# Patient Record
Sex: Female | Born: 1950 | Race: White | Hispanic: No | Marital: Single | Smoking: Current some day smoker
Health system: Southern US, Community
[De-identification: ages and names within clinical notes are randomized; demographics above are authoritative.]

## PROBLEM LIST (undated history)

## (undated) DIAGNOSIS — K819 Cholecystitis, unspecified: Secondary | ICD-10-CM

## (undated) DIAGNOSIS — J189 Pneumonia, unspecified organism: Secondary | ICD-10-CM

## (undated) DIAGNOSIS — I739 Peripheral vascular disease, unspecified: Secondary | ICD-10-CM

## (undated) DIAGNOSIS — N390 Urinary tract infection, site not specified: Secondary | ICD-10-CM

## (undated) DIAGNOSIS — I499 Cardiac arrhythmia, unspecified: Secondary | ICD-10-CM

## (undated) DIAGNOSIS — Z923 Personal history of irradiation: Secondary | ICD-10-CM

## (undated) DIAGNOSIS — M1990 Unspecified osteoarthritis, unspecified site: Secondary | ICD-10-CM

## (undated) DIAGNOSIS — R0600 Dyspnea, unspecified: Secondary | ICD-10-CM

## (undated) DIAGNOSIS — K219 Gastro-esophageal reflux disease without esophagitis: Secondary | ICD-10-CM

## (undated) DIAGNOSIS — B019 Varicella without complication: Secondary | ICD-10-CM

## (undated) DIAGNOSIS — E785 Hyperlipidemia, unspecified: Secondary | ICD-10-CM

## (undated) DIAGNOSIS — E669 Obesity, unspecified: Secondary | ICD-10-CM

## (undated) DIAGNOSIS — Z170 Estrogen receptor positive status [ER+]: Principal | ICD-10-CM

## (undated) DIAGNOSIS — F329 Major depressive disorder, single episode, unspecified: Secondary | ICD-10-CM

## (undated) DIAGNOSIS — F32A Depression, unspecified: Secondary | ICD-10-CM

## (undated) DIAGNOSIS — C50412 Malignant neoplasm of upper-outer quadrant of left female breast: Principal | ICD-10-CM

## (undated) DIAGNOSIS — R319 Hematuria, unspecified: Secondary | ICD-10-CM

## (undated) HISTORY — DX: Urinary tract infection, site not specified: R31.9

## (undated) HISTORY — DX: Urinary tract infection, site not specified: N39.0

## (undated) HISTORY — PX: KNEE SURGERY: SHX244

## (undated) HISTORY — PX: OVARIAN CYST REMOVAL: SHX89

## (undated) HISTORY — PX: ABDOMINAL HYSTERECTOMY: SHX81

## (undated) HISTORY — DX: Pneumonia, unspecified organism: J18.9

## (undated) HISTORY — DX: Varicella without complication: B01.9

## (undated) HISTORY — DX: Personal history of irradiation: Z92.3

## (undated) HISTORY — PX: WRIST SURGERY: SHX841

## (undated) HISTORY — PX: TONSILLECTOMY: SUR1361

## (undated) HISTORY — DX: Depression, unspecified: F32.A

## (undated) HISTORY — DX: Peripheral vascular disease, unspecified: I73.9

## (undated) HISTORY — DX: Malignant neoplasm of upper-outer quadrant of left female breast: C50.412

## (undated) HISTORY — DX: Obesity, unspecified: E66.9

## (undated) HISTORY — DX: Estrogen receptor positive status (ER+): Z17.0

## (undated) HISTORY — PX: KNEE ARTHROSCOPY: SHX127

## (undated) HISTORY — DX: Major depressive disorder, single episode, unspecified: F32.9

## (undated) HISTORY — DX: Cholecystitis, unspecified: K81.9

## (undated) HISTORY — PX: ROTATOR CUFF REPAIR: SHX139

## (undated) NOTE — *Deleted (*Deleted)
[facility]  [phone] Fax:(336) [phone]     ID: Marcey Persad DOB: [birth year]  MR#: [id number]  ZHY#:[id number]  Patient Care Team: Theadore Nan, NP as PCP - General (Nurse Practitioner) Emelia Loron, MD as Consulting Physician (General Surgery) Magrinat, Valentino Hue, MD as Consulting Physician (Oncology) Antony Blackbird, MD as Consulting Physician (Radiation Oncology) Althea Charon, MD (Radiology) Armbruster, Willaim Rayas, MD as Consulting Physician (Gastroenterology) Yates Decamp, MD as Consulting Physician (Cardiology) Frederico Hamman, MD as Consulting Physician (Orthopedic Surgery) Oretha Milch, MD as Consulting Physician (Pulmonary Disease) Lourena Simmonds, RPH-CPP (Pharmacist) Kari Baars OTHER MD:  CHIEF COMPLAINT: Estrogen receptor positive breast cancer  CURRENT TREATMENT: Anastrozole   INTERVAL HISTORY: Fara returns today for follow-up of her estrogen receptor positive breast cancer accompanied.  She continues on anastrozole, with good tolerance.  Currently she is spending less than $20 for a 40-month supply.  She does not have problems with hot flashes or vaginal dryness issues.  She does have some urethral irritation and some urinary tract problems which have been taking care of with a couple of rounds of antibiotics through her primary care physician  Since her last visit, she does not appear to have had further mammographic studies (we contacted [facility] and the last data they have for her is from 2018).   REVIEW OF SYSTEMS: Mariena    BREAST CANCER HISTORY: From the original intake note:  Lumen had routine screening mammography at [facility] 09/17/2016. The breast density was category A. At the 12:00 location anteriorly in the left breast there was an area of linear calcifications measuring 0.3 cm. There was also an area of architectural distortion in the left breast upper outer quadrant. On 09/23/2016 the  patient underwent left diagnostic mammography and this confirmed an area of calcifications and architectural distortion. Accordingly on 09/24/2016 the patient underwent left breast ultrasonography which did not show any abnormality in the left breast or left axilla.  On 09/24/2016 the patient underwent biopsy of the left breast area of architectural distortion and this showed ([id number]) invasive ductal carcinoma, grade 1, estrogen receptor 100% positive, and progesterone receptor 100% positive, both with strong staining intensity, with an MIB-1 of 5%, and no HER-2 amplification, the signals ratio being 1.51 and the number per cell 2.65.  The patient's subsequent history is as detailed below.   PAST MEDICAL HISTORY: Past Medical History:  Diagnosis Date  . Acid reflux   . Acute heart failure (HCC) 10/17/2019  . AKI (acute kidney injury) (HCC) 10/17/2019  . Arthritis   . Breast cancer of upper-outer quadrant of right female breast (HCC) 11/19/2016  . Chicken pox   . Cholecystitis   . Chronic diastolic heart failure (HCC) 01/04/2020  . CKD (chronic kidney disease), stage III (HCC) 01/04/2020  . Depression   . Dyspnea   . History of radiation therapy 01/20/17-03/02/17   left breast was treated to 50.4 Gy in 18 fractions  . HLD (hyperlipidemia) 01/04/2020  . Hyperlipemia   . Irregular heart rate   . Malignant neoplasm of upper-outer quadrant of left breast in female, estrogen receptor positive (HCC) 09/30/2016  . Morbid obesity with BMI of 50.0-59.9, adult (HCC) 01/04/2020  . Obesity   . Peripheral vascular disease (HCC)   . Pneumonia   . Prediabetes 01/04/2020  . Urinary tract infection   . Urinary tract infection with hematuria     PAST SURGICAL HISTORY: Past Surgical History:  Procedure Laterality Date  . ABDOMINAL HYSTERECTOMY    .  BREAST LUMPECTOMY Left 11/19/2016   BREAST LUMPECTOMY WITH RADIOACTIVE SEED AND SENTINEL LYMPH NODE BIOPSY (Left)  . BREAST LUMPECTOMY WITH RADIOACTIVE SEED  AND SENTINEL LYMPH NODE BIOPSY Left 11/19/2016   Procedure: BREAST LUMPECTOMY WITH RADIOACTIVE SEED AND SENTINEL LYMPH NODE BIOPSY;  Surgeon: Emelia Loron, MD;  Location: [facility] OR;  Service: General;  Laterality: Left;  . CESAREAN SECTION    . CESAREAN SECTION     x 2  . CHOLECYSTECTOMY  2007  . KNEE ARTHROSCOPY     bilateral  . KNEE SURGERY Bilateral   . LOWER EXTREMITY ANGIOGRAM N/A 04/04/2014   Procedure: LOWER EXTREMITY ANGIOGRAM;  Surgeon: Pamella Pert, MD;  Location: [facility] CATH LAB;  Service: Cardiovascular;  Laterality: N/A;  . OVARIAN CYST REMOVAL    . RE-EXCISION OF BREAST LUMPECTOMY Left 12/12/2016   Procedure: RE-EXCISION OF LEFT BREAST LUMPECTOMY;  Surgeon: Emelia Loron, MD;  Location: [facility] OR;  Service: General;  Laterality: Left;  . ROTATOR CUFF REPAIR Right   . TONSILLECTOMY    . WRIST SURGERY Bilateral     FAMILY HISTORY Family History  Problem Relation Age of Onset  . Heart disease Mother   . Lung cancer Father   . Heart disease Father   . Heart disease Brother   The patient's father died at age 5 from lung cancer in the setting of tobacco abuse. The patient's mother died at the age of 55 from pneumonia. The patient has 3 brothers, 1 sister. There is no history of breast or ovarian cancer in the family.   GYNECOLOGIC HISTORY:  No LMP recorded. Patient has had a hysterectomy. Menarche age 77, first live birth age 61. The patient is GX P2. She underwent simple hysterectomy without salpingo-oophorectomy in 1986. She did not take hormone replacement. She did use oral contraceptives remotely for about 5 years, with no complications.   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 She now lives at home by herself along with her dog Dickie La. Her daughter Myer Peer worked as a Radiation protection practitioner for 30 years but retired 2020 and now works for [employer] in [place]. Daughter Raynelle Fanning lives in [place] and works as a Research scientist (medical). The  patient has no grandchildren. She is a Control and instrumentation engineer.    ADVANCED DIRECTIVES: Not in place. The patient was given the appropriate documents to complete and notarize at her discretion in course of her 10/01/2016 visit   HEALTH MAINTENANCE: Social History   Tobacco Use  . Smoking status: Former Smoker    Packs/day: 0.50    Years: 30.00    Pack years: 15.00    Types: E-cigarettes    Quit date: 11/02/2019    Years since quitting: 0.4  . Smokeless tobacco: Never Used  Vaping Use  . Vaping Use: Never used  Substance Use Topics  . Alcohol use: No  . Drug use: No     Colonoscopy:Never  PAP:  Bone density: Never   Allergies  Allergen Reactions  . Levaquin [Levofloxacin] Other (See Comments)    Body aches  . Sulfur Hives  . Statins     Current Outpatient Medications  Medication Sig Dispense Refill  . acetaminophen (TYLENOL) 325 MG tablet Take 2 tablets (650 mg total) by mouth every 4 (four) hours as needed for headache or mild pain.    Marland Kitchen albuterol (PROAIR HFA) 108 (90 Base) MCG/ACT inhaler Inhale 2 puffs into the lungs every 6 (six) hours as needed for wheezing or shortness of  breath. 18 g 3  . anastrozole (ARIMIDEX) 1 MG tablet TAKE 1 TABLET BY MOUTH EVERY DAY 90 tablet 0  . aspirin 81 MG chewable tablet Chew 81 mg by mouth daily.    . Bempedoic Acid-Ezetimibe (NEXLIZET) 180-10 MG TABS Take 1 tablet by mouth daily. 30 tablet 6  . Blood Glucose Monitoring Suppl (ACCU-CHEK GUIDE) w/Device KIT     . cephALEXin (KEFLEX) 500 MG capsule Take 1 capsule (500 mg total) by mouth 2 (two) times daily for 7 days. 14 capsule 0  . cilostazol (PLETAL) 100 MG tablet Take 1 tablet (100 mg total) by mouth 2 (two) times daily. 180 tablet 3  . diclofenac sodium (VOLTAREN) 1 % GEL Apply 4 g topically 4 (four) times daily as needed (knee and back pain). Knees and back for pain  5  . metoprolol succinate (TOPROL-XL) 25 MG 24 hr tablet Take 0.5 tablets (12.5 mg total) by mouth daily. 90 tablet 0  . Multiple  Vitamins-Minerals (PRESERVISION AREDS 2 PO) Take 1 tablet by mouth daily.     . pantoprazole (PROTONIX) 20 MG tablet Take 1 tablet (20 mg total) by mouth daily. 90 tablet 0  . potassium chloride SA (KLOR-CON M20) 20 MEQ tablet Take 1 tablet (20 mEq total) by mouth daily. 90 tablet 0  . torsemide (DEMADEX) 10 MG tablet TAKE 1 TABLET BY MOUTH TWICE A DAY 60 tablet 1  . vitamin B-12 (CYANOCOBALAMIN) 500 MCG tablet Take by mouth.     No current facility-administered medications for this visit.    OBJECTIVE: Morbidly obese white woman examined in a wheelchair  There were no vitals filed for this visit.   There is no height or weight on file to calculate BMI.    ECOG FS:2 - Symptomatic, <50% confined to bed  Sclerae unicteric, EOMs intact Wearing a mask No cervical or supraclavicular adenopathy Lungs no rales or rhonchi Heart regular rate and rhythm Abd soft, nontender, positive bowel sounds MSK no focal spinal tenderness, no upper extremity lymphedema Neuro: nonfocal, well oriented, appropriate affect Breasts:    {Sclerae unicteric, EOMs intact Wearing a mask No cervical or supraclavicular adenopathy Lungs no rales or rhonchi Heart regular rate and rhythm Abd soft, nontender, positive bowel sounds MSK no focal spinal tenderness, no upper extremity lymphedema Neuro: nonfocal, well oriented, appropriate affect Breasts: The right breast is unremarkable.  The left breast has undergone lumpectomy and radiation.  There is a minimal dimpling at the site of the scar, but there is no tenderness erythema or swelling.  There is no evidence of local recurrence.  Both axillae are benign.}   LAB RESULTS:  CMP     Component Value Date/Time   NA 143 02/14/2020 1024   NA 137 02/27/2017 1007   K 4.8 02/14/2020 1024   K 4.4 02/27/2017 1007   CL 102 02/14/2020 1024   CL 105 04/08/2014 0355   CO2 25 02/14/2020 1024   CO2 32 (H) 02/27/2017 1007   GLUCOSE 96 02/14/2020 1024   GLUCOSE 107 (H)  01/06/2020 0839   GLUCOSE 127 02/27/2017 1007   BUN 17 02/14/2020 1024   BUN 7.7 02/27/2017 1007   CREATININE 1.45 (H) 02/14/2020 1024   CREATININE 0.9 02/27/2017 1007   CALCIUM 9.6 02/14/2020 1024   CALCIUM 10.2 02/27/2017 1007   PROT 6.9 01/06/2020 0839   PROT 7.0 02/27/2017 1007   ALBUMIN 3.8 01/06/2020 0839   ALBUMIN 3.3 (L) 02/27/2017 1007   AST 12 01/06/2020 0839   AST  14 02/27/2017 1007   ALT 11 01/06/2020 0839   ALT 9 02/27/2017 1007   ALKPHOS 96 01/06/2020 0839   ALKPHOS 78 02/27/2017 1007   BILITOT 0.6 01/06/2020 0839   BILITOT 0.60 02/27/2017 1007   GFRNONAA 37 (L) 02/14/2020 1024   GFRNONAA 56 (L) 04/08/2014 0355   GFRAA 42 (L) 02/14/2020 1024   GFRAA >60 04/08/2014 0355    No results found for: TOTALPROTELP, ALBUMINELP, A1GS, A2GS, BETS, BETA2SER, GAMS, MSPIKE, SPEI  No results found for: Ron Parker, [facility]  Lab Results  Component Value Date   WBC 7.0 01/06/2020   NEUTROABS 4.2 01/06/2020   HGB 13.0 01/06/2020   HCT 39.3 01/06/2020   MCV 92.8 01/06/2020   PLT 246.0 01/06/2020      Chemistry      Component Value Date/Time   NA 143 02/14/2020 1024   NA 137 02/27/2017 1007   K 4.8 02/14/2020 1024   K 4.4 02/27/2017 1007   CL 102 02/14/2020 1024   CL 105 04/08/2014 0355   CO2 25 02/14/2020 1024   CO2 32 (H) 02/27/2017 1007   BUN 17 02/14/2020 1024   BUN 7.7 02/27/2017 1007   CREATININE 1.45 (H) 02/14/2020 1024   CREATININE 0.9 02/27/2017 1007      Component Value Date/Time   CALCIUM 9.6 02/14/2020 1024   CALCIUM 10.2 02/27/2017 1007   ALKPHOS 96 01/06/2020 0839   ALKPHOS 78 02/27/2017 1007   AST 12 01/06/2020 0839   AST 14 02/27/2017 1007   ALT 11 01/06/2020 0839   ALT 9 02/27/2017 1007   BILITOT 0.6 01/06/2020 0839   BILITOT 0.60 02/27/2017 1007     No results found for: LABCA2  No components found for: [id number]  No results for input(s): INR in the last 168 hours.  Urinalysis    Component Value Date/Time    COLORURINE YELLOW 10/17/2019 1429   APPEARANCEUR CLOUDY (A) 10/17/2019 1429   APPEARANCEUR Cloudy 04/07/2014 2005   LABSPEC 1.010 10/17/2019 1429   LABSPEC 1.006 04/07/2014 2005   PHURINE 6.0 10/17/2019 1429   GLUCOSEU NEGATIVE 10/17/2019 1429   GLUCOSEU Negative 04/07/2014 2005   HGBUR SMALL (A) 10/17/2019 1429   BILIRUBINUR neg 03/26/2020 1109   BILIRUBINUR Negative 04/07/2014 2005   KETONESUR NEGATIVE 10/17/2019 1429   PROTEINUR Negative 03/26/2020 1109   PROTEINUR NEGATIVE 10/17/2019 1429   UROBILINOGEN 2.0 (A) 03/26/2020 1109   NITRITE pos 03/26/2020 1109   NITRITE POSITIVE (A) 10/17/2019 1429   LEUKOCYTESUR Moderate (2+) (A) 03/26/2020 1109   LEUKOCYTESUR LARGE (A) 10/17/2019 1429   LEUKOCYTESUR 3+ 04/07/2014 2005    STUDIES: US RENAL  Result Date: 03/27/2020 CLINICAL DATA:  CKD stage 3 EXAM: RENAL / URINARY TRACT ULTRASOUND COMPLETE COMPARISON:  CT abdomen pelvis 03/14/2016 FINDINGS: Right Kidney: Renal measurements: 10.7 x 4.1 x 5.2 cm = volume: 121 mL. Echogenicity within normal limits. No mass or hydronephrosis visualized. Left Kidney: Renal measurements: 10.0 x 3.5 x 4.1 cm = volume: 75 mL. Echogenicity within normal limits. No mass or hydronephrosis visualized. Bladder: Limited evaluation due to under distension. Other: None. IMPRESSION: Unremarkable sonographic appearance of the bilateral kidneys. Electronically Signed   By: Emmaline Kluver M.D.   On: 03/27/2020 08:36       ELIGIBLE FOR AVAILABLE RESEARCH PROTOCOL: no  ASSESSMENT: 18 y.o. [place], [place] woman Status post left breast upper outer quadrant biopsy 09/24/2016 of an area of architectural distortion measuring 3.7 cm, showing invasive ductal carcinoma, grade 1, estrogen and progesterone receptor positive,  HER-2 nonamplified, with an MIB-1 of 5%.  (a) a 0.3 cm area of linear calcifications in the superior aspect of the breast Was benign on biopsy 10/02/2016   (1) left lumpectomy and sentinel lymph node  sampling 11/19/2016 found ([id number]) invasive ductal carcinoma, measuring 1.9 cm, grade 2, with all 3 sentinel lymph nodes clear. However the medial margin was involved  (a) additional surgery 12/12/2016 cleared the involved margin (as CA [id number]  (2) Oncotype DX  score of 14 predicted a 10 year risk of outside the breast recurrence of 9% if the patient's only systemic therapy is tamoxifen for 5 years. It also predicted no benefit from chemotherapy.  (3) adjuvant radiation 01/20/2017 to 03/02/2017 Site/dose:   The Left breast was treated to 50.4 Gy in 18 fractions of 1.8 Gy.   (4) anastrozole started April 2018  (5) tobacco abuse: The patient has been strongly advised to discontinue smoking at this point   PLAN: Maude is now just about 3 years out from definitive surgery for her breast cancer with no evidence of disease recurrence.  This is very favorable.  She is tolerating anastrozole well and the plan is to continue that a minimum of 5 years.  I have encouraged her to become a bit more active.  I commended her getting the vaccine as soon as she could obtain it.  She will be seeing Dr. Jacinto Halim in December.  She sees her primary care physician very variably.  I am tentatively going to put her down to see me in June after her May mammogram but if she establishes herself with a primary care physician around that time I could certainly see her a year from now.  Total encounter time 25 minutes.*   Magrinat, Valentino Hue, MD  04/01/20 12:34 PM Medical Oncology and Hematology [facility] [address] [place], [place] [address] Tel. [phone]    Fax. [phone]    I, Mickie Bail, am acting as scribe for Dr. Valentino Hue. Magrinat.  I, Ruthann Cancer MD, have reviewed the above documentation for accuracy and completeness, and I agree with the above.   *Total Encounter Time as defined by the Centers for Medicare and Medicaid Services includes, in addition to the  face-to-face time of a patient visit (documented in the note above) non-face-to-face time: obtaining and reviewing outside history, ordering and reviewing medications, tests or procedures, care coordination (communications with other health care professionals or caregivers) and documentation in the medical record.

---

## 1999-01-13 ENCOUNTER — Encounter: Payer: Self-pay | Admitting: Emergency Medicine

## 1999-01-13 ENCOUNTER — Emergency Department (HOSPITAL_COMMUNITY): Admission: EM | Admit: 1999-01-13 | Discharge: 1999-01-13 | Payer: Self-pay | Admitting: Emergency Medicine

## 1999-01-21 ENCOUNTER — Encounter (INDEPENDENT_AMBULATORY_CARE_PROVIDER_SITE_OTHER): Payer: Self-pay | Admitting: Specialist

## 1999-01-21 ENCOUNTER — Inpatient Hospital Stay (HOSPITAL_COMMUNITY): Admission: RE | Admit: 1999-01-21 | Discharge: 1999-01-24 | Payer: Self-pay | Admitting: General Surgery

## 1999-01-21 ENCOUNTER — Encounter: Payer: Self-pay | Admitting: General Surgery

## 2001-01-28 ENCOUNTER — Emergency Department (HOSPITAL_COMMUNITY): Admission: EM | Admit: 2001-01-28 | Discharge: 2001-01-28 | Payer: Self-pay | Admitting: Emergency Medicine

## 2002-02-19 ENCOUNTER — Emergency Department (HOSPITAL_COMMUNITY): Admission: EM | Admit: 2002-02-19 | Discharge: 2002-02-19 | Payer: Self-pay | Admitting: Emergency Medicine

## 2002-09-27 ENCOUNTER — Emergency Department (HOSPITAL_COMMUNITY): Admission: EM | Admit: 2002-09-27 | Discharge: 2002-09-27 | Payer: Self-pay

## 2003-03-04 ENCOUNTER — Emergency Department (HOSPITAL_COMMUNITY): Admission: EM | Admit: 2003-03-04 | Discharge: 2003-03-05 | Payer: Self-pay | Admitting: Emergency Medicine

## 2005-02-02 ENCOUNTER — Ambulatory Visit: Payer: Self-pay | Admitting: Cardiology

## 2005-02-03 ENCOUNTER — Observation Stay (HOSPITAL_COMMUNITY): Admission: EM | Admit: 2005-02-03 | Discharge: 2005-02-05 | Payer: Self-pay | Admitting: Emergency Medicine

## 2005-06-16 HISTORY — PX: CHOLECYSTECTOMY: SHX55

## 2007-09-23 ENCOUNTER — Encounter: Admission: RE | Admit: 2007-09-23 | Discharge: 2007-09-23 | Payer: Self-pay | Admitting: Orthopedic Surgery

## 2008-09-02 ENCOUNTER — Encounter: Admission: RE | Admit: 2008-09-02 | Discharge: 2008-09-02 | Payer: Self-pay | Admitting: Orthopedic Surgery

## 2010-10-16 ENCOUNTER — Emergency Department (HOSPITAL_COMMUNITY)
Admission: EM | Admit: 2010-10-16 | Discharge: 2010-10-16 | Disposition: A | Discharge status: Home / Self Care | Payer: Medicare Other | Attending: Emergency Medicine | Admitting: Emergency Medicine

## 2010-10-16 DIAGNOSIS — S90569A Insect bite (nonvenomous), unspecified ankle, initial encounter: Secondary | ICD-10-CM | POA: Insufficient documentation

## 2010-10-16 DIAGNOSIS — IMO0002 Reserved for concepts with insufficient information to code with codable children: Secondary | ICD-10-CM | POA: Insufficient documentation

## 2010-10-16 DIAGNOSIS — Z79899 Other long term (current) drug therapy: Secondary | ICD-10-CM | POA: Insufficient documentation

## 2010-10-16 DIAGNOSIS — Z8739 Personal history of other diseases of the musculoskeletal system and connective tissue: Secondary | ICD-10-CM | POA: Insufficient documentation

## 2010-11-01 NOTE — H&P (Signed)
NAMEAMARIS, Daisy [name] NO.:  [id number]   MEDICAL RECORD NO.:  [id number]          PATIENT TYPE:  INP   LOCATION:  1828                         FACILITY:  [facility]   PHYSICIAN:  Lorain Childes, M.D. LHCDATE OF BIRTH:  Jan 23, 1951   DATE OF ADMISSION:  02/02/2005  DATE OF DISCHARGE:                                HISTORY & PHYSICAL   PRIMARY CARE PHYSICIAN:  [facility].   CHIEF COMPLAINT:  Chest pain.   HISTORY OF PRESENT ILLNESS:  [name] is a very pleasant 60 year old  female with no known history of coronary disease with cardiac risk factors  of hyperlipidemia, tobacco and family history who presents to the emergency  room with chest pain for the past few days.  The patient reports that the  chest pain began after lifting a carton of bottle of water and she rated the  pain 3/10 and it has been off and on since then.  She does have dyspnea on  exertion and she reports mild shortness of breath.  No nausea or vomiting.  She does report the pain radiating to her left shoulder.  She originally  thought she pulled a muscle in her chest and breast but as this did not  improve she came in for further evaluation.  Currently, she reports very  minimal chest tightness and feels that it is mostly bronchial in nature.  In  the ER, cardiac enzymes were negative x3, EKG was unchanged.  Her EKG did  not show any acute ischemic changes and was unchanged from prior EKGs.  We  were then called for further evaluation.   PAST MEDICAL HISTORY:  1.  Gastroesophageal reflux disease, she is on Aciphex and she has been on      this for the couple of years.  2.  Hyperlipidemia, she previously was on Crestor 10 mg daily which did      improve her total cholesterol to 186, however, she developed bad leg      pain with the Crestor so stopped in 3-4 months ago.  3.  Degenerative disk disease.  4.  Tobacco, actively smoking one and a half packs per day.  5.  Obesity.   MEDICATIONS:  1.  Aciphex one tablet p.o. daily.  2.  Vicodin p.r.n.   ALLERGIES:  SULFA.   SOCIAL HISTORY:  She lives in [place], she is divorced, she has adult  children.  She has a 35-pack year history of tobacco, actively smoking.  No  alcohol, no drugs, no opioid medications.  She follows a regular diet.  She  does not do any regular exercise.   FAMILY HISTORY:  Her mother has a pacemaker, she has had it for 15 or 20  years.  Father had an MI at the age of 50.  Her brother had a CABG and valve  replacement in his late 51s.   REVIEW OF SYSTEMS:  She denies any fever or chills.  No weight changes.  No  headaches or visual changes.  No skin rashes or lesions.  She does report  chest pain and dyspnea  on exertion as described in the HPI.  She denies  orthopnea, PND, or lower extremity edema.  She reports occasional  palpitations.  No syncope.  Occasional coughing and occasional wheezing.  She denies any urinary symptoms, no hematuria, no dysuria and no urinary  frequency.  She does report arthralgias in her back, hips and knees.  She  did have myalgias when she was on the statin but none since then.  GI:  No  nausea or vomiting, no diarrhea, no bright red blood per rectum, no melena,  no hematemesis.  She has GERD which is fairly well-controlled on her  Aciphex.  No change in her bowel habits.  All other systems are negative.   PHYSICAL EXAMINATION:  VITAL SIGNS:  Temperature 97.4, pulse 90,  respirations 18, blood pressure 132/57, saturating 93% on room air.  GENERAL:  She is obese and pleasant and in no acute distress.  HEENT:  Normocephalic, atraumatic.  Oropharynx is clear.  NECK:  Supple, no carotid bruits, she has 2+ carotid upstrokes. JVP is  unable to be assessed due to body habitus.  No lymphadenopathy.  CARDIOVASCULAR:  Normal S1 and S2, regular rate and rhythm, there are no  murmurs appreciated.  Her pulses are 2+ throughout without any bruits.  She  has no femoral  bruits.  LUNGS:  Clear to auscultation bilaterally but decreased breath sounds at the  bases.  ABDOMEN:  Soft, positive bowel sounds, nontender, obese, no organomegaly.  EXTREMITIES:  No edema, 2+ distal pulses.  NEUROLOGIC:  She is alert and oriented x3, cranial nerves II-XII are grossly  intact.  Strength is appropriate, 5/5 throughout.   Chest x-ray shows cardiomegaly, no edema, mild peribronchial thickening.  EKG shows a rate of 90, sinus rhythm, axis is normal, PR intervals  __________ QRS is 70 mS and QTC is 428 mS.  She has no Q waves, she has  delayed R wave changes and no hypertrophy.  There is no change from her  prior EKG dated March 04, 2003.   LABORATORY DATA:  White count 8.1, hematocrit 42.2, platelets 187,000.  Potassium 4.0, creatinine 0.8, glucose 101, total bilirubin 0.3, AST 20, ALT  23, alkaline phosphatase 57.  Cardiac enzymes:  CK-MB less than 1, then  repeat is 1.2, then repeat is 1.2.  Troponin is less than 0.05 x3.  PTT is  28, PT is 12.3.   ASSESSMENT AND PLAN:  The patient is a 60 year old female who presents with  chest pain with typical and atypical features and also cardiac risk factors  of hypertension, tobacco abuse, family history and obesity.   1.  Coronary artery disease.  We will do another set of cardiac enzymes and      follow-up EKG with a stress test.  We would prefer a treadmill and      Myoview.  I will make her n.p.o. for the studies.  I will place her on      an aspirin and nitro paste.  We will check her lipids.  I strongly      encouraged smoking cessation and we will monitor her closely.  2.  Hyperlipidemia.  She previously was on Crestor but she developed      myalgias.  We will follow up on her lipids and likely start her back on      a statin but a different agent at a low dose with close follow-up.  3.  Tobacco abuse.  She says that she is willing to quit.  We will provide     her with cessation education during this  hospitalization.  4.  Obesity.  After her workup for CAD as described in #1, she needs an      intensive diet and exercise program.           ______________________________  Lorain Childes, M.D. [facility]     CGF/MEDQ  D:  02/03/2005  T:  02/03/2005  Job:  [id number]   cc:   [facility]

## 2010-11-01 NOTE — Discharge Summary (Signed)
NAMECHESSA, Daisy [name]               ACCOUNT NO.:  [id number]   MEDICAL RECORD NO.:  [id number]          PATIENT TYPE:  INP   LOCATION:  6532                         FACILITY:  [facility]   PHYSICIAN:  Arvilla Meres, M.D. LHCDATE OF BIRTH:  [birth year]   DATE OF ADMISSION:  02/02/2005  DATE OF DISCHARGE:                                 DISCHARGE SUMMARY   ADDENDUM:  The patient has accepted a prescription for Lipitor 10 mg  nightly.  We have recommended that she give this a 2-week trial to see if  she tolerates it.  In the meantime, she can follow up with Dr. Delanna Notice.  If  she does not tolerate Lipitor, I would recommend trying an alternate statin  and if none of the statins are tolerated, then try Zetia.  The patient is  agreeable to this plan and also states that she views this as a second  chance and is committed to quitting smoking.      Ok Anis, NP      Arvilla Meres, M.D. [facility]  Electronically Signed    CRB/MEDQ  D:  02/05/2005  T:  02/06/2005  Job:  [id number]

## 2010-11-01 NOTE — Cardiovascular Report (Signed)
[name], MAHN NO.:  [id number]   MEDICAL RECORD NO.:  [id number]          PATIENT TYPE:  OBV   LOCATION:  6532                         FACILITY:  [facility]   PHYSICIAN:  Arvilla Meres, M.D. LHCDATE OF BIRTH:  [birth year]   DATE OF PROCEDURE:  02/05/2005  DATE OF DISCHARGE:  02/05/2005                              CARDIAC CATHETERIZATION   The second and then some dictating on cardiac catheterization report on this  and a [facility] medical number is [id number]. The procedures February 05, 2005.   PRIMARY CARE PHYSICIAN:  Dr. Barbee Shropshire at [facility].   CARDIOLOGIST:  Dr. Charlton Haws.   PATIENT IDENTIFICATION:  [name] is a delightful 60 year old woman, with  multiple cardiac risk factors including morbid obesity, hypertension,  hyperlipidemia, and tobacco use, who was admitted with a three-day history  of chest pain. This had both typical and atypical features. She underwent a  stress Myoview which suggested possible anterior apical reversible defect  and was thus referred for cardiac catheterization.   PROCEDURES PERFORMED:  1.  Selective coronary angiography.  2.  Left heart catheterization.  3.  Left ventriculogram.  4.  Angio-Seal femoral artery closure.   DESCRIPTION OF PROCEDURE:  The risks and benefits of cardiac catheterization  were explained to Ms. Bulson; consent was signed and placed on the chart. A  6-French arterial sheath was placed in the right femoral artery using  anterior puncture and a modified Seldinger technique. Standard catheters  including a JL-4, JR-4 and angled pigtail were used for the procedure. All  catheter exchanges were made over wire. There were no apparent  complications. At the end of procedure, the patient underwent angiography of  her right femoral artery which showed suitable placement for Angio-Seal  closure device and thus the right femoral artery was sealed with the Angio-  Seal device. There was  good hemostasis. The patient was transferred to the  holding area for continued observation.   FINDINGS:  Central aortic pressure was 148/80 with a mean of 109. LV  pressure is 156/11 with EDP of 18.   Left main was normal.   LAD was a long vessel coursing to the apex. It gave off two small diagonals.  There was tubular 20-30% lesion in the proximal LAD. Otherwise there was no  evidence of angiographic coronary disease.   Left circumflex was a large vessel. It gave off a large OM1 and a large OM2.  The distal AV groove circumflex was small. There is no angiographic CAD.   Right coronary was a large dominant vessel and gave off a moderate-sized PDA  and two small PLs. There was no angiographic CAD. Left ventriculogram done  in the RAO approach showed an EF of approximately 65%. There was no evidence  of wall motion abnormalities or mitral regurgitation.   ASSESSMENT:  1.  Minimal nonobstructive coronary disease.  2.  Normal left ventricular function.   The plan will be to proceed with risk factor modification and she will  likely be able to be discharged home today if her groin remains stable.  Arvilla Meres, M.D. [facility]  Electronically Signed     DB/MEDQ  D:  02/05/2005  T:  02/05/2005  Job:  [id number]   cc:   Olene Craven, M.D.  [address]  [address]  [place]  [place] [address]  Fax: [phone]

## 2010-11-01 NOTE — Discharge Summary (Signed)
Daisy [name], Daisy [name]               ACCOUNT NO.:  [id number]   MEDICAL RECORD NO.:  [id number]          PATIENT TYPE:  INP   LOCATION:  6532                         FACILITY:  [facility]   PHYSICIAN:  Arvilla Meres, M.D. LHCDATE OF BIRTH:  Oct 13, 1950   DATE OF ADMISSION:  02/02/2005  DATE OF DISCHARGE:  02/05/2005                                 DISCHARGE SUMMARY   PRINCIPAL DIAGNOSIS:  Chest pain.   OTHER DIAGNOSES:  1.  Gastroesophageal reflux disease.  2.  Hyperlipidemia.  3.  Degenerative disk disease.  4.  Ongoing tobacco abuse.  5.  Obesity.   ALLERGIES:  SULFA; CRESTOR causes leg aching.   PROCEDURES:  Left heart cardiac catheterization.   HISTORY OF PRESENT ILLNESS:  [name] with no prior  history of CAD, but risk factors including hyperlipidemia, tobacco abuse and  family history, who presented to the [facility] ED on February 02, 2005 with  about a 3-day history of intermittent 3/10 substernal chest discomfort  radiating to the left shoulder associated with shortness of breath that  initially occurred after carrying a carton of bottled water.  She initially  thought that she pulled a muscle, but when her symptoms improved, she  decided to present to the [facility] ED.  In the ED, she ruled out for MI by  cardiac enzymes and her ECG was normal and she was admitted to [facility] for further evaluation.   HOSPITAL COURSE:  The patient underwent an adenosine Myoview on February 04, 2005 which showed an EF at greater than 60% with moderate anteroapical  defect with partial reversibility as well as ___________ the patient's risk  factors, she was set up for left heart cardiac catheterization, which took  place on the morning of February 05, 2005, revealing a normal left main, 20%  to 30% stenosis in the proximal LAD, normal circumflex, normal RCA and an EF  of 65% without wall motion abnormalities.  Post catheterization, she has  been  ambulating without difficulty or recurrent symptoms, being discharged  home today in satisfactory condition.   DISCHARGE LABORATORIES:  Hemoglobin 14.1, hematocrit 42.2, WBC 8.1,  platelets 187,000, MCV 91.6, neutrophils 63.  Sodium 143, potassium 4.2,  chloride 106, CO2 30, BUN 8, creatinine 0.8, glucose 104.  PT 12.3, INR 0.9,  PTT 28.  Total bilirubin 0.6, alkaline phosphatase 70, AST 22, ALT 23,  albumin 3.3.  Cardiac enzymes negative x3.  Total cholesterol 289,  triglycerides 133, HDL 52, LDL 210.  Calcium 9.0.  BNP less than 30.  TSH  1.034.   DISPOSITION:  The patient is being discharged home in good condition.   FOLLOWUP PLANS AND APPOINTMENT:  She was asked to follow up with her primary  care physician, Dr. Barbee Shropshire, in 1-2 weeks.   CURRENT MEDICATIONS:  1.  Aspirin 81 mg daily.  2.  Aciphex 20 mg daily.  3.  The patient has been advised that a statin should be reinitiated, given      her significant hyperlipidemia and mild CAD noted on cath, and she will  consider this, as she has had leg aches with Crestor in the past.   OUTSTANDING LABORATORY STUDIES:  None.   DURATION OF DISCHARGE ENCOUNTER:  Forty minutes.      Ok Anis, NP      Arvilla Meres, M.D. [facility]  Electronically Signed    CRB/MEDQ  D:  02/05/2005  T:  02/06/2005  Job:  [id number]   cc:   Olene Craven, M.D.  [address]  [address]  [place]  [place] [address]  Fax: [phone]

## 2011-05-25 ENCOUNTER — Emergency Department (HOSPITAL_COMMUNITY)
Admission: EM | Admit: 2011-05-25 | Discharge: 2011-05-25 | Disposition: A | Discharge status: Home / Self Care | Payer: Medicare Other | Attending: Emergency Medicine | Admitting: Emergency Medicine

## 2011-05-25 DIAGNOSIS — L089 Local infection of the skin and subcutaneous tissue, unspecified: Secondary | ICD-10-CM | POA: Insufficient documentation

## 2011-05-25 DIAGNOSIS — T300 Burn of unspecified body region, unspecified degree: Secondary | ICD-10-CM

## 2011-05-25 DIAGNOSIS — Y92009 Unspecified place in unspecified non-institutional (private) residence as the place of occurrence of the external cause: Secondary | ICD-10-CM | POA: Insufficient documentation

## 2011-05-25 DIAGNOSIS — T22019A Burn of unspecified degree of unspecified forearm, initial encounter: Secondary | ICD-10-CM | POA: Insufficient documentation

## 2011-05-25 DIAGNOSIS — X12XXXA Contact with other hot fluids, initial encounter: Secondary | ICD-10-CM | POA: Insufficient documentation

## 2011-05-25 MED ORDER — CEPHALEXIN 500 MG PO CAPS
500.0000 mg | ORAL_CAPSULE | Freq: Once | ORAL | Status: AC
  Administered 2011-05-25: 500 mg via ORAL
  Filled 2011-05-25: qty 1

## 2011-05-25 MED ORDER — CEPHALEXIN 500 MG PO CAPS: 500.0000 mg | ORAL_CAPSULE | Freq: Four times a day (QID) | ORAL | Status: AC

## 2011-05-25 MED ORDER — BACITRACIN 500 UNIT/GM EX OINT
1.0000 "application " | TOPICAL_OINTMENT | Freq: Two times a day (BID) | CUTANEOUS | Status: DC
  Administered 2011-05-25: 1 via TOPICAL
  Filled 2011-05-25 (×2): qty 0.9

## 2011-05-25 MED ORDER — TETANUS-DIPHTH-ACELL PERTUSSIS 5-2.5-18.5 LF-MCG/0.5 IM SUSP
0.5000 mL | Freq: Once | INTRAMUSCULAR | Status: AC
  Administered 2011-05-25: 0.5 mL via INTRAMUSCULAR
  Filled 2011-05-25: qty 0.5

## 2011-05-25 NOTE — ED Provider Notes (Signed)
Medical screening examination/treatment/procedure(s) were performed by non-physician practitioner and as supervising physician I was immediately available for consultation/collaboration.  Shalicia Craghead P Marlow Hendrie, MD 05/25/11 2311 
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
## 2011-05-25 NOTE — ED Notes (Signed)
Bacitracin applied to wound, telfa applied, wrapped with gauze then secured with Coban. Pt tolerated well.
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
## 2011-05-27 ENCOUNTER — Inpatient Hospital Stay: Payer: Self-pay | Admitting: Surgery

## 2011-05-30 LAB — PATHOLOGY REPORT

## 2012-03-16 ENCOUNTER — Emergency Department (HOSPITAL_COMMUNITY)
Admission: EM | Admit: 2012-03-16 | Discharge: 2012-03-17 | Disposition: A | Discharge status: Home / Self Care | Payer: Medicare Other | Attending: Emergency Medicine | Admitting: Emergency Medicine

## 2012-03-16 ENCOUNTER — Encounter (HOSPITAL_COMMUNITY): Payer: Self-pay | Admitting: *Deleted

## 2012-03-16 DIAGNOSIS — F17200 Nicotine dependence, unspecified, uncomplicated: Secondary | ICD-10-CM | POA: Insufficient documentation

## 2012-03-16 DIAGNOSIS — R600 Localized edema: Secondary | ICD-10-CM

## 2012-03-16 DIAGNOSIS — R609 Edema, unspecified: Secondary | ICD-10-CM | POA: Insufficient documentation

## 2012-03-16 DIAGNOSIS — Z79899 Other long term (current) drug therapy: Secondary | ICD-10-CM | POA: Insufficient documentation

## 2012-03-16 DIAGNOSIS — Z882 Allergy status to sulfonamides status: Secondary | ICD-10-CM | POA: Insufficient documentation

## 2012-03-16 DIAGNOSIS — K219 Gastro-esophageal reflux disease without esophagitis: Secondary | ICD-10-CM | POA: Insufficient documentation

## 2012-03-16 DIAGNOSIS — M129 Arthropathy, unspecified: Secondary | ICD-10-CM | POA: Insufficient documentation

## 2012-03-16 HISTORY — DX: Unspecified osteoarthritis, unspecified site: M19.90

## 2012-03-16 HISTORY — DX: Gastro-esophageal reflux disease without esophagitis: K21.9

## 2012-03-16 MED ORDER — IBUPROFEN 200 MG PO TABS
400.0000 mg | ORAL_TABLET | Freq: Once | ORAL | Status: AC
  Administered 2012-03-16: 400 mg via ORAL
  Filled 2012-03-16: qty 2

## 2012-03-16 NOTE — ED Notes (Signed)
C/o R lower jaw swelling. Onset yesterday, (denies: pain, fever, nv, dizziness, drainage or other sx). "feels like it is cheek and not gums, not sure if r/t missing teeth and eating potato chips".

## 2012-03-17 MED ORDER — HYDROCODONE-ACETAMINOPHEN 5-325 MG PO TABS: 1.0000 | ORAL_TABLET | ORAL | Status: DC | PRN

## 2012-03-17 NOTE — ED Provider Notes (Signed)
Medical screening examination/treatment/procedure(s) were performed by non-physician practitioner and as supervising physician I was immediately available for consultation/collaboration.  Olivia Mackie, MD 03/17/12 [phone]

## 2012-03-17 NOTE — ED Provider Notes (Signed)
History     CSN: [id number]  Arrival date & time 03/16/12  2036   First MD Initiated Contact with Patient 03/17/12 0044      Chief Complaint  Patient presents with  . Oral Swelling    (Consider location/radiation/quality/duration/timing/severity/associated sxs/prior treatment) HPI History provided by pt.   Pt has had edema of right lower jaw since yesterday evening.  No associated jaw or dental pain, fever or skin changes.  Denies trauma but thinks she may have poked herself with a potato chip while eating just prior to onset of sx.    Past Medical History  Diagnosis Date  . Acid reflux   . Arthritis     Past Surgical History  Procedure Date  . Cholecystectomy   . Knee arthroscopy     bilateral    No family history on file.  History  Substance Use Topics  . Smoking status: Current Every Day Smoker  . Smokeless tobacco: Not on file  . Alcohol Use: No    OB History    Grav Para Term Preterm Abortions TAB SAB Ect Mult Living                  Review of Systems  All other systems reviewed and are negative.    Allergies  Sulfur  Home Medications   Current Outpatient Rx  Name Route Sig Dispense Refill  . ASPIRIN 81 MG PO CHEW Oral Chew 81 mg by mouth daily.    Marland Kitchen OMEPRAZOLE 20 MG PO CPDR Oral Take 20 mg by mouth daily.    Marland Kitchen HYDROCODONE-ACETAMINOPHEN 5-325 MG PO TABS Oral Take 1 tablet by mouth every 4 (four) hours as needed for pain. 12 tablet 0    BP 135/60  Pulse 112  Temp 99.6 F (37.6 C) (Oral)  Resp 18  SpO2 94%  Physical Exam  Nursing note and vitals reviewed. Constitutional: She is oriented to person, place, and time. She appears well-developed and well-nourished. No distress.  HENT:  Head: Normocephalic and atraumatic.       Mild edema buccal mucosa along right anterior mandible when compared to left side.  No induration, overlying skin changes or tenderness.  No mandibular tenderness. Poor dentition w/ several missing teeth.  Right lower 1st  premolar is subluxed and decayed but non-tender and adjacent gingiva appears normal.    Eyes:       Normal appearance  Neck: Normal range of motion.  Cardiovascular: Normal rate and regular rhythm.   Pulmonary/Chest: Effort normal and breath sounds normal. No respiratory distress.  Musculoskeletal: Normal range of motion.  Lymphadenopathy:    She has no cervical adenopathy.  Neurological: She is alert and oriented to person, place, and time.  Skin: Skin is warm and dry. No rash noted.  Psychiatric: She has a normal mood and affect. Her behavior is normal.    ED Course  Procedures (including critical care time)  Labs Reviewed - No data to display No results found.   1. Facial edema       MDM  61yo healthy F presents w/ non-traumatic right lower jaw edema.  Mandible non-tender.  Soft-tissue/dental infection unlikely based on both history and exam.  Pt requested prescription for pain medication in case pain worsens tomorrow.  She seems very reasonable and her daughter is a Museum/gallery exhibitions officer at [employer].  I recommended ibuprofen, ice and f/u with PCP for persistent sx.  Return precautions including development of fever, severe pain and skin changes discussed.  Otilio Miu, [place] 03/17/12 [phone]

## 2013-08-21 ENCOUNTER — Other Ambulatory Visit: Payer: Self-pay

## 2013-08-21 ENCOUNTER — Encounter (HOSPITAL_COMMUNITY): Payer: Self-pay | Admitting: Emergency Medicine

## 2013-08-21 ENCOUNTER — Emergency Department (HOSPITAL_COMMUNITY): Payer: Medicare PPO

## 2013-08-21 ENCOUNTER — Emergency Department (HOSPITAL_COMMUNITY)
Admission: EM | Admit: 2013-08-21 | Discharge: 2013-08-22 | Disposition: A | Discharge status: Home / Self Care | Payer: Medicare PPO | Attending: Emergency Medicine | Admitting: Emergency Medicine

## 2013-08-21 DIAGNOSIS — J189 Pneumonia, unspecified organism: Secondary | ICD-10-CM

## 2013-08-21 DIAGNOSIS — K219 Gastro-esophageal reflux disease without esophagitis: Secondary | ICD-10-CM | POA: Insufficient documentation

## 2013-08-21 DIAGNOSIS — F17200 Nicotine dependence, unspecified, uncomplicated: Secondary | ICD-10-CM | POA: Insufficient documentation

## 2013-08-21 DIAGNOSIS — J159 Unspecified bacterial pneumonia: Secondary | ICD-10-CM | POA: Insufficient documentation

## 2013-08-21 DIAGNOSIS — Z79899 Other long term (current) drug therapy: Secondary | ICD-10-CM | POA: Insufficient documentation

## 2013-08-21 DIAGNOSIS — Z7982 Long term (current) use of aspirin: Secondary | ICD-10-CM | POA: Insufficient documentation

## 2013-08-21 DIAGNOSIS — R002 Palpitations: Secondary | ICD-10-CM | POA: Insufficient documentation

## 2013-08-21 DIAGNOSIS — M129 Arthropathy, unspecified: Secondary | ICD-10-CM | POA: Insufficient documentation

## 2013-08-21 LAB — BASIC METABOLIC PANEL
BUN: 9 mg/dL (ref 6–23)
CALCIUM: 9.4 mg/dL (ref 8.4–10.5)
CO2: 26 meq/L (ref 19–32)
Chloride: 98 mEq/L (ref 96–112)
Creatinine, Ser: 0.67 mg/dL (ref 0.50–1.10)
GFR calc Af Amer: >90 mL/min (ref >90)
GLUCOSE: 97 mg/dL (ref 70–99)
POTASSIUM: 3.9 meq/L (ref 3.7–5.3)
SODIUM: 137 meq/L (ref 137–147)

## 2013-08-21 LAB — CBC
HCT: 47.2 % — ABNORMAL HIGH (ref 36.0–46.0)
HEMOGLOBIN: 15.9 g/dL — AB (ref 12.0–15.0)
MCH: 31.7 pg (ref 26.0–34.0)
MCHC: 33.7 g/dL (ref 30.0–36.0)
MCV: 94.2 fL (ref 78.0–100.0)
PLATELETS: 199 10*3/uL (ref 150–400)
RBC: 5.01 MIL/uL (ref 3.87–5.11)
RDW: 13.4 % (ref 11.5–15.5)
WBC: 8.9 10*3/uL (ref 4.0–10.5)

## 2013-08-21 LAB — I-STAT TROPONIN, ED: Troponin i, poc: 0 ng/mL (ref 0.00–0.08)

## 2013-08-21 NOTE — ED Notes (Addendum)
Pt reports cp (tightness) and sob intermittently for 3 days. Pt had heart cath 10years ago finding 25% blockage. No stent placed. No trouble since. Denies cough. Pt smokes at least 1 pack a day. Pt sts she has been feeling her pulse and it doesn't feel like it is regular. Pt took 324 asa pta.

## 2013-08-22 MED ORDER — ALBUTEROL SULFATE (2.5 MG/3ML) 0.083% IN NEBU
5.0000 mg | INHALATION_SOLUTION | Freq: Once | RESPIRATORY_TRACT | Status: AC
  Administered 2013-08-22: 5 mg via RESPIRATORY_TRACT
  Filled 2013-08-22: qty 6

## 2013-08-22 MED ORDER — LEVOFLOXACIN 500 MG PO TABS: 500.0000 mg | ORAL_TABLET | Freq: Every day | ORAL | Status: DC

## 2013-08-22 MED ORDER — LEVOFLOXACIN 500 MG PO TABS
500.0000 mg | ORAL_TABLET | Freq: Once | ORAL | Status: AC
  Administered 2013-08-22: 500 mg via ORAL
  Filled 2013-08-22: qty 1

## 2013-08-22 NOTE — ED Notes (Signed)
Called Pharmacy for missing dose of Levaquin PO.

## 2013-08-22 NOTE — ED Notes (Signed)
MD at bedside. 

## 2013-08-22 NOTE — ED Provider Notes (Signed)
CSN: [id number]     Arrival date & time 08/21/13  2104 History   First MD Initiated Contact with Patient 08/21/13 2341     Chief Complaint  Patient presents with  . Chest Pain  . Shortness of Breath     (Consider location/radiation/quality/duration/timing/severity/associated sxs/prior Treatment) Patient is a 63 y.o. female presenting with chest pain and shortness of breath. The history is provided by the patient. No language interpreter was used.  Chest Pain Pain location:  Substernal area Pain quality: tightness   Pain radiates to:  Does not radiate Pain radiates to the back: no   Pain severity:  Moderate Onset quality:  Gradual Duration:  3 days Timing:  Intermittent Progression:  Waxing and waning Chronicity:  New Relieved by:  Rest Ineffective treatments:  None tried Associated symptoms: palpitations and shortness of breath   Associated symptoms: no nausea and not vomiting   Risk factors: obesity and smoking   Shortness of Breath Associated symptoms: chest pain   Associated symptoms: no vomiting     Past Medical History  Diagnosis Date  . Acid reflux   . Arthritis    Past Surgical History  Procedure Laterality Date  . Cholecystectomy    . Knee arthroscopy      bilateral   No family history on file. History  Substance Use Topics  . Smoking status: Current Every Day Smoker  . Smokeless tobacco: Not on file  . Alcohol Use: No   OB History   Grav Para Term Preterm Abortions TAB SAB Ect Mult Living                 Review of Systems  Respiratory: Positive for shortness of breath.   Cardiovascular: Positive for chest pain and palpitations.  Gastrointestinal: Negative for nausea and vomiting.  All other systems reviewed and are negative.      Allergies  Sulfur  Home Medications   Current Outpatient Rx  Name  Route  Sig  Dispense  Refill  . aspirin 81 MG chewable tablet   Oral   Chew 81 mg by mouth daily.         Marland Kitchen omeprazole (PRILOSEC) 20 MG  capsule   Oral   Take 20 mg by mouth daily.         Sallye Lat (EYE DROPS ADVANCED RELIEF) 0.05-0.1-1-1 % SOLN   Ophthalmic   Apply 1 tablet to eye daily.          BP 154/72  Pulse 95  Temp(Src) 97.9 F (36.6 C) (Oral)  Resp 18  Ht 5\' 6"  (1.676 m)  Wt 311 lb (141.069 kg)  BMI 50.22 kg/m2  SpO2 98% Physical Exam  Nursing note and vitals reviewed. Constitutional: She is oriented to person, place, and time. She appears well-developed and well-nourished. No distress.  HENT:  Head: Normocephalic.  Eyes: Pupils are equal, round, and reactive to light.  Neck: Normal range of motion.  Cardiovascular: Normal rate, normal heart sounds and intact distal pulses.  An irregular rhythm present.  Occasional PVC  Pulmonary/Chest: Effort normal and breath sounds normal.  Abdominal: Soft. Bowel sounds are normal.  Musculoskeletal: She exhibits no edema and no tenderness.  Lymphadenopathy:    She has no cervical adenopathy.  Neurological: She is alert and oriented to person, place, and time.  Skin: Skin is warm and dry. No rash noted.  Psychiatric: She has a normal mood and affect. Her behavior is normal. Judgment and thought content normal.    ED Course  Procedures (including critical care time) Labs Review Labs Reviewed  CBC - Abnormal; Notable for the following:    Hemoglobin 15.9 (*)    HCT 47.2 (*)    All other components within normal limits  BASIC METABOLIC PANEL  I-STAT TROPOININ, ED   Imaging Review Dg Chest 2 View  08/21/2013   CLINICAL DATA:  Chest pain, shortness of breath  EXAM: CHEST  2 VIEW  COMPARISON:  02/02/2005  FINDINGS: Mild middle lobe/ lung base opacities. Cardiomediastinal contours within upper normal range. No pleural effusion or pneumothorax. Multilevel degenerative change.  IMPRESSION: Mild middle lobe/ lung base opacities; atelectasis versus infiltrate.   Electronically Signed   By: Carlos Levering M.D.   On: 08/21/2013 22:44      EKG Interpretation None     History of URI symptoms one month ago, persistent fatigue and occasional shortness of breath.  Increasing shortness of breath over last 3 days with chest tightness.  Patient has noted irregular heart rate on occasion.  ECG reveals NSR without ectopy, although during exam several PVC's noted.  Negative troponin.  No leukocytosis.  CXR results reviewed, shared with patient, revealing middle lobe lung base opacities (? Infiltrate).  Discussed with Dr. Sabra Heck, will treat as CAP.  Patient discharge home with RX for levaquin (1st dose given in ED).  Patient to follow-up with her PCP in the next week.  Return precautions discussed. MDM   Final diagnoses:  None    CAP.    Norman Herrlich, NP 08/22/13 [phone]

## 2013-08-22 NOTE — Discharge Instructions (Signed)

## 2013-08-22 NOTE — ED Provider Notes (Signed)
63 year old female who presents with approximately one month of feeling a slight tightness and shortness of breath or chest. She denies any diaphoresis nausea vomiting peripheral swelling orthopnea paroxysmal nocturnal dyspnea or dyspnea on exertion. She has no history of coronary disease. She states this started after having an upper respiratory infection and has just not gone away. She does report having decreased energy and fatigue but no focal weakness. On exam she is clear heart and lung sounds, no tachycardia, no murmur, no peripheral edema and no JVD. Her labs and x-ray are all unremarkable, the x-ray was read as possibly having a slight infiltrate. The patient does not appear toxic, she has been told to take albuterol meter dose inhaler doses in the past, she will be given albuterol nebulizer here, sent home with antibiotics and will followup with her family doctor this week, cardiology if symptoms become worsened. She has expressed her understanding to the indications for return.  Medical screening examination/treatment/procedure(s) were conducted as a shared visit with non-physician practitioner(s) and myself. I personally evaluated the patient during the encounter.  Clinical Impression: Shortness of breath   Johnna Acosta, MD 08/22/13 [phone]

## 2014-03-23 ENCOUNTER — Encounter (HOSPITAL_COMMUNITY): Payer: Self-pay | Admitting: Pharmacy Technician

## 2014-04-04 ENCOUNTER — Ambulatory Visit (HOSPITAL_COMMUNITY)
Admission: RE | Admit: 2014-04-04 | Discharge: 2014-04-04 | Disposition: A | Discharge status: Home / Self Care | Payer: Medicare PPO | Source: Ambulatory Visit | Attending: Cardiology | Admitting: Cardiology

## 2014-04-04 ENCOUNTER — Encounter (HOSPITAL_COMMUNITY)
Admission: RE | Disposition: A | Discharge status: Home / Self Care | Payer: Self-pay | Source: Ambulatory Visit | Attending: Cardiology

## 2014-04-04 DIAGNOSIS — I70209 Unspecified atherosclerosis of native arteries of extremities, unspecified extremity: Secondary | ICD-10-CM | POA: Diagnosis present

## 2014-04-04 DIAGNOSIS — I70292 Other atherosclerosis of native arteries of extremities, left leg: Secondary | ICD-10-CM | POA: Insufficient documentation

## 2014-04-04 DIAGNOSIS — K219 Gastro-esophageal reflux disease without esophagitis: Secondary | ICD-10-CM

## 2014-04-04 DIAGNOSIS — E662 Morbid (severe) obesity with alveolar hypoventilation: Secondary | ICD-10-CM

## 2014-04-04 DIAGNOSIS — I739 Peripheral vascular disease, unspecified: Secondary | ICD-10-CM

## 2014-04-04 DIAGNOSIS — E785 Hyperlipidemia, unspecified: Secondary | ICD-10-CM | POA: Diagnosis not present

## 2014-04-04 DIAGNOSIS — Z7982 Long term (current) use of aspirin: Secondary | ICD-10-CM | POA: Diagnosis not present

## 2014-04-04 HISTORY — PX: LOWER EXTREMITY ANGIOGRAM: SHX5508

## 2014-04-04 LAB — POCT ACTIVATED CLOTTING TIME
Activated Clotting Time: 264 seconds
Activated Clotting Time: 225 seconds

## 2014-04-04 SURGERY — ANGIOGRAM, LOWER EXTREMITY
Anesthesia: LOCAL

## 2014-04-04 MED ORDER — MIDAZOLAM HCL 2 MG/2ML IJ SOLN
INTRAMUSCULAR | Status: AC
  Filled 2014-04-04: qty 2

## 2014-04-04 MED ORDER — SODIUM CHLORIDE 0.9 % IV SOLN
INTRAVENOUS | Status: DC
  Administered 2014-04-04: 500 mL via INTRAVENOUS

## 2014-04-04 MED ORDER — LIDOCAINE HCL (PF) 1 % IJ SOLN
INTRAMUSCULAR | Status: AC
  Filled 2014-04-04: qty 30

## 2014-04-04 MED ORDER — CLOPIDOGREL BISULFATE 300 MG PO TABS
ORAL_TABLET | ORAL | Status: AC
  Filled 2014-04-04: qty 2

## 2014-04-04 MED ORDER — SODIUM CHLORIDE 0.9 % IV SOLN: 1.0000 mL/kg/h | INTRAVENOUS | Status: DC

## 2014-04-04 MED ORDER — OXYCODONE-ACETAMINOPHEN 5-325 MG PO TABS: 1.0000 | ORAL_TABLET | ORAL | Status: DC | PRN

## 2014-04-04 MED ORDER — HYDROMORPHONE HCL 1 MG/ML IJ SOLN
INTRAMUSCULAR | Status: AC
  Filled 2014-04-04: qty 1

## 2014-04-04 MED ORDER — NITROGLYCERIN 0.2 MG/ML ON CALL CATH LAB
INTRAVENOUS | Status: AC
  Filled 2014-04-04: qty 1

## 2014-04-04 MED ORDER — HEPARIN SODIUM (PORCINE) 1000 UNIT/ML IJ SOLN
INTRAMUSCULAR | Status: AC
  Filled 2014-04-04: qty 1

## 2014-04-04 MED ORDER — HEPARIN (PORCINE) IN NACL 2-0.9 UNIT/ML-% IJ SOLN
INTRAMUSCULAR | Status: AC
  Filled 2014-04-04: qty 500

## 2014-04-04 MED ORDER — CLOPIDOGREL BISULFATE 300 MG PO TABS
600.0000 mg | ORAL_TABLET | Freq: Once | ORAL | Status: AC
  Administered 2014-04-04: 600 mg via ORAL

## 2014-04-04 NOTE — Discharge Instructions (Signed)

## 2014-04-04 NOTE — Interval H&P Note (Signed)
History and Physical Interval Note:  04/04/2014 9:26 AM  Daisy [name]  has presented today for surgery, with the diagnosis of Claudication  The various methods of treatment have been discussed with the patient and family. After consideration of risks, benefits and other options for treatment, the patient has consented to  Procedure(s): LOWER EXTREMITY ANGIOGRAM (N/A) and possible PTA as a surgical intervention .  The patient's history has been reviewed, patient examined, no change in status, stable for surgery.  I have reviewed the patient's chart and labs.  Questions were answered to the patient's satisfaction.     Laverda Page

## 2014-04-04 NOTE — CV Procedure (Signed)
Procedures performed:  1. Right Femoral Arterial access 2. Abdominal aortogram.  3. Crossover from right to the left  femoral artery placement of catheter tip in the left  femoral artery.  4. left femoral arteriogram with distal runoff 5. PTA and stenting of the left SFA with 6.0 x 150 mm Smartflex self expanding stent. 6. Right femoral arteriogram  and closure of the  femoral arterial access with  Perclose.  Indication: Claudication,  Abnormal LE arterial duplex.   Peripheral arthrogram: No evidence of abdominal aneurysm. 2 renal arteries on the left and one on right side and they're widely patent.   Aortoiliac bifurcation was widely patent.  Femoral arteriogram: The left proximal SFA shows mild disease. The SFA is occluded in the Hunter's cannot and that outside of the Hunter's cannot it reconstitutes above the knee. Below the left knee there is three-vessel runoff. Slow filling is evident.  Interventional data: Successful PTA and stenting of the chronically totally occluded left mid SFA with implantation of a 6.0 x 150 mm Smartflex self-expanding stent. Stenosis reduced from 100% to 0%. Brisk flow was evident in the lower extremity below the knee. Post procedure below the knee vasculature was not well visualized due to limited contrast use.  TECHNIQUE OF THE  PROCEDURE: Under sterile precautions using a 5-French right femoral arterial access, using a micropuncture needle and micropuncture technique, I was able to exchange the micropuncture access wire to a 0.035 inch Versacore wire. A  5 [place] Omniflush catheter was advanced  into the desscending aorta and arteriogram was performed.   Femoral arterial runoff: Left femoral arterial runoff was performed using the same catheter after crossing over from the right  femoral artery into the left femoral artery with the help of a Versacore wire. The catheter tip was positioned into the left external iliac artery and left femoral arteriogram was  performed.  Technique of intervention: Using heparin for anticoagulation and a 7 French  Ansel 45 cm sheath, sheath was placed into the left femoral artery. Then a long Versacore wire was utilized to cross the stenosis with mild amount of difficulty and the tip of the wire was carefully positioned in the mid SFA. Then I used a 5 [place] Glidecath end hole catheter and with the help of a Glidewire, 0.035 inch for backup support, I was able to prolapse the catheter and wire and advanced into the occluded left SFA and was able to the entry into the distal left SFA. Then we proceeded with balloon angioplasty utilizing a 5.0 x 1 50 mm [place] balloon, and balloon and plasty was performed at 14 atmospheric pressure for 90 seconds. The balloon was withdrawn out of the body and after carefully evaluating the lesion, we decided to proceed with implantation of a 6.0 x 1 50 mm self-expanding stent under fluoroscopic guidance. The stent was post dilated with a previously used Armida balloon at 16 atmospheric pressure. Following this angiography was repeated and excellent results were confirmed.  The balloon and the wires were withdrawn out of the body and the sheath was gently pulled into the right femoral artery and right femoral arteriogram was performed to evaluate the arterial access and arterial access was closed with Perclose with excellent hemostasis. Patient tolerated the procedure well. There was no immediate complication. A total of 122 cc of contrast was utilized for diagnostic and intervention procedure.

## 2014-04-04 NOTE — H&P (Signed)
  Please see office visit notes for complete details of HPI.  

## 2014-04-07 ENCOUNTER — Inpatient Hospital Stay: Payer: Self-pay | Admitting: Internal Medicine

## 2014-04-07 LAB — CBC WITH DIFFERENTIAL/PLATELET
Basophil #: 0 10*3/uL (ref 0.0–0.1)
Basophil %: 0.3 %
EOS ABS: 0 10*3/uL (ref 0.0–0.7)
Eosinophil %: 0.1 %
HCT: 41.7 % (ref 35.0–47.0)
HGB: 13.6 g/dL (ref 12.0–16.0)
LYMPHS PCT: 5 %
Lymphocyte #: 0.7 10*3/uL — ABNORMAL LOW (ref 1.0–3.6)
MCH: 30.6 pg (ref 26.0–34.0)
MCHC: 32.5 g/dL (ref 32.0–36.0)
MCV: 94 fL (ref 80–100)
MONO ABS: 1.1 x10 3/mm — AB (ref 0.2–0.9)
MONOS PCT: 7.9 %
Neutrophil #: 12.4 10*3/uL — ABNORMAL HIGH (ref 1.4–6.5)
Neutrophil %: 86.7 %
Platelet: 156 10*3/uL (ref 150–440)
RBC: 4.42 10*6/uL (ref 3.80–5.20)
RDW: 13.6 % (ref 11.5–14.5)
WBC: 14.3 10*3/uL — ABNORMAL HIGH (ref 3.6–11.0)

## 2014-04-07 LAB — TROPONIN I

## 2014-04-07 LAB — COMPREHENSIVE METABOLIC PANEL
ALK PHOS: 79 U/L
Albumin: 3.3 g/dL — ABNORMAL LOW (ref 3.4–5.0)
Anion Gap: 8 (ref 7–16)
BUN: 9 mg/dL (ref 7–18)
Bilirubin,Total: 0.7 mg/dL (ref 0.2–1.0)
CHLORIDE: 103 mmol/L (ref 98–107)
CREATININE: 1.04 mg/dL (ref 0.60–1.30)
Calcium, Total: 8.9 mg/dL (ref 8.5–10.1)
Co2: 27 mmol/L (ref 21–32)
Glucose: 137 mg/dL — ABNORMAL HIGH (ref 65–99)
Osmolality: 277 (ref 275–301)
Potassium: 3.2 mmol/L — ABNORMAL LOW (ref 3.5–5.1)
SGOT(AST): 29 U/L (ref 15–37)
SGPT (ALT): 33 U/L
SODIUM: 138 mmol/L (ref 136–145)
TOTAL PROTEIN: 6.8 g/dL (ref 6.4–8.2)

## 2014-04-07 LAB — URINALYSIS, COMPLETE
BILIRUBIN, UR: NEGATIVE
Glucose,UR: NEGATIVE mg/dL (ref 0–75)
Ketone: NEGATIVE
Nitrite: POSITIVE
Ph: 6 (ref 4.5–8.0)
Protein: 30
SPECIFIC GRAVITY: 1.006 (ref 1.003–1.030)
Squamous Epithelial: 5
WBC UR: 880 /HPF (ref 0–5)

## 2014-04-08 LAB — BASIC METABOLIC PANEL
ANION GAP: 10 (ref 7–16)
BUN: 10 mg/dL (ref 7–18)
CALCIUM: 8.2 mg/dL — AB (ref 8.5–10.1)
Chloride: 105 mmol/L (ref 98–107)
Co2: 28 mmol/L (ref 21–32)
Creatinine: 1.05 mg/dL (ref 0.60–1.30)
EGFR (Non-African Amer.): 56 — ABNORMAL LOW
Glucose: 127 mg/dL — ABNORMAL HIGH (ref 65–99)
Osmolality: 286 (ref 275–301)
Potassium: 2.8 mmol/L — ABNORMAL LOW (ref 3.5–5.1)
SODIUM: 143 mmol/L (ref 136–145)

## 2014-04-08 LAB — CBC WITH DIFFERENTIAL/PLATELET
Basophil #: 0 10*3/uL (ref 0.0–0.1)
Basophil %: 0.1 %
Eosinophil #: 0 10*3/uL (ref 0.0–0.7)
Eosinophil %: 0 %
HCT: 41.1 % (ref 35.0–47.0)
HGB: 13.1 g/dL (ref 12.0–16.0)
Lymphocyte #: 0.3 10*3/uL — ABNORMAL LOW (ref 1.0–3.6)
Lymphocyte %: 2.1 %
MCH: 30.3 pg (ref 26.0–34.0)
MCHC: 32 g/dL (ref 32.0–36.0)
MCV: 95 fL (ref 80–100)
Monocyte #: 0.3 x10 3/mm (ref 0.2–0.9)
Monocyte %: 2.2 %
NEUTROS PCT: 95.6 %
Neutrophil #: 12.9 10*3/uL — ABNORMAL HIGH (ref 1.4–6.5)
Platelet: 137 10*3/uL — ABNORMAL LOW (ref 150–440)
RBC: 4.34 10*6/uL (ref 3.80–5.20)
RDW: 13.3 % (ref 11.5–14.5)
WBC: 13.5 10*3/uL — AB (ref 3.6–11.0)

## 2014-04-09 LAB — URINE CULTURE

## 2014-04-12 LAB — CULTURE, BLOOD (SINGLE)

## 2014-04-18 ENCOUNTER — Emergency Department (HOSPITAL_COMMUNITY)
Admission: EM | Admit: 2014-04-18 | Discharge: 2014-04-19 | Disposition: A | Discharge status: Home / Self Care | Payer: Medicare PPO | Attending: Emergency Medicine | Admitting: Emergency Medicine

## 2014-04-18 ENCOUNTER — Encounter (HOSPITAL_COMMUNITY): Payer: Self-pay | Admitting: *Deleted

## 2014-04-18 DIAGNOSIS — Z7982 Long term (current) use of aspirin: Secondary | ICD-10-CM | POA: Diagnosis not present

## 2014-04-18 DIAGNOSIS — M1990 Unspecified osteoarthritis, unspecified site: Secondary | ICD-10-CM | POA: Insufficient documentation

## 2014-04-18 DIAGNOSIS — Z299 Encounter for prophylactic measures, unspecified: Secondary | ICD-10-CM

## 2014-04-18 DIAGNOSIS — Z720 Tobacco use: Secondary | ICD-10-CM | POA: Insufficient documentation

## 2014-04-18 DIAGNOSIS — I82409 Acute embolism and thrombosis of unspecified deep veins of unspecified lower extremity: Secondary | ICD-10-CM | POA: Diagnosis not present

## 2014-04-18 DIAGNOSIS — Z79899 Other long term (current) drug therapy: Secondary | ICD-10-CM | POA: Insufficient documentation

## 2014-04-18 DIAGNOSIS — K219 Gastro-esophageal reflux disease without esophagitis: Secondary | ICD-10-CM | POA: Diagnosis not present

## 2014-04-18 DIAGNOSIS — Z9049 Acquired absence of other specified parts of digestive tract: Secondary | ICD-10-CM | POA: Insufficient documentation

## 2014-04-18 DIAGNOSIS — R2242 Localized swelling, mass and lump, left lower limb: Secondary | ICD-10-CM | POA: Diagnosis present

## 2014-04-18 LAB — CBC
HCT: 41.2 % (ref 36.0–46.0)
HEMOGLOBIN: 13.2 g/dL (ref 12.0–15.0)
MCH: 30.8 pg (ref 26.0–34.0)
MCHC: 32 g/dL (ref 30.0–36.0)
MCV: 96.3 fL (ref 78.0–100.0)
PLATELETS: 315 10*3/uL (ref 150–400)
RBC: 4.28 MIL/uL (ref 3.87–5.11)
RDW: 13.6 % (ref 11.5–15.5)
WBC: 9.3 10*3/uL (ref 4.0–10.5)

## 2014-04-18 LAB — BASIC METABOLIC PANEL
Anion gap: 10 (ref 5–15)
BUN: 9 mg/dL (ref 6–23)
CO2: 32 mEq/L (ref 19–32)
CREATININE: 0.79 mg/dL (ref 0.50–1.10)
Calcium: 9.3 mg/dL (ref 8.4–10.5)
Chloride: 101 mEq/L (ref 96–112)
GFR, EST NON AFRICAN AMERICAN: 87 mL/min — AB (ref >90)
Glucose, Bld: 128 mg/dL — ABNORMAL HIGH (ref 70–99)
Potassium: 3.3 mEq/L — ABNORMAL LOW (ref 3.7–5.3)
Sodium: 143 mEq/L (ref 137–147)

## 2014-04-18 LAB — PROTIME-INR
INR: 0.99 (ref 0.00–1.49)
PROTHROMBIN TIME: 13.2 s (ref 11.6–15.2)

## 2014-04-18 NOTE — ED Notes (Signed)
Pt in c/o left lower leg swelling, states she had a stent placed in her left leg on 10/20, CMS intact, pt was also recently admitted to [facility] for urosepsis, alert and oriented, no distress noted

## 2014-04-19 ENCOUNTER — Ambulatory Visit (HOSPITAL_COMMUNITY)
Admission: RE | Admit: 2014-04-19 | Discharge: 2014-04-19 | Disposition: A | Discharge status: Home / Self Care | Payer: Medicare PPO | Source: Ambulatory Visit | Attending: Emergency Medicine | Admitting: Emergency Medicine

## 2014-04-19 ENCOUNTER — Other Ambulatory Visit (HOSPITAL_COMMUNITY): Payer: Self-pay | Admitting: Emergency Medicine

## 2014-04-19 ENCOUNTER — Ambulatory Visit (HOSPITAL_COMMUNITY): Admission: RE | Admit: 2014-04-19 | Payer: Medicare PPO | Source: Ambulatory Visit

## 2014-04-19 DIAGNOSIS — M7989 Other specified soft tissue disorders: Secondary | ICD-10-CM | POA: Insufficient documentation

## 2014-04-19 DIAGNOSIS — M79662 Pain in left lower leg: Secondary | ICD-10-CM | POA: Diagnosis present

## 2014-04-19 DIAGNOSIS — M79609 Pain in unspecified limb: Secondary | ICD-10-CM

## 2014-04-19 MED ORDER — RIVAROXABAN 15 MG PO TABS
15.0000 mg | ORAL_TABLET | Freq: Once | ORAL | Status: AC
  Administered 2014-04-19: 15 mg via ORAL
  Filled 2014-04-19: qty 1

## 2014-04-19 NOTE — ED Notes (Signed)
Spoke with Daisy [name] in pharmacy, will have xarelto sent.

## 2014-04-19 NOTE — ED Provider Notes (Signed)
63 year old female who is two-week status post stent in her left femoral artery has noticed some mild swelling in the posterior aspect of her left knee and in her left lower leg. This is painless. There is no difficulty breathing and no chest pain. On exam, she has asymmetric edema which is less on the left and 1+ on the right. Dorsalis pedis pulses present and capillary refill is prompt. I can find no area of tenderness. Left calf circumferences 1.5 cm greater than right calf circumference. I am concerned about possibility of DVT although I would consider this rather unlikely. She will be given a dose of rivaroxaban and arrangements made for outpatient venous Doppler study in the morning.  Medical screening examination/treatment/procedure(s) were conducted as a shared visit with non-physician practitioner(s) and myself.  I personally evaluated the patient during the encounter.    Delora Fuel, MD 32/91/91 [id number]

## 2014-04-19 NOTE — ED Provider Notes (Signed)
CSN: [id number]     Arrival date & time 04/18/14  2245 History   First MD Initiated Contact with Patient 04/18/14 2355     Chief Complaint  Patient presents with  . Leg Swelling     (Consider location/radiation/quality/duration/timing/severity/associated sxs/prior Treatment) HPI Comments: Patient presents with swelling to L ankle over the past 3 days.  She has stint placed in L leg 10/20 for an occlusion than hospitalization for urosepsis and released from the hospital 5 days ago. Also noted a "knot" behind her knee that is mildly tender but not painful  Denies SOB, CP, trauma  The history is provided by the patient.    Past Medical History  Diagnosis Date  . Acid reflux   . Arthritis    Past Surgical History  Procedure Laterality Date  . Cholecystectomy    . Knee arthroscopy      bilateral   History reviewed. No pertinent family history. History  Substance Use Topics  . Smoking status: Current Every Day Smoker  . Smokeless tobacco: Not on file  . Alcohol Use: No   OB History    No data available     Review of Systems  Constitutional: Negative for fever.  Respiratory: Negative for shortness of breath.   Cardiovascular: Negative for chest pain.  Genitourinary: Negative for dysuria.  Musculoskeletal: Negative for joint swelling.  Skin: Negative for rash and wound.  Neurological: Negative for dizziness and headaches.  All other systems reviewed and are negative.     Allergies  Sulfur  Home Medications   Prior to Admission medications   Medication Sig Start Date End Date Taking? Authorizing Provider  albuterol (PROAIR HFA) 108 (90 BASE) MCG/ACT inhaler Inhale 2 puffs into the lungs every 6 (six) hours as needed for wheezing or shortness of breath.    Historical Provider, MD  aspirin 81 MG chewable tablet Chew 81 mg by mouth daily.    Historical Provider, MD  atorvastatin (LIPITOR) 10 MG tablet Take 10 mg by mouth daily.    Historical Provider, MD  cilostazol  (PLETAL) 100 MG tablet Take 100 mg by mouth 2 (two) times daily.    Historical Provider, MD  DULoxetine (CYMBALTA) 60 MG capsule Take 60 mg by mouth daily.    Historical Provider, MD  etodolac (LODINE) 400 MG tablet Take 400 mg by mouth 2 (two) times daily as needed (arthritis pain).    Historical Provider, MD  HYDROcodone-acetaminophen (NORCO/VICODIN) 5-325 MG per tablet Take 1 tablet by mouth every 6 (six) hours as needed for moderate pain.    Historical Provider, MD  omeprazole (PRILOSEC) 20 MG capsule Take 20 mg by mouth daily.    Historical Provider, MD  Tetrahydroz-Dextran-PEG-Povid (EYE DROPS ADVANCED RELIEF) 0.05-0.1-1-1 % SOLN Place 1 drop into both eyes daily as needed (dryness).     Historical Provider, MD   BP 121/57 mmHg  Pulse 85  Temp(Src) 98 F (36.7 C) (Oral)  Resp 18  Ht 5\' 7"  (1.702 m)  Wt 300 lb (136.079 kg)  BMI 46.98 kg/m2  SpO2 95% Physical Exam  Constitutional: She appears well-developed and well-nourished.  HENT:  Head: Normocephalic.  Eyes: Pupils are equal, round, and reactive to light.  Neck: Normal range of motion.  Cardiovascular: Normal rate and regular rhythm.   Pulmonary/Chest: Effort normal and breath sounds normal.  Musculoskeletal: She exhibits edema. She exhibits no tenderness.       Left ankle: She exhibits swelling. She exhibits normal range of motion, no ecchymosis and no  deformity. No tenderness.       Legs: circumference of L calf 1.5 CM larger than R   Skin: Skin is warm. No erythema. No pallor.  Nursing note and vitals reviewed.   ED Course  Procedures (including critical care time) Labs Review Labs Reviewed  BASIC METABOLIC PANEL - Abnormal; Notable for the following:    Potassium 3.3 (*)    Glucose, Bld 128 (*)    GFR calc non Af Amer 87 (*)    All other components within normal limits  CBC  PROTIME-INR    Imaging Review No results found.   EKG Interpretation None    Wells criteria for DVT is 3-- high risk  concern for  DVT has been given one dose of Xarelto 15 mg and scheduled for vascular doppler in the morning  MDM   Final diagnoses:  DVT prophylaxis         Garald Balding, NP 04/19/14 0031

## 2014-04-19 NOTE — Discharge Instructions (Signed)
You have been give a dose of anticoagulant and scheduled for a vascular doppler in the morning to evaluate for a blood clot in your leg Try to have this before your scheduled surgeon visit to help him determine further treatment

## 2014-04-19 NOTE — Progress Notes (Signed)
VASCULAR LAB PRELIMINARY  PRELIMINARY  PRELIMINARY  PRELIMINARY  Left lower extremity venous duplex completed.    Preliminary report:  Left:  No evidence of DVT, superficial thrombosis, or Baker's cyst.  Daisy [name], RVS 04/19/2014, 10:52 AM

## 2014-05-25 ENCOUNTER — Encounter (HOSPITAL_COMMUNITY): Payer: Self-pay | Admitting: Cardiology

## 2014-10-07 NOTE — Discharge Summary (Signed)
PATIENT NAME:  Daisy [name], MCCLARTY MR#:  [id number] DATE OF BIRTH:  09-23-1950  DATE OF ADMISSION:  04/07/2014 DATE OF DISCHARGE:  04/08/2014  PRESENTING COMPLAINT: Fever.   DISCHARGE DIAGNOSES:  1. Sepsis due to urinary tract infection, relative asymptomatic hypotension.  2. Left lower extremity peripheral vascular disease status post stent placement.  3. Obesity.   CODE STATUS: Full code.   MEDICATIONS:  1. Prilosec 20 mg p.o. daily.  2. ProAir HFA 2 puffs 4 times a day as needed.  3. Aspirin 81 mg daily.  4. Lipitor 10 mg at bedtime.  5. Cilostazol 100 mg b.i.d.  6. Cymbalta 60 mg extended release p.o. daily.  7. Etodolac 400 mg b.i.d.  8. Tetrahydrozoline ophthalmic drops 0.05% affected eye 4 times a day.  9. Ceftin 500 mg b.i.d.  10. Vicodin 1-2 q. 6 p.r.n.  11. Follow up with your primary care physician in 1-2 weeks.   LABORATORY DATA: White count done at discharge was 13.5. Creatinine 1.05.   Chest x-ray negative for pneumonia.   Blood cultures are negative at 48 hours.   UA positive for UTI, urine culture Escherichia coli, pansensitive.  HOSPITAL COURSE: Annaston Upham is a 64 year old Caucasian female who presented with:  1. Sepsis secondary to UTI indicative of fever, leukocytosis, and increased respiratory rate. She was started on IV Rocephin, remained afebrile. White count was stable, felt better, was requesting to go home. She was worried about her dogs. She received some IV fluids. She had relative low blood pressure but remained asymptomatic, ambulated after breakfast without any trouble. She is to complete a course with Ceftin as outpatient.  2. PVD. Continue aspirin, Pletal, and statins. 3. DVT prophylaxis with subcutaneous heparin.  4. Hypokalemia. Repleted. Hospital stay otherwise remained stable.   CODE STATUS: The patient remained a full code.   TIME SPENT: 40 minutes.    ____________________________ Hart Rochester Posey Pronto, MD sap:lt D: 04/11/2014 13:25:55  ET T: 04/11/2014 23:02:30 ET JOB#: [id number]  cc: Beth Spackman A. Posey Pronto, MD, <Dictator> Ilda Basset MD ELECTRONICALLY SIGNED 04/27/2014 7:42

## 2014-10-07 NOTE — H&P (Signed)
PATIENT NAME:  Daisy [name], Daisy [name] MR#:  [id number] DATE OF BIRTH:  02-Mar-1951  DATE OF ADMISSION:  04/07/2014  REFERRING PHYSICIAN: Ahmed Prima, MD  PRIMARY CARE PHYSICIAN: [employer].   CHIEF COMPLAINT: Fever.   HISTORY OF PRESENT ILLNESS: This is a 64 year old Caucasian female with a past medical history of peripheral vascular disease who underwent recent left lower extremity arterial stent placement about 3 days ago, as well as gastroesophageal reflux disease, presenting with fevers. Temperature at home 102 degrees Fahrenheit. Left lower extremity stent placement due to peripheral vascular disease 3 days ago performed at [facility]. Had a Foley placed during the procedure which she described as a painful insertion. Over the last 2 days she has developed dysuria and increased urinary frequency and noted a fever of 2 days' duration, once again, of  102 degrees Fahrenheit. Originally was evaluated by urgent care. They recommended her presenting to the hospital for further workup and evaluation given her recent procedure and further concerns of complication. The patient has no further complaints at this time.   REVIEW OF SYSTEMS: CONSTITUTIONAL: Positive for fevers, chills as described above. Denies any fatigue, weakness.  EYES: Denies blurry vision, double vision, or eye pain. ENT: Denies tinnitus, ear pain, hearing loss. RESPIRATORY: Denies cough or shortness of breath. CARDIOVASCULAR: Denies chest pain, palpitations, edema. GASTROINTESTINAL: Denies nausea, vomiting, diarrhea, abdominal pain.  GENITOURINARY: Positive for dysuria, increased urinary frequency as described above. Denies any hematuria.  ENDOCRINE: Denies nocturia or thyroid problems. HEMATOLOGY AND LYMPHATIC: Denies easy bruising and bleeding.  SKIN: Denies rashes or lesions.  MUSCULOSKELETAL: Denies pain in neck, back, shoulder, knees, hips, or arthritic symptoms.  NEUROLOGIC: Denies paralysis, paresthesias.  PSYCHIATRIC:  Denies anxiety or depressive symptoms.  Otherwise, full review of systems performed by me is negative.   PAST MEDICAL HISTORY: Peripheral vascular disease as well as gastroesophageal reflux disease.  SOCIAL HISTORY: Rule out tobacco abuse. Denies any alcohol or drug use.  FAMILY HISTORY: Positive for coronary artery disease.  ALLERGIES: LEVAQUIN AND SULFA DRUGS.   HOME MEDICATIONS: Include aspirin 81 mg p.o. q. daily, etodolac 400 mg p.o. b.i.d., Vicodin 1 to 2 tabs p.o. q. 6 hours as needed for pain, Cymbalta 60 mg p.o. q. daily, Lipitor 10 mg p.o. at bedtime, ProAir 90 mcg inhalation 2 puffs 4 times daily as needed for shortness of breath, Pletal 100 mg p.o. b.i.d., tetrahydrozoline ophthalmic solution 0.05% to each eye 4 times daily as needed, Prilosec 20 mg p.o. q. daily.   PHYSICAL EXAMINATION:  VITAL SIGNS: Temperature 98.7, heart rate 124, respirations 22, blood pressure 122/57, saturating 90% on room air. Weight 136.5 kg, BMI 47.2.  GENERAL: Well-nourished, well-developed, Caucasian female appearing in no acute distress.  HEAD: Normocephalic, atraumatic.  EYES: Pupils equal, round, reactive to light. Extraocular muscles intact. No scleral icterus.  MOUTH: Moist mucous membranes. Dentition intact. No abscess noted. EARS, NOSE, THROAT: Clear without exudates. No external lesions.  NECK: Supple. No thyromegaly. No nodules. No JVD.  PULMONARY: Clear to auscultation bilaterally without wheezes, rales, or rhonchi. No use of accessory muscles. Good respiratory effort.  CHEST: Nontender to palpation.  CARDIOVASCULAR: S1, S2, regular rate and rhythm. No murmurs, rubs, or gallops. No edema. Pedal pulses 2+ bilaterally. GASTROINTESTINAL: Soft, nontender, nondistended. No masses. Positive bowel sounds. No hepatosplenomegaly.  MUSCULOSKELETAL: No swelling, clubbing, or edema. Range of motion full in all extremities.  NEUROLOGIC: Cranial nerves II through XII intact. No gross focal neurological  deficits. Sensation intact. Reflexes intact.  SKIN: No  ulceration, lesions, rash, cyanosis. Skin warm, dry. Turgor intact.  PSYCHIATRIC: Mood and affect within normal limits. Patient awake, alert, oriented x 3. Insight and judgment are intact.   LABORATORY DATA: Sodium 130, potassium 3.2, chloride 103, bicarbonate 27, BUN 9, creatinine 1.04, glucose 137, albumin 3.3. Otherwise, LFTs within normal limits. WBC 14.3, hemoglobin of 13.6, platelets of 156,000.  URINALYSIS: WBC 880, RBC 3, leukocyte esterase 3+, nitrite positive, ASSESSMENT AND PLAN: A 64 year old Caucasian female with a history of peripheral vascular disease who underwent recent left lower extremity arterial stenting, presenting with fever.  1.  Sepsis secondary to urinary source, indicated by temperature and leukocytosis present on arrival as well as respiratory rate. Panculture blood, urine. Antibiotic coverage, ceftriaxone. Intravenous fluid hydration. Keep mean arterial pressure greater than 65.  2.  Hypokalemia. Replace with goal potassium 4-5.  3.  Peripheral vascular disease. Continue aspirin, statin, as well as Pletal. 4.  Venous thromboembolism prophylaxis with heparin subcutaneous.  CODE STATUS: Patient full code.   TIME SPENT: 35 minutes.    ____________________________ Aaron Mose. Hower, MD dkh:ST D: 04/07/2014 23:40:16 ET T: 04/08/2014 01:18:13 ET JOB#: [id number]  cc: Aaron Mose. Hower, MD, <Dictator> DAVID Woodfin Ganja MD ELECTRONICALLY SIGNED 04/08/2014 20:17

## 2014-11-17 ENCOUNTER — Ambulatory Visit
Admission: RE | Admit: 2014-11-17 | Discharge: 2014-11-17 | Disposition: A | Discharge status: Home / Self Care | Payer: Medicare PPO | Source: Ambulatory Visit | Attending: Cardiology | Admitting: Cardiology

## 2014-11-17 ENCOUNTER — Other Ambulatory Visit: Payer: Self-pay | Admitting: Cardiology

## 2014-11-17 DIAGNOSIS — R0602 Shortness of breath: Secondary | ICD-10-CM

## 2016-03-13 ENCOUNTER — Emergency Department (HOSPITAL_COMMUNITY)
Admission: EM | Admit: 2016-03-13 | Discharge: 2016-03-14 | Disposition: A | Discharge status: Home / Self Care | Payer: Medicare Other | Attending: Emergency Medicine | Admitting: Emergency Medicine

## 2016-03-13 ENCOUNTER — Encounter (HOSPITAL_COMMUNITY): Payer: Self-pay | Admitting: Physical Medicine and Rehabilitation

## 2016-03-13 DIAGNOSIS — R1011 Right upper quadrant pain: Secondary | ICD-10-CM | POA: Insufficient documentation

## 2016-03-13 DIAGNOSIS — Z7982 Long term (current) use of aspirin: Secondary | ICD-10-CM | POA: Insufficient documentation

## 2016-03-13 DIAGNOSIS — F17210 Nicotine dependence, cigarettes, uncomplicated: Secondary | ICD-10-CM | POA: Insufficient documentation

## 2016-03-13 HISTORY — DX: Hyperlipidemia, unspecified: E78.5

## 2016-03-13 LAB — COMPREHENSIVE METABOLIC PANEL
ALT: 16 U/L (ref 14–54)
AST: 18 U/L (ref 15–41)
Albumin: 3.8 g/dL (ref 3.5–5.0)
Alkaline Phosphatase: 63 U/L (ref 38–126)
Anion gap: 9 (ref 5–15)
BUN: 8 mg/dL (ref 6–20)
CHLORIDE: 104 mmol/L (ref 101–111)
CO2: 27 mmol/L (ref 22–32)
Calcium: 9.6 mg/dL (ref 8.9–10.3)
Creatinine, Ser: 0.84 mg/dL (ref 0.44–1.00)
GFR calc non Af Amer: >60 mL/min (ref >60)
Glucose, Bld: 98 mg/dL (ref 65–99)
POTASSIUM: 3.9 mmol/L (ref 3.5–5.1)
Sodium: 140 mmol/L (ref 135–145)
Total Bilirubin: 0.7 mg/dL (ref 0.3–1.2)
Total Protein: 7.4 g/dL (ref 6.5–8.1)

## 2016-03-13 LAB — CBC WITH DIFFERENTIAL/PLATELET
BASOS ABS: 0 10*3/uL (ref 0.0–0.1)
Basophils Relative: 0 %
EOS ABS: 0 10*3/uL (ref 0.0–0.7)
EOS PCT: 1 %
HCT: 49.4 % — ABNORMAL HIGH (ref 36.0–46.0)
Hemoglobin: 15.8 g/dL — ABNORMAL HIGH (ref 12.0–15.0)
LYMPHS PCT: 25 %
Lymphs Abs: 2.1 10*3/uL (ref 0.7–4.0)
MCH: 31.2 pg (ref 26.0–34.0)
MCHC: 32 g/dL (ref 30.0–36.0)
MCV: 97.4 fL (ref 78.0–100.0)
MONO ABS: 0.5 10*3/uL (ref 0.1–1.0)
Monocytes Relative: 6 %
Neutro Abs: 5.7 10*3/uL (ref 1.7–7.7)
Neutrophils Relative %: 68 %
PLATELETS: 212 10*3/uL (ref 150–400)
RBC: 5.07 MIL/uL (ref 3.87–5.11)
RDW: 13.4 % (ref 11.5–15.5)
WBC: 8.5 10*3/uL (ref 4.0–10.5)

## 2016-03-13 LAB — URINALYSIS, ROUTINE W REFLEX MICROSCOPIC
Bilirubin Urine: NEGATIVE
Glucose, UA: NEGATIVE mg/dL
Hgb urine dipstick: NEGATIVE
Ketones, ur: NEGATIVE mg/dL
LEUKOCYTES UA: NEGATIVE
Nitrite: NEGATIVE
Protein, ur: NEGATIVE mg/dL
Specific Gravity, Urine: 1.017 (ref 1.005–1.030)
pH: 6 (ref 5.0–8.0)

## 2016-03-13 LAB — LIPASE, BLOOD: Lipase: 22 U/L (ref 11–51)

## 2016-03-13 MED ORDER — SODIUM CHLORIDE 0.9 % IV SOLN
Freq: Once | INTRAVENOUS | Status: AC
  Administered 2016-03-13: 22:00:00 via INTRAVENOUS

## 2016-03-13 NOTE — ED Notes (Signed)
EDNP in to see pt, at [facility].

## 2016-03-13 NOTE — ED Notes (Signed)
Up to b/r, steady gait with cane.

## 2016-03-13 NOTE — ED Notes (Signed)
Sitting on edge of bed, calm, NAD, alert, ready for CT, CT notified by phone.

## 2016-03-13 NOTE — ED Triage Notes (Signed)
Pt reports R sided abdominal pain x2 weeks. Also states nausea and diarrhea. Also states she feels like abdomen is bigger than normal.

## 2016-03-13 NOTE — ED Provider Notes (Signed)
[facility] DEPT Provider Note   CSN: [id number] Arrival date & time: 03/13/16  1522     History   Chief Complaint Chief Complaint  Patient presents with  . Abdominal Pain    HPI Trenton Verne is a 65 y.o. female.  This is a morbidly obese female who states that she's had right upper quadrant abdominal swelling and discomfort.  She's having normal bowel movements.  She is at not having any nausea or vomiting has been present for approximately a week and half.  She's tried see her primary care physician, but unfortunately her physician has been out on medical leave.  She is in the emergency room tonight just because the pain/his comfort has gotten worse Patient states that she is in a cholesterol drug study the past 6 months.  She states that when she started having discomfort.  She stopped taking the medication.  The drug study is aware.      Past Medical History:  Diagnosis Date  . Acid reflux   . Arthritis   . Hyperlipemia     Patient Active Problem List   Diagnosis Date Noted  . Claudication in peripheral vascular disease ([place]) 04/04/2014  . Obesity hypoventilation syndrome ([place]) 04/04/2014  . GERD (gastroesophageal reflux disease) 04/04/2014    Past Surgical History:  Procedure Laterality Date  . CHOLECYSTECTOMY    . KNEE ARTHROSCOPY     bilateral  . LOWER EXTREMITY ANGIOGRAM N/A 04/04/2014   Procedure: LOWER EXTREMITY ANGIOGRAM;  Surgeon: Laverda Page, MD;  Location: [facility] CATH LAB;  Service: Cardiovascular;  Laterality: N/A;    OB History    No data available       Home Medications    Prior to Admission medications   Medication Sig Start Date End Date Taking? Authorizing Provider  albuterol (PROAIR HFA) 108 (90 BASE) MCG/ACT inhaler Inhale 2 puffs into the lungs every 6 (six) hours as needed for wheezing or shortness of breath.   Yes Historical Provider, MD  aspirin 81 MG chewable tablet Chew 81 mg by mouth daily.   Yes Historical Provider,  MD  cilostazol (PLETAL) 100 MG tablet Take 100 mg by mouth 2 (two) times daily.   Yes Historical Provider, MD  omeprazole (PRILOSEC) 20 MG capsule Take 20 mg by mouth daily.   Yes Historical Provider, MD  rosuvastatin (CRESTOR) 10 MG tablet Take 10 mg by mouth at bedtime. 12/18/15  Yes Historical Provider, MD  Tetrahydroz-Dextran-PEG-Povid (EYE DROPS ADVANCED RELIEF) 0.05-0.1-1-1 % SOLN Place 1 drop into both eyes daily as needed (dryness).    Yes Historical Provider, MD  HYDROcodone-acetaminophen (NORCO/VICODIN) 5-325 MG tablet Take 1 tablet by mouth every 6 (six) hours as needed for severe pain. 03/14/16   Junius Creamer, NP    Family History No family history on file.  Social History Social History  Substance Use Topics  . Smoking status: Current Every Day Smoker    Types: Cigarettes  . Smokeless tobacco: Never Used  . Alcohol use No     Allergies   Sulfur   Review of Systems Review of Systems  Constitutional: Negative for fever.  Respiratory: Negative for shortness of breath.   Cardiovascular: Negative for chest pain.  Gastrointestinal: Positive for abdominal distention and abdominal pain. Negative for constipation, diarrhea and nausea.  Genitourinary: Negative for dysuria.  All other systems reviewed and are negative.    Physical Exam Updated Vital Signs BP 145/78   Pulse 84   Temp 98.7 F (37.1 C)  Resp 20   Ht '5\' 7"'$  (1.702 m)   Wt (!) 137 kg   SpO2 96%   BMI 47.30 kg/m   Physical Exam  Constitutional: She appears well-developed and well-nourished.  HENT:  Head: Normocephalic.  Eyes: Pupils are equal, round, and reactive to light.  Neck: Normal range of motion.  Cardiovascular: Normal rate.   Pulmonary/Chest: Effort normal.  Abdominal: Soft. Bowel sounds are normal. She exhibits no distension and no mass. There is no tenderness. There is no rebound. No hernia.  Musculoskeletal: Normal range of motion.  Neurological: She is alert.  Skin: Skin is warm.    Psychiatric: She has a normal mood and affect.  Nursing note and vitals reviewed.    ED Treatments / Results  Labs (all labs ordered are listed, but only abnormal results are displayed) Labs Reviewed  CBC WITH DIFFERENTIAL/PLATELET - Abnormal; Notable for the following:       Result Value   Hemoglobin 15.8 (*)    HCT 49.4 (*)    All other components within normal limits  COMPREHENSIVE METABOLIC PANEL  LIPASE, BLOOD  URINALYSIS, ROUTINE W REFLEX MICROSCOPIC (NOT AT [facility])    EKG  EKG Interpretation None       Radiology Ct Abdomen Pelvis W Contrast  Result Date: 03/14/2016 CLINICAL DATA:  Subacute onset of worsening right upper quadrant abdominal pain. Initial encounter. EXAM: CT ABDOMEN AND PELVIS WITH CONTRAST TECHNIQUE: Multidetector CT imaging of the abdomen and pelvis was performed using the standard protocol following bolus administration of intravenous contrast. CONTRAST:  100 mL ISOVUE-300 IOPAMIDOL (ISOVUE-300) INJECTION 61% COMPARISON:  Right upper quadrant ultrasound performed 05/27/2011, and MRI of the lumbar spine performed 09/23/2007 FINDINGS: Lower chest: Scattered coronary artery calcifications are seen. The visualized lung bases are grossly clear. Hepatobiliary: The liver is unremarkable in appearance. The patient is status post cholecystectomy, with clips noted at the gallbladder fossa. The common bile duct remains normal in caliber. Pancreas: The pancreas is within normal limits. Spleen: The spleen is unremarkable in appearance. Adrenals/Urinary Tract: Bilateral adrenal adenomas are noted, left larger than right, measuring up to 2.2 cm. Nonspecific perinephric stranding is noted bilaterally. The kidneys are otherwise unremarkable. There is no evidence of hydronephrosis. No renal or ureteral stones are seen. Stomach/Bowel: The stomach is unremarkable in appearance. The small bowel is within normal limits. The appendix is normal in caliber, without evidence of  appendicitis. Minimal diverticulosis is noted at the mid sigmoid colon, without evidence of diverticulitis. Vascular/Lymphatic: Scattered calcification is seen along the abdominal aorta and its branches. The abdominal aorta is otherwise grossly unremarkable. The inferior vena cava is grossly unremarkable. No retroperitoneal lymphadenopathy is seen. No pelvic sidewall lymphadenopathy is identified. Reproductive: The bladder is mildly distended. Bladder wall thickening and mild surrounding soft tissue inflammation raise concern for cystitis. The patient is status post hysterectomy. No suspicious adnexal masses are seen. Other: No additional soft tissue abnormalities are seen. Musculoskeletal: No acute osseous abnormalities are identified. Mild vacuum phenomenon is noted along the lumbar spine. The visualized musculature is unremarkable in appearance. IMPRESSION: 1. No definite acute abnormality seen to explain the patient's symptoms. 2. Bladder wall thickening and mild surrounding soft tissue inflammation raise concern for cystitis. Would correlate for any associated clinical findings. 3. Bilateral adrenal adenomas, left larger than right, measuring up to 2.2 cm. 4. Scattered coronary artery calcifications seen. 5. Minimal diverticulosis at the mid sigmoid colon, without evidence of diverticulitis. Electronically Signed   By: Garald Balding M.D.   On:  03/14/2016 01:21    Procedures Procedures (including critical care time)  Medications Ordered in ED Medications  0.9 %  sodium chloride infusion ( Intravenous New Bag/Given 03/13/16 2207)  iopamidol (ISOVUE-300) 61 % injection (100 mLs  Contrast Given 03/14/16 0016)     Initial Impression / Assessment and Plan / ED Course  I have reviewed the triage vital signs and the nursing notes.  Pertinent labs & imaging results that were available during my care of the patient were reviewed by me and considered in my medical decision making (see chart for  details).  Clinical Course     CT scan reviewed revealing no pathology to explain patient's symptoms.  Labs are reviewed as well.  Liver function studies within normal parameters.  She's been instructed to keep a written diary of her symptoms and follow-up with her primary care physician  Final Clinical Impressions(s) / ED Diagnoses   Final diagnoses:  Right upper quadrant pain    New Prescriptions New Prescriptions   HYDROCODONE-ACETAMINOPHEN (NORCO/VICODIN) 5-325 MG TABLET    Take 1 tablet by mouth every 6 (six) hours as needed for severe pain.     Junius Creamer, NP 03/14/16 0132    Junius Creamer, NP 03/14/16 9326    Davonna Belling, MD 03/15/16 [phone]

## 2016-03-14 ENCOUNTER — Emergency Department (HOSPITAL_COMMUNITY): Payer: Medicare Other

## 2016-03-14 ENCOUNTER — Encounter (HOSPITAL_COMMUNITY): Payer: Self-pay | Admitting: Radiology

## 2016-03-14 MED ORDER — IOPAMIDOL (ISOVUE-300) INJECTION 61%
INTRAVENOUS | Status: AC
  Administered 2016-03-14: 100 mL
  Filled 2016-03-14: qty 100

## 2016-03-14 MED ORDER — HYDROCODONE-ACETAMINOPHEN 5-325 MG PO TABS: 1.0000 | ORAL_TABLET | Freq: Four times a day (QID) | ORAL | 0 refills | Status: DC | PRN

## 2016-03-14 NOTE — ED Notes (Signed)
Back from CT by w/c, no changes, alert, NAD, calm, interactive.

## 2016-03-14 NOTE — Discharge Instructions (Signed)
Your CT scan does not reveal any abnormality to explain your symptoms.  Please make a point with your primary care physician as needed.  For further evaluation.  You've been given a prescription for Vicodin that you can use for severe pain as needed

## 2016-03-14 NOTE — ED Notes (Signed)
EDNP in to see pt at time of d/c

## 2016-08-19 ENCOUNTER — Encounter: Payer: Self-pay | Admitting: Gastroenterology

## 2016-09-19 ENCOUNTER — Encounter: Payer: Self-pay | Admitting: Gastroenterology

## 2016-09-19 ENCOUNTER — Ambulatory Visit (INDEPENDENT_AMBULATORY_CARE_PROVIDER_SITE_OTHER): Payer: Medicare Other | Admitting: Gastroenterology

## 2016-09-19 VITALS — BP 122/74 | HR 80 | Ht 66.0 in | Wt 315.0 lb

## 2016-09-19 DIAGNOSIS — K219 Gastro-esophageal reflux disease without esophagitis: Secondary | ICD-10-CM | POA: Diagnosis not present

## 2016-09-19 DIAGNOSIS — D3500 Benign neoplasm of unspecified adrenal gland: Secondary | ICD-10-CM | POA: Diagnosis not present

## 2016-09-19 DIAGNOSIS — R194 Change in bowel habit: Secondary | ICD-10-CM | POA: Diagnosis not present

## 2016-09-19 DIAGNOSIS — Z1211 Encounter for screening for malignant neoplasm of colon: Secondary | ICD-10-CM

## 2016-09-19 DIAGNOSIS — R1011 Right upper quadrant pain: Secondary | ICD-10-CM | POA: Diagnosis not present

## 2016-09-19 MED ORDER — NA SULFATE-K SULFATE-MG SULF 17.5-3.13-1.6 GM/177ML PO SOLN: 1.0000 | Freq: Once | ORAL | 0 refills | Status: AC

## 2016-09-19 MED ORDER — DICYCLOMINE HCL 10 MG PO CAPS: 10.0000 mg | ORAL_CAPSULE | Freq: Three times a day (TID) | ORAL | 3 refills | Status: DC

## 2016-09-19 NOTE — Progress Notes (Signed)
 HPI :  66 y/o female with a PVD with stenting in her leg since 2015, morbid obesity, arthritis, here for a new patient visit for altered bowel habit problems.  She reports she has been having some loose stools predominantly, sometimes with urgency, rarely has any solid stools. She reports symptoms ongoing for 6 months or more. She is having roughly 2 BMs per day, sometimes 3-4. No blood in the stools. No new medications. She denies routine NSAID use. She takes pantoprazole 20mg once daily, controls heartburn. No dysphagia. She has had heartburn symptoms for years. No trials of immodium.  She also hs some discomfort in her RUQ. She was seen in the ER in September which did not show any pathology. She states it has been ongoing for 6 months or so, mild 1/10 or so. It comes and goes. Sometimes worse after eating, sometimes not. She is not sure if having a bowel movement changes it. Nothing makes it better or worse that she can think of. She has discomfort more so when standing and washing dishes, experiences it a few days per week, lasts minutes at a time. No nausea or vomiting. No weight loss.   No prior colonoscopy. No FH of colon cancer. No gastric or esophageal cancer. No prior upper endoscopy.  CT abdomen 03/14/16 - bladder wall thickening, ? Cystitis, bilateral adrenal adenomas, scattered coronary artery calfcifications, sigmoid diverticulosis  Patient states she has had adrenal adenoma workup up by endocrine a few years ago and thought to be nonfunctional.  Past Medical History:  Diagnosis Date  . Acid reflux   . Arthritis   . Cholecystitis   . Depression   . Hyperlipemia   . Obesity   . Peripheral vascular disease ([facility])   . Pneumonia      Past Surgical History:  Procedure Laterality Date  . ABDOMINAL HYSTERECTOMY    . CESAREAN SECTION    . CHOLECYSTECTOMY    . KNEE ARTHROSCOPY     bilateral  . LOWER EXTREMITY ANGIOGRAM N/A 04/04/2014   Procedure: LOWER EXTREMITY ANGIOGRAM;   Surgeon: Jagadeesh R Ganji, MD;  Location: [facility] CATH LAB;  Service: Cardiovascular;  Laterality: N/A;  . ROTATOR CUFF REPAIR    . TONSILLECTOMY    . WRIST SURGERY     Family History  Problem Relation Age of Onset  . Heart disease Mother   . Lung cancer Father   . Heart disease Father   . Heart disease Brother    Social History  Substance Use Topics  . Smoking status: Current Every Day Smoker    Types: Cigarettes  . Smokeless tobacco: Never Used  . Alcohol use No   Current Outpatient Prescriptions  Medication Sig Dispense Refill  . albuterol (PROAIR HFA) 108 (90 BASE) MCG/ACT inhaler Inhale 2 puffs into the lungs every 6 (six) hours as needed for wheezing or shortness of breath.    . aspirin 81 MG chewable tablet Chew 81 mg by mouth daily.    . cilostazol (PLETAL) 100 MG tablet Take 100 mg by mouth 2 (two) times daily.    . Multiple Vitamins-Minerals (PRESERVISION AREDS 2 PO) Take by mouth.    . omeprazole (PRILOSEC) 20 MG capsule Take 20 mg by mouth daily.    . rosuvastatin (CRESTOR) 10 MG tablet Take 10 mg by mouth at bedtime.    . Tetrahydroz-Dextran-PEG-Povid (EYE DROPS ADVANCED RELIEF) 0.05-0.1-1-1 % SOLN Place 1 drop into both eyes daily as needed (dryness).     . dicyclomine (BENTYL)   10 MG capsule Take 1 capsule (10 mg total) by mouth every 8 (eight) hours. 30 capsule 3  . Na Sulfate-K Sulfate-Mg Sulf 17.5-3.13-1.6 GM/180ML SOLN Take 1 kit by mouth once. 354 mL 0   No current facility-administered medications for this visit.    Allergies  Allergen Reactions  . Sulfur Hives     Review of Systems: All systems reviewed and negative except where noted in HPI.   Lab Results  Component Value Date   WBC 8.5 03/13/2016   HGB 15.8 (H) 03/13/2016   HCT 49.4 (H) 03/13/2016   MCV 97.4 03/13/2016   PLT 212 03/13/2016    Lab Results  Component Value Date   CREATININE 0.84 03/13/2016   BUN 8 03/13/2016   NA 140 03/13/2016   K 3.9 03/13/2016   CL 104 03/13/2016   CO2 27  03/13/2016    Lab Results  Component Value Date   ALT 16 03/13/2016   AST 18 03/13/2016   ALKPHOS 63 03/13/2016   BILITOT 0.7 03/13/2016     Physical Exam: BP 122/74   Pulse 80   Ht 5' 6" (1.676 m)   Wt (!) 315 lb (142.9 kg)   BMI 50.84 kg/m ,  Constitutional: Pleasant, female in no acute distress, walks with cane HEENT: Normocephalic and atraumatic. Conjunctivae are normal. No scleral icterus. Neck supple.  Cardiovascular: Normal rate, regular rhythm.  Pulmonary/chest: Effort normal and breath sounds normal. No wheezing, rales or rhonchi. Abdominal: Soft, protuberant / obese abdomen, nontender to palpation with a negative [place].  There are no masses palpable.  Extremities: no edema Lymphadenopathy: No cervical adenopathy noted. Neurological: Alert and oriented to person place and time. Skin: Skin is warm and dry. No rashes noted. Psychiatric: Normal mood and affect. Behavior is normal.   ASSESSMENT AND PLAN: 66 year old female here for a new patient visit to address the following issues  Change in bowel habits / colon cancer screening - ongoing loose stools for 6 months. No clear medications to be related to this. Given her lack of prior colon cancer screening I recommend colonoscopy to initially evaluate this issue as well as to perform her colon cancer screening. Given her morbid obesity she is at higher risk for anesthesia related complications, and her case was be done at the hospital. I discussed the risks and benefits of endoscopy and anesthesia with her and she wished proceed. She'll need to hold Pletal for at least 2 days prior to the procedure to minimize bleeding risk. In the interim she can try some Imodium to use as needed for her loose stools, doubt infectious at this point. We'll obtain most recent labs from her primary care's office to ensure normal.  RUQ discomfort - negative CT imaging for cause. Unclear if this is musculoskeletal, could not reproduce it on  exam. Given her history of reflux and this symptom I offered her an upper endoscopy to ensure no peptic ulcer disease/gastritis, perform Barrett's screening given her chronic reflux symptoms and abdominal obesity. She agreed to proceed with this. In the interim we'll increase her Protonix at 20 mg twice daily to see if this helps at all. We'll also give her an empiric trial of Bentyl to use as needed  GERD - as above, screening EGD for Barrett's in increasing Protonix to 20 mg twice daily.  Adrenal adenoma - noted on imaging, she reports remote workup for this and negative however recommend she follow-up with her primary care to determine if any reevaluation is needed.  Steven Armbruster, MD [facility] Pager [phone]  

## 2016-09-19 NOTE — Patient Instructions (Addendum)
If you are age 66 or older, your body mass index should be between 23-30. Your Body mass index is 50.84 kg/m. If this is out of the aforementioned range listed, please consider follow up with your Primary Care Provider.  If you are age 68 or younger, your body mass index should be between 19-25. Your Body mass index is 50.84 kg/m. If this is out of the aformentioned range listed, please consider follow up with your Primary Care Provider.   We have sent the following medications to your pharmacy for you to pick up at your convenience:  [facility] have been scheduled for a colonoscopy. Please follow written instructions given to you at your visit today.  Please pick up your prep supplies at the pharmacy within the next 1-3 days. If you use inhalers (even only as needed), please bring them with you on the day of your procedure. Your physician has requested that you go to www.startemmi.com and enter the access code given to you at your visit today. This web site gives a general overview about your procedure. However, you should still follow specific instructions given to you by our office regarding your preparation for the procedure.  Please increase you Pantoprazole to '20mg'$  twice daily  Please use Immodium as needed.  We will contact you in regards to discontinuing you anticoagulant. If you have not heard from [place] in 1 week please call us back to clarify.      Thank you.

## 2016-09-23 ENCOUNTER — Telehealth: Payer: Self-pay | Admitting: Gastroenterology

## 2016-09-23 NOTE — Telephone Encounter (Signed)
Labs from patient's primary care came in as follows:  WBC 7.5, Hgb 15.7, HCT 47.7, plt 212, MCV 92  BUN 7, CR 0.7  Alp 83, ALT 15, AST 16, T bil 0.2  HCV AB (-)  TSH 0.566  Will await results of EGD and colonoscopy

## 2016-09-24 ENCOUNTER — Other Ambulatory Visit: Payer: Self-pay | Admitting: Radiology

## 2016-09-24 ENCOUNTER — Telehealth: Payer: Self-pay

## 2016-09-24 NOTE — Telephone Encounter (Signed)
Dr. Einar Gip faxed response stating that pt may d/c her Cilostazol 6 days prior to her procedure. Also, pt should not resume the medication for 4-6 days if there is a biopsy. If no biopsy should restart medication same day.

## 2016-09-26 ENCOUNTER — Telehealth: Payer: Self-pay | Admitting: *Deleted

## 2016-09-26 NOTE — Telephone Encounter (Signed)
Confirmed [facility] for 10/01/16 at 0815 .  Instructions and contact information given.

## 2016-09-30 ENCOUNTER — Other Ambulatory Visit: Payer: Self-pay

## 2016-09-30 ENCOUNTER — Other Ambulatory Visit: Payer: Self-pay | Admitting: *Deleted

## 2016-09-30 ENCOUNTER — Encounter: Payer: Self-pay | Admitting: *Deleted

## 2016-09-30 DIAGNOSIS — C50412 Malignant neoplasm of upper-outer quadrant of left female breast: Secondary | ICD-10-CM

## 2016-09-30 DIAGNOSIS — Z170 Estrogen receptor positive status [ER+]: Secondary | ICD-10-CM

## 2016-09-30 HISTORY — DX: Estrogen receptor positive status (ER+): Z17.0

## 2016-09-30 HISTORY — DX: Malignant neoplasm of upper-outer quadrant of left female breast: C50.412

## 2016-10-01 ENCOUNTER — Encounter: Payer: Self-pay | Admitting: Physical Therapy

## 2016-10-01 ENCOUNTER — Ambulatory Visit: Payer: Medicare Other | Attending: General Surgery | Admitting: Physical Therapy

## 2016-10-01 ENCOUNTER — Encounter: Payer: Self-pay | Admitting: *Deleted

## 2016-10-01 ENCOUNTER — Other Ambulatory Visit (HOSPITAL_BASED_OUTPATIENT_CLINIC_OR_DEPARTMENT_OTHER): Payer: Medicare Other

## 2016-10-01 ENCOUNTER — Encounter: Payer: Self-pay | Admitting: Oncology

## 2016-10-01 ENCOUNTER — Ambulatory Visit (HOSPITAL_BASED_OUTPATIENT_CLINIC_OR_DEPARTMENT_OTHER): Payer: Medicare Other | Admitting: Oncology

## 2016-10-01 ENCOUNTER — Other Ambulatory Visit: Payer: Self-pay

## 2016-10-01 ENCOUNTER — Ambulatory Visit
Admission: RE | Admit: 2016-10-01 | Discharge: 2016-10-01 | Disposition: A | Discharge status: Home / Self Care | Payer: Medicare Other | Source: Ambulatory Visit | Attending: Radiation Oncology | Admitting: Radiation Oncology

## 2016-10-01 VITALS — BP 133/73 | HR 96 | Temp 98.5°F | Ht 64.0 in | Wt 322.0 lb

## 2016-10-01 DIAGNOSIS — Z170 Estrogen receptor positive status [ER+]: Secondary | ICD-10-CM | POA: Diagnosis not present

## 2016-10-01 DIAGNOSIS — Z9889 Other specified postprocedural states: Secondary | ICD-10-CM | POA: Insufficient documentation

## 2016-10-01 DIAGNOSIS — Z888 Allergy status to other drugs, medicaments and biological substances status: Secondary | ICD-10-CM | POA: Insufficient documentation

## 2016-10-01 DIAGNOSIS — C50412 Malignant neoplasm of upper-outer quadrant of left female breast: Secondary | ICD-10-CM

## 2016-10-01 DIAGNOSIS — Z79891 Long term (current) use of opiate analgesic: Secondary | ICD-10-CM | POA: Insufficient documentation

## 2016-10-01 DIAGNOSIS — Z720 Tobacco use: Secondary | ICD-10-CM

## 2016-10-01 DIAGNOSIS — R293 Abnormal posture: Secondary | ICD-10-CM

## 2016-10-01 DIAGNOSIS — Z7982 Long term (current) use of aspirin: Secondary | ICD-10-CM | POA: Insufficient documentation

## 2016-10-01 DIAGNOSIS — Z79899 Other long term (current) drug therapy: Secondary | ICD-10-CM | POA: Insufficient documentation

## 2016-10-01 LAB — COMPREHENSIVE METABOLIC PANEL
ALBUMIN: 3.3 g/dL — AB (ref 3.5–5.0)
ALT: 12 U/L (ref 0–55)
AST: 14 U/L (ref 5–34)
Alkaline Phosphatase: 65 U/L (ref 40–150)
Anion Gap: 11 mEq/L (ref 3–11)
BUN: 9 mg/dL (ref 7.0–26.0)
CHLORIDE: 102 meq/L (ref 98–109)
CO2: 27 mEq/L (ref 22–29)
CREATININE: 0.8 mg/dL (ref 0.6–1.1)
Calcium: 9.3 mg/dL (ref 8.4–10.4)
EGFR: 78 mL/min/{1.73_m2} — ABNORMAL LOW (ref >90)
GLUCOSE: 116 mg/dL (ref 70–140)
Potassium: 4.1 mEq/L (ref 3.5–5.1)
SODIUM: 140 meq/L (ref 136–145)
Total Bilirubin: 0.53 mg/dL (ref 0.20–1.20)
Total Protein: 6.6 g/dL (ref 6.4–8.3)

## 2016-10-01 LAB — CBC WITH DIFFERENTIAL/PLATELET
BASO%: 0.3 % (ref 0.0–2.0)
Basophils Absolute: 0 10*3/uL (ref 0.0–0.1)
EOS%: 2.1 % (ref 0.0–7.0)
Eosinophils Absolute: 0.1 10*3/uL (ref 0.0–0.5)
HEMATOCRIT: 45.6 % (ref 34.8–46.6)
HEMOGLOBIN: 14.7 g/dL (ref 11.6–15.9)
LYMPH#: 2.1 10*3/uL (ref 0.9–3.3)
LYMPH%: 30.1 % (ref 14.0–49.7)
MCH: 31.3 pg (ref 25.1–34.0)
MCHC: 32.2 g/dL (ref 31.5–36.0)
MCV: 97 fL (ref 79.5–101.0)
MONO#: 0.5 10*3/uL (ref 0.1–0.9)
MONO%: 7.4 % (ref 0.0–14.0)
NEUT#: 4.1 10*3/uL (ref 1.5–6.5)
NEUT%: 60.1 % (ref 38.4–76.8)
Platelets: 176 10*3/uL (ref 145–400)
RBC: 4.7 10*6/uL (ref 3.70–5.45)
RDW: 14.7 % — AB (ref 11.2–14.5)
WBC: 6.8 10*3/uL (ref 3.9–10.3)

## 2016-10-01 MED ORDER — KETOCONAZOLE 2 % EX CREA
1.0000 | TOPICAL_CREAM | Freq: Every day | CUTANEOUS | 0 refills | Status: DC
Start: 2016-10-01 — End: 2019-10-17
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
## 2016-10-01 NOTE — Progress Notes (Signed)
[place]  [phone] [phone]     ID: Daisy [name] DOB: Jun 21, 1950  MR#: [id number]  JSE#:[id number]  Patient Care Team: Alvester Chou, NP as PCP - General (Nurse Practitioner) Rolm Bookbinder, MD as Consulting Physician (General Surgery) Chauncey Cruel, MD as Consulting Physician (Oncology) Gery Pray, MD as Consulting Physician (Radiation Oncology) Lorelle Gibbs, MD (Radiology) Manus Gunning, MD as Consulting Physician (Gastroenterology) Adrian Prows, MD as Consulting Physician (Cardiology) Earlie Server, MD as Consulting Physician (Orthopedic Surgery) Chauncey Cruel, MD OTHER MD:  CHIEF COMPLAINT: Estrogen receptor positive breast cancer  CURRENT TREATMENT: Awaiting definitive surgery   BREAST CANCER HISTORY: Daisy [name] had routine screening mammography at [facility] 09/17/2016. The breast density was category A. At the 12:00 location anteriorly in the left breast there was an area of linear calcifications measuring 0.3 cm. There was also an area of architectural distortion in the left breast upper outer quadrant. On 09/23/2016 the patient underwent left diagnostic mammography and this confirmed an area of calcifications and architectural distortion. Accordingly on 09/24/2016 the patient underwent left breast ultrasonography which did not show any abnormality in the left breast or left axilla.  On 09/24/2016 the patient underwent biopsy of the left breast area of architectural distortion and this showed ([id number]) invasive ductal carcinoma, grade 1, estrogen receptor 100% positive, and progesterone receptor 100% positive, both with strong staining intensity, with an MIB-1 of 5%, and no HER-2 amplification, the signals ratio being 1.51 and the number per cell 2.65.  The patient's subsequent history is as detailed below.  INTERVAL HISTORY: Daisy [name] was evaluated in the multidisciplinary breast cancer clinic 10/01/2017  accompanied by her daughters Daisy [name] and Daisy [name]. Her case was also presented in the multidisciplinary breast cancer conference that same morning. At that time a preliminary plan was proposed: Consideration of breast MRI and consideration of biopsy of her suspicious calcifications; breast conserving surgery with sentinel lymph node sampling, Oncotype, adjuvant radiation and hormones.  REVIEW OF SYSTEMS: There were no specific symptoms leading to the original mammogram, which was routinely scheduled. The patient denies unusual headaches, visual changes, nausea, vomiting, stiff neck, dizziness, or gait imbalance. There has been no cough, phlegm production, or pleurisy, no chest pain or pressure, and no change in bowel or bladder habits. The patient denies fever, rash, bleeding, unexplained fatigue or unexplained weight loss. She has significant problems with her knees, back, and feet, we could've occasional ankle swelling. She has easy bruising secondary to her anticoagulation. A detailed review of systems was otherwise entirely negative.  PAST MEDICAL HISTORY: Past Medical History:  Diagnosis Date  . Acid reflux   . Arthritis   . Cholecystitis   . Depression   . Hyperlipemia   . Malignant neoplasm of upper-outer quadrant of left breast in female, estrogen receptor positive ([place]) 09/30/2016  . Obesity   . Peripheral vascular disease ([place])   . Pneumonia     PAST SURGICAL HISTORY: Past Surgical History:  Procedure Laterality Date  . ABDOMINAL HYSTERECTOMY    . CESAREAN SECTION    . CHOLECYSTECTOMY    . KNEE ARTHROSCOPY     bilateral  . LOWER EXTREMITY ANGIOGRAM N/A 04/04/2014   Procedure: LOWER EXTREMITY ANGIOGRAM;  Surgeon: Laverda Page, MD;  Location: [facility] CATH LAB;  Service: Cardiovascular;  Laterality: N/A;  . ROTATOR CUFF REPAIR    . TONSILLECTOMY    . WRIST SURGERY      FAMILY HISTORY Family History  Problem Relation Age of Onset  .  Heart disease Mother   . Lung cancer Father    . Heart disease Father   . Heart disease Brother   The patient's father died at age 58 from lung cancer in the setting of tobacco abuse. The patient's mother died at the age of 61 from pneumonia. The patient has 3 brothers, 1 sister. There is no history of breast or ovarian cancer in the family.  GYNECOLOGIC HISTORY:  No LMP recorded. Patient has had a hysterectomy. Menarche age 53, first live birth age 12. The patient is GX P2. She underwent simple hysterectomy without salpingo-oophorectomy in 1986. She did not take hormone replacement. She did use oral contraceptives remotely for about 5 years, with no complications.  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 At home she lives with her daughter Daisy [name], who is a paramedic and their dog Daisy [name]. Daughter Daisy [name] lives in [place] and works as a Gaffer. The patient has no grandchildren. She is a Psychologist, forensic.    ADVANCED DIRECTIVES: Not in place. The patient was given the appropriate documents to complete and notarize at her discretion in course of her 10/01/2016 visit   HEALTH MAINTENANCE: Social History  Substance Use Topics  . Smoking status: Current Every Day Smoker    Types: Cigarettes  . Smokeless tobacco: Never Used  . Alcohol use No     Colonoscopy:Never  PAP:  Bone density: Never   Allergies  Allergen Reactions  . Sulfur Hives    Current Outpatient Prescriptions  Medication Sig Dispense Refill  . aspirin 81 MG chewable tablet Chew 81 mg by mouth daily.    . cilostazol (PLETAL) 100 MG tablet Take 100 mg by mouth 2 (two) times daily.    Marland Kitchen dicyclomine (BENTYL) 10 MG capsule Take 1 capsule (10 mg total) by mouth every 8 (eight) hours. 30 capsule 3  . pantoprazole (PROTONIX) 20 MG tablet Take 20 mg by mouth daily.    . rosuvastatin (CRESTOR) 10 MG tablet Take 10 mg by mouth at bedtime.    Daisy [name] (EYE DROPS ADVANCED RELIEF) 0.05-0.1-1-1 % SOLN Place 1 drop into both  eyes 2 (two) times daily.     Marland Kitchen albuterol (PROAIR HFA) 108 (90 BASE) MCG/ACT inhaler Inhale 2 puffs into the lungs every 6 (six) hours as needed for wheezing or shortness of breath.    . ketoconazole (NIZORAL) 2 % cream Apply 1 application topically daily. 15 g 0  . Multiple Vitamins-Minerals (PRESERVISION AREDS 2 PO) Take by mouth.    Marland Kitchen omeprazole (PRILOSEC) 20 MG capsule Take 20 mg by mouth daily.     No current facility-administered medications for this visit.     OBJECTIVE: Middle-aged white woman who appears stated age 28:   10/01/16 0849  BP: 133/73  Pulse: 96  Temp: 98.5 F (36.9 C)     Body mass index is 55.27 kg/m.    ECOG FS:1 - Symptomatic but completely ambulatory  Ocular: Sclerae unicteric, pupils equal, round and reactive to light Ear-nose-throat: Oropharynx clear and moist Lymphatic: No cervical or supraclavicular adenopathy Lungs no rales or rhonchi, good excursion bilaterally Heart regular rate and rhythm, no murmur appreciated Abd soft, nontender, positive bowel sounds MSK no focal spinal tenderness, no joint edema Neuro: non-focal, well-oriented, appropriate affect Breasts: The right breast is unremarkable. The left breast is status post recent biopsy. I do not palpate a well-defined mass. There are no skin or nipple changes of concern. The left axilla is benign.  LAB RESULTS:  CMP     Component Value Date/Time   NA 140 10/01/2016 0826   K 4.1 10/01/2016 0826   CL 104 03/13/2016 1603   CL 105 04/08/2014 0355   CO2 27 10/01/2016 0826   GLUCOSE 116 10/01/2016 0826   BUN 9.0 10/01/2016 0826   CREATININE 0.8 10/01/2016 0826   CALCIUM 9.3 10/01/2016 0826   PROT 6.6 10/01/2016 0826   ALBUMIN 3.3 (L) 10/01/2016 0826   AST 14 10/01/2016 0826   ALT 12 10/01/2016 0826   ALKPHOS 65 10/01/2016 0826   BILITOT 0.53 10/01/2016 0826   GFRNONAA >60 03/13/2016 1603   GFRNONAA 56 (L) 04/08/2014 0355   GFRAA >60 03/13/2016 1603   GFRAA >60 04/08/2014 0355     No results found for: Ronnald Ramp, A1GS, A2GS, BETS, BETA2SER, GAMS, MSPIKE, SPEI  No results found for: Nils Pyle, [facility]  Lab Results  Component Value Date   WBC 6.8 10/01/2016   NEUTROABS 4.1 10/01/2016   HGB 14.7 10/01/2016   HCT 45.6 10/01/2016   MCV 97.0 10/01/2016   PLT 176 10/01/2016      Chemistry      Component Value Date/Time   NA 140 10/01/2016 0826   K 4.1 10/01/2016 0826   CL 104 03/13/2016 1603   CL 105 04/08/2014 0355   CO2 27 10/01/2016 0826   BUN 9.0 10/01/2016 0826   CREATININE 0.8 10/01/2016 0826      Component Value Date/Time   CALCIUM 9.3 10/01/2016 0826   ALKPHOS 65 10/01/2016 0826   AST 14 10/01/2016 0826   ALT 12 10/01/2016 0826   BILITOT 0.53 10/01/2016 0826       No results found for: LABCA2  No components found for: [id number]  No results for input(s): INR in the last 168 hours.  Urinalysis    Component Value Date/Time   COLORURINE YELLOW 03/13/2016 2145   APPEARANCEUR CLEAR 03/13/2016 2145   APPEARANCEUR Cloudy 04/07/2014 2005   LABSPEC 1.017 03/13/2016 2145   LABSPEC 1.006 04/07/2014 2005   PHURINE 6.0 03/13/2016 2145   GLUCOSEU NEGATIVE 03/13/2016 2145   GLUCOSEU Negative 04/07/2014 2005   HGBUR NEGATIVE 03/13/2016 2145   BILIRUBINUR NEGATIVE 03/13/2016 2145   BILIRUBINUR Negative 04/07/2014 2005   KETONESUR NEGATIVE 03/13/2016 2145   PROTEINUR NEGATIVE 03/13/2016 2145   NITRITE NEGATIVE 03/13/2016 2145   LEUKOCYTESUR NEGATIVE 03/13/2016 2145   LEUKOCYTESUR 3+ 04/07/2014 2005     STUDIES: Outside films reviewed  ELIGIBLE FOR AVAILABLE RESEARCH PROTOCOL: no  ASSESSMENT: 66 y.o. [place], [place] woman Status post left breast upper outer quadrant biopsy 09/24/2016 of an area of architectural distortion measuring 3.7 cm, showing invasive ductal carcinoma, grade 1, estrogen and progesterone receptor positive, HER-2 nonamplified, with an MIB-1 of 5%.  (a) a 0.3 cm area of linear  calcifications in the superior aspect of the breast has not yet been biopsy  (1) breast conserving surgery with sentinel lymph node sampling pending.  (2) Oncotype DX to be obtained from the definitive surgical samples; chemotherapy not anticipated  (3) adjuvant radiation to follow  (4) to start and anastrozole at the completion of local treatment  (5) tobacco abuse: The patient has been strongly advised to discontinue smoking at this point  PLAN: We spent the better part of today's hour-long appointment discussing the biology of breast cancer in general, and the specifics of the patient's tumor in particular.We first reviewed the fact that cancer is not one disease but more than 100 different diseases and  that it is important to keep them separate-- otherwise when friends and relatives discuss their own cancer experiences with Gloris confusion can result. Similarly we explained that if breast cancer spreads to the bone or liver, the patient would not have bone cancer or liver cancer, but breast cancer in the bone and breast cancer in the liver: one cancer in three places-- not 3 different cancers which otherwise would have to be treated in 3 different ways.  We discussed the difference between local and systemic therapy. In terms of loco-regional treatment, lumpectomy plus radiation is equivalent to mastectomy as far as survival is concerned. For this reason, and because the cosmetic results are generally superior, we recommend breast conserving surgery. We also noted that in terms of sequencing of treatments, whether systemic therapy or surgery is done first does not affect the ultimate outcome.  We then discussed the rationale for systemic therapy. There is some risk that this cancer may have already spread to other parts of her body. Patients frequently ask at this point about bone scans, CAT scans and PET scans to find out if they have occult breast cancer somewhere else. The problem is that in  early stage disease we are much more likely to find false positives then true cancers and this would expose the patient to unnecessary procedures as well as unnecessary radiation. Scans cannot answer the question the patient really would like to know, which is whether she has microscopic disease elsewhere in her body. For those reasons we do not recommend them.  Of course we would proceed to aggressive evaluation of any symptoms that might suggest metastatic disease, but that is not the case here.  Next we went over the options for systemic therapy which are anti-estrogens, anti-HER-2 immunotherapy, and chemotherapy. S[place] does not meet criteria for anti-HER-2 immunotherapy. She is a good candidate for anti-estrogens.  The question of chemotherapy is more complicated. Chemotherapy is most effective in rapidly growing, aggressive tumors. It is much less effective in low-grade, slow growing cancers, like Eliabeth 's. For that reason we are going to request an Oncotype from the definitive surgical sample, as suggested by NCCN guidelines. That will help [place] make a definitive decision regarding chemotherapy in this case.  In short the overall plan is to start with breast conserving surgery, to include sentinel lymph node sampling, and obtaining an Oncotype DX from the definitive surgical sample. She will see me once those results are available area and at that time we can make a definitive decision regarding chemotherapy, but the patient is aware my expectation is she will not need chemotherapy or benefit from it.  She would then proceed directly to radiation. She will see me at the completion of radiation treatments and started anastrozole at that time.   Audrionna has a good understanding of the overall plan. She agrees with it. She knows the goal of treatment in her case is cure. She will call with any problems that may develop before her next visit here.  Chauncey Cruel, MD   10/04/2016 2:02 PM Medical  Oncology and Hematology [facility] [address] [place],  [address] Tel. [phone]    Fax. [phone]  We she is doing the is this will show is responding Mr. Rolena Infante is an interesting I yet but is okay just that was distant Breast biopsy

## 2016-10-01 NOTE — Progress Notes (Signed)
Radiation Oncology         [phone] ________________________________  Initial Outpatient Consultation  Name: Daisy [name] MRN: [id number]  Date: 10/01/2016  DOB: 1951-05-07  JI:RCVE, [name], NP  Rolm Bookbinder, MD   REFERRING PHYSICIAN: Rolm Bookbinder, MD  DIAGNOSIS:    ICD-9-CM ICD-10-CM   1. Malignant neoplasm of upper-outer quadrant of left breast in female, estrogen receptor positive ([place]) 174.4 C50.412    V86.0 Z17.0    Stage (T2, Nx, Mx) Left Breast UOQ Invasive Ductal Carcinoma, ER+ / PR+ / Her2-, Grade 1  CHIEF COMPLAINT: Here to discuss management of left breast cancer  HISTORY OF PRESENT ILLNESS::Daisy [name] is a 66 y.o. female who presented with an abnormal screening mammogram on 09/17/16. This revealed an area in the left breast of architectural distortion and a separate area of calcifications at the 12 o'clock position. Ultrasound on 09/24/16 revealed a 3.7 cm mass and 4 mm calcifications suspicious for malignancy. Biopsy on 09/24/16 showed invasive ductal carcinoma with characteristics as described above in the diagnosis. Receptor status is ER 100%, PR 100%, HER2-, Ki67 5%.  Patient is positive for pain in the knees and feet, glasses, irregular heartbeat, feet swelling, shortness of breath with walking, change in stool habits, bruise easily, back pain, joint pain, arthritis, and difficulty walking. PREVIOUS RADIATION THERAPY: No  PAST MEDICAL HISTORY:  has a past medical history of Acid reflux; Arthritis; Cholecystitis; Depression; Hyperlipemia; Malignant neoplasm of upper-outer quadrant of left breast in female, estrogen receptor positive ([place]) (09/30/2016); Obesity; Peripheral vascular disease ([place]); and Pneumonia.    PAST SURGICAL HISTORY: Past Surgical History:  Procedure Laterality Date  . ABDOMINAL HYSTERECTOMY    . CESAREAN SECTION    . CHOLECYSTECTOMY    . KNEE ARTHROSCOPY     bilateral  . LOWER EXTREMITY ANGIOGRAM N/A 04/04/2014     Procedure: LOWER EXTREMITY ANGIOGRAM;  Surgeon: Laverda Page, MD;  Location: [facility] C[facility] LAB;  Service: Cardiovascular;  Laterality: N/A;  . ROTATOR CUFF REPAIR    . TONSILLECTOMY    . WRIST SURGERY      FAMILY HISTORY: family history includes Heart disease in her brother, father, and mother; Lung cancer in her father.  SOCIAL HISTORY:  reports that she has been smoking Cigarettes.  She has never used smokeless tobacco. She reports that she does not drink alcohol or use drugs.  ALLERGIES: Sulfur  MEDICATIONS:  Current Outpatient Prescriptions  Medication Sig Dispense Refill  . albuterol (PROAIR HFA) 108 (90 BASE) MCG/ACT inhaler Inhale 2 puffs into the lungs every 6 (six) hours as needed for wheezing or shortness of breath.    Marland Kitchen aspirin 81 MG chewable tablet Chew 81 mg by mouth daily.    . cilostazol (PLETAL) 100 MG tablet Take 100 mg by mouth 2 (two) times daily.    Marland Kitchen dicyclomine (BENTYL) 10 MG capsule Take 1 capsule (10 mg total) by mouth every 8 (eight) hours. 30 capsule 3  . ketoconazole (NIZORAL) 2 % cream Apply 1 application topically daily. 15 g 0  . Multiple Vitamins-Minerals (PRESERVISION AREDS 2 PO) Take by mouth.    Marland Kitchen omeprazole (PRILOSEC) 20 MG capsule Take 20 mg by mouth daily.    . pantoprazole (PROTONIX) 20 MG tablet Take 20 mg by mouth daily.    . rosuvastatin (CRESTOR) 10 MG tablet Take 10 mg by mouth at bedtime.    Sallye Lat (EYE DROPS ADVANCED RELIEF) 0.05-0.1-1-1 % SOLN Place 1 drop into both eyes 2 (two) times daily.  No current facility-administered medications for this encounter.     REVIEW OF SYSTEMS: A 10+ POINT REVIEW OF SYSTEMS WAS OBTAINED including neurology, dermatology, psychiatry, cardiac, respiratory, lymph, extremities, GI, GU, Musculoskeletal, constitutional, breasts, reproductive, HEENT.  All pertinent positives are noted in the HPI.  All others are negative.   PHYSICAL EXAM:  Vitals with BMI 10/01/2016  Height _0    Weight 322 lbs  BMI 22.0  Systolic 254  Diastolic 73  Pulse 96  Respirations    General: Alert and oriented, in no acute distress. Ambulates with a cane. HEENT: Head is normocephalic. Extraocular movements are intact. Oropharynx is clear. Neck: Neck is supple, no palpable cervical or supraclavicular lymphadenopathy. Heart: Regular in rate and rhythm with no murmurs, rubs, or gallops. Chest: Clear to auscultation bilaterally, with no rhonchi, wheezes, or rales. Neurologic: Cranial nerves II through XII are grossly intact. No obvious focalities. Speech is fluent. Coordination is intact. Psychiatric: Judgment and insight are intact. Affect is appropriate. Breasts: Right breast is large and pendulous, without palpable mass or nipple discharge. Left breast is somewhat smaller than the right. There is a biopsy site in the UOQ but no palpable masses appreciated, nipple discharge or bleeding.   The patient ambulates with a cane and she is accompanied by her two daughters. She needs assistance getting up on exam table in light of her weight issues.    ECOG = 2  0 - Asymptomatic (Fully active, able to carry on all predisease activities without restriction)  1 - Symptomatic but completely ambulatory (Restricted in physically strenuous activity but ambulatory and able to carry out work of a light or sedentary nature. For example, light housework, office work)  2 - Symptomatic, <50% in bed during the day (Ambulatory and capable of all self care but unable to carry out any work activities. Up and about more than 50% of waking hours)  3 - Symptomatic, >50% in bed, but not bedbound (Capable of only limited self-care, confined to bed or chair 50% or more of waking hours)  4 - Bedbound (Completely disabled. Cannot carry on any self-care. Totally confined to bed or chair)  5 - Death   Eustace Pen MM, Creech RH, Tormey DC, et al. [phone]). "Toxicity and response criteria of the [facility]  Group". [place]amps Oncol. 5 (6): 649-55   LABORATORY DATA:  Lab Results  Component Value Date   WBC 6.8 10/01/2016   HGB 14.7 10/01/2016   HCT 45.6 10/01/2016   MCV 97.0 10/01/2016   PLT 176 10/01/2016   CMP     Component Value Date/Time   NA 140 10/01/2016 0826   K 4.1 10/01/2016 0826   CL 104 03/13/2016 1603   CL 105 04/08/2014 0355   CO2 27 10/01/2016 0826   GLUCOSE 116 10/01/2016 0826   BUN 9.0 10/01/2016 0826   CREATININE 0.8 10/01/2016 0826   CALCIUM 9.3 10/01/2016 0826   PROT 6.6 10/01/2016 0826   ALBUMIN 3.3 (L) 10/01/2016 0826   AST 14 10/01/2016 0826   ALT 12 10/01/2016 0826   ALKPHOS 65 10/01/2016 0826   BILITOT 0.53 10/01/2016 0826   GFRNONAA >60 03/13/2016 1603   GFRNONAA 56 (L) 04/08/2014 0355   GFRAA >60 03/13/2016 1603   GFRAA >60 04/08/2014 0355         RADIOGRAPHY: [facility] images and reports reviewed    IMPRESSION/PLAN: Stage (T2, Nx, Mx) Left Breast UOQ Invasive Ductal Carcinoma, ER+ / PR+ / Her2-, Grade 1. The patient would  be a good candidate for breast conservation with lumpectomy followed by radiation therapy. Prior to this the patient will acquire a biopsy of the calcifications to rule out a second breast cancer. If the calcifications are positive, the patient will be scheduled for an MRI. The patient does have extremely large pendulous breasts. Radiation treatment will be  difficult for her due to skin break down and delayed recovery. We discussed partial breast radiation therapy with a mammosite, but with the area of calcifications extending 3.7 cm this lesion may be too large to consider partial breast radiation treatment. We also talked about consideration of lumpectomy followed by breast reduction surgery to improve the patient's tolerance to radiation therapy. However, the patient does have significant medical issues and may not be an optimal candidate for reduction mammoplasty. The patient ambulates with a cane and she is accompanied by her two  daughters. She needs assistance getting up on exam table in light of her weight issues.  Blair Promise, MD   __________________________________________   This document serves as a record of services personally performed by Gery Pray, MD. It was created on his behalf by Bethann Humble, a trained medical scribe. The creation of this record is based on the scribe's personal observations and the provider's statements to them. This document has been checked and approved by the attending provider.

## 2016-10-01 NOTE — Patient Instructions (Signed)

## 2016-10-01 NOTE — Progress Notes (Unsigned)
Nutrition Assessment  Reason for Assessment:  Pt seen in Breast Clinic  ASSESSMENT:   66 year old female with new diagnosis of left breast cancer.  Past medical history reviewed.   Medications:  reviewed  Labs: reviewed  Anthropometrics:   Height: 64 inches Weight: 322 lb BMI: 55.4   NUTRITION DIAGNOSIS: Food and nutrition related knowledge deficit related to new diagnosis of breast cancer as evidenced by no prior need for nutrition related information.  INTERVENTION:   Discussed and provided packet of information regarding nutritional tips for breast cancer patients.  Questions answered.  Teachback method used.  Contact information provided and patient knows to contact me with questions/concerns.   MONITORING, EVALUATION, and GOAL: Pt will consume a healthy plant based diet to maintain lean body mass throughout treatment.   Jaleeyah Munce B. Zenia Resides, [place], [place] Registered Dietitian [phone] (pager)

## 2016-10-01 NOTE — Progress Notes (Signed)
Clinical Social Work [place] Psychosocial Distress Screening [place]  Patient completed distress screening protocol and scored a 2 on the Psychosocial Distress Thermometer which indicates mild distress. Clinical Social Worker met with patient and patients family in [facility] to assess for distress and other psychosocial needs. Patient stated she was feeling overwhelmed but felt "better" after meeting with the treatment team and getting more information on her treatment plan. CSW and patient discussed common feeling and emotions when being diagnosed with cancer, and the importance of support during treatment. CSW informed patient of the support team and support services at [facility]. CSW provided contact information and encouraged patient to call with any questions or concerns.  ONCBCN DISTRESS SCREENING 10/01/2016  Screening Type Initial Screening  Distress experienced in past week (1-10) 2  Physical Problem type Pain  Physician notified of physical symptoms Yes     Johnnye Lana, MSW, LCSW, OSW-C Clinical Social Worker [place] [phone]

## 2016-10-02 ENCOUNTER — Other Ambulatory Visit: Payer: Self-pay | Admitting: Radiology

## 2016-10-07 ENCOUNTER — Telehealth: Payer: Self-pay | Admitting: *Deleted

## 2016-10-07 NOTE — Telephone Encounter (Signed)
Attempted to call patient. Unable to leave voicemail.  

## 2016-10-08 ENCOUNTER — Telehealth: Payer: Self-pay | Admitting: *Deleted

## 2016-10-08 NOTE — Telephone Encounter (Signed)
Spoke to pt regarding [place] from 10/01/16. Denies questions or concern regarding dx or treatment plan. Discussed concerns financially. FC referral made. Encourage pt to call with needs. Received verbal understanding.

## 2016-10-13 ENCOUNTER — Telehealth: Payer: Self-pay | Admitting: *Deleted

## 2016-10-13 ENCOUNTER — Other Ambulatory Visit: Payer: Self-pay | Admitting: Oncology

## 2016-10-13 ENCOUNTER — Telehealth: Payer: Self-pay | Admitting: Oncology

## 2016-10-13 MED ORDER — ANASTROZOLE 1 MG PO TABS: 1.0000 mg | ORAL_TABLET | Freq: Every day | ORAL | 4 refills | Status: DC

## 2016-10-13 NOTE — Telephone Encounter (Signed)
Spoke to pt regarding anastrozole. Discussed instructions and reasoning for taking. Denies questions at this time.

## 2016-10-13 NOTE — Telephone Encounter (Signed)
Unable to reach patient or leave voicemail due to it not being set up. maile pt appt letter to inform of 5/23 appt at 345 pm per LOS

## 2016-10-13 NOTE — Telephone Encounter (Signed)
Called pt x2 to discuss AI oral therapy prior to sx. Unable to leave VM d/t it is not set up. Will call again

## 2016-10-23 ENCOUNTER — Other Ambulatory Visit: Payer: Self-pay | Admitting: General Surgery

## 2016-10-23 ENCOUNTER — Telehealth: Payer: Self-pay | Admitting: Gastroenterology

## 2016-10-23 DIAGNOSIS — C50412 Malignant neoplasm of upper-outer quadrant of left female breast: Secondary | ICD-10-CM

## 2016-10-23 DIAGNOSIS — Z170 Estrogen receptor positive status [ER+]: Secondary | ICD-10-CM

## 2016-10-23 DIAGNOSIS — Z171 Estrogen receptor negative status [ER-]: Principal | ICD-10-CM

## 2016-10-23 NOTE — Telephone Encounter (Signed)
I'm sorry to hear this. She can reschedule at a time that works better for her pending her course. thanks

## 2016-10-23 NOTE — Telephone Encounter (Signed)
FYI, I will call endo scheduling and cancel.

## 2016-10-24 NOTE — Telephone Encounter (Signed)
Called endo and cancelled her procedure.

## 2016-10-28 ENCOUNTER — Other Ambulatory Visit: Payer: Self-pay | Admitting: General Surgery

## 2016-11-05 ENCOUNTER — Ambulatory Visit (HOSPITAL_BASED_OUTPATIENT_CLINIC_OR_DEPARTMENT_OTHER): Payer: Medicare Other | Admitting: Oncology

## 2016-11-05 ENCOUNTER — Telehealth: Payer: Self-pay | Admitting: Oncology

## 2016-11-05 VITALS — BP 131/69 | HR 98 | Temp 98.2°F | Resp 18 | Ht 64.0 in | Wt 314.1 lb

## 2016-11-05 DIAGNOSIS — C50412 Malignant neoplasm of upper-outer quadrant of left female breast: Secondary | ICD-10-CM | POA: Diagnosis not present

## 2016-11-05 DIAGNOSIS — Z720 Tobacco use: Secondary | ICD-10-CM

## 2016-11-05 DIAGNOSIS — Z79811 Long term (current) use of aromatase inhibitors: Secondary | ICD-10-CM

## 2016-11-05 DIAGNOSIS — Z170 Estrogen receptor positive status [ER+]: Secondary | ICD-10-CM

## 2016-11-05 NOTE — Telephone Encounter (Signed)
Gave patient AVS and calender per 5/23 los. - lab and f/u in August.

## 2016-11-05 NOTE — Progress Notes (Signed)
[place]  Telephone:([phone]) [phone] Fax:(336) [phone]     ID: Daisy [name] DOB: 06-19-1950  MR#: [id number]  DSK#:[id number]  Patient Care Team: Alvester Chou, NP as PCP - General (Nurse Practitioner) Rolm Bookbinder, MD as Consulting Physician (General Surgery) Undra Harriman, Virgie Dad, MD as Consulting Physician (Oncology) Gery Pray, MD as Consulting Physician (Radiation Oncology) Lorelle Gibbs, MD (Radiology) Armbruster, Renelda Loma, MD as Consulting Physician (Gastroenterology) Adrian Prows, MD as Consulting Physician (Cardiology) Earlie Server, MD as Consulting Physician (Orthopedic Surgery) Chauncey Cruel, MD OTHER MD:  CHIEF COMPLAINT: Estrogen receptor positive breast cancer  CURRENT TREATMENT: Awaiting definitive surgery   BREAST CANCER HISTORY: From the original intake note:  Daisy [name] had routine screening mammography at [facility] 09/17/2016. The breast density was category A. At the 12:00 location anteriorly in the left breast there was an area of linear calcifications measuring 0.3 cm. There was also an area of architectural distortion in the left breast upper outer quadrant. On 09/23/2016 the patient underwent left diagnostic mammography and this confirmed an area of calcifications and architectural distortion. Accordingly on 09/24/2016 the patient underwent left breast ultrasonography which did not show any abnormality in the left breast or left axilla.  On 09/24/2016 the patient underwent biopsy of the left breast area of architectural distortion and this showed ([id number]) invasive ductal carcinoma, grade 1, estrogen receptor 100% positive, and progesterone receptor 100% positive, both with strong staining intensity, with an MIB-1 of 5%, and no HER-2 amplification, the signals ratio being 1.51 and the number per cell 2.65.  The patient's subsequent history is as detailed below.  INTERVAL HISTORY: Daisy [name] returns today for follow-up of her  estrogen receptor positive breast cancer accompanied by one of her daughters. Since her last visit here Daisy [name] underwent a second needle core biopsy of an area in her left breast 10/02/2016. This showed only fibrocystic change ([id number].)  She had considered bilateral breast reduction, but Dr. Iran Planas felt given her comorbidities this would be a high-risk procedure and the patient is now planning on simple lumpectomy.   REVIEW OF SYSTEMS: There has many problems although the main problem she tells me is the fact that her other daughter has moved in together with her boyfriend and her dog. There is no prospect of they're moving out any time soon. Daisy [name] is used to living by herself and this is a stretch particularly the dog is a problem for her. Very emotional about this. Aside from that she describes herself is moderately fatigued. She has chronic pain in her knees and feet. Her ankles swell. She can't walk up stairs without stopping. She feels anxious but denies depression. She is just "emotional". A detailed review of systems today was otherwise stable.  PAST MEDICAL HISTORY: Past Medical History:  Diagnosis Date  . Acid reflux   . Arthritis   . Cholecystitis   . Depression   . Hyperlipemia   . Malignant neoplasm of upper-outer quadrant of left breast in female, estrogen receptor positive ([place]) 09/30/2016  . Obesity   . Peripheral vascular disease ([place])   . Pneumonia     PAST SURGICAL HISTORY: Past Surgical History:  Procedure Laterality Date  . ABDOMINAL HYSTERECTOMY    . CESAREAN SECTION    . CHOLECYSTECTOMY    . KNEE ARTHROSCOPY     bilateral  . LOWER EXTREMITY ANGIOGRAM N/A 04/04/2014   Procedure: LOWER EXTREMITY ANGIOGRAM;  Surgeon: Laverda Page, MD;  Location[facility] C[facility] LAB;  Service: Cardiovascular;  Laterality: N/A;  .  ROTATOR CUFF REPAIR    . TONSILLECTOMY    . WRIST SURGERY      FAMILY HISTORY Family History  Problem Relation Age of Onset  . Heart disease  Mother   . Lung cancer Father   . Heart disease Father   . Heart disease Brother   The patient's father died at age 5 from lung cancer in the setting of tobacco abuse. The patient's mother died at the age of 23 from pneumonia. The patient has 3 brothers, 1 sister. There is no history of breast or ovarian cancer in the family.  GYNECOLOGIC HISTORY:  No LMP recorded. Patient has had a hysterectomy. Menarche age 40, first live birth age 79. The patient is GX P2. She underwent simple hysterectomy without salpingo-oophorectomy in 1986. She did not take hormone replacement. She did use oral contraceptives remotely for about 5 years, with no complications.  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 At home she lives with her daughter Daisy [name], who is a paramedic and their dog Daisy [name]. Daughter Daisy [name] lives in [place] and works as a Gaffer. The patient has no grandchildren. She is a Psychologist, forensic.    ADVANCED DIRECTIVES: Not in place. The patient was given the appropriate documents to complete and notarize at her discretion in course of her 10/01/2016 visit   HEALTH MAINTENANCE: Social History  Substance Use Topics  . Smoking status: Current Every Day Smoker    Types: Cigarettes  . Smokeless tobacco: Never Used  . Alcohol use No     Colonoscopy:Never  PAP:  Bone density: Never   Allergies  Allergen Reactions  . Sulfur Hives    Current Outpatient Prescriptions  Medication Sig Dispense Refill  . albuterol (PROAIR HFA) 108 (90 BASE) MCG/ACT inhaler Inhale 2 puffs into the lungs every 6 (six) hours as needed for wheezing or shortness of breath.    . anastrozole (ARIMIDEX) 1 MG tablet Take 1 tablet (1 mg total) by mouth daily. 90 tablet 4  . aspirin 81 MG chewable tablet Chew 81 mg by mouth daily.    . cilostazol (PLETAL) 100 MG tablet Take 100 mg by mouth 2 (two) times daily.    Marland Kitchen dicyclomine (BENTYL) 10 MG capsule Take 1 capsule (10 mg total) by mouth every 8  (eight) hours. 30 capsule 3  . ketoconazole (NIZORAL) 2 % cream Apply 1 application topically daily. 15 g 0  . Multiple Vitamins-Minerals (PRESERVISION AREDS 2 PO) Take by mouth.    Marland Kitchen omeprazole (PRILOSEC) 20 MG capsule Take 20 mg by mouth daily.    . pantoprazole (PROTONIX) 20 MG tablet Take 20 mg by mouth daily.    . rosuvastatin (CRESTOR) 10 MG tablet Take 10 mg by mouth at bedtime.    Sallye Lat (EYE DROPS ADVANCED RELIEF) 0.05-0.1-1-1 % SOLN Place 1 drop into both eyes 2 (two) times daily.      No current facility-administered medications for this visit.     OBJECTIVE: Morbidly obese white woman who appears stated age 66:   11/05/16 1542  BP: 131/69  Pulse: 98  Resp: 18  Temp: 98.2 F (36.8 C)     Body mass index is 53.92 kg/m.    ECOG FS:2 - Symptomatic, <50% confined to bed  Sclerae unicteric, EOMs intact Oropharynx clear and moist No cervical or supraclavicular adenopathy Lungs no rales or rhonchi Heart regular rate and rhythm Abd soft, nontender, positive bowel sounds MSK no focal spinal tenderness, no upper extremity  lymphedema Neuro: nonfocal, well oriented, appropriate affect Breasts: I do not palpate any mass in either breast. The left breast of course is status post biopsy 2. Both axillae are benign.    LAB RESULTS:  CMP     Component Value Date/Time   NA 140 10/01/2016 0826   K 4.1 10/01/2016 0826   CL 104 03/13/2016 1603   CL 105 04/08/2014 0355   CO2 27 10/01/2016 0826   GLUCOSE 116 10/01/2016 0826   BUN 9.0 10/01/2016 0826   CREATININE 0.8 10/01/2016 0826   CALCIUM 9.3 10/01/2016 0826   PROT 6.6 10/01/2016 0826   ALBUMIN 3.3 (L) 10/01/2016 0826   AST 14 10/01/2016 0826   ALT 12 10/01/2016 0826   ALKPHOS 65 10/01/2016 0826   BILITOT 0.53 10/01/2016 0826   GFRNONAA >60 03/13/2016 1603   GFRNONAA 56 (L) 04/08/2014 0355   GFRAA >60 03/13/2016 1603   GFRAA >60 04/08/2014 0355    No results found for: Ronnald Ramp, A1GS, A2GS, BETS, BETA2SER, GAMS, MSPIKE, SPEI  No results found for: Nils Pyle, [facility]  Lab Results  Component Value Date   WBC 6.8 10/01/2016   NEUTROABS 4.1 10/01/2016   HGB 14.7 10/01/2016   HCT 45.6 10/01/2016   MCV 97.0 10/01/2016   PLT 176 10/01/2016      Chemistry      Component Value Date/Time   NA 140 10/01/2016 0826   K 4.1 10/01/2016 0826   CL 104 03/13/2016 1603   CL 105 04/08/2014 0355   CO2 27 10/01/2016 0826   BUN 9.0 10/01/2016 0826   CREATININE 0.8 10/01/2016 0826      Component Value Date/Time   CALCIUM 9.3 10/01/2016 0826   ALKPHOS 65 10/01/2016 0826   AST 14 10/01/2016 0826   ALT 12 10/01/2016 0826   BILITOT 0.53 10/01/2016 0826       No results found for: LABCA2  No components found for: [id number]  No results for input(s): INR in the last 168 hours.  Urinalysis    Component Value Date/Time   COLORURINE YELLOW 03/13/2016 2145   APPEARANCEUR CLEAR 03/13/2016 2145   APPEARANCEUR Cloudy 04/07/2014 2005   LABSPEC 1.017 03/13/2016 2145   LABSPEC 1.006 04/07/2014 2005   PHURINE 6.0 03/13/2016 2145   GLUCOSEU NEGATIVE 03/13/2016 2145   GLUCOSEU Negative 04/07/2014 2005   HGBUR NEGATIVE 03/13/2016 2145   BILIRUBINUR NEGATIVE 03/13/2016 2145   BILIRUBINUR Negative 04/07/2014 2005   KETONESUR NEGATIVE 03/13/2016 2145   PROTEINUR NEGATIVE 03/13/2016 2145   NITRITE NEGATIVE 03/13/2016 2145   LEUKOCYTESUR NEGATIVE 03/13/2016 2145   LEUKOCYTESUR 3+ 04/07/2014 2005     STUDIES: Second biopsy  ELIGIBLE FOR AVAILABLE RESEARCH PROTOCOL: no  ASSESSMENT: 66 y.o. [place], [place] woman Status post left breast upper outer quadrant biopsy 09/24/2016 of an area of architectural distortion measuring 3.7 cm, showing invasive ductal carcinoma, grade 1, estrogen and progesterone receptor positive, HER-2 nonamplified, with an MIB-1 of 5%.  (a) a 0.3 cm area of linear calcifications in the superior aspect of the breast has not  yet been biopsy   (1) breast conserving surgery with sentinel lymph node sampling pending.  (2) Oncotype DX to be obtained from the definitive surgical samples; chemotherapy not anticipated  (3) adjuvant radiation to follow  (4) Anastrozole started April 2018  (5) tobacco abuse: The patient has been strongly advised to discontinue smoking at this point  PLAN: Daisy [name] is ready to proceed to definitive surgery. I have discussed this with Dr. Donne Hazel  and he has her on her list, with the procedure anticipated within the next 2 weeks.  She is tolerating the anastrozole well and the plan will be to continue that a total of 5 years.  Daisy [name] as described above is very emotional because of her home situation. We discussed this at length today. It does not seem to me that she is going to be able to resolve it.  She was prescribed Xanax and she is using this appropriately. I think she might do better with an antidepressant and she has used Prozac and Zoloft in the past with some success. We are going to consider venlafaxine which in case she is later switched to tamoxifen interacts well with that medicati0n  Although otherwise I anticipate she will have a Oncotype and proceed to surgery, accordingly I'm making a return appointment here in August. She knows to call for any problems that may develop before that visit.   Chauncey Cruel, MD   11/05/2016 3:51 PM Medical Oncology and Hematology [facility] [address] [place],  [address] Tel. [phone]    Fax. [phone]

## 2016-11-06 MED ORDER — VENLAFAXINE HCL ER 37.5 MG PO CP24: 37.5000 mg | ORAL_CAPSULE | Freq: Every day | ORAL | 4 refills | Status: DC

## 2016-11-14 ENCOUNTER — Encounter (HOSPITAL_COMMUNITY): Payer: Self-pay

## 2016-11-14 ENCOUNTER — Ambulatory Visit (HOSPITAL_COMMUNITY): Admit: 2016-11-14 | Payer: Medicare Other | Admitting: Gastroenterology

## 2016-11-14 SURGERY — COLONOSCOPY
Anesthesia: Monitor Anesthesia Care

## 2016-11-17 ENCOUNTER — Encounter (HOSPITAL_COMMUNITY)
Admission: RE | Admit: 2016-11-17 | Discharge: 2016-11-17 | Disposition: A | Discharge status: Home / Self Care | Payer: Medicare Other | Source: Ambulatory Visit | Attending: General Surgery | Admitting: General Surgery

## 2016-11-17 ENCOUNTER — Encounter (HOSPITAL_COMMUNITY): Payer: Self-pay

## 2016-11-17 DIAGNOSIS — Z7982 Long term (current) use of aspirin: Secondary | ICD-10-CM | POA: Diagnosis not present

## 2016-11-17 DIAGNOSIS — Z79811 Long term (current) use of aromatase inhibitors: Secondary | ICD-10-CM | POA: Diagnosis not present

## 2016-11-17 DIAGNOSIS — Z7902 Long term (current) use of antithrombotics/antiplatelets: Secondary | ICD-10-CM | POA: Diagnosis not present

## 2016-11-17 DIAGNOSIS — C50912 Malignant neoplasm of unspecified site of left female breast: Secondary | ICD-10-CM | POA: Diagnosis present

## 2016-11-17 DIAGNOSIS — Z170 Estrogen receptor positive status [ER+]: Secondary | ICD-10-CM | POA: Diagnosis not present

## 2016-11-17 DIAGNOSIS — F17200 Nicotine dependence, unspecified, uncomplicated: Secondary | ICD-10-CM | POA: Diagnosis not present

## 2016-11-17 DIAGNOSIS — Z79899 Other long term (current) drug therapy: Secondary | ICD-10-CM | POA: Diagnosis not present

## 2016-11-17 DIAGNOSIS — Z882 Allergy status to sulfonamides status: Secondary | ICD-10-CM | POA: Diagnosis not present

## 2016-11-17 DIAGNOSIS — F329 Major depressive disorder, single episode, unspecified: Secondary | ICD-10-CM | POA: Diagnosis not present

## 2016-11-17 DIAGNOSIS — Z6841 Body Mass Index (BMI) 40.0 and over, adult: Secondary | ICD-10-CM | POA: Diagnosis not present

## 2016-11-17 DIAGNOSIS — K219 Gastro-esophageal reflux disease without esophagitis: Secondary | ICD-10-CM | POA: Diagnosis not present

## 2016-11-17 DIAGNOSIS — C50412 Malignant neoplasm of upper-outer quadrant of left female breast: Secondary | ICD-10-CM | POA: Diagnosis not present

## 2016-11-17 HISTORY — DX: Dyspnea, unspecified: R06.00

## 2016-11-17 HISTORY — DX: Cardiac arrhythmia, unspecified: I49.9

## 2016-11-17 LAB — BASIC METABOLIC PANEL
ANION GAP: 10 (ref 5–15)
BUN: 8 mg/dL (ref 6–20)
CALCIUM: 9.2 mg/dL (ref 8.9–10.3)
CO2: 29 mmol/L (ref 22–32)
Chloride: 97 mmol/L — ABNORMAL LOW (ref 101–111)
Creatinine, Ser: 0.74 mg/dL (ref 0.44–1.00)
GFR calc Af Amer: >60 mL/min (ref >60)
GLUCOSE: 95 mg/dL (ref 65–99)
Potassium: 4.4 mmol/L (ref 3.5–5.1)
SODIUM: 136 mmol/L (ref 135–145)

## 2016-11-17 LAB — CBC
HCT: 46.7 % — ABNORMAL HIGH (ref 36.0–46.0)
Hemoglobin: 15.4 g/dL — ABNORMAL HIGH (ref 12.0–15.0)
MCH: 31.5 pg (ref 26.0–34.0)
MCHC: 33 g/dL (ref 30.0–36.0)
MCV: 95.5 fL (ref 78.0–100.0)
PLATELETS: 187 10*3/uL (ref 150–400)
RBC: 4.89 MIL/uL (ref 3.87–5.11)
RDW: 13.6 % (ref 11.5–15.5)
WBC: 7.2 10*3/uL (ref 4.0–10.5)

## 2016-11-17 NOTE — Pre-Procedure Instructions (Signed)
    [place]  11/17/2016      [facility] - [place], [place] - [address] [place] [address] Phone: [phone] Fax: [phone]    Your procedure is scheduled on 11/19/16.  [facility]  Call this number if you have problems the morning of surgery:  [phone]   Remember:  Do not eat food or drink liquids after midnight.  Take these medicines the morning of surgery with A SIP OF WATER --all inhalers,xanax,protonix,effexor   Do not wear jewelry, make-up or nail polish.  Do not wear lotions, powders, or perfumes, or deoderant.  Do not shave 48 hours prior to surgery.  Men may shave face and neck.  Do not bring valuables to the hospital.  [facility] is not responsible for any belongings or valuables.  Contacts, dentures or bridgework may not be worn into surgery.  Leave your suitcase in the car.  After surgery it may be brought to your room.  For patients admitted to the hospital, discharge time will be determined by your treatment team.  Patients discharged the day of surgery will not be allowed to drive home.   Name and phone number of your driver:    Special instructions:  Do not take any aspirin,anti-inflammatories,vitamins,or herbal supplements 5-7 days prior to surgery.  Please read over the following fact sheets that you were given.

## 2016-11-18 MED ORDER — DEXTROSE 5 % IV SOLN
3.0000 g | INTRAVENOUS | Status: AC
  Administered 2016-11-19: 3 g via INTRAVENOUS
  Filled 2016-11-18: qty 3000

## 2016-11-18 MED ORDER — GABAPENTIN 300 MG PO CAPS
300.0000 mg | ORAL_CAPSULE | ORAL | Status: AC
  Administered 2016-11-19: 300 mg via ORAL
  Filled 2016-11-18: qty 1

## 2016-11-18 MED ORDER — ACETAMINOPHEN 500 MG PO TABS
1000.0000 mg | ORAL_TABLET | ORAL | Status: DC
  Filled 2016-11-18: qty 2

## 2016-11-18 NOTE — Progress Notes (Signed)
Anesthesia Chart Review:  Pt is a 66 year old female scheduled for L breast lumpectomy with radioactive seed and sentinel lymph node biopsy on 11/19/2016 with Rolm Bookbinder, MD  - PCP is Alvester Chou, NP - Cardiologist is Kela Millin, MD  PMH includes:  Irregular heart rate (not specified), PVD (s/p PTA and stenting of L SFA), hyperlipidemia, GERD.  Current smoker. BMI 52  Medications include: albuterol, arimidex, ASA 81mg , pletal, protonix, rosuvastatin, maxzide. Pt stopped pletal 11/12/16.   Preoperative labs reviewed.    EKG requested from Dr. Irven Shelling office. If it does not arrive in time, EKG will be obtained DOS.   Nuclear stress test 09/15/13 (correspondence in media tab 04/06/14):  1. Resting EKG NSR, poor R wave progression. Stress EKG nondiagnostic for ischemia. No ST-T changes of ischemia noted with pharmacologic stress test. Occasional PVC noted. 2. Perfusion study demonstrated a prominent breast attenuation artifact in the inferior wall. Inferior wall scar cannot be completely excluded however there was no demonstrable ischemia. Dynamic gated images reveal normal wall motion and endocardial thickening. LVEF estimated to be 50%. This represents a low risk study.  Echo 09/14/13 (correspondence in media tab 04/06/14): 1. LV cavity normal in size. Mild/moderate concentric hypertrophy. Normal global wall motion. Normal systolic global function. Calculated EF 55%. Doppler evidence of grade I diastolic dysfunction. 2. Mild calcification of the mitral annulus. Trace mitral regurgitation. Mitral valve inflow A >E ratio. 3. Tricuspid valve structurally normal. Trace tricuspid regurgitation.  If EKG acceptable, I anticipate pt can proceed as scheduled.   Willeen Cass, FNP-BC [facility]/Anesthesiology Phone: [phone] 11/18/2016 3:29 PM

## 2016-11-19 ENCOUNTER — Encounter (HOSPITAL_COMMUNITY): Payer: Self-pay | Admitting: *Deleted

## 2016-11-19 ENCOUNTER — Ambulatory Visit (HOSPITAL_COMMUNITY): Payer: Medicare Other | Admitting: Anesthesiology

## 2016-11-19 ENCOUNTER — Encounter (HOSPITAL_COMMUNITY)
Admission: RE | Disposition: A | Discharge status: Home / Self Care | Payer: Self-pay | Source: Ambulatory Visit | Attending: General Surgery

## 2016-11-19 ENCOUNTER — Ambulatory Visit (HOSPITAL_COMMUNITY): Payer: Medicare Other | Admitting: Emergency Medicine

## 2016-11-19 ENCOUNTER — Observation Stay (HOSPITAL_COMMUNITY)
Admission: RE | Admit: 2016-11-19 | Discharge: 2016-11-20 | Disposition: A | Discharge status: Home / Self Care | Payer: Medicare Other | Source: Ambulatory Visit | Attending: General Surgery | Admitting: General Surgery

## 2016-11-19 ENCOUNTER — Encounter (HOSPITAL_COMMUNITY)
Admission: RE | Admit: 2016-11-19 | Discharge: 2016-11-19 | Disposition: A | Discharge status: Home / Self Care | Payer: Medicare Other | Source: Ambulatory Visit | Attending: General Surgery | Admitting: General Surgery

## 2016-11-19 DIAGNOSIS — Z882 Allergy status to sulfonamides status: Secondary | ICD-10-CM | POA: Insufficient documentation

## 2016-11-19 DIAGNOSIS — Z170 Estrogen receptor positive status [ER+]: Secondary | ICD-10-CM | POA: Diagnosis not present

## 2016-11-19 DIAGNOSIS — Z79899 Other long term (current) drug therapy: Secondary | ICD-10-CM | POA: Insufficient documentation

## 2016-11-19 DIAGNOSIS — C50412 Malignant neoplasm of upper-outer quadrant of left female breast: Secondary | ICD-10-CM | POA: Diagnosis not present

## 2016-11-19 DIAGNOSIS — K219 Gastro-esophageal reflux disease without esophagitis: Secondary | ICD-10-CM | POA: Insufficient documentation

## 2016-11-19 DIAGNOSIS — F17200 Nicotine dependence, unspecified, uncomplicated: Secondary | ICD-10-CM | POA: Diagnosis not present

## 2016-11-19 DIAGNOSIS — C50411 Malignant neoplasm of upper-outer quadrant of right female breast: Secondary | ICD-10-CM | POA: Diagnosis present

## 2016-11-19 DIAGNOSIS — Z6841 Body Mass Index (BMI) 40.0 and over, adult: Secondary | ICD-10-CM | POA: Insufficient documentation

## 2016-11-19 DIAGNOSIS — Z7902 Long term (current) use of antithrombotics/antiplatelets: Secondary | ICD-10-CM | POA: Insufficient documentation

## 2016-11-19 DIAGNOSIS — F329 Major depressive disorder, single episode, unspecified: Secondary | ICD-10-CM | POA: Diagnosis not present

## 2016-11-19 DIAGNOSIS — Z79811 Long term (current) use of aromatase inhibitors: Secondary | ICD-10-CM | POA: Insufficient documentation

## 2016-11-19 DIAGNOSIS — Z171 Estrogen receptor negative status [ER-]: Principal | ICD-10-CM

## 2016-11-19 DIAGNOSIS — Z7982 Long term (current) use of aspirin: Secondary | ICD-10-CM | POA: Insufficient documentation

## 2016-11-19 HISTORY — DX: Malignant neoplasm of upper-outer quadrant of right female breast: C50.411

## 2016-11-19 HISTORY — PX: BREAST LUMPECTOMY WITH RADIOACTIVE SEED AND SENTINEL LYMPH NODE BIOPSY: SHX6550

## 2016-11-19 HISTORY — PX: BREAST LUMPECTOMY: SHX2

## 2016-11-19 SURGERY — BREAST LUMPECTOMY WITH RADIOACTIVE SEED AND SENTINEL LYMPH NODE BIOPSY
Anesthesia: General | Site: Breast | Laterality: Left

## 2016-11-19 MED ORDER — 0.9 % SODIUM CHLORIDE (POUR BTL) OPTIME
TOPICAL | Status: DC | PRN
  Administered 2016-11-19: 1000 mL

## 2016-11-19 MED ORDER — TRIAMTERENE-HCTZ 37.5-25 MG PO TABS
1.0000 | ORAL_TABLET | Freq: Every morning | ORAL | Status: DC
  Filled 2016-11-19: qty 1

## 2016-11-19 MED ORDER — SODIUM CHLORIDE 0.9 % IJ SOLN
INTRAMUSCULAR | Status: AC
  Filled 2016-11-19: qty 10

## 2016-11-19 MED ORDER — ONDANSETRON HCL 4 MG/2ML IJ SOLN
INTRAMUSCULAR | Status: AC
  Filled 2016-11-19: qty 2

## 2016-11-19 MED ORDER — LIDOCAINE 2% (20 MG/ML) 5 ML SYRINGE
INTRAMUSCULAR | Status: AC
  Filled 2016-11-19: qty 5

## 2016-11-19 MED ORDER — ENOXAPARIN SODIUM 40 MG/0.4ML ~~LOC~~ SOLN: 40.0000 mg | SUBCUTANEOUS | Status: DC

## 2016-11-19 MED ORDER — OXYCODONE HCL 5 MG PO TABS: 5.0000 mg | ORAL_TABLET | Freq: Four times a day (QID) | ORAL | 0 refills | Status: DC | PRN

## 2016-11-19 MED ORDER — PANTOPRAZOLE SODIUM 20 MG PO TBEC: 20.0000 mg | DELAYED_RELEASE_TABLET | Freq: Every day | ORAL | Status: DC

## 2016-11-19 MED ORDER — FENTANYL CITRATE (PF) 100 MCG/2ML IJ SOLN
INTRAMUSCULAR | Status: DC | PRN
Start: 2016-11-19 — End: 2016-11-19
  Administered 2016-11-19 (×2): 50 ug via INTRAVENOUS

## 2016-11-19 MED ORDER — OXYCODONE HCL 5 MG PO TABS
5.0000 mg | ORAL_TABLET | ORAL | Status: DC | PRN
  Administered 2016-11-20: 10 mg via ORAL
  Filled 2016-11-19: qty 2

## 2016-11-19 MED ORDER — SODIUM CHLORIDE 0.9 % IV SOLN
INTRAVENOUS | Status: DC
  Administered 2016-11-19: 14:00:00 via INTRAVENOUS

## 2016-11-19 MED ORDER — MIDAZOLAM HCL 2 MG/2ML IJ SOLN
INTRAMUSCULAR | Status: AC
  Filled 2016-11-19: qty 2

## 2016-11-19 MED ORDER — METHOCARBAMOL 500 MG PO TABS
500.0000 mg | ORAL_TABLET | Freq: Four times a day (QID) | ORAL | Status: DC | PRN
  Administered 2016-11-20: 500 mg via ORAL
  Filled 2016-11-19: qty 1

## 2016-11-19 MED ORDER — LIDOCAINE HCL (CARDIAC) 20 MG/ML IV SOLN
INTRAVENOUS | Status: DC | PRN
  Administered 2016-11-19: 60 mg via INTRAVENOUS

## 2016-11-19 MED ORDER — BUPIVACAINE-EPINEPHRINE 0.25% -1:200000 IJ SOLN
INTRAMUSCULAR | Status: DC | PRN
  Administered 2016-11-19: 13 mL

## 2016-11-19 MED ORDER — PROPOFOL 10 MG/ML IV BOLUS
INTRAVENOUS | Status: DC | PRN
  Administered 2016-11-19: 150 mg via INTRAVENOUS

## 2016-11-19 MED ORDER — LACTATED RINGERS IV SOLN
INTRAVENOUS | Status: DC
  Administered 2016-11-19 (×2): via INTRAVENOUS

## 2016-11-19 MED ORDER — HYDROMORPHONE HCL 1 MG/ML IJ SOLN: 0.2500 mg | INTRAMUSCULAR | Status: DC | PRN

## 2016-11-19 MED ORDER — BUPIVACAINE-EPINEPHRINE (PF) 0.5% -1:200000 IJ SOLN
INTRAMUSCULAR | Status: DC | PRN
  Administered 2016-11-19: 30 mL

## 2016-11-19 MED ORDER — ONDANSETRON HCL 4 MG/2ML IJ SOLN: 4.0000 mg | Freq: Four times a day (QID) | INTRAMUSCULAR | Status: DC | PRN

## 2016-11-19 MED ORDER — BUPIVACAINE-EPINEPHRINE (PF) 0.25% -1:200000 IJ SOLN
INTRAMUSCULAR | Status: AC
  Filled 2016-11-19: qty 30

## 2016-11-19 MED ORDER — ONDANSETRON 4 MG PO TBDP
4.0000 mg | ORAL_TABLET | Freq: Four times a day (QID) | ORAL | Status: DC | PRN
Start: 2016-11-19 — End: 2016-11-20

## 2016-11-19 MED ORDER — MIDAZOLAM HCL 2 MG/2ML IJ SOLN
2.0000 mg | Freq: Once | INTRAMUSCULAR | Status: AC
  Administered 2016-11-19: 2 mg via INTRAVENOUS

## 2016-11-19 MED ORDER — ALPRAZOLAM 0.25 MG PO TABS: 0.2500 mg | ORAL_TABLET | Freq: Two times a day (BID) | ORAL | Status: DC | PRN

## 2016-11-19 MED ORDER — MORPHINE SULFATE (PF) 4 MG/ML IV SOLN: 2.0000 mg | INTRAVENOUS | Status: DC | PRN

## 2016-11-19 MED ORDER — TECHNETIUM TC 99M SULFUR COLLOID FILTERED
1.0000 | Freq: Once | INTRAVENOUS | Status: AC | PRN
  Administered 2016-11-19: 1 via INTRADERMAL

## 2016-11-19 MED ORDER — SIMETHICONE 80 MG PO CHEW: 40.0000 mg | CHEWABLE_TABLET | Freq: Four times a day (QID) | ORAL | Status: DC | PRN

## 2016-11-19 MED ORDER — METHYLENE BLUE 0.5 % INJ SOLN
INTRAVENOUS | Status: AC
  Filled 2016-11-19: qty 10

## 2016-11-19 MED ORDER — HEMOSTATIC AGENTS (NO CHARGE) OPTIME
TOPICAL | Status: DC | PRN
  Administered 2016-11-19: 1

## 2016-11-19 MED ORDER — NAPROXEN SODIUM 275 MG PO TABS
275.0000 mg | ORAL_TABLET | Freq: Two times a day (BID) | ORAL | Status: DC | PRN
Start: 2016-11-19 — End: 2016-11-20
  Filled 2016-11-19: qty 1

## 2016-11-19 MED ORDER — ALBUTEROL SULFATE (2.5 MG/3ML) 0.083% IN NEBU: 3.0000 mL | INHALATION_SOLUTION | Freq: Four times a day (QID) | RESPIRATORY_TRACT | Status: DC | PRN

## 2016-11-19 MED ORDER — FENTANYL CITRATE (PF) 100 MCG/2ML IJ SOLN
100.0000 ug | Freq: Once | INTRAMUSCULAR | Status: AC
  Administered 2016-11-19: 100 ug via INTRAVENOUS

## 2016-11-19 MED ORDER — ONDANSETRON HCL 4 MG/2ML IJ SOLN
INTRAMUSCULAR | Status: DC | PRN
  Administered 2016-11-19: 4 mg via INTRAVENOUS

## 2016-11-19 MED ORDER — FENTANYL CITRATE (PF) 100 MCG/2ML IJ SOLN
INTRAMUSCULAR | Status: AC
  Filled 2016-11-19: qty 2

## 2016-11-19 MED ORDER — FENTANYL CITRATE (PF) 250 MCG/5ML IJ SOLN
INTRAMUSCULAR | Status: AC
  Filled 2016-11-19: qty 5

## 2016-11-19 MED ORDER — PROPOFOL 10 MG/ML IV BOLUS
INTRAVENOUS | Status: AC
  Filled 2016-11-19: qty 20

## 2016-11-19 SURGICAL SUPPLY — 50 items
APPLIER CLIP 9.375 MED OPEN (MISCELLANEOUS) ×2
BINDER BREAST XXLRG (GAUZE/BANDAGES/DRESSINGS) ×2 IMPLANT
BLADE SURG 15 STRL LF DISP TIS (BLADE) ×1 IMPLANT
BLADE SURG 15 STRL SS (BLADE) ×1
CANISTER SUCT 3000ML PPV (MISCELLANEOUS) ×2 IMPLANT
CHLORAPREP W/TINT 26ML (MISCELLANEOUS) ×2 IMPLANT
CLIP APPLIE 9.375 MED OPEN (MISCELLANEOUS) ×1 IMPLANT
CONT SPEC 4OZ CLIKSEAL STRL BL (MISCELLANEOUS) ×2 IMPLANT
COVER PROBE W GEL 5X96 (DRAPES) ×2 IMPLANT
COVER SURGICAL LIGHT HANDLE (MISCELLANEOUS) ×2 IMPLANT
DERMABOND ADVANCED (GAUZE/BANDAGES/DRESSINGS) ×2
DERMABOND ADVANCED .7 DNX12 (GAUZE/BANDAGES/DRESSINGS) ×2 IMPLANT
DEVICE DUBIN SPECIMEN MAMMOGRA (MISCELLANEOUS) ×2 IMPLANT
DRAPE CHEST BREAST 15X10 FENES (DRAPES) ×2 IMPLANT
DRAPE UTILITY XL STRL (DRAPES) ×2 IMPLANT
ELECT COATED BLADE 2.86 ST (ELECTRODE) ×2 IMPLANT
ELECT REM PT RETURN 9FT ADLT (ELECTROSURGICAL) ×2
ELECTRODE REM PT RTRN 9FT ADLT (ELECTROSURGICAL) ×1 IMPLANT
GLOVE BIO SURGEON STRL SZ7 (GLOVE) ×2 IMPLANT
GLOVE BIOGEL PI IND STRL 6 (GLOVE) ×1 IMPLANT
GLOVE BIOGEL PI IND STRL 6.5 (GLOVE) ×1 IMPLANT
GLOVE BIOGEL PI IND STRL 7.5 (GLOVE) ×1 IMPLANT
GLOVE BIOGEL PI INDICATOR 6 (GLOVE) ×1
GLOVE BIOGEL PI INDICATOR 6.5 (GLOVE) ×1
GLOVE BIOGEL PI INDICATOR 7.5 (GLOVE) ×1
GLOVE SURG SS PI 6.0 STRL IVOR (GLOVE) ×2 IMPLANT
GOWN STRL REUS W/ TWL LRG LVL3 (GOWN DISPOSABLE) ×2 IMPLANT
GOWN STRL REUS W/TWL LRG LVL3 (GOWN DISPOSABLE) ×2
HEMOSTAT ARISTA ABSORB 3G PWDR (MISCELLANEOUS) ×2 IMPLANT
ILLUMINATOR WAVEGUIDE N/F (MISCELLANEOUS) ×2 IMPLANT
KIT BASIN OR (CUSTOM PROCEDURE TRAY) ×2 IMPLANT
KIT MARKER MARGIN INK (KITS) ×2 IMPLANT
MARKER SKIN DUAL TIP RULER LAB (MISCELLANEOUS) ×2 IMPLANT
NEEDLE HYPO 25X1 1.5 SAFETY (NEEDLE) ×2 IMPLANT
NS IRRIG 1000ML POUR BTL (IV SOLUTION) ×2 IMPLANT
PACK SURGICAL SETUP 50X90 (CUSTOM PROCEDURE TRAY) ×2 IMPLANT
PENCIL BUTTON HOLSTER BLD 10FT (ELECTRODE) ×2 IMPLANT
SPONGE LAP 18X18 X RAY DECT (DISPOSABLE) ×2 IMPLANT
STRIP CLOSURE SKIN 1/2X4 (GAUZE/BANDAGES/DRESSINGS) ×4 IMPLANT
SUT MNCRL AB 4-0 PS2 18 (SUTURE) ×4 IMPLANT
SUT VIC AB 2-0 SH 27 (SUTURE) ×2
SUT VIC AB 2-0 SH 27XBRD (SUTURE) ×2 IMPLANT
SUT VIC AB 3-0 SH 27 (SUTURE) ×2
SUT VIC AB 3-0 SH 27X BRD (SUTURE) ×2 IMPLANT
SYR BULB 3OZ (MISCELLANEOUS) ×2 IMPLANT
SYR CONTROL 10ML LL (SYRINGE) ×2 IMPLANT
TOWEL OR 17X24 6PK STRL BLUE (TOWEL DISPOSABLE) ×2 IMPLANT
TOWEL OR 17X26 10 PK STRL BLUE (TOWEL DISPOSABLE) ×2 IMPLANT
TUBE CONNECTING 12X1/4 (SUCTIONS) ×2 IMPLANT
YANKAUER SUCT BULB TIP NO VENT (SUCTIONS) ×2 IMPLANT

## 2016-11-19 NOTE — H&P (Signed)
67 yof referred by Alvester Chou for newly diagnosed left breast cancer. she has no personal history of breast disease except for some cysts that were drained. she has no fh of breast or ovarian cancer. she had no mass or dc. she had screening detected distortion that measures about 3.7 cm and some calcifications that measure 4 mm. she had a 4 mm area of calcs that has been biopsied that was separate from distortion. 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  Past Surgical History  Breast Biopsy  Left. Cesarean Section - Multiple  Gallbladder Surgery - Laparoscopic  Hysterectomy (not due to cancer) - Partial  Knee Surgery  Bilateral. Shoulder Surgery  Right. Tonsillectomy   Diagnostic Studies History Colonoscopy  never Mammogram  within last year Pap Smear  >5 years ago  Medication History  Medications Reconciled  Social History  Alcohol use  Remotely quit alcohol use. Caffeine use  Coffee. No drug use  Tobacco use  Current every day smoker.  Family History Arthritis  Mother. Heart Disease  Brother, Father, Mother. Heart disease in female family member before age 91  Heart disease in female family member before age 35  Hypertension  Mother. Kidney Disease  Family Members In General. Respiratory Condition  Father.  Pregnancy / Birth History Age at menarche  89 years. Age of menopause  <45 Contraceptive History  Oral contraceptives. Gravida  3 Maternal age  44-25 Para  2  Review of Systems General Not Present- Appetite Loss, Chills, Fatigue, Fever, Night Sweats, Weight Gain and Weight Loss. Skin Not Present- Change in Wart/Mole, Dryness, Hives, Jaundice, New Lesions, Non-Healing Wounds, Rash and Ulcer. HEENT Present- Wears glasses/contact lenses. Not Present- Earache, Hearing Loss, Hoarseness, Nose Bleed, Oral Ulcers, Ringing in the Ears, Seasonal  Allergies, Sinus Pain, Sore Throat, Visual Disturbances and Yellow Eyes. Respiratory Present- Snoring. Not Present- Bloody sputum, Chronic Cough, Difficulty Breathing and Wheezing. Breast Present- Breast Mass. Not Present- Breast Pain, Nipple Discharge and Skin Changes. Cardiovascular Not Present- Chest Pain, Difficulty Breathing Lying Down, Leg Cramps, Palpitations, Rapid Heart Rate, Shortness of Breath and Swelling of Extremities. Gastrointestinal Present- Chronic diarrhea. Not Present- Abdominal Pain, Bloating, Bloody Stool, Change in Bowel Habits, Constipation, Difficulty Swallowing, Excessive gas, Gets full quickly at meals, Hemorrhoids, Indigestion, Nausea, Rectal Pain and Vomiting. Female Genitourinary Not Present- Frequency, Nocturia, Painful Urination, Pelvic Pain and Urgency. Musculoskeletal Present- Back Pain, Joint Pain, Joint Stiffness, Muscle Pain, Muscle Weakness and Swelling of Extremities. Neurological Present- Trouble walking. Not Present- Decreased Memory, Fainting, Headaches, Numbness, Seizures, Tingling, Tremor and Weakness. Psychiatric Not Present- Anxiety, Bipolar, Change in Sleep Pattern, Depression, Fearful and Frequent crying. Endocrine Not Present- Cold Intolerance, Excessive Hunger, Hair Changes, Heat Intolerance, Hot flashes and New Diabetes. Hematology Not Present- Blood Thinners, Easy Bruising, Excessive bleeding, Gland problems, HIV and Persistent Infections.   Physical Exam  General Mental Status-Alert. Orientation-Oriented X3. Head and Neck Thyroid -Note: no thyromegaly and no thyroid mass. Eye Sclera/Conjunctiva - Bilateral-No scleral icterus. Chest and Lung Exam Chest and lung exam reveals -quiet, even and easy respiratory effort with no use of accessory muscles and on auscultation, normal breath sounds, no adventitious sounds and normal vocal resonance. Breast Nipples-No Discharge. Breast Lump-No Palpable Breast  Mass. Cardiovascular Cardiovascular examination reveals -normal heart sounds, regular rate and rhythm with no murmurs. Abdomen Note: soft nt/nd no hernia palpated Peripheral Vascular Note: 2+ le edema  palpable pulses throughout Lymphatic Head & Neck General Head & Neck Lymphatics: Bilateral - Description - Normal. Axillary General Axillary Region: Bilateral - Description - Normal. 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 We discussed all of the different options for treatment for breast cancer. We discussed a sentinel lymph node biopsy as she does not appear to having lymph node involvement right now. We discussed the performance of that with injection of radioactive tracer. We discussed that there is a chance of having a positive node with a sentinel lymph node biopsy and we will await the permanent pathology to make any other first further decisions in terms of her treatment. One of these options might be to return to the operating room to perform an axillary lymph node dissection. We discussed up to a 5% risk lifetime of chronic shoulder pain as well as lymphedema associated with a sentinel lymph node biopsy. We discussed the options for treatment of the breast cancer which included lumpectomy versus a mastectomy. We discussed the performance of the lumpectomy with radioactive seed placement. We discussed a 5-10% chance of a positive margin requiring reexcision in the operating room. We also discussed that she will need radiation therapy if she undergoes lumpectomy. The breast cannot undergo more radiation therapy in the same breast after lumpectomy in the future. We discussed the mastectomy (removal of whole breast) and the postoperative care for that as well. Mastectomy can be followed by  reconstruction. This is a more extensive surgery and requires more recovery. The decision for lumpectomy vs mastectomy has no impact on decision for chemotherapy. Most mastectomy patients will not need radiation therapy. We discussed that there is no difference in her survival whether she undergoes lumpectomy with radiation therapy or antiestrogen therapy versus a mastectomy. There is also no real difference between her recurrence in the breast. We discussed the risks of operation including bleeding, infection, possible reoperation.

## 2016-11-19 NOTE — Anesthesia Procedure Notes (Signed)
Procedure Name: LMA Insertion Date/Time: 11/19/2016 10:07 AM Performed by: Scheryl Darter Pre-anesthesia Checklist: Patient identified, Emergency Drugs available, Suction available and Patient being monitored Patient Re-evaluated:Patient Re-evaluated prior to inductionOxygen Delivery Method: Circle System Utilized Preoxygenation: Pre-oxygenation with 100% oxygen Intubation Type: IV induction Ventilation: Mask ventilation without difficulty LMA: LMA inserted LMA Size: 4.0 Number of attempts: 1 Placement Confirmation: positive ETCO2 Tube secured with: Tape Dental Injury: Teeth and Oropharynx as per pre-operative assessment

## 2016-11-19 NOTE — Anesthesia Procedure Notes (Signed)
Anesthesia Regional Block: Pectoralis block   Pre-Anesthetic Checklist: ,, timeout performed, Correct Patient, Correct Site, Correct Laterality, Correct Procedure, Correct Position, site marked, Risks and benefits discussed, pre-op evaluation,  At surgeon's request and post-op pain management  Laterality: Left  Prep: Maximum Sterile Barrier Precautions used, chloraprep       Needles:  Injection technique: Single-shot  Needle Type: Echogenic Stimulator Needle     Needle Length: 9cm  Needle Gauge: 21     Additional Needles:   Procedures: ultrasound guided,,,,,,,,  Narrative:  Start time: 11/19/2016 9:24 AM End time: 11/19/2016 9:34 AM Injection made incrementally with aspirations every 5 mL. Anesthesiologist: Roderic Palau  Additional Notes: 2% Lidocaine skin wheel.

## 2016-11-19 NOTE — Interval H&P Note (Signed)
History and Physical Interval Note:  11/19/2016 9:29 AM  Daisy [name]  has presented today for surgery, with the diagnosis of LEFT BREAST CANCER  The various methods of treatment have been discussed with the patient and family. After consideration of risks, benefits and other options for treatment, the patient has consented to  Procedure(s): BREAST LUMPECTOMY WITH RADIOACTIVE SEED AND SENTINEL LYMPH NODE BIOPSY (Left) as a surgical intervention .  The patient's history has been reviewed, patient examined, no change in status, stable for surgery.  I have reviewed the patient's chart and labs.  Questions were answered to the patient's satisfaction.     Jodey Burbano

## 2016-11-19 NOTE — Anesthesia Preprocedure Evaluation (Addendum)
Anesthesia Evaluation  Patient identified by MRN, date of birth, ID band Patient awake    Reviewed: Allergy & Precautions, H&P , NPO status , Patient's Chart, lab work & pertinent test results  Airway Mallampati: III  TM Distance: >3 FB Neck ROM: Full    Dental no notable dental hx. (+) Edentulous Upper, Edentulous Lower, Dental Advisory Given   Pulmonary Current Smoker,    Pulmonary exam normal breath sounds clear to auscultation       Cardiovascular + Peripheral Vascular Disease   Rhythm:Regular Rate:Normal     Neuro/Psych Depression negative neurological ROS     GI/Hepatic Neg liver ROS, GERD  Medicated and Controlled,  Endo/Other  Morbid obesity  Renal/GU negative Renal ROS  negative genitourinary   Musculoskeletal  (+) Arthritis , Osteoarthritis,    Abdominal   Peds  Hematology negative hematology ROS (+)   Anesthesia Other Findings   Reproductive/Obstetrics negative OB ROS                            Anesthesia Physical Anesthesia Plan  ASA: III  Anesthesia Plan: General   Post-op Pain Management:  Regional for Post-op pain   Induction: Intravenous  PONV Risk Score and Plan: 3 and Ondansetron, Dexamethasone, Propofol and Midazolam  Airway Management Planned: LMA  Additional Equipment:   Intra-op Plan:   Post-operative Plan: Extubation in OR  Informed Consent: I have reviewed the patients History and Physical, chart, labs and discussed the procedure including the risks, benefits and alternatives for the proposed anesthesia with the patient or authorized representative who has indicated his/her understanding and acceptance.   Dental advisory given  Plan Discussed with: CRNA  Anesthesia Plan Comments:         Anesthesia Quick Evaluation

## 2016-11-19 NOTE — Discharge Instructions (Signed)
[facility] °Office Phone Number [phone] °POST OP INSTRUCTIONS ° °Always review your discharge instruction sheet given to you by the facility where your surgery was performed. ° °IF YOU HAVE DISABILITY OR FAMILY LEAVE FORMS, YOU MUST BRING THEM TO THE OFFICE FOR PROCESSING.  DO NOT GIVE THEM TO YOUR DOCTOR. ° °1. A prescription for pain medication may be given to you upon discharge.  Take your pain medication as prescribed, if needed.  If narcotic pain medicine is not needed, then you may take acetaminophen (Tylenol), naprosyn (Alleve) or ibuprofen (Advil) as needed. °2. Take your usually prescribed medications unless otherwise directed °3. If you need a refill on your pain medication, please contact your pharmacy.  They will contact our office to request authorization.  Prescriptions will not be filled after 5pm or on week-ends. °4. You should eat very light the first 24 hours after surgery, such as soup, crackers, pudding, etc.  Resume your normal diet the day after surgery. °5. Most patients will experience some swelling and bruising in the breast.  Ice packs and a good support bra will help.  Wear the breast binder provided or a sports bra for 72 hours day and night.  After that wear a sports bra during the day until you return to the office. Swelling and bruising can take several days to resolve.  °6. It is common to experience some constipation if taking pain medication after surgery.  Increasing fluid intake and taking a stool softener will usually help or prevent this problem from occurring.  A mild laxative (Milk of Magnesia or Miralax) should be taken according to package directions if there are no bowel movements after 48 hours. °7. Unless discharge instructions indicate otherwise, you may remove your bandages 48 hours after surgery and you may shower at that time.  You may have steri-strips (small skin tapes) in place directly over the incision.  These strips should be left on the  skin for 7-10 days and will come off on their own.  If your surgeon used skin glue on the incision, you may shower in 24 hours.  The glue will flake off over the next 2-3 weeks.  Any sutures or staples will be removed at the office during your follow-up visit. °8. ACTIVITIES:  You may resume regular daily activities (gradually increasing) beginning the next day.  Wearing a good support bra or sports bra minimizes pain and swelling.  You may have sexual intercourse when it is comfortable. °a. You may drive when you no longer are taking prescription pain medication, you can comfortably wear a seatbelt, and you can safely maneuver your car and apply brakes. °b. RETURN TO WORK:  ______________________________________________________________________________________ °9. You should see your doctor in the office for a follow-up appointment approximately two weeks after your surgery.  Your doctor’s nurse will typically make your follow-up appointment when she calls you with your pathology report.  Expect your pathology report 3-4 business days after your surgery.  You may call to check if you do not hear from us after three days. °10. OTHER INSTRUCTIONS: _______________________________________________________________________________________________ _____________________________________________________________________________________________________________________________________ °_____________________________________________________________________________________________________________________________________ °_____________________________________________________________________________________________________________________________________ ° °WHEN TO CALL DR Jama Krichbaum: °1. Fever over 101.0 °2. Nausea and/or vomiting. °3. Extreme swelling or bruising. °4. Continued bleeding from incision. °5. Increased pain, redness, or drainage from the incision. ° °The clinic staff is available to answer your questions during regular  business hours.  Please don’t hesitate to call and ask to speak to one of the nurses for clinical concerns.  If you   have a medical emergency, go to the nearest emergency room or call 911.  A surgeon from [facility] is always on call at the hospital. ° °For further questions, please visit [email or web address] ° °

## 2016-11-19 NOTE — Anesthesia Postprocedure Evaluation (Signed)
Anesthesia Post Note  Patient: Daisy [name]  Procedure(s) Performed: Procedure(s) (LRB): BREAST LUMPECTOMY WITH RADIOACTIVE SEED AND SENTINEL LYMPH NODE BIOPSY (Left)     Patient location during evaluation: PACU Anesthesia Type: General and Regional Level of consciousness: awake and alert Pain management: pain level controlled Vital Signs Assessment: post-procedure vital signs reviewed and stable Respiratory status: spontaneous breathing, nonlabored ventilation, respiratory function stable and patient connected to nasal cannula oxygen Cardiovascular status: blood pressure returned to baseline and stable Postop Assessment: no signs of nausea or vomiting Anesthetic complications: no    Last Vitals:  Vitals:   11/19/16 1229 11/19/16 1240  BP: 123/65 135/71  Pulse: 82 79  Resp: (!) 24 16  Temp:  36.6 C    Last Pain:  Vitals:   11/19/16 1240  TempSrc:   PainSc: Asleep                 [name],W. EDMOND

## 2016-11-19 NOTE — Transfer of Care (Signed)
Immediate Anesthesia Transfer of Care Note  Patient: Daisy [name]  Procedure(s) Performed: Procedure(s): BREAST LUMPECTOMY WITH RADIOACTIVE SEED AND SENTINEL LYMPH NODE BIOPSY (Left)  Patient Location: PACU  Anesthesia Type:General  Level of Consciousness: awake, alert , oriented and sedated  Airway & Oxygen Therapy: Patient Spontanous Breathing and Patient connected to nasal cannula oxygen  Post-op Assessment: Report given to RN, Post -op Vital signs reviewed and stable and Patient moving all extremities  Post vital signs: Reviewed and stable  Last Vitals:  Vitals:   11/19/16 0940 11/19/16 1126  BP: (!) 121/56   Pulse: 88   Resp: 20   Temp:  36.5 C    Last Pain:  Vitals:   11/19/16 1126  TempSrc:   PainSc: 0-No pain      Patients Stated Pain Goal: 3 (19/14/78 2956)  Complications: No apparent anesthesia complications

## 2016-11-19 NOTE — Op Note (Signed)
Preoperative diagnosis: Leftbreast cancer, clinical stage II Postoperative diagnosis: same as above Procedure: 1. Leftbreast seed guided lumpectomy 2. Left deep axillary sentinel node biopsy Surgeon Dr Serita Grammes Anes general  EBL: minimal Comps none Specimen:  1. Leftbreast marked with paint containing clip and seed 2. leftaxillary sentinel nodes with highest count 503 Sponge count correct at completion Dispoto recovery stable  Indications: This is a [phone] with clinical stage II leftbreast cancer. Due to comorbidiites it has taken a while to get to or. She has remained on antiestrogen. We have decided to proceed with lumpectomy/sn biopsy.She had seed placed prior to beginning. I had these mm in the OR.   Procedure: After informed consent was obtained the patient was taken to the OR. She was injected with technetium in the standard periareolar fashion.She was given anitibiotics. SCDs were in place. She was prepped and draped in the standard sterile surgical fashion. A timeout was performed. She had undergone a pectoral block.  I then located the seed in the upper outer quadrant.  I infiltrated marcaine and made an incision over the seed. I then dissected to the seed.I then removed the seedwith an attempt to get a clear margin.This waspassed off the table after marking with paint.I did confirm removal of theclipand seed with mammography..  I obtained hemostasis. I placed clips in this cavity. I then closed with 2-0 vicryl, 3-0 vicryl and 4-0 monocryl. Glue and steristrips were applied. I then made an incision below the axillary hairline. I then opened the axillary fascia. I identified the sentinel nodes with the highest count as above. There was no gross nodal disease.There was no background radioactivity. I obtained hemostasis. I closed the axillary fascia and breast tissue with 2-0 vicryl. I then closed the dermis with 3-0 vicryl and the skin with 4-0  monocryl.  Glue and steristrips were placed.A breast binder was placed. She was extubated and transferred to recovery stable

## 2016-11-20 ENCOUNTER — Encounter (HOSPITAL_COMMUNITY): Payer: Self-pay | Admitting: General Surgery

## 2016-11-20 DIAGNOSIS — C50412 Malignant neoplasm of upper-outer quadrant of left female breast: Secondary | ICD-10-CM | POA: Diagnosis not present

## 2016-11-20 MED ORDER — OXYCODONE HCL 5 MG PO TABS: 5.0000 mg | ORAL_TABLET | Freq: Four times a day (QID) | ORAL | 0 refills | Status: DC | PRN

## 2016-11-20 NOTE — Progress Notes (Signed)
Discharge home. Home discharge instruction given, no question verbalized. 

## 2016-11-20 NOTE — Progress Notes (Signed)
1 Day Post-Op   Subjective/Chief Complaint: No complaints doing well   Objective: Vital signs in last 24 hours: Temp:  [97.7 F (36.5 C)-98.7 F (37.1 C)] 98.1 F (36.7 C) (06/07 0511) Pulse Rate:  [77-88] 84 (06/07 0511) Resp:  [16-26] 18 (06/07 0156) BP: (114-162)/(46-74) 114/46 (06/07 0511) SpO2:  [90 %-97 %] 94 % (06/07 0511) Weight:  [142.8 kg (314 lb 13.1 oz)] 142.8 kg (314 lb 13.1 oz) (06/06 1304) Last BM Date: 11/18/16  Intake/Output from previous day: 06/06 0701 - 06/07 0700 In: 3036.7 [P.O.:1500; I.V.:1486.7; IV Piggyback:50] Out: 2025 [Urine:2000; Blood:25] Intake/Output this shift: Total I/O In: 1506.7 [P.O.:720; I.V.:786.7] Out: 1100 [Urine:1100]  Incision/Wound:CDI  no drainage or hematoma  Left breast and axilla  Lab Results:   Recent Labs  11/17/16 1306  WBC 7.2  HGB 15.4*  HCT 46.7*  PLT 187   BMET  Recent Labs  11/17/16 1306  NA 136  K 4.4  CL 97*  CO2 29  GLUCOSE 95  BUN 8  CREATININE 0.74  CALCIUM 9.2   PT/INR No results for input(s): LABPROT, INR in the last 72 hours. ABG No results for input(s): PHART, HCO3 in the last 72 hours.  Invalid input(s): PCO2, PO2  Studies/Results: No results found.  Anti-infectives: Anti-infectives    Start     Dose/Rate Route Frequency Ordered Stop   11/19/16 0930  ceFAZolin (ANCEF) 3 g in dextrose 5 % 50 mL IVPB     3 g 130 mL/hr over 30 Minutes Intravenous To [facility] 11/18/16 1320 11/19/16 1015      Assessment/Plan: s/p Procedure(s): BREAST LUMPECTOMY WITH RADIOACTIVE SEED AND SENTINEL LYMPH NODE BIOPSY (Left) Discharge  LOS: 0 days    Jamareon Shimel A. 11/20/2016

## 2016-11-24 NOTE — Discharge Summary (Signed)
Physician Discharge Summary  Patient ID: Daisy [name] MRN: [id number] DOB/AGE: May 25, 1951 65 y.o.  Admit date: 11/19/2016 Discharge date: 11/24/2016  Admission Diagnoses: Left breast cancer   Discharge Diagnoses:  Left breast cancer  Discharged Condition: good  Hospital Course: 22 yof s/p left breast lump and left axillary sentinel node biopsy. She remained overnight and was discharged the following day doing well.   Consults: None  Significant Diagnostic Studies: none  Treatments: surgery: left breast seed guided lumpectomy, left axillary sn biopsy  Disposition: 01-Home or Self Care  Discharge Instructions    Diet - low sodium heart healthy    Complete by:  As directed    Increase activity slowly    Complete by:  As directed      Allergies as of 11/20/2016      Reactions   Levaquin [levofloxacin] Other (See Comments)   Body aches   Sulfur Hives      Medication List    TAKE these medications   ALPRAZolam 0.25 MG tablet Commonly known as:  XANAX Take 0.25 mg by mouth 2 (two) times daily as needed for anxiety.   anastrozole 1 MG tablet Commonly known as:  ARIMIDEX Take 1 tablet (1 mg total) by mouth daily.   aspirin 81 MG chewable tablet Chew 81 mg by mouth daily.   cilostazol 100 MG tablet Commonly known as:  PLETAL Take 100 mg by mouth 2 (two) times daily.   dicyclomine 10 MG capsule Commonly known as:  BENTYL Take 1 capsule (10 mg total) by mouth every 8 (eight) hours.   EYE DROPS ADVANCED RELIEF 0.05-0.1-1-1 % Soln Generic drug:  Tetrahydroz-Dextran-PEG-Povid Place 1 drop into both eyes 2 (two) times daily.   ketoconazole 2 % cream Commonly known as:  NIZORAL Apply 1 application topically daily.   naproxen sodium 220 MG tablet Commonly known as:  ANAPROX Take 220 mg by mouth 2 (two) times daily as needed (pain).   oxyCODONE 5 MG immediate release tablet Commonly known as:  Oxy IR/ROXICODONE Take 1-2 tablets (5-10 mg total) by mouth every  6 (six) hours as needed for moderate pain, severe pain or breakthrough pain.   pantoprazole 20 MG tablet Commonly known as:  PROTONIX Take 20 mg by mouth daily.   PRESERVISION AREDS 2 PO Take 1 tablet by mouth daily.   PROAIR HFA 108 (90 Base) MCG/ACT inhaler Generic drug:  albuterol Inhale 2 puffs into the lungs every 6 (six) hours as needed for wheezing or shortness of breath.   rosuvastatin 10 MG tablet Commonly known as:  CRESTOR Take 10 mg by mouth at bedtime.   triamterene-hydrochlorothiazide 37.5-25 MG tablet Commonly known as:  MAXZIDE-25 Take 1 tablet by mouth every morning.   venlafaxine XR 37.5 MG 24 hr capsule Commonly known as:  EFFEXOR-XR Take 1 capsule (37.5 mg total) by mouth daily with breakfast.      Follow-up Information    Rolm Bookbinder, MD In 3 weeks.   Specialty:  General Surgery Contact information: [address] [place] [address] [phone]           Signed: Rolm Bookbinder 11/24/2016, 9:53 AM

## 2016-11-25 ENCOUNTER — Other Ambulatory Visit: Payer: Self-pay | Admitting: Oncology

## 2016-11-25 ENCOUNTER — Telehealth: Payer: Self-pay | Admitting: *Deleted

## 2016-11-25 NOTE — Telephone Encounter (Signed)
Ordered oncotype per Dr. Magrinat.  Faxed requisition to pathology and confirmed receipt.  

## 2016-12-09 ENCOUNTER — Encounter: Payer: Self-pay | Admitting: Radiation Oncology

## 2016-12-09 ENCOUNTER — Telehealth: Payer: Self-pay | Admitting: *Deleted

## 2016-12-09 NOTE — Telephone Encounter (Signed)
Received oncotype score of 14. Physician team notified. Called pt with results.

## 2016-12-11 ENCOUNTER — Other Ambulatory Visit: Payer: Self-pay | Admitting: General Surgery

## 2016-12-11 ENCOUNTER — Encounter (HOSPITAL_COMMUNITY): Payer: Self-pay | Admitting: *Deleted

## 2016-12-12 ENCOUNTER — Ambulatory Visit (HOSPITAL_COMMUNITY): Payer: Medicare Other | Admitting: Certified Registered Nurse Anesthetist

## 2016-12-12 ENCOUNTER — Encounter (HOSPITAL_COMMUNITY)
Admission: RE | Disposition: A | Discharge status: Home / Self Care | Payer: Self-pay | Source: Ambulatory Visit | Attending: General Surgery

## 2016-12-12 ENCOUNTER — Encounter (HOSPITAL_COMMUNITY): Payer: Self-pay | Admitting: *Deleted

## 2016-12-12 ENCOUNTER — Ambulatory Visit (HOSPITAL_COMMUNITY)
Admission: RE | Admit: 2016-12-12 | Discharge: 2016-12-12 | Disposition: A | Discharge status: Home / Self Care | Payer: Medicare Other | Source: Ambulatory Visit | Attending: General Surgery | Admitting: General Surgery

## 2016-12-12 DIAGNOSIS — C50412 Malignant neoplasm of upper-outer quadrant of left female breast: Secondary | ICD-10-CM | POA: Insufficient documentation

## 2016-12-12 DIAGNOSIS — F17210 Nicotine dependence, cigarettes, uncomplicated: Secondary | ICD-10-CM | POA: Insufficient documentation

## 2016-12-12 DIAGNOSIS — M1990 Unspecified osteoarthritis, unspecified site: Secondary | ICD-10-CM | POA: Diagnosis not present

## 2016-12-12 DIAGNOSIS — I739 Peripheral vascular disease, unspecified: Secondary | ICD-10-CM | POA: Insufficient documentation

## 2016-12-12 DIAGNOSIS — K219 Gastro-esophageal reflux disease without esophagitis: Secondary | ICD-10-CM | POA: Diagnosis not present

## 2016-12-12 DIAGNOSIS — F329 Major depressive disorder, single episode, unspecified: Secondary | ICD-10-CM | POA: Insufficient documentation

## 2016-12-12 HISTORY — PX: RE-EXCISION OF BREAST LUMPECTOMY: SHX6048

## 2016-12-12 LAB — CBC
HEMATOCRIT: 48.2 % — AB (ref 36.0–46.0)
Hemoglobin: 15.8 g/dL — ABNORMAL HIGH (ref 12.0–15.0)
MCH: 30.7 pg (ref 26.0–34.0)
MCHC: 32.8 g/dL (ref 30.0–36.0)
MCV: 93.8 fL (ref 78.0–100.0)
Platelets: 213 10*3/uL (ref 150–400)
RBC: 5.14 MIL/uL — ABNORMAL HIGH (ref 3.87–5.11)
RDW: 14 % (ref 11.5–15.5)
WBC: 8.1 10*3/uL (ref 4.0–10.5)

## 2016-12-12 SURGERY — EXCISION, LESION, BREAST
Anesthesia: General | Site: Breast | Laterality: Left

## 2016-12-12 MED ORDER — DEXAMETHASONE SODIUM PHOSPHATE 10 MG/ML IJ SOLN
INTRAMUSCULAR | Status: AC
  Filled 2016-12-12: qty 1

## 2016-12-12 MED ORDER — FENTANYL CITRATE (PF) 250 MCG/5ML IJ SOLN
INTRAMUSCULAR | Status: AC
  Filled 2016-12-12: qty 5

## 2016-12-12 MED ORDER — ACETAMINOPHEN 500 MG PO TABS
1000.0000 mg | ORAL_TABLET | ORAL | Status: AC
  Administered 2016-12-12: 1000 mg via ORAL
  Filled 2016-12-12: qty 2

## 2016-12-12 MED ORDER — LIDOCAINE 2% (20 MG/ML) 5 ML SYRINGE
INTRAMUSCULAR | Status: AC
  Filled 2016-12-12: qty 5

## 2016-12-12 MED ORDER — FENTANYL CITRATE (PF) 100 MCG/2ML IJ SOLN
25.0000 ug | INTRAMUSCULAR | Status: DC | PRN
  Administered 2016-12-12 (×2): 50 ug via INTRAVENOUS

## 2016-12-12 MED ORDER — MIDAZOLAM HCL 2 MG/2ML IJ SOLN
INTRAMUSCULAR | Status: DC | PRN
  Administered 2016-12-12: 2 mg via INTRAVENOUS

## 2016-12-12 MED ORDER — DEXAMETHASONE SODIUM PHOSPHATE 10 MG/ML IJ SOLN
INTRAMUSCULAR | Status: DC | PRN
  Administered 2016-12-12: 10 mg via INTRAVENOUS

## 2016-12-12 MED ORDER — 0.9 % SODIUM CHLORIDE (POUR BTL) OPTIME
TOPICAL | Status: DC | PRN
  Administered 2016-12-12: 1000 mL

## 2016-12-12 MED ORDER — OXYCODONE HCL 5 MG PO TABS: 5.0000 mg | ORAL_TABLET | Freq: Four times a day (QID) | ORAL | 0 refills | Status: DC | PRN

## 2016-12-12 MED ORDER — GABAPENTIN 300 MG PO CAPS
300.0000 mg | ORAL_CAPSULE | ORAL | Status: AC
  Administered 2016-12-12: 300 mg via ORAL
  Filled 2016-12-12: qty 1

## 2016-12-12 MED ORDER — ONDANSETRON HCL 4 MG/2ML IJ SOLN: 4.0000 mg | Freq: Once | INTRAMUSCULAR | Status: DC | PRN

## 2016-12-12 MED ORDER — CEFAZOLIN SODIUM-DEXTROSE 2-4 GM/100ML-% IV SOLN
2.0000 g | INTRAVENOUS | Status: AC
  Administered 2016-12-12: 2 g via INTRAVENOUS
  Filled 2016-12-12: qty 100

## 2016-12-12 MED ORDER — BUPIVACAINE-EPINEPHRINE 0.25% -1:200000 IJ SOLN
INTRAMUSCULAR | Status: DC | PRN
  Administered 2016-12-12: 7 mL

## 2016-12-12 MED ORDER — FENTANYL CITRATE (PF) 100 MCG/2ML IJ SOLN
INTRAMUSCULAR | Status: DC | PRN
  Administered 2016-12-12: 25 ug via INTRAVENOUS

## 2016-12-12 MED ORDER — BUPIVACAINE-EPINEPHRINE (PF) 0.25% -1:200000 IJ SOLN
INTRAMUSCULAR | Status: AC
  Filled 2016-12-12: qty 30

## 2016-12-12 MED ORDER — LIDOCAINE 2% (20 MG/ML) 5 ML SYRINGE
INTRAMUSCULAR | Status: DC | PRN
  Administered 2016-12-12: 100 mg via INTRAVENOUS

## 2016-12-12 MED ORDER — PROPOFOL 10 MG/ML IV BOLUS
INTRAVENOUS | Status: AC
  Filled 2016-12-12: qty 20

## 2016-12-12 MED ORDER — MIDAZOLAM HCL 2 MG/2ML IJ SOLN
INTRAMUSCULAR | Status: AC
  Filled 2016-12-12: qty 2

## 2016-12-12 MED ORDER — ONDANSETRON HCL 4 MG/2ML IJ SOLN
INTRAMUSCULAR | Status: AC
  Filled 2016-12-12: qty 2

## 2016-12-12 MED ORDER — ONDANSETRON HCL 4 MG/2ML IJ SOLN
INTRAMUSCULAR | Status: DC | PRN
  Administered 2016-12-12: 4 mg via INTRAVENOUS

## 2016-12-12 MED ORDER — FENTANYL CITRATE (PF) 100 MCG/2ML IJ SOLN
INTRAMUSCULAR | Status: AC
  Administered 2016-12-12: 50 ug via INTRAVENOUS
  Filled 2016-12-12: qty 2

## 2016-12-12 MED ORDER — LACTATED RINGERS IV SOLN
INTRAVENOUS | Status: DC | PRN
  Administered 2016-12-12: 07:00:00 via INTRAVENOUS

## 2016-12-12 MED ORDER — PROPOFOL 10 MG/ML IV BOLUS
INTRAVENOUS | Status: DC | PRN
  Administered 2016-12-12: 150 mg via INTRAVENOUS

## 2016-12-12 SURGICAL SUPPLY — 45 items
APPLIER CLIP 9.375 MED OPEN (MISCELLANEOUS)
BINDER BREAST LRG (GAUZE/BANDAGES/DRESSINGS) IMPLANT
BINDER BREAST XLRG (GAUZE/BANDAGES/DRESSINGS) IMPLANT
BINDER BREAST XXLRG (GAUZE/BANDAGES/DRESSINGS) ×3 IMPLANT
CANISTER SUCT 3000ML PPV (MISCELLANEOUS) ×3 IMPLANT
CHLORAPREP W/TINT 26ML (MISCELLANEOUS) ×3 IMPLANT
CLIP APPLIE 9.375 MED OPEN (MISCELLANEOUS) IMPLANT
CLOSURE WOUND 1/2 X4 (GAUZE/BANDAGES/DRESSINGS) ×1
COVER SURGICAL LIGHT HANDLE (MISCELLANEOUS) ×3 IMPLANT
DECANTER SPIKE VIAL GLASS SM (MISCELLANEOUS) ×3 IMPLANT
DERMABOND ADVANCED (GAUZE/BANDAGES/DRESSINGS) ×2
DERMABOND ADVANCED .7 DNX12 (GAUZE/BANDAGES/DRESSINGS) ×1 IMPLANT
DRAPE CHEST BREAST 15X10 FENES (DRAPES) ×3 IMPLANT
ELECT CAUTERY BLADE 6.4 (BLADE) ×3 IMPLANT
ELECT REM PT RETURN 9FT ADLT (ELECTROSURGICAL) ×3
ELECTRODE REM PT RTRN 9FT ADLT (ELECTROSURGICAL) ×1 IMPLANT
GLOVE BIO SURGEON STRL SZ7 (GLOVE) ×3 IMPLANT
GLOVE BIOGEL PI IND STRL 7.5 (GLOVE) ×1 IMPLANT
GLOVE BIOGEL PI INDICATOR 7.5 (GLOVE) ×2
GOWN STRL REUS W/ TWL LRG LVL3 (GOWN DISPOSABLE) ×2 IMPLANT
GOWN STRL REUS W/TWL LRG LVL3 (GOWN DISPOSABLE) ×4
KIT BASIN OR (CUSTOM PROCEDURE TRAY) ×3 IMPLANT
KIT ROOM TURNOVER OR (KITS) ×3 IMPLANT
NEEDLE HYPO 25GX1X1/2 BEV (NEEDLE) ×3 IMPLANT
NS IRRIG 1000ML POUR BTL (IV SOLUTION) ×3 IMPLANT
PACK SURGICAL SETUP 50X90 (CUSTOM PROCEDURE TRAY) ×3 IMPLANT
PAD ARMBOARD 7.5X6 YLW CONV (MISCELLANEOUS) ×3 IMPLANT
PENCIL BUTTON HOLSTER BLD 10FT (ELECTRODE) ×3 IMPLANT
SPONGE LAP 18X18 X RAY DECT (DISPOSABLE) ×3 IMPLANT
STAPLER VISISTAT 35W (STAPLE) ×3 IMPLANT
STRIP CLOSURE SKIN 1/2X4 (GAUZE/BANDAGES/DRESSINGS) ×2 IMPLANT
SUT ETHILON 3 0 FSL (SUTURE) ×3 IMPLANT
SUT MNCRL AB 4-0 PS2 18 (SUTURE) ×3 IMPLANT
SUT SILK 2 0 SH (SUTURE) IMPLANT
SUT VIC AB 2-0 SH 27 (SUTURE) ×6
SUT VIC AB 2-0 SH 27XBRD (SUTURE) ×3 IMPLANT
SUT VIC AB 3-0 SH 27 (SUTURE) ×2
SUT VIC AB 3-0 SH 27X BRD (SUTURE) ×1 IMPLANT
SYR BULB 3OZ (MISCELLANEOUS) ×3 IMPLANT
SYR CONTROL 10ML LL (SYRINGE) ×3 IMPLANT
TOWEL OR 17X24 6PK STRL BLUE (TOWEL DISPOSABLE) ×3 IMPLANT
TOWEL OR 17X26 10 PK STRL BLUE (TOWEL DISPOSABLE) ×3 IMPLANT
TUBE CONNECTING 12'X1/4 (SUCTIONS) ×1
TUBE CONNECTING 12X1/4 (SUCTIONS) ×2 IMPLANT
YANKAUER SUCT BULB TIP NO VENT (SUCTIONS) ×3 IMPLANT

## 2016-12-12 NOTE — Anesthesia Postprocedure Evaluation (Signed)
Anesthesia Post Note  Patient: Daisy [name]  Procedure(s) Performed: Procedure(s) (LRB): RE-EXCISION OF LEFT BREAST LUMPECTOMY (Left)     Patient location during evaluation: PACU Anesthesia Type: General Level of consciousness: awake and alert Pain management: pain level controlled Vital Signs Assessment: post-procedure vital signs reviewed and stable Respiratory status: spontaneous breathing, nonlabored ventilation and respiratory function stable Cardiovascular status: blood pressure returned to baseline and stable Postop Assessment: no signs of nausea or vomiting Anesthetic complications: no    Last Vitals:  Vitals:   12/12/16 0855 12/12/16 0910  BP: 138/67 132/63  Pulse: 77 74  Resp: 14 16  Temp:  36.4 C    Last Pain:  Vitals:   12/12/16 0910  TempSrc:   PainSc: [place]

## 2016-12-12 NOTE — Progress Notes (Signed)
Patient ID: Daisy [name], female   DOB: [birth year], 66 y.o.   MRN: [id number] [place] reviewed day of surgery

## 2016-12-12 NOTE — Anesthesia Preprocedure Evaluation (Addendum)
Anesthesia Evaluation  Patient identified by MRN, date of birth, ID band Patient awake    Reviewed: Allergy & Precautions, NPO status , Patient's Chart, lab work & pertinent test results  Airway Mallampati: III  TM Distance: <3 FB Neck ROM: Full    Dental  (+) Dental Advisory Given, Edentulous Upper, Edentulous Lower   Pulmonary Current Smoker,    Pulmonary exam normal breath sounds clear to auscultation       Cardiovascular + Peripheral Vascular Disease  Normal cardiovascular exam Rhythm:Regular Rate:Normal     Neuro/Psych PSYCHIATRIC DISORDERS Depression negative neurological ROS     GI/Hepatic Neg liver ROS, GERD  Medicated and Controlled,  Endo/Other  negative endocrine ROS  Renal/GU negative Renal ROS     Musculoskeletal  (+) Arthritis , Osteoarthritis,    Abdominal   Peds  Hematology negative hematology ROS (+)   Anesthesia Other Findings Day of surgery medications reviewed with the patient.  LEFT BREAST CANCER  Reproductive/Obstetrics                           Anesthesia Physical Anesthesia Plan  ASA: II  Anesthesia Plan: General   Post-op Pain Management:    Induction: Intravenous  PONV Risk Score and Plan: 3 and Ondansetron, Dexamethasone and Midazolam  Airway Management Planned: LMA  Additional Equipment:   Intra-op Plan:   Post-operative Plan: Extubation in OR  Informed Consent: I have reviewed the patients History and Physical, chart, labs and discussed the procedure including the risks, benefits and alternatives for the proposed anesthesia with the patient or authorized representative who has indicated his/her understanding and acceptance.   Dental advisory given  Plan Discussed with: CRNA  Anesthesia Plan Comments: (Risks/benefits of general anesthesia discussed with patient including risk of damage to teeth, lips, gum, and tongue, nausea/vomiting, allergic  reactions to medications, and the possibility of heart attack, stroke and death.  All patient questions answered.  Patient wishes to proceed.)        Anesthesia Quick Evaluation

## 2016-12-12 NOTE — H&P (View-Only) (Signed)
66 yof referred by Alvester Chou for newly diagnosed left breast cancer. she has no personal history of breast disease except for some cysts that were drained. she has no fh of breast or ovarian cancer. she had no mass or dc. she had screening detected distortion that measures about 3.7 cm and some calcifications that measure 4 mm. she had a 4 mm area of calcs that has been biopsied that was separate from distortion. 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  Past Surgical History  Breast Biopsy  Left. Cesarean Section - Multiple  Gallbladder Surgery - Laparoscopic  Hysterectomy (not due to cancer) - Partial  Knee Surgery  Bilateral. Shoulder Surgery  Right. Tonsillectomy   Diagnostic Studies History Colonoscopy  never Mammogram  within last year Pap Smear  >5 years ago  Medication History  Medications Reconciled  Social History  Alcohol use  Remotely quit alcohol use. Caffeine use  Coffee. No drug use  Tobacco use  Current every day smoker.  Family History Arthritis  Mother. Heart Disease  Brother, Father, Mother. Heart disease in female family member before age 17  Heart disease in female family member before age 75  Hypertension  Mother. Kidney Disease  Family Members In General. Respiratory Condition  Father.  Pregnancy / Birth History Age at menarche  66 years. Age of menopause  <45 Contraceptive History  Oral contraceptives. Gravida  3 Maternal age  20-25 Para  2  Review of Systems General Not Present- Appetite Loss, Chills, Fatigue, Fever, Night Sweats, Weight Gain and Weight Loss. Skin Not Present- Change in Wart/Mole, Dryness, Hives, Jaundice, New Lesions, Non-Healing Wounds, Rash and Ulcer. HEENT Present- Wears glasses/contact lenses. Not Present- Earache, Hearing Loss, Hoarseness, Nose Bleed, Oral Ulcers, Ringing in the Ears, Seasonal  Allergies, Sinus Pain, Sore Throat, Visual Disturbances and Yellow Eyes. Respiratory Present- Snoring. Not Present- Bloody sputum, Chronic Cough, Difficulty Breathing and Wheezing. Breast Present- Breast Mass. Not Present- Breast Pain, Nipple Discharge and Skin Changes. Cardiovascular Not Present- Chest Pain, Difficulty Breathing Lying Down, Leg Cramps, Palpitations, Rapid Heart Rate, Shortness of Breath and Swelling of Extremities. Gastrointestinal Present- Chronic diarrhea. Not Present- Abdominal Pain, Bloating, Bloody Stool, Change in Bowel Habits, Constipation, Difficulty Swallowing, Excessive gas, Gets full quickly at meals, Hemorrhoids, Indigestion, Nausea, Rectal Pain and Vomiting. Female Genitourinary Not Present- Frequency, Nocturia, Painful Urination, Pelvic Pain and Urgency. Musculoskeletal Present- Back Pain, Joint Pain, Joint Stiffness, Muscle Pain, Muscle Weakness and Swelling of Extremities. Neurological Present- Trouble walking. Not Present- Decreased Memory, Fainting, Headaches, Numbness, Seizures, Tingling, Tremor and Weakness. Psychiatric Not Present- Anxiety, Bipolar, Change in Sleep Pattern, Depression, Fearful and Frequent crying. Endocrine Not Present- Cold Intolerance, Excessive Hunger, Hair Changes, Heat Intolerance, Hot flashes and New Diabetes. Hematology Not Present- Blood Thinners, Easy Bruising, Excessive bleeding, Gland problems, HIV and Persistent Infections.   Physical Exam  General Mental Status-Alert. Orientation-Oriented X3. Head and Neck Thyroid -Note: no thyromegaly and no thyroid mass. Eye Sclera/Conjunctiva - Bilateral-No scleral icterus. Chest and Lung Exam Chest and lung exam reveals -quiet, even and easy respiratory effort with no use of accessory muscles and on auscultation, normal breath sounds, no adventitious sounds and normal vocal resonance. Breast Nipples-No Discharge. Breast Lump-No Palpable Breast  Mass. Cardiovascular Cardiovascular examination reveals -normal heart sounds, regular rate and rhythm with no murmurs. Abdomen Note: soft nt/nd no hernia palpated Peripheral Vascular Note: 2+ le edema  palpable pulses throughout Lymphatic Head & Neck General Head & Neck Lymphatics: Bilateral - Description - Normal. Axillary General Axillary Region: Bilateral - Description - Normal. Note: no [place] adenopathy  Assessment & Plan  BREAST CANCER OF UPPER-OUTER QUADRANT OF LEFT FEMALE BREAST (C50.412) Left breast seed guided lumpectomy and left axillary sn biopsy We discussed the staging and pathophysiology of breast cancer. We discussed all of the different options for treatment for breast cancer. We discussed a sentinel lymph node biopsy as she does not appear to having lymph node involvement right now. We discussed the performance of that with injection of radioactive tracer. We discussed that there is a chance of having a positive node with a sentinel lymph node biopsy and we will await the permanent pathology to make any other first further decisions in terms of her treatment. One of these options might be to return to the operating room to perform an axillary lymph node dissection. We discussed up to a 5% risk lifetime of chronic shoulder pain as well as lymphedema associated with a sentinel lymph node biopsy. We discussed the options for treatment of the breast cancer which included lumpectomy versus a mastectomy. We discussed the performance of the lumpectomy with radioactive seed placement. We discussed a 5-10% chance of a positive margin requiring reexcision in the operating room. We also discussed that she will need radiation therapy if she undergoes lumpectomy. The breast cannot undergo more radiation therapy in the same breast after lumpectomy in the future. We discussed the mastectomy (removal of whole breast) and the postoperative care for that as well. Mastectomy can be followed by  reconstruction. This is a more extensive surgery and requires more recovery. The decision for lumpectomy vs mastectomy has no impact on decision for chemotherapy. Most mastectomy patients will not need radiation therapy. We discussed that there is no difference in her survival whether she undergoes lumpectomy with radiation therapy or antiestrogen therapy versus a mastectomy. There is also no real difference between her recurrence in the breast. We discussed the risks of operation including bleeding, infection, possible reoperation.

## 2016-12-12 NOTE — Anesthesia Procedure Notes (Signed)
Procedure Name: LMA Insertion Date/Time: 12/12/2016 7:37 AM Performed by: Garrison Columbus T Pre-anesthesia Checklist: Patient identified, Emergency Drugs available, Suction available and Patient being monitored Patient Re-evaluated:Patient Re-evaluated prior to inductionOxygen Delivery Method: Circle System Utilized Preoxygenation: Pre-oxygenation with 100% oxygen Intubation Type: IV induction LMA: LMA inserted LMA Size: 4.0 Number of attempts: 1 Airway Equipment and Method: Bite block Placement Confirmation: positive ETCO2 Tube secured with: Tape Dental Injury: Teeth and Oropharynx as per pre-operative assessment

## 2016-12-12 NOTE — Transfer of Care (Signed)
Immediate Anesthesia Transfer of Care Note  Patient: Daisy [name]  Procedure(s) Performed: Procedure(s): RE-EXCISION OF LEFT BREAST LUMPECTOMY (Left)  Patient Location: PACU  Anesthesia Type:General  Level of Consciousness: awake, alert  and oriented  Airway & Oxygen Therapy: Patient Spontanous Breathing  Post-op Assessment: Report given to RN, Post -op Vital signs reviewed and stable and Patient moving all extremities X 4  Post vital signs: Reviewed and stable  Last Vitals:  Vitals:   12/12/16 0605  BP: (!) 140/51  Pulse: 77  Resp: 20  Temp: 37.2 C    Last Pain:  Vitals:   12/12/16 0605  TempSrc: Oral      Patients Stated Pain Goal: 8 (16/10/96 0454)  Complications: No apparent anesthesia complications

## 2016-12-12 NOTE — Op Note (Signed)
Preoperative diagnosis: Leftbreast cancer, clinical stage II s/p lumpectomy/sn with positive medial margin Postoperative diagnosis: same as above Procedure:re-excision left breast lumpectomy Surgeon Dr Serita Grammes Anes general  EBL: minimal Comps none Specimen: left breast medial margin marked short superior long lateral double deep Sponge count correct at completion Dispoto recovery stable  Indications: This is a [phone] with clinical stage II leftbreast cancer. She underwent left lumpectomy/sn with pathologic stage I cancer with positive medial margin. Discussed returning for margin excision.    Procedure: After informed consent was obtained the patient was taken to the OR. She was given anitibiotics. SCDs were in place. She was prepped and draped in the standard sterile surgical fashion. A timeout was performed.   I elected to excise old incision as it had separated a little. I then entered the cavity and opened the sutures that were closing breast tissue. I located the medial margin and excised a generous portion. This was marked as above.I obtained hemostasis.the breast tissue was closed with 2-0 vicryl. I then closed with 2-0 vicryl, 3-0 vicryl and 4-0 monocryl. Glue and steristrips were applied.

## 2016-12-12 NOTE — Discharge Instructions (Signed)
[facility] °Office Phone Number [phone] °POST OP INSTRUCTIONS ° °Always review your discharge instruction sheet given to you by the facility where your surgery was performed. ° °IF YOU HAVE DISABILITY OR FAMILY LEAVE FORMS, YOU MUST BRING THEM TO THE OFFICE FOR PROCESSING.  DO NOT GIVE THEM TO YOUR DOCTOR. ° °1. A prescription for pain medication may be given to you upon discharge.  Take your pain medication as prescribed, if needed.  If narcotic pain medicine is not needed, then you may take acetaminophen (Tylenol), naprosyn (Alleve) or ibuprofen (Advil) as needed. °2. Take your usually prescribed medications unless otherwise directed °3. If you need a refill on your pain medication, please contact your pharmacy.  They will contact our office to request authorization.  Prescriptions will not be filled after 5pm or on week-ends. °4. You should eat very light the first 24 hours after surgery, such as soup, crackers, pudding, etc.  Resume your normal diet the day after surgery. °5. Most patients will experience some swelling and bruising in the breast.  Ice packs and a good support bra will help.  Wear the breast binder provided or a sports bra for 72 hours day and night.  After that wear a sports bra during the day until you return to the office. Swelling and bruising can take several days to resolve.  °6. It is common to experience some constipation if taking pain medication after surgery.  Increasing fluid intake and taking a stool softener will usually help or prevent this problem from occurring.  A mild laxative (Milk of Magnesia or Miralax) should be taken according to package directions if there are no bowel movements after 48 hours. °7. Unless discharge instructions indicate otherwise, you may remove your bandages 48 hours after surgery and you may shower at that time.  You may have steri-strips (small skin tapes) in place directly over the incision.  These strips should be left on the  skin for 7-10 days and will come off on their own.  If your surgeon used skin glue on the incision, you may shower in 24 hours.  The glue will flake off over the next 2-3 weeks.  Any sutures or staples will be removed at the office during your follow-up visit. °8. ACTIVITIES:  You may resume regular daily activities (gradually increasing) beginning the next day.  Wearing a good support bra or sports bra minimizes pain and swelling.  You may have sexual intercourse when it is comfortable. °a. You may drive when you no longer are taking prescription pain medication, you can comfortably wear a seatbelt, and you can safely maneuver your car and apply brakes. °b. RETURN TO WORK:  ______________________________________________________________________________________ °9. You should see your doctor in the office for a follow-up appointment approximately two weeks after your surgery.  Your doctor’s nurse will typically make your follow-up appointment when she calls you with your pathology report.  Expect your pathology report 3-4 business days after your surgery.  You may call to check if you do not hear from us after three days. °10. OTHER INSTRUCTIONS: _______________________________________________________________________________________________ _____________________________________________________________________________________________________________________________________ °_____________________________________________________________________________________________________________________________________ °_____________________________________________________________________________________________________________________________________ ° °WHEN TO CALL DR Cadyn Fann: °1. Fever over 101.0 °2. Nausea and/or vomiting. °3. Extreme swelling or bruising. °4. Continued bleeding from incision. °5. Increased pain, redness, or drainage from the incision. ° °The clinic staff is available to answer your questions during regular  business hours.  Please don’t hesitate to call and ask to speak to one of the nurses for clinical concerns.  If you   have a medical emergency, go to the nearest emergency room or call 911.  A surgeon from [facility] is always on call at the hospital. ° °For further questions, please visit [email or web address] ° °

## 2016-12-12 NOTE — Interval H&P Note (Signed)
History and Physical Interval Note:  12/12/2016 6:56 AM  Daisy [name]  has presented today for surgery, with the diagnosis of LEFT BREAST CANCER  The various methods of treatment have been discussed with the patient and family. After consideration of risks, benefits and other options for treatment, the patient has consented to  Procedure(s): RE-EXCISION OF BREAST LUMPECTOMY (Left) as a surgical intervention .  The patient's history has been reviewed, patient examined, no change in status, stable for surgery.  I have reviewed the patient's chart and labs.  Questions were answered to the patient's satisfaction.     Filimon Miranda

## 2016-12-13 ENCOUNTER — Encounter (HOSPITAL_COMMUNITY): Payer: Self-pay | Admitting: General Surgery

## 2016-12-18 ENCOUNTER — Telehealth: Payer: Self-pay | Admitting: Oncology

## 2016-12-18 NOTE — Telephone Encounter (Signed)
Faxed records to [facility] [phone]

## 2016-12-23 NOTE — Progress Notes (Signed)
Location of Breast Cancer: Upper outer quadrant of left Breast  Histology per Pathology Report:  12/12/16 Diagnosis 1. Breast, excision, Left additional Medial Margin - BENIGN BREAST TISSUE WITH PREVIOUS BIOPSY SITE CHANGES. - FIBROCYSTIC CHANGES. - NO RESIDUAL CARCINOMA. - FINAL MEDIAL MARGIN CLEAR. 2. Scar -, Left - SKIN WITH FOCAL INFLAMMATION, GIANT CELL REACTION AND NECROSIS CONSISTENT WITH PREVIOUS BIOPSY. - NO EVIDENCE OF MALIGNANCY.  11/22/2016    Receptor Status: ER(+), PR (+), Her2-neu (-), Ki-(5%)  Did patient present with symptoms (if so, please note symptoms) or was this found on screening mammography?: screening mammography at [facility] 09/17/2016  Past/Anticipated interventions by surgeon, if any: 11/19/2016  Procedure: BREAST LUMPECTOMY WITH RADIOACTIVE SEED AND SENTINEL LYMPH NODE BIOPSY;  Surgeon: Rolm Bookbinder, MD;  Location: [facility];  Service: General;  Laterality: Left;  Past/Anticipated interventions by medical oncology, if any: Currently taking anastrozole.  Lymphedema issues, if any: no  Pain issues, if any:  no   SAFETY ISSUES:  Prior radiation? no  Pacemaker/ICD? no  Possible current pregnancy?no  Is the patient on methotrexate? no  Current Complaints / other details: Some Home distress due to biological child moving back in with dog.  Patient is here with her friend.  She reports having serosanguinous drainage from the lumpectomy incision.  She said it has been draining since Sunday.  She saw Dr. Donne Hazel yesterday.  BP 131/70 (BP Location: Left Arm, Patient Position: Sitting)   Pulse 93   Temp 99.2 F (37.3 C) (Oral)   Ht '5\' 6"'  (1.676 m)   Wt (!) 317 lb 9.6 oz (144.1 kg)   SpO2 95%   BMI 51.26 kg/m   Wt Readings from Last 3 Encounters:  12/25/16 (!) 317 lb 9.6 oz (144.1 kg)  11/19/16 (!) 314 lb 13.1 oz (142.8 kg)  11/17/16 (!) 314 lb 11.2 oz (142.7 kg)

## 2016-12-25 ENCOUNTER — Ambulatory Visit
Admission: RE | Admit: 2016-12-25 | Discharge: 2016-12-25 | Disposition: A | Discharge status: Home / Self Care | Payer: Medicare Other | Source: Ambulatory Visit | Attending: Radiation Oncology | Admitting: Radiation Oncology

## 2016-12-25 ENCOUNTER — Encounter: Payer: Self-pay | Admitting: Radiation Oncology

## 2016-12-25 VITALS — BP 131/70 | HR 93 | Temp 99.2°F | Ht 66.0 in | Wt 317.6 lb

## 2016-12-25 DIAGNOSIS — Z170 Estrogen receptor positive status [ER+]: Secondary | ICD-10-CM | POA: Diagnosis present

## 2016-12-25 DIAGNOSIS — Z79891 Long term (current) use of opiate analgesic: Secondary | ICD-10-CM | POA: Diagnosis not present

## 2016-12-25 DIAGNOSIS — Z7982 Long term (current) use of aspirin: Secondary | ICD-10-CM | POA: Diagnosis not present

## 2016-12-25 DIAGNOSIS — C50412 Malignant neoplasm of upper-outer quadrant of left female breast: Secondary | ICD-10-CM

## 2016-12-25 DIAGNOSIS — Z9889 Other specified postprocedural states: Secondary | ICD-10-CM | POA: Diagnosis not present

## 2016-12-25 DIAGNOSIS — Z888 Allergy status to other drugs, medicaments and biological substances status: Secondary | ICD-10-CM | POA: Diagnosis not present

## 2016-12-25 DIAGNOSIS — Z79899 Other long term (current) drug therapy: Secondary | ICD-10-CM | POA: Diagnosis not present

## 2016-12-25 DIAGNOSIS — C50411 Malignant neoplasm of upper-outer quadrant of right female breast: Secondary | ICD-10-CM

## 2016-12-25 NOTE — Progress Notes (Signed)
Please see the Nurse Progress Note in the MD Initial Consult Encounter for this patient. 

## 2016-12-25 NOTE — Progress Notes (Signed)
Radiation Oncology         [phone]) [phone] ________________________________  Name: Daisy [name] MRN: [id number]  Date: 12/25/2016  DOB: 22-Jan-1951  Re-evaluation Visit Note  CC: Alvester Chou, NP  Magrinat, Virgie Dad, MD    ICD-10-CM   1. 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  She underwent a left breast lumpectomy  and sentinel lymph node biopsy performed by Dr. Donne Hazel on 11/19/16. Pathology showed Grade II invasive ductal carcinoma (pT1c, pN0), ER positive, PR positive, and Her2-neu negative with positive medial margin. Re-excision was performed on 12/12/16 with clear margins. Three sentinel nodes were biopsied and all were negative for malignancy.  On review of systems, patient endorses pain at breast and a "little opening."  She reports having serosanguinous drainage from the lumpectomy incision.  She said it has been draining since Sunday.  She saw Dr. Donne Hazel yesterday for fluid drainage. This did not require any drainage in the office. She endorses minimal discomfort in the breast. She denies swelling in left arm or hand, breathing problems, or SOB.                 ALLERGIES:  is allergic to levaquin [levofloxacin] and sulfur.  Meds: Current Outpatient Prescriptions  Medication Sig Dispense Refill  . albuterol (PROAIR HFA) 108 (90 BASE) MCG/ACT inhaler Inhale 2 puffs into the lungs every 6 (six) hours as needed for wheezing or shortness of breath.    . ALPRAZolam (XANAX) 0.25 MG tablet Take 0.125-0.25 mg by mouth 2 (two) times daily as needed for anxiety.     Marland Kitchen anastrozole (ARIMIDEX) 1 MG tablet Take 1 tablet (1 mg total) by mouth daily. 90 tablet 4  . aspirin 81 MG chewable tablet Chew 81 mg by mouth daily.    .  cilostazol (PLETAL) 100 MG tablet Take 100 mg by mouth 2 (two) times daily.    Marland Kitchen ketoconazole (NIZORAL) 2 % cream Apply 1 application topically daily. (Patient taking differently: Apply 1 application topically daily as needed (for rash under breast). ) 15 g 0  . Multiple Vitamins-Minerals (PRESERVISION AREDS 2 PO) Take 1 tablet by mouth daily.     . naproxen sodium (ANAPROX) 220 MG tablet Take 220 mg by mouth 2 (two) times daily as needed (pain).    Marland Kitchen oxyCODONE (OXY IR/ROXICODONE) 5 MG immediate release tablet Take 1 tablet (5 mg total) by mouth every 6 (six) hours as needed for moderate pain, severe pain or breakthrough pain. 10 tablet 0  . pantoprazole (PROTONIX) 20 MG tablet Take 20 mg by mouth daily.    . rosuvastatin (CRESTOR) 10 MG tablet Take 10 mg by mouth at bedtime.    Sallye Lat (EYE DROPS ADVANCED RELIEF) 0.05-0.1-1-1 % SOLN Place 1 drop into both eyes 2 (two) times daily.     Marland Kitchen triamterene-hydrochlorothiazide (MAXZIDE-25) 37.5-25 MG tablet Take 1 tablet by mouth daily.   1  . venlafaxine XR (EFFEXOR-XR) 37.5 MG 24 hr capsule Take 1 capsule (37.5 mg total) by mouth daily with breakfast. 90 capsule 4  . dicyclomine (BENTYL) 10 MG capsule Take 1 capsule (10 mg total) by mouth every 8 (eight) hours. (Patient not taking: Reported on 11/13/2016) 30 capsule 3  . oxyCODONE (OXY IR/ROXICODONE) 5 MG immediate  release tablet Take 1-2 tablets (5-10 mg total) by mouth every 6 (six) hours as needed for moderate pain, severe pain or breakthrough pain. (Patient not taking: Reported on 12/25/2016) 18 tablet 0   No current facility-administered medications for this encounter.     Physical Findings: The patient is in no acute distress. Patient is alert and oriented.  height is '5\' 6"'  (1.676 m) and weight is 317 lb 9.6 oz (144.1 kg) (abnormal). Her oral temperature is 99.2 F (37.3 C). Her blood pressure is 131/70 and her pulse is 93. Her oxygen saturation is 95%.  Lungs are clear to  auscultation bilaterally. Heart has regular rate and rhythm. No palpable cervical, supraclavicular, or axillary adenopathy. Abdomen soft, non-tender, normal bowel sounds. Left breast: she has a single stitch in place along lumpectomy scar at upper outer quadrant. Second scar at axillary region healing well without drainage or infection. Slight separation of the lumpectomy scar approximately 5 mm. No signs of infection within the breast.   Lab Findings: Lab Results  Component Value Date   WBC 8.1 12/12/2016   HGB 15.8 (H) 12/12/2016   HCT 48.2 (H) 12/12/2016   MCV 93.8 12/12/2016   PLT 213 12/12/2016    Radiographic Findings: No results found.  Impression:  Stage I (pT1c, pN0) Invasive Ductal Carcinoma of the Left Breast, Er positive, PR positive, Her2-neu negative. . Patient would be a good candidate for breast conservation therapy with radiation therapy directed at left breast. Given the size of her breast, I anticipate a standard course of 5.5 weeks of therapy consisting of 28 treatments. No boost as planned in light of the clear reexcision. She did meet with plastic surgery prior to her lumpectomy and she was not felt to be a good candidate for breast reduction with her planned lumpectomy. She may be treated in the prone position; but will determine this at the time of her simulation.  Plan:  Patient will be simulated the afternoon of July 30th or 31st with treatments to begin approximately 6 weeks post-op.  -----------------------------------  Blair Promise, PhD, MD  This document serves as a record of services personally performed by Gery Pray, MD. It was created on his behalf by Linward Natal, a trained medical scribe. The creation of this record is based on the scribe's personal observations and the provider's statements to them. This document has been checked and approved by the attending provider.

## 2017-01-12 ENCOUNTER — Ambulatory Visit
Admission: RE | Admit: 2017-01-12 | Discharge: 2017-01-12 | Disposition: A | Discharge status: Home / Self Care | Payer: Medicare Other | Source: Ambulatory Visit | Attending: Radiation Oncology | Admitting: Radiation Oncology

## 2017-01-12 DIAGNOSIS — Z510 Encounter for antineoplastic radiation therapy: Secondary | ICD-10-CM | POA: Insufficient documentation

## 2017-01-12 DIAGNOSIS — Z170 Estrogen receptor positive status [ER+]: Secondary | ICD-10-CM | POA: Diagnosis not present

## 2017-01-12 DIAGNOSIS — C50412 Malignant neoplasm of upper-outer quadrant of left female breast: Secondary | ICD-10-CM

## 2017-01-12 NOTE — Progress Notes (Signed)
  Radiation Oncology         [phone]) [phone] ________________________________  Name: Daisy [name] MRN: [id number]  Date: 01/12/2017  DOB: Feb 15, 1951  SIMULATION AND TREATMENT PLANNING NOTE    ICD-10-CM   1. Malignant neoplasm of upper-outer quadrant of left breast in female, estrogen receptor positive ([place]) C50.412    Z17.0     DIAGNOSIS:  66 y.o. woman with Stage I (pT1c, pN0) Invasive Ductal Carcinoma of the Left Breast, ER positive, PR positive, Her2-neu negative.  NARRATIVE:  The patient was brought to the [facility].  Identity was confirmed.  All relevant records and images related to the planned course of therapy were reviewed.  The patient freely provided informed written consent to proceed with treatment after reviewing the details related to the planned course of therapy. The consent form was witnessed and verified by the simulation staff.  Then, the patient was set-up in a stable reproducible  supine position for radiation therapy.  CT images were obtained.  Surface markings were placed.  The CT images were loaded into the planning software.  Then the target and avoidance structures were contoured.  Treatment planning then occurred.  The radiation prescription was entered and confirmed.  Then, I designed and supervised the construction of a total of 3 medically necessary complex treatment devices.  I have requested : 3D Simulation  I have requested a DVH of the following structures: heart, lungs, CTV.  I have ordered:dose calc.  PLAN:  The patient will receive 50.4 Gy in 28 fractions. No boost as planned in light of clear reexcision specimen.  -----------------------------------  Blair Promise, PhD, MD  This document serves as a record of services personally performed by Gery Pray MD. It was created on his behalf by Delton Coombes, a trained medical scribe. The creation of this record is based on the scribe's personal observations and the provider's  statements to them. This document has been checked and approved by the attending provider.

## 2017-01-14 ENCOUNTER — Encounter: Payer: Self-pay | Admitting: *Deleted

## 2017-01-14 DIAGNOSIS — Z510 Encounter for antineoplastic radiation therapy: Secondary | ICD-10-CM | POA: Diagnosis not present

## 2017-01-14 NOTE — Progress Notes (Signed)
[place] Work  Clinical Social Work was referred by patient for assessment of psychosocial needs due to transportation concerns.  Pt in tears as she shared she could not afford gas to get her radiation, but was told she must get radiation at [facility] as her "team is here". Pt shared she lives just a few minutes from [facility]. Clinical Social Worker shared options for transportation assistance. Pt thinks she can get rides to [facility], but both daughters work during the day and cannot bring her. Few options cross county lines and only driver option is [employer]. CSW educated pt about [employer] in [place], [place], [place] and [place] to assist. Pt appears to meet income guidelines for all of these assistance options and will review applications, gather needed documents and then meet with CSW on 10/22/72 to complete applications. CSW to make referral to financial counselor team to assist with [employer] gas card program and grant. Pt very appreciative. CSW team to follow.   Clinical Social Work interventions:  Resource education and referral  Loren Racer, LCSW, OSW-C Clinical Social Worker [place]  [place] Phone: [phone] Fax: [phone]

## 2017-01-16 ENCOUNTER — Telehealth: Payer: Self-pay | Admitting: Hematology and Oncology

## 2017-01-16 NOTE — Telephone Encounter (Signed)
Scheduled appt per 8/2 sch message - patient is aware of appt date and time.

## 2017-01-18 ENCOUNTER — Other Ambulatory Visit: Payer: Self-pay | Admitting: Oncology

## 2017-01-19 ENCOUNTER — Ambulatory Visit
Admission: RE | Admit: 2017-01-19 | Discharge: 2017-01-19 | Disposition: A | Discharge status: Home / Self Care | Payer: Medicare Other | Source: Ambulatory Visit | Attending: Radiation Oncology | Admitting: Radiation Oncology

## 2017-01-19 DIAGNOSIS — Z510 Encounter for antineoplastic radiation therapy: Secondary | ICD-10-CM | POA: Diagnosis not present

## 2017-01-19 DIAGNOSIS — C50411 Malignant neoplasm of upper-outer quadrant of right female breast: Secondary | ICD-10-CM

## 2017-01-19 DIAGNOSIS — Z170 Estrogen receptor positive status [ER+]: Principal | ICD-10-CM

## 2017-01-19 NOTE — Progress Notes (Signed)
  Radiation Oncology         [phone] ________________________________  Name: Daisy [name] MRN: [id number]  Date: 01/19/2017  DOB: [birth year]  Simulation Verification Note    ICD-10-CM   1. Malignant neoplasm of upper-outer quadrant of right breast in female, estrogen receptor positive ([place]) C50.411    Z17.0     Status: outpatient  NARRATIVE: The patient was brought to the treatment unit and placed in the planned treatment position. The clinical setup was verified. Then port films were obtained and uploaded to the radiation oncology medical record software.  The treatment beams were carefully compared against the planned radiation fields. The position location and shape of the radiation fields was reviewed. They targeted volume of tissue appears to be appropriately covered by the radiation beams. Organs at risk appear to be excluded as planned.  Based on my personal review, I approved the simulation verification. The patient's treatment will proceed as planned.  -----------------------------------  Blair Promise, PhD, MD

## 2017-01-20 ENCOUNTER — Ambulatory Visit
Admission: RE | Admit: 2017-01-20 | Discharge: 2017-01-20 | Disposition: A | Discharge status: Home / Self Care | Payer: Medicare Other | Source: Ambulatory Visit | Attending: Radiation Oncology | Admitting: Radiation Oncology

## 2017-01-20 DIAGNOSIS — Z79899 Other long term (current) drug therapy: Secondary | ICD-10-CM | POA: Insufficient documentation

## 2017-01-20 DIAGNOSIS — Z9889 Other specified postprocedural states: Secondary | ICD-10-CM | POA: Insufficient documentation

## 2017-01-20 DIAGNOSIS — C50411 Malignant neoplasm of upper-outer quadrant of right female breast: Secondary | ICD-10-CM

## 2017-01-20 DIAGNOSIS — Z79891 Long term (current) use of opiate analgesic: Secondary | ICD-10-CM | POA: Diagnosis not present

## 2017-01-20 DIAGNOSIS — Z7982 Long term (current) use of aspirin: Secondary | ICD-10-CM | POA: Insufficient documentation

## 2017-01-20 DIAGNOSIS — C50412 Malignant neoplasm of upper-outer quadrant of left female breast: Secondary | ICD-10-CM | POA: Diagnosis not present

## 2017-01-20 DIAGNOSIS — Z888 Allergy status to other drugs, medicaments and biological substances status: Secondary | ICD-10-CM | POA: Diagnosis not present

## 2017-01-20 DIAGNOSIS — Z170 Estrogen receptor positive status [ER+]: Secondary | ICD-10-CM | POA: Diagnosis present

## 2017-01-20 MED ORDER — ALRA NON-METALLIC DEODORANT (RAD-ONC)
1.0000 "application " | Freq: Once | TOPICAL | Status: AC
  Administered 2017-01-20: 1 via TOPICAL

## 2017-01-20 MED ORDER — RADIAPLEXRX EX GEL
Freq: Once | CUTANEOUS | Status: AC
  Administered 2017-01-20: 18:00:00 via TOPICAL

## 2017-01-20 NOTE — Progress Notes (Signed)
Pt here for patient teaching.  Pt given Radiation and You booklet, skin care instructions, Alra deodorant and Radiaplex gel.  Reviewed areas of pertinence such as fatigue and skin changes . Pt able to give teach back of to pat skin and use unscented/gentle soap,apply Radiaplex bid, avoid applying anything to skin within 4 hours of treatment and to use an electric razor if they must shave. Pt demonstrated understanding and verbalizes understanding of information given and will contact nursing with any questions or concerns.

## 2017-01-21 ENCOUNTER — Ambulatory Visit
Admission: RE | Admit: 2017-01-21 | Discharge: 2017-01-21 | Disposition: A | Discharge status: Home / Self Care | Payer: Medicare Other | Source: Ambulatory Visit | Attending: Radiation Oncology | Admitting: Radiation Oncology

## 2017-01-21 ENCOUNTER — Encounter: Payer: Self-pay | Admitting: *Deleted

## 2017-01-21 DIAGNOSIS — Z510 Encounter for antineoplastic radiation therapy: Secondary | ICD-10-CM | POA: Diagnosis not present

## 2017-01-21 NOTE — Progress Notes (Signed)
Daisy [name]  Clinical Social [name] was referred by patient for assessment of psychosocial needs.  Clinical Social Worker met with patient at [facility] to offer support and assess for needs. Pt brought in [name] application with needed documents. CSW to complete needed medical form and submit to [employer] in [place] accordingly. No other needs noted. Pt in great spirits today!   Clinical Social [name] interventions:  Resource education and referral  Loren Racer, LCSW, OSW-C Clinical Social Worker [place]  [place] Phone: [phone] Fax: [phone]

## 2017-01-22 ENCOUNTER — Ambulatory Visit
Admission: RE | Admit: 2017-01-22 | Discharge: 2017-01-22 | Disposition: A | Discharge status: Home / Self Care | Payer: Medicare Other | Source: Ambulatory Visit | Attending: Radiation Oncology | Admitting: Radiation Oncology

## 2017-01-22 DIAGNOSIS — Z510 Encounter for antineoplastic radiation therapy: Secondary | ICD-10-CM | POA: Diagnosis not present

## 2017-01-23 ENCOUNTER — Ambulatory Visit: Payer: Medicare Other

## 2017-01-23 ENCOUNTER — Telehealth: Payer: Self-pay | Admitting: Oncology

## 2017-01-23 NOTE — Telephone Encounter (Signed)
Patient called back and said she has an appointment at Dr. Cristal Generous office at 11 am.

## 2017-01-23 NOTE — Telephone Encounter (Signed)
Patient called back and said she went to her appointment at the surgeon's office and was told her breast just needs to drain.  Thanked her for letting us know.

## 2017-01-23 NOTE — Telephone Encounter (Signed)
Patient called and said her left breast started draining pink fluid during the night.  She woke up and the whole side of her bra was wet.  She said it looks like her scar has opened a little bit and it is draining a small amount now.  She has not had any drainage for about a week and denies having any new redness to the breast, it is not warm to the touch and she is not running a fever.  Advised her to call Dr. Cristal Generous office and notify his nurse.

## 2017-01-26 ENCOUNTER — Ambulatory Visit
Admission: RE | Admit: 2017-01-26 | Discharge: 2017-01-26 | Disposition: A | Discharge status: Home / Self Care | Payer: Medicare Other | Source: Ambulatory Visit | Attending: Radiation Oncology | Admitting: Radiation Oncology

## 2017-01-26 DIAGNOSIS — Z510 Encounter for antineoplastic radiation therapy: Secondary | ICD-10-CM | POA: Diagnosis not present

## 2017-01-27 ENCOUNTER — Ambulatory Visit
Admission: RE | Admit: 2017-01-27 | Discharge: 2017-01-27 | Disposition: A | Discharge status: Home / Self Care | Payer: Medicare Other | Source: Ambulatory Visit | Attending: Radiation Oncology | Admitting: Radiation Oncology

## 2017-01-27 DIAGNOSIS — Z510 Encounter for antineoplastic radiation therapy: Secondary | ICD-10-CM | POA: Diagnosis not present

## 2017-01-28 ENCOUNTER — Ambulatory Visit
Admission: RE | Admit: 2017-01-28 | Discharge: 2017-01-28 | Disposition: A | Discharge status: Home / Self Care | Payer: Medicare Other | Source: Ambulatory Visit | Attending: Radiation Oncology | Admitting: Radiation Oncology

## 2017-01-28 DIAGNOSIS — Z510 Encounter for antineoplastic radiation therapy: Secondary | ICD-10-CM | POA: Diagnosis not present

## 2017-01-29 ENCOUNTER — Ambulatory Visit
Admission: RE | Admit: 2017-01-29 | Discharge: 2017-01-29 | Disposition: A | Discharge status: Home / Self Care | Payer: Medicare Other | Source: Ambulatory Visit | Attending: Radiation Oncology | Admitting: Radiation Oncology

## 2017-01-29 DIAGNOSIS — Z510 Encounter for antineoplastic radiation therapy: Secondary | ICD-10-CM | POA: Diagnosis not present

## 2017-01-30 ENCOUNTER — Ambulatory Visit
Admission: RE | Admit: 2017-01-30 | Discharge: 2017-01-30 | Disposition: A | Discharge status: Home / Self Care | Payer: Medicare Other | Source: Ambulatory Visit | Attending: Radiation Oncology | Admitting: Radiation Oncology

## 2017-01-30 DIAGNOSIS — Z510 Encounter for antineoplastic radiation therapy: Secondary | ICD-10-CM | POA: Diagnosis not present

## 2017-02-02 ENCOUNTER — Ambulatory Visit
Admission: RE | Admit: 2017-02-02 | Discharge: 2017-02-02 | Disposition: A | Discharge status: Home / Self Care | Payer: Medicare Other | Source: Ambulatory Visit | Attending: Radiation Oncology | Admitting: Radiation Oncology

## 2017-02-02 DIAGNOSIS — Z510 Encounter for antineoplastic radiation therapy: Secondary | ICD-10-CM | POA: Diagnosis not present

## 2017-02-03 ENCOUNTER — Ambulatory Visit
Admission: RE | Admit: 2017-02-03 | Discharge: 2017-02-03 | Disposition: A | Discharge status: Home / Self Care | Payer: Medicare Other | Source: Ambulatory Visit | Attending: Radiation Oncology | Admitting: Radiation Oncology

## 2017-02-03 DIAGNOSIS — Z510 Encounter for antineoplastic radiation therapy: Secondary | ICD-10-CM | POA: Diagnosis not present

## 2017-02-04 ENCOUNTER — Ambulatory Visit
Admission: RE | Admit: 2017-02-04 | Discharge: 2017-02-04 | Disposition: A | Discharge status: Home / Self Care | Payer: Medicare Other | Source: Ambulatory Visit | Attending: Radiation Oncology | Admitting: Radiation Oncology

## 2017-02-04 DIAGNOSIS — Z510 Encounter for antineoplastic radiation therapy: Secondary | ICD-10-CM | POA: Diagnosis not present

## 2017-02-05 ENCOUNTER — Ambulatory Visit: Payer: Medicare Other | Admitting: Oncology

## 2017-02-05 ENCOUNTER — Other Ambulatory Visit: Payer: Medicare Other

## 2017-02-05 ENCOUNTER — Ambulatory Visit
Admission: RE | Admit: 2017-02-05 | Discharge: 2017-02-05 | Disposition: A | Discharge status: Home / Self Care | Payer: Medicare Other | Source: Ambulatory Visit | Attending: Radiation Oncology | Admitting: Radiation Oncology

## 2017-02-05 DIAGNOSIS — Z510 Encounter for antineoplastic radiation therapy: Secondary | ICD-10-CM | POA: Diagnosis not present

## 2017-02-06 ENCOUNTER — Ambulatory Visit
Admission: RE | Admit: 2017-02-06 | Discharge: 2017-02-06 | Disposition: A | Discharge status: Home / Self Care | Payer: Medicare Other | Source: Ambulatory Visit | Attending: Radiation Oncology | Admitting: Radiation Oncology

## 2017-02-06 DIAGNOSIS — Z510 Encounter for antineoplastic radiation therapy: Secondary | ICD-10-CM | POA: Diagnosis not present

## 2017-02-09 ENCOUNTER — Ambulatory Visit
Admission: RE | Admit: 2017-02-09 | Discharge: 2017-02-09 | Disposition: A | Discharge status: Home / Self Care | Payer: Medicare Other | Source: Ambulatory Visit | Attending: Radiation Oncology | Admitting: Radiation Oncology

## 2017-02-09 DIAGNOSIS — Z510 Encounter for antineoplastic radiation therapy: Secondary | ICD-10-CM | POA: Diagnosis not present

## 2017-02-10 ENCOUNTER — Ambulatory Visit
Admission: RE | Admit: 2017-02-10 | Discharge: 2017-02-10 | Disposition: A | Discharge status: Home / Self Care | Payer: Medicare Other | Source: Ambulatory Visit | Attending: Radiation Oncology | Admitting: Radiation Oncology

## 2017-02-10 DIAGNOSIS — C50412 Malignant neoplasm of upper-outer quadrant of left female breast: Secondary | ICD-10-CM | POA: Diagnosis not present

## 2017-02-10 DIAGNOSIS — Z510 Encounter for antineoplastic radiation therapy: Secondary | ICD-10-CM | POA: Diagnosis not present

## 2017-02-10 DIAGNOSIS — Z170 Estrogen receptor positive status [ER+]: Principal | ICD-10-CM

## 2017-02-10 MED ORDER — SONAFINE EX EMUL
1.0000 "application " | Freq: Once | CUTANEOUS | Status: AC
  Administered 2017-02-10: 1 via TOPICAL

## 2017-02-11 ENCOUNTER — Ambulatory Visit
Admission: RE | Admit: 2017-02-11 | Discharge: 2017-02-11 | Disposition: A | Discharge status: Home / Self Care | Payer: Medicare Other | Source: Ambulatory Visit | Attending: Radiation Oncology | Admitting: Radiation Oncology

## 2017-02-11 DIAGNOSIS — Z510 Encounter for antineoplastic radiation therapy: Secondary | ICD-10-CM | POA: Diagnosis not present

## 2017-02-12 ENCOUNTER — Ambulatory Visit
Admission: RE | Admit: 2017-02-12 | Discharge: 2017-02-12 | Disposition: A | Discharge status: Home / Self Care | Payer: Medicare Other | Source: Ambulatory Visit | Attending: Radiation Oncology | Admitting: Radiation Oncology

## 2017-02-12 DIAGNOSIS — Z510 Encounter for antineoplastic radiation therapy: Secondary | ICD-10-CM | POA: Diagnosis not present

## 2017-02-13 ENCOUNTER — Ambulatory Visit
Admission: RE | Admit: 2017-02-13 | Discharge: 2017-02-13 | Disposition: A | Discharge status: Home / Self Care | Payer: Medicare Other | Source: Ambulatory Visit | Attending: Radiation Oncology | Admitting: Radiation Oncology

## 2017-02-13 DIAGNOSIS — Z510 Encounter for antineoplastic radiation therapy: Secondary | ICD-10-CM | POA: Diagnosis not present

## 2017-02-17 ENCOUNTER — Ambulatory Visit
Admission: RE | Admit: 2017-02-17 | Discharge: 2017-02-17 | Disposition: A | Discharge status: Home / Self Care | Payer: Medicare Other | Source: Ambulatory Visit | Attending: Radiation Oncology | Admitting: Radiation Oncology

## 2017-02-17 DIAGNOSIS — Z510 Encounter for antineoplastic radiation therapy: Secondary | ICD-10-CM | POA: Diagnosis not present

## 2017-02-18 ENCOUNTER — Ambulatory Visit
Admission: RE | Admit: 2017-02-18 | Discharge: 2017-02-18 | Disposition: A | Discharge status: Home / Self Care | Payer: Medicare Other | Source: Ambulatory Visit | Attending: Radiation Oncology | Admitting: Radiation Oncology

## 2017-02-18 DIAGNOSIS — Z510 Encounter for antineoplastic radiation therapy: Secondary | ICD-10-CM | POA: Diagnosis not present

## 2017-02-19 ENCOUNTER — Ambulatory Visit
Admission: RE | Admit: 2017-02-19 | Discharge: 2017-02-19 | Disposition: A | Discharge status: Home / Self Care | Payer: Medicare Other | Source: Ambulatory Visit | Attending: Radiation Oncology | Admitting: Radiation Oncology

## 2017-02-19 DIAGNOSIS — Z510 Encounter for antineoplastic radiation therapy: Secondary | ICD-10-CM | POA: Diagnosis not present

## 2017-02-20 ENCOUNTER — Ambulatory Visit
Admission: RE | Admit: 2017-02-20 | Discharge: 2017-02-20 | Disposition: A | Discharge status: Home / Self Care | Payer: Medicare Other | Source: Ambulatory Visit | Attending: Radiation Oncology | Admitting: Radiation Oncology

## 2017-02-20 DIAGNOSIS — Z510 Encounter for antineoplastic radiation therapy: Secondary | ICD-10-CM | POA: Diagnosis not present

## 2017-02-23 ENCOUNTER — Ambulatory Visit
Admission: RE | Admit: 2017-02-23 | Discharge: 2017-02-23 | Disposition: A | Discharge status: Home / Self Care | Payer: Medicare Other | Source: Ambulatory Visit | Attending: Radiation Oncology | Admitting: Radiation Oncology

## 2017-02-23 DIAGNOSIS — Z510 Encounter for antineoplastic radiation therapy: Secondary | ICD-10-CM | POA: Diagnosis not present

## 2017-02-24 ENCOUNTER — Ambulatory Visit
Admission: RE | Admit: 2017-02-24 | Discharge: 2017-02-24 | Disposition: A | Discharge status: Home / Self Care | Payer: Medicare Other | Source: Ambulatory Visit | Attending: Radiation Oncology | Admitting: Radiation Oncology

## 2017-02-24 DIAGNOSIS — Z510 Encounter for antineoplastic radiation therapy: Secondary | ICD-10-CM | POA: Diagnosis not present

## 2017-02-25 ENCOUNTER — Ambulatory Visit
Admission: RE | Admit: 2017-02-25 | Discharge: 2017-02-25 | Disposition: A | Discharge status: Home / Self Care | Payer: Medicare Other | Source: Ambulatory Visit | Attending: Radiation Oncology | Admitting: Radiation Oncology

## 2017-02-25 DIAGNOSIS — Z510 Encounter for antineoplastic radiation therapy: Secondary | ICD-10-CM | POA: Diagnosis not present

## 2017-02-26 ENCOUNTER — Other Ambulatory Visit: Payer: Self-pay

## 2017-02-26 ENCOUNTER — Ambulatory Visit
Admission: RE | Admit: 2017-02-26 | Discharge: 2017-02-26 | Disposition: A | Discharge status: Home / Self Care | Payer: Medicare Other | Source: Ambulatory Visit | Attending: Radiation Oncology | Admitting: Radiation Oncology

## 2017-02-26 ENCOUNTER — Ambulatory Visit: Payer: Medicare Other | Admitting: Hematology and Oncology

## 2017-02-26 DIAGNOSIS — Z170 Estrogen receptor positive status [ER+]: Principal | ICD-10-CM

## 2017-02-26 DIAGNOSIS — C50411 Malignant neoplasm of upper-outer quadrant of right female breast: Secondary | ICD-10-CM

## 2017-02-26 DIAGNOSIS — C50412 Malignant neoplasm of upper-outer quadrant of left female breast: Secondary | ICD-10-CM

## 2017-02-26 DIAGNOSIS — Z510 Encounter for antineoplastic radiation therapy: Secondary | ICD-10-CM | POA: Diagnosis not present

## 2017-02-27 ENCOUNTER — Ambulatory Visit
Admission: RE | Admit: 2017-02-27 | Discharge: 2017-02-27 | Disposition: A | Discharge status: Home / Self Care | Payer: Medicare Other | Source: Ambulatory Visit | Attending: Radiation Oncology | Admitting: Radiation Oncology

## 2017-02-27 ENCOUNTER — Other Ambulatory Visit (HOSPITAL_BASED_OUTPATIENT_CLINIC_OR_DEPARTMENT_OTHER): Payer: Medicare Other

## 2017-02-27 ENCOUNTER — Ambulatory Visit: Payer: Medicare Other

## 2017-02-27 ENCOUNTER — Ambulatory Visit: Payer: Medicare Other | Admitting: Hematology and Oncology

## 2017-02-27 ENCOUNTER — Ambulatory Visit (HOSPITAL_BASED_OUTPATIENT_CLINIC_OR_DEPARTMENT_OTHER): Payer: Medicare Other | Admitting: Oncology

## 2017-02-27 VITALS — BP 122/48 | HR 98 | Temp 98.1°F | Resp 18 | Ht 66.0 in | Wt 318.6 lb

## 2017-02-27 DIAGNOSIS — Z170 Estrogen receptor positive status [ER+]: Secondary | ICD-10-CM

## 2017-02-27 DIAGNOSIS — Z79811 Long term (current) use of aromatase inhibitors: Secondary | ICD-10-CM

## 2017-02-27 DIAGNOSIS — Z720 Tobacco use: Secondary | ICD-10-CM | POA: Diagnosis not present

## 2017-02-27 DIAGNOSIS — C50411 Malignant neoplasm of upper-outer quadrant of right female breast: Secondary | ICD-10-CM

## 2017-02-27 DIAGNOSIS — Z510 Encounter for antineoplastic radiation therapy: Secondary | ICD-10-CM | POA: Diagnosis not present

## 2017-02-27 DIAGNOSIS — C50412 Malignant neoplasm of upper-outer quadrant of left female breast: Secondary | ICD-10-CM | POA: Diagnosis not present

## 2017-02-27 LAB — CBC WITH DIFFERENTIAL/PLATELET
BASO%: 0.8 % (ref 0.0–2.0)
Basophils Absolute: 0 10*3/uL (ref 0.0–0.1)
EOS%: 2 % (ref 0.0–7.0)
Eosinophils Absolute: 0.1 10*3/uL (ref 0.0–0.5)
HEMATOCRIT: 44.8 % (ref 34.8–46.6)
HGB: 15 g/dL (ref 11.6–15.9)
LYMPH#: 1.2 10*3/uL (ref 0.9–3.3)
LYMPH%: 18.7 % (ref 14.0–49.7)
MCH: 31.5 pg (ref 25.1–34.0)
MCHC: 33.4 g/dL (ref 31.5–36.0)
MCV: 94.4 fL (ref 79.5–101.0)
MONO#: 0.5 10*3/uL (ref 0.1–0.9)
MONO%: 7.5 % (ref 0.0–14.0)
NEUT%: 71 % (ref 38.4–76.8)
NEUTROS ABS: 4.7 10*3/uL (ref 1.5–6.5)
Platelets: 179 10*3/uL (ref 145–400)
RBC: 4.75 10*6/uL (ref 3.70–5.45)
RDW: 15.1 % — ABNORMAL HIGH (ref 11.2–14.5)
WBC: 6.6 10*3/uL (ref 3.9–10.3)

## 2017-02-27 LAB — COMPREHENSIVE METABOLIC PANEL
ALBUMIN: 3.3 g/dL — AB (ref 3.5–5.0)
ALK PHOS: 78 U/L (ref 40–150)
ALT: 9 U/L (ref 0–55)
AST: 14 U/L (ref 5–34)
Anion Gap: 9 mEq/L (ref 3–11)
BILIRUBIN TOTAL: 0.6 mg/dL (ref 0.20–1.20)
BUN: 7.7 mg/dL (ref 7.0–26.0)
CALCIUM: 10.2 mg/dL (ref 8.4–10.4)
CO2: 32 mEq/L — ABNORMAL HIGH (ref 22–29)
CREATININE: 0.9 mg/dL (ref 0.6–1.1)
Chloride: 95 mEq/L — ABNORMAL LOW (ref 98–109)
EGFR: 64 mL/min/{1.73_m2} — ABNORMAL LOW (ref >90)
GLUCOSE: 127 mg/dL (ref 70–140)
Potassium: 4.4 mEq/L (ref 3.5–5.1)
SODIUM: 137 meq/L (ref 136–145)
TOTAL PROTEIN: 7 g/dL (ref 6.4–8.3)

## 2017-02-27 NOTE — Progress Notes (Signed)
Daisy [name]  [phone] [phone]     ID: Daisy [name] DOB: Mar 16, 1951  MR#: [id number]  LPF#:[id number]  Patient Care Team: Alvester Chou, NP as PCP - General (Nurse Practitioner) Rolm Bookbinder, MD as Consulting Physician (General Surgery) Magrinat, Virgie Dad, MD as Consulting Physician (Oncology) Gery Pray, MD as Consulting Physician (Radiation Oncology) Lorelle Gibbs, MD (Radiology) Armbruster, Carlota Raspberry, MD as Consulting Physician (Gastroenterology) Adrian Prows, MD as Consulting Physician (Cardiology) Earlie Server, MD as Consulting Physician (Orthopedic Surgery) Chauncey Cruel, MD OTHER MD:  CHIEF COMPLAINT: Estrogen receptor positive breast cancer  CURRENT TREATMENT: Anastrozole   BREAST CANCER HISTORY: From the original intake note:  Daisy [name] had routine screening mammography at [facility] 09/17/2016. The breast density was category A. At the 12:00 location anteriorly in the left breast there was an area of linear calcifications measuring 0.3 cm. There was also an area of architectural distortion in the left breast upper outer quadrant. On 09/23/2016 the patient underwent left diagnostic mammography and this confirmed an area of calcifications and architectural distortion. Accordingly on 09/24/2016 the patient underwent left breast ultrasonography which did not show any abnormality in the left breast or left axilla.  On 09/24/2016 the patient underwent biopsy of the left breast area of architectural distortion and this showed ([id number]) invasive ductal carcinoma, grade 1, estrogen receptor 100% positive, and progesterone receptor 100% positive, both with strong staining intensity, with an MIB-1 of 5%, and no HER-2 amplification, the signals ratio being 1.51 and the number per cell 2.65.  The patient's subsequent history is as detailed below.  INTERVAL HISTORY: Daisy [name] returns today for follow-up and treatment of her  estrogen receptor positive breast cancer, accompanied by a friend. Since her last visit here, Daisy [name] underwent left lumpectomy and sentinel lymph node sampling, on 11/19/2016. The final pathology ([id number]) showed invasive ductal carcinoma, grade 2, measuring 1.9 cm, all 3 sentinel lymph nodes were negative. However the medial margin was involved. This was cleared with subsequent surgery 12/08/2016 (as CA [id number]).  An Oncotype DX score was obtained from the definitive surgical sample. This was 70, predicting a 10 year risk of recurrence outside the breast of 9% if the patient's only systemic therapy is tamoxifen for 5 years. It also predicted a benefit from chemotherapy.  Accordingly she proceeded directly to radiation, which she is currently receiving. She will complete her radiation treatments 03/02/2017. She notes that she has had difficulty with radiation, as she has had radiation related skin changes and breakdown to her breast. Pt reports that she no longer wears a bra due to the skin changes.  Daisy [name] continued on anastrozole throughout her treatments. She notes that she has had hot flashes and denies vaginal dryness while on anastrozole. Pt reports that she is otherwise tolerating anastrozole well.   REVIEW OF SYSTEMS: Pt reports radiation related skin changes and breakdown to her breast. Pt reports fatigue and decreased appetite. Pt denies nausea. She denies unusual headaches, visual changes, nausea, vomiting, or dizziness. There has been no unusual cough, phlegm production, or pleurisy. This been no change in bowel or bladder habits. She denies unexplained weight loss, bleeding, or fever. A detailed review of systems today was otherwise stable.      PAST MEDICAL HISTORY: Past Medical History:  Diagnosis Date  . Acid reflux   . Arthritis   . Cholecystitis   . Depression   . Dyspnea   . Hyperlipemia   . Irregular heart rate   . Malignant neoplasm  of upper-outer quadrant of left breast  in female, estrogen receptor positive ([place]) 09/30/2016  . Obesity   . Peripheral vascular disease ([place])   . Pneumonia     PAST SURGICAL HISTORY: Past Surgical History:  Procedure Laterality Date  . ABDOMINAL HYSTERECTOMY    . BREAST LUMPECTOMY Left 11/19/2016   BREAST LUMPECTOMY WITH RADIOACTIVE SEED AND SENTINEL LYMPH NODE BIOPSY (Left)  . BREAST LUMPECTOMY WITH RADIOACTIVE SEED AND SENTINEL LYMPH NODE BIOPSY Left 11/19/2016   Procedure: BREAST LUMPECTOMY WITH RADIOACTIVE SEED AND SENTINEL LYMPH NODE BIOPSY;  Surgeon: Rolm Bookbinder, MD;  Location: [facility];  Service: General;  Laterality: Left;  . CESAREAN SECTION    . CESAREAN SECTION     x 2  . CHOLECYSTECTOMY  2007  . KNEE ARTHROSCOPY     bilateral  . KNEE SURGERY Bilateral   . LOWER EXTREMITY ANGIOGRAM N/A 04/04/2014   Procedure: LOWER EXTREMITY ANGIOGRAM;  Surgeon: Laverda Page, MD;  Location: [facility] CATH LAB;  Service: Cardiovascular;  Laterality: N/A;  . OVARIAN CYST REMOVAL    . RE-EXCISION OF BREAST LUMPECTOMY Left 12/12/2016   Procedure: RE-EXCISION OF LEFT BREAST LUMPECTOMY;  Surgeon: Rolm Bookbinder, MD;  Location: [place] [facility];  Service: General;  Laterality: Left;  . ROTATOR CUFF REPAIR Right   . TONSILLECTOMY    . WRIST SURGERY Bilateral     FAMILY HISTORY Family History  Problem Relation Age of Onset  . Heart disease Mother   . Lung cancer Father   . Heart disease Father   . Heart disease Brother   The patient's father died at age 8 from lung cancer in the setting of tobacco abuse. The patient's mother died at the age of 20 from pneumonia. The patient has 3 brothers, 1 sister. There is no history of breast or ovarian cancer in the family.  GYNECOLOGIC HISTORY:  No LMP recorded. Patient has had a hysterectomy. Menarche age 42, first live birth age 59. The patient is GX P2. She underwent simple hysterectomy without salpingo-oophorectomy in 1986. She did not take hormone replacement. She did use oral contraceptives  remotely for about 5 years, with no complications.  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 At home she lives with her daughter Daisy [name], who is a paramedic and their dog Maurene Capes. Daughter Daisy [name] lives in [place] and works as a Gaffer. The patient has no grandchildren. She is a Psychologist, forensic.    ADVANCED DIRECTIVES: Not in place. The patient was given the appropriate documents to complete and notarize at her discretion in course of her 10/01/2016 visit   HEALTH MAINTENANCE: Social History  Substance Use Topics  . Smoking status: Current Every Day Smoker    Packs/day: 1.00    Years: 30.00    Types: E-cigarettes  . Smokeless tobacco: Never Used  . Alcohol use No     Colonoscopy:Never  PAP:  Bone density: Never   Allergies  Allergen Reactions  . Levaquin [Levofloxacin] Other (See Comments)    Body aches  . Sulfur Hives    Current Outpatient Prescriptions  Medication Sig Dispense Refill  . albuterol (PROAIR HFA) 108 (90 BASE) MCG/ACT inhaler Inhale 2 puffs into the lungs every 6 (six) hours as needed for wheezing or shortness of breath.    . ALPRAZolam (XANAX) 0.25 MG tablet Take 0.125-0.25 mg by mouth 2 (two) times daily as needed for anxiety.     Marland Kitchen anastrozole (ARIMIDEX) 1 MG tablet Take 1 tablet (1 mg total)  by mouth daily. 90 tablet 4  . aspirin 81 MG chewable tablet Chew 81 mg by mouth daily.    . cilostazol (PLETAL) 100 MG tablet Take 100 mg by mouth 2 (two) times daily.    . hyaluronate sodium (RADIAPLEXRX) GEL Apply 1 application topically once.    Marland Kitchen ketoconazole (NIZORAL) 2 % cream Apply 1 application topically daily. (Patient taking differently: Apply 1 application topically daily as needed (for rash under breast). ) 15 g 0  . Multiple Vitamins-Minerals (PRESERVISION AREDS 2 PO) Take 1 tablet by mouth daily.     . non-metallic deodorant Jethro Poling) MISC Apply 1 application topically daily as needed.    . pantoprazole (PROTONIX) 20 MG tablet  Take 20 mg by mouth daily.    . rosuvastatin (CRESTOR) 10 MG tablet Take 10 mg by mouth at bedtime.    Daisy [name] (EYE DROPS ADVANCED RELIEF) 0.05-0.1-1-1 % SOLN Place 1 drop into both eyes 2 (two) times daily.     Marland Kitchen triamterene-hydrochlorothiazide (MAXZIDE-25) 37.5-25 MG tablet Take 1 tablet by mouth daily.   1  . venlafaxine XR (EFFEXOR-XR) 37.5 MG 24 hr capsule Take 1 capsule (37.5 mg total) by mouth daily with breakfast. 90 capsule 4  . Wound Dressings (SONAFINE EX) Apply topically.    . dicyclomine (BENTYL) 10 MG capsule Take 1 capsule (10 mg total) by mouth every 8 (eight) hours. (Patient not taking: Reported on 11/13/2016) 30 capsule 3  . naproxen sodium (ANAPROX) 220 MG tablet Take 220 mg by mouth 2 (two) times daily as needed (pain).     No current facility-administered medications for this visit.     OBJECTIVE: Morbidly obese white woman  In no acute distress Vitals:   02/27/17 1032  BP: (!) 122/48  Pulse: 98  Resp: 18  Temp: 98.1 F (36.7 C)  SpO2: 95%     Body mass index is 51.42 kg/m.    ECOG FS:2 - Symptomatic, <50% confined to bed  Sclerae unicteric, pupils round and equal Oropharynx clear and moist No cervical or supraclavicular adenopathy Lungs no rales or rhonchi Heart regular rate and rhythm Abd soft, nontender, positive bowel sounds MSK no focal spinal tenderness, no upper extremity lymphedema Neuro: nonfocal, well oriented, appropriate affect Breasts: The right breast is unremarkable. The left breast is status post lumpectomy and radiation. The incision had dehisced minimally but is now again closed. There is some irregularity in the breast contour associated with this, but no evidence of recurrence. There is some erythema and some dry desquamation particularly in the inframammary fold. The axillae are benign bilaterally.   LAB RESULTS:  CMP     Component Value Date/Time   NA 137 02/27/2017 1007   K 4.4 02/27/2017 1007   CL 97 (L)  11/17/2016 1306   CL 105 04/08/2014 0355   CO2 32 (H) 02/27/2017 1007   GLUCOSE 127 02/27/2017 1007   BUN 7.7 02/27/2017 1007   CREATININE 0.9 02/27/2017 1007   CALCIUM 10.2 02/27/2017 1007   PROT 7.0 02/27/2017 1007   ALBUMIN 3.3 (L) 02/27/2017 1007   AST 14 02/27/2017 1007   ALT 9 02/27/2017 1007   ALKPHOS 78 02/27/2017 1007   BILITOT 0.60 02/27/2017 1007   GFRNONAA >60 11/17/2016 1306   GFRNONAA 56 (L) 04/08/2014 0355   GFRAA >60 11/17/2016 1306   GFRAA >60 04/08/2014 0355    No results found for: TOTALPROTELP, ALBUMINELP, A1GS, A2GS, BETS, BETA2SER, GAMS, MSPIKE, SPEI  No results found for: KPAFRELGTCHN, LAMBDASER, KAPLAMBRATIO  Lab Results  Component Value Date   WBC 6.6 02/27/2017   NEUTROABS 4.7 02/27/2017   HGB 15.0 02/27/2017   HCT 44.8 02/27/2017   MCV 94.4 02/27/2017   PLT 179 02/27/2017      Chemistry      Component Value Date/Time   NA 137 02/27/2017 1007   K 4.4 02/27/2017 1007   CL 97 (L) 11/17/2016 1306   CL 105 04/08/2014 0355   CO2 32 (H) 02/27/2017 1007   BUN 7.7 02/27/2017 1007   CREATININE 0.9 02/27/2017 1007      Component Value Date/Time   CALCIUM 10.2 02/27/2017 1007   ALKPHOS 78 02/27/2017 1007   AST 14 02/27/2017 1007   ALT 9 02/27/2017 1007   BILITOT 0.60 02/27/2017 1007       No results found for: LABCA2  No components found for: [id number]  No results for input(s): INR in the last 168 hours.  Urinalysis    Component Value Date/Time   COLORURINE YELLOW 03/13/2016 2145   APPEARANCEUR CLEAR 03/13/2016 2145   APPEARANCEUR Cloudy 04/07/2014 2005   LABSPEC 1.017 03/13/2016 2145   LABSPEC 1.006 04/07/2014 2005   PHURINE 6.0 03/13/2016 2145   GLUCOSEU NEGATIVE 03/13/2016 2145   GLUCOSEU Negative 04/07/2014 2005   HGBUR NEGATIVE 03/13/2016 2145   BILIRUBINUR NEGATIVE 03/13/2016 2145   BILIRUBINUR Negative 04/07/2014 2005   KETONESUR NEGATIVE 03/13/2016 2145   PROTEINUR NEGATIVE 03/13/2016 2145   NITRITE NEGATIVE 03/13/2016  2145   LEUKOCYTESUR NEGATIVE 03/13/2016 2145   LEUKOCYTESUR 3+ 04/07/2014 2005     STUDIES: Pathology results reviewed with the patient  ELIGIBLE FOR AVAILABLE RESEARCH PROTOCOL: no  ASSESSMENT: 66 y.o. [place], [place] woman Status post left breast upper outer quadrant biopsy 09/24/2016 of an area of architectural distortion measuring 3.7 cm, showing invasive ductal carcinoma, grade 1, estrogen and progesterone receptor positive, HER-2 nonamplified, with an MIB-1 of 5%.  (a) a 0.3 cm area of linear calcifications in the superior aspect of the breast Was benign on biopsy 10/02/2016   (1) left lumpectomy and sentinel lymph node sampling 11/19/2016 found ([id number]) invasive ductal carcinoma, measuring 1.9 cm, grade 2, with all 3 sentinel lymph nodes clear. However the medial margin was involved  (a) additional surgery 12/12/2016 cleared the involved margin (as CA [id number]  (2) Oncotype DX  score of 14 predicted a 10 year risk of outside the breast recurrence of 9% if the patient's only systemic therapy is tamoxifen for 5 years. It also predicted no benefit from chemotherapy.  (3) adjuvant radiation to be completed 03/02/2017  (4) Anastrozole started April 2018, continued through subsequent treatment.  (5) tobacco abuse: The patient has been strongly advised to discontinue smoking at this point  PLAN: Erza will complete her radiation treatments next week. She has done generally quite well with them. I reassured her that her skin will heal over the next several days, much more quickly than she expects.  The fatigue from radiation will last little longer. The best way to get over that is to exercise. Her friend with her today has signed her [facility] [place]. They're going to be doing water aerobics. This is excellent.  Lakelyn is tolerating anastrozole well. The plan at this point is to continue that for a total of 5 years  She will likely see Dr. Sondra Come at  some point a month or so from now for follow-up of her radiation. I'm going to see her again in January  of next year.  She knows to call for any problems that may develop before that visit.   Magrinat, Virgie Dad, MD  02/27/17 11:03 AM Medical Oncology and Hematology [facility] [address] [place], [place] [address] Tel. [phone]    Fax. [phone]  This document serves as a record of services personally performed by Lurline Del, MD. It was created on her behalf by Steva Colder, a trained medical scribe. The creation of this record is based on the scribe's personal observations and the provider's statements to them. This document has been checked and approved by the attending provider.

## 2017-03-02 ENCOUNTER — Other Ambulatory Visit: Payer: Self-pay | Admitting: Radiation Oncology

## 2017-03-02 ENCOUNTER — Ambulatory Visit: Payer: Medicare Other

## 2017-03-02 ENCOUNTER — Encounter: Payer: Self-pay | Admitting: Radiation Oncology

## 2017-03-02 ENCOUNTER — Telehealth: Payer: Self-pay | Admitting: Oncology

## 2017-03-02 ENCOUNTER — Ambulatory Visit
Admission: RE | Admit: 2017-03-02 | Discharge: 2017-03-02 | Disposition: A | Discharge status: Home / Self Care | Payer: Medicare Other | Source: Ambulatory Visit | Attending: Radiation Oncology | Admitting: Radiation Oncology

## 2017-03-02 DIAGNOSIS — Z170 Estrogen receptor positive status [ER+]: Principal | ICD-10-CM

## 2017-03-02 DIAGNOSIS — C50412 Malignant neoplasm of upper-outer quadrant of left female breast: Secondary | ICD-10-CM

## 2017-03-02 DIAGNOSIS — Z510 Encounter for antineoplastic radiation therapy: Secondary | ICD-10-CM | POA: Diagnosis not present

## 2017-03-02 MED ORDER — OXYCODONE HCL 5 MG PO TABS: 5.0000 mg | ORAL_TABLET | ORAL | 0 refills | Status: DC | PRN

## 2017-03-02 NOTE — Progress Notes (Signed)
  Radiation Oncology         [phone]) [phone] ________________________________  Name: Daisy [name] MRN: [id number]  Date: 03/02/2017  DOB: 08-03-1950  End of Treatment Note  Diagnosis:   66 y.o. woman with Stage I (pT1c, pN0) Invasive Ductal Carcinoma of the Left Breast, ER positive, PR positive, Her2-neu negative.     Indication for treatment:  Curative       Radiation treatment dates:   01/20/2017 to 03/02/2017  Site/dose:   The Left breast was treated to 50.4 Gy in 18 fractions of 1.8 Gy.  Beams/energy:   3D // 10X, 15X  Narrative: The patient tolerated radiation treatment relatively well.   She developed  moist desquamation in the inframammary fold, with associated pain. Patient reports skin on left breast is warm to the touch. She is using Radiaplex, Sonafine, and Neosporin Plus Pain as directed.  Plan: The patient has completed radiation treatment. Given prescription for oxycodone 5 mg today for pain management. The patient will return to radiation oncology clinic for routine followup in one month. I advised them to call or return sooner if they have any questions or concerns related to their recovery or treatment.  -----------------------------------  Blair Promise, PhD, MD  This document serves as a record of services personally performed by Gery Pray, MD. It was created on his behalf by Arlyce Harman, a trained medical scribe. The creation of this record is based on the scribe's personal observations and the provider's statements to them. This document has been checked and approved by the attending provider.

## 2017-03-02 NOTE — Telephone Encounter (Signed)
Scheduled appt per 9/14 los - lab and f/u in January - reminder letter sent in the mail.

## 2017-03-03 ENCOUNTER — Encounter: Payer: Self-pay | Admitting: *Deleted

## 2017-03-03 ENCOUNTER — Ambulatory Visit: Payer: Medicare Other

## 2017-03-25 ENCOUNTER — Encounter: Payer: Self-pay | Admitting: Oncology

## 2017-03-26 ENCOUNTER — Ambulatory Visit
Admission: RE | Admit: 2017-03-26 | Discharge: 2017-03-26 | Disposition: A | Discharge status: Home / Self Care | Payer: Medicare Other | Source: Ambulatory Visit | Attending: Radiation Oncology | Admitting: Radiation Oncology

## 2017-03-26 ENCOUNTER — Encounter: Payer: Self-pay | Admitting: Radiation Oncology

## 2017-03-26 VITALS — BP 123/68 | HR 96 | Temp 98.5°F | Ht 66.0 in | Wt 309.6 lb

## 2017-03-26 DIAGNOSIS — Z7982 Long term (current) use of aspirin: Secondary | ICD-10-CM | POA: Diagnosis not present

## 2017-03-26 DIAGNOSIS — Z882 Allergy status to sulfonamides status: Secondary | ICD-10-CM | POA: Insufficient documentation

## 2017-03-26 DIAGNOSIS — Z881 Allergy status to other antibiotic agents status: Secondary | ICD-10-CM | POA: Diagnosis not present

## 2017-03-26 DIAGNOSIS — Z79899 Other long term (current) drug therapy: Secondary | ICD-10-CM | POA: Diagnosis not present

## 2017-03-26 DIAGNOSIS — Z170 Estrogen receptor positive status [ER+]: Secondary | ICD-10-CM | POA: Insufficient documentation

## 2017-03-26 DIAGNOSIS — C50412 Malignant neoplasm of upper-outer quadrant of left female breast: Secondary | ICD-10-CM

## 2017-03-26 DIAGNOSIS — C50411 Malignant neoplasm of upper-outer quadrant of right female breast: Secondary | ICD-10-CM

## 2017-03-26 NOTE — Progress Notes (Signed)
Daisy [name] is here for follow up after treatment to her left breast.  She denies having pain other than occasional sharp pains in her left breast.  She reports her energy level is improving.  She is taking Arimidex.  The skin on her left breast has hyperpigmentation with dry ppeeling areas on the side of her breast. She has a small scabbed area along her lumpectomy scar.  She denies having any drainage from this area.  BP 123/68 (BP Location: Right Wrist, Patient Position: Sitting)   Pulse 96   Temp 98.5 F (36.9 C) (Oral)   Ht 5\' 6"  (1.676 m)   Wt (!) 309 lb 9.6 oz (140.4 kg)   SpO2 97%   BMI 49.97 kg/m    Wt Readings from Last 3 Encounters:  03/26/17 (!) 309 lb 9.6 oz (140.4 kg)  02/27/17 (!) 318 lb 9.6 oz (144.5 kg)  12/25/16 (!) 317 lb 9.6 oz (144.1 kg)

## 2017-03-26 NOTE — Progress Notes (Signed)
Radiation Oncology         [phone] ________________________________  Name: Daisy [name] MRN: [id number]  Date: 03/26/2017  DOB: June 20, 1950  Follow-Up Visit Note  CC: Alvester Chou, NP  Magrinat, Virgie Dad, MD    ICD-10-CM   1. Malignant neoplasm of upper-outer quadrant of right breast in female, estrogen receptor positive ([place]) C50.411    Z17.0     Diagnosis:   Stage I (pT1c, pN0) Invasive Ductal Carcinoma of the Left Breast, ER positive, PR positive, Her2-neu negative.   Interval Since Last Radiation:  3 weeks  Narrative:  The patient returns today for routine follow-up. She reports that she is doing well overall. She states she is feeling better and her energy is returning to normal. She denies taking any pain medications or any issues with overt breast pain. She does complain of hot flashes that wake her at night.   On review of systems, she reports hot flashes. Pt denies cough.                                ALLERGIES:  is allergic to levaquin [levofloxacin] and sulfur.  Meds: Current Outpatient Prescriptions  Medication Sig Dispense Refill  . albuterol (PROAIR HFA) 108 (90 BASE) MCG/ACT inhaler Inhale 2 puffs into the lungs every 6 (six) hours as needed for wheezing or shortness of breath.    . ALPRAZolam (XANAX) 0.25 MG tablet Take 0.125-0.25 mg by mouth 2 (two) times daily as needed for anxiety.     Marland Kitchen anastrozole (ARIMIDEX) 1 MG tablet Take 1 tablet (1 mg total) by mouth daily. 90 tablet 4  . aspirin 81 MG chewable tablet Chew 81 mg by mouth daily.    . cilostazol (PLETAL) 100 MG tablet Take 100 mg by mouth 2 (two) times daily.    . diclofenac sodium (VOLTAREN) 1 % GEL APPLY 4GM 4 TIMES A DAY TO KNEES AND BACK FOR PAIN  5  . dicyclomine (BENTYL) 10 MG capsule Take 1 capsule (10 mg total) by mouth every 8 (eight) hours. 30 capsule 3  . Multiple Vitamins-Minerals (PRESERVISION AREDS 2 PO) Take 1 tablet by mouth daily.     . pantoprazole (PROTONIX) 20 MG tablet  Take 20 mg by mouth daily.    . rosuvastatin (CRESTOR) 10 MG tablet Take 10 mg by mouth at bedtime.    Sallye Lat (EYE DROPS ADVANCED RELIEF) 0.05-0.1-1-1 % SOLN Place 1 drop into both eyes 2 (two) times daily.     Marland Kitchen triamterene-hydrochlorothiazide (MAXZIDE-25) 37.5-25 MG tablet Take 1 tablet by mouth daily.   1  . ketoconazole (NIZORAL) 2 % cream Apply 1 application topically daily. (Patient not taking: Reported on 03/26/2017) 15 g 0  . naproxen sodium (ANAPROX) 220 MG tablet Take 220 mg by mouth 2 (two) times daily as needed (pain).    Marland Kitchen oxyCODONE (ROXICODONE) 5 MG immediate release tablet Take 1 tablet (5 mg total) by mouth every 4 (four) hours as needed for severe pain. (Patient not taking: Reported on 03/26/2017) 30 tablet 0  . venlafaxine XR (EFFEXOR-XR) 37.5 MG 24 hr capsule Take 1 capsule (37.5 mg total) by mouth daily with breakfast. (Patient not taking: Reported on 03/26/2017) 90 capsule 4   No current facility-administered medications for this encounter.     Physical Findings: The patient is in no acute distress. Patient is alert and oriented.  height is '5\' 6"'  (1.676 m) and weight is 309  lb 9.6 oz (140.4 kg) (abnormal). Her oral temperature is 98.5 F (36.9 C). Her blood pressure is 123/68 and her pulse is 96. Her oxygen saturation is 97%. .  No significant changes.  Lungs are clear to auscultation bilaterally. Heart has regular rate and rhythm. No palpable cervical, supraclavicular, or axillary adenopathy. Abdomen soft, non-tender, normal bowel sounds. Right breast is large and pendulous without mass or nipple discharge. Left breast is also large and pendulous, pt's skin has healed well. Pt has hyperpigmentation changes in the breast. No palpable mass, nipple discharge, or bleeding.     Lab Findings: Lab Results  Component Value Date   WBC 6.6 02/27/2017   HGB 15.0 02/27/2017   HCT 44.8 02/27/2017   MCV 94.4 02/27/2017   PLT 179 02/27/2017     Radiographic Findings: No results found.  Impression:  The patient is recovering from the effects of radiation.  No evidence of recurrence on clinical exam.   Plan:  Routine f/u in 46mo  ____________________________________   JBlair Promise PhD, MD     I,Jennifer Tensley,acting as a scribe for JGery Pray MD.,have documented all relevant documentation on the behalf of JGery Pray MD,as directed by  JGery Pray MD while in the presence of JGery Pray MD.

## 2017-04-09 ENCOUNTER — Ambulatory Visit: Payer: Self-pay | Admitting: Radiation Oncology

## 2017-06-29 ENCOUNTER — Telehealth: Payer: Self-pay | Admitting: Oncology

## 2017-06-29 NOTE — Telephone Encounter (Signed)
Scheduled appt per 1/14 sch msg - left voicemail for patient regarding appts.

## 2017-07-02 ENCOUNTER — Ambulatory Visit: Payer: Medicare Other | Admitting: Oncology

## 2017-07-02 ENCOUNTER — Other Ambulatory Visit: Payer: Medicare Other

## 2017-07-16 ENCOUNTER — Ambulatory Visit: Payer: Medicare Other | Admitting: Oncology

## 2017-07-16 ENCOUNTER — Telehealth: Payer: Self-pay | Admitting: Oncology

## 2017-07-16 ENCOUNTER — Other Ambulatory Visit: Payer: Medicare Other

## 2017-07-16 NOTE — Telephone Encounter (Signed)
Rescheduled appts per 1/31 sch msg - spoke with patient regarding appts per 1/31 los.

## 2017-07-27 NOTE — Progress Notes (Signed)
[facility]  [phone] [phone]     ID: Daisy [name] DOB: February 17, 1951  MR#: [id number]  EPP#:[id number]  Patient Care Team: Alvester Chou, NP as PCP - General (Nurse Practitioner) Rolm Bookbinder, MD as Consulting Physician (General Surgery) Magrinat, Virgie Dad, MD as Consulting Physician (Oncology) Gery Pray, MD as Consulting Physician (Radiation Oncology) Lorelle Gibbs, MD (Radiology) Armbruster, Carlota Raspberry, MD as Consulting Physician (Gastroenterology) Adrian Prows, MD as Consulting Physician (Cardiology) Earlie Server, MD as Consulting Physician (Orthopedic Surgery) Soijett A Jamul MD:  CHIEF COMPLAINT: Estrogen receptor positive breast cancer  CURRENT TREATMENT: Anastrozole   BREAST CANCER HISTORY: From the original intake note:  Maylie had routine screening mammography at [facility] 09/17/2016. The breast density was category A. At the 12:00 location anteriorly in the left breast there was an area of linear calcifications measuring 0.3 cm. There was also an area of architectural distortion in the left breast upper outer quadrant. On 09/23/2016 the patient underwent left diagnostic mammography and this confirmed an area of calcifications and architectural distortion. Accordingly on 09/24/2016 the patient underwent left breast ultrasonography which did not show any abnormality in the left breast or left axilla.  On 09/24/2016 the patient underwent biopsy of the left breast area of architectural distortion and this showed ([id number]) invasive ductal carcinoma, grade 1, estrogen receptor 100% positive, and progesterone receptor 100% positive, both with strong staining intensity, with an MIB-1 of 5%, and no HER-2 amplification, the signals ratio being 1.51 and the number per cell 2.65.  The patient's subsequent history is as detailed below.  INTERVAL HISTORY: Daisy [name] returns today for follow-up and treatment of her estrogen  receptor positive breast cancer and she is  accompanied by a friend today. She continues on anastrozole, with good tolerance. She has night sweats, but denies hot flashes. She also denies vaginal dryness. She pays $20 for a 3 month prescription.     REVIEW OF SYSTEMS: Daisy [name] reports that for exercise she will walk occasionally around the grocery store, although she does note that while in [place] she will ride the handicap accessible electric scooters. She reports that she now has the house to herself now that her daughter and her daughters partner moved out. She has BLE pain due to arthritis and she also has bone spurs to her bilateral heels. She denies unusual headaches, visual changes, nausea, vomiting, or dizziness. There has been no unusual cough, phlegm production, or pleurisy. This been no change in bowel or bladder habits. She denies unexplained fatigue or unexplained weight loss, bleeding, rash, or fever. A detailed review of systems was otherwise stable.       PAST MEDICAL HISTORY: Past Medical History:  Diagnosis Date  . Acid reflux   . Arthritis   . Cholecystitis   . Depression   . Dyspnea   . History of radiation therapy 01/20/17-03/02/17   left breast was treated to 50.4 Gy in 18 fractions  . Hyperlipemia   . Irregular heart rate   . Malignant neoplasm of upper-outer quadrant of left breast in female, estrogen receptor positive ([place]) 09/30/2016  . Obesity   . Peripheral vascular disease ([place])   . Pneumonia     PAST SURGICAL HISTORY: Past Surgical History:  Procedure Laterality Date  . ABDOMINAL HYSTERECTOMY    . BREAST LUMPECTOMY Left 11/19/2016   BREAST LUMPECTOMY WITH RADIOACTIVE SEED AND SENTINEL LYMPH NODE BIOPSY (Left)  . BREAST LUMPECTOMY WITH RADIOACTIVE SEED AND SENTINEL LYMPH NODE BIOPSY Left 11/19/2016  Procedure: BREAST LUMPECTOMY WITH RADIOACTIVE SEED AND SENTINEL LYMPH NODE BIOPSY;  Surgeon: Rolm Bookbinder, MD;  Location: [facility];  Service: General;   Laterality: Left;  . CESAREAN SECTION    . CESAREAN SECTION     x 2  . CHOLECYSTECTOMY  2007  . KNEE ARTHROSCOPY     bilateral  . KNEE SURGERY Bilateral   . LOWER EXTREMITY ANGIOGRAM N/A 04/04/2014   Procedure: LOWER EXTREMITY ANGIOGRAM;  Surgeon: Laverda Page, MD;  Location[facility] C[facility] LAB;  Service: Cardiovascular;  Laterality: N/A;  . OVARIAN CYST REMOVAL    . RE-EXCISION OF BREAST LUMPECTOMY Left 12/12/2016   Procedure: RE-EXCISION OF LEFT BREAST LUMPECTOMY;  Surgeon: Rolm Bookbinder, MD;  Location: [place];  Service: General;  Laterality: Left;  . ROTATOR CUFF REPAIR Right   . TONSILLECTOMY    . WRIST SURGERY Bilateral     FAMILY HISTORY Family History  Problem Relation Age of Onset  . Heart disease Mother   . Lung cancer Father   . Heart disease Father   . Heart disease Brother   The patient's father died at age 60 from lung cancer in the setting of tobacco abuse. The patient's mother died at the age of 40 from pneumonia. The patient has 3 brothers, 1 sister. There is no history of breast or ovarian cancer in the family.  GYNECOLOGIC HISTORY:  No LMP recorded. Patient has had a hysterectomy. Menarche age 89, first live birth age 51. The patient is GX P2. She underwent simple hysterectomy without salpingo-oophorectomy in 1986. She did not take hormone replacement. She did use oral contraceptives remotely for about 5 years, with no complications.  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 She now lives at home by herself along with her do Maurene Capes. Her daughter Daisy [name], who is a paramedic recently moved out. Daughter Daisy [name] lives in [place] and works as a Gaffer. The patient has no grandchildren. She is a Psychologist, forensic.    ADVANCED DIRECTIVES: Not in place. The patient was given the appropriate documents to complete and notarize at her discretion in course of her 10/01/2016 visit   HEALTH MAINTENANCE: Social History    Tobacco Use  . Smoking status: Current Every Day Smoker    Packs/day: 1.00    Years: 30.00    Pack years: 30.00    Types: E-cigarettes  . Smokeless tobacco: Never Used  Substance Use Topics  . Alcohol use: No  . Drug use: No     Colonoscopy:Never  PAP:  Bone density: Never   Allergies  Allergen Reactions  . Levaquin [Levofloxacin] Other (See Comments)    Body aches  . Sulfur Hives    Current Outpatient Medications  Medication Sig Dispense Refill  . albuterol (PROAIR HFA) 108 (90 BASE) MCG/ACT inhaler Inhale 2 puffs into the lungs every 6 (six) hours as needed for wheezing or shortness of breath.    . ALPRAZolam (XANAX) 0.25 MG tablet Take 0.125-0.25 mg by mouth 2 (two) times daily as needed for anxiety.     Marland Kitchen anastrozole (ARIMIDEX) 1 MG tablet Take 1 tablet (1 mg total) by mouth daily. 90 tablet 4  . aspirin 81 MG chewable tablet Chew 81 mg by mouth daily.    . cilostazol (PLETAL) 100 MG tablet Take 100 mg by mouth 2 (two) times daily.    . diclofenac sodium (VOLTAREN) 1 % GEL APPLY 4GM 4 TIMES A DAY TO KNEES AND BACK FOR PAIN  5  . dicyclomine (BENTYL) 10 MG capsule Take 1 capsule (10 mg total) by mouth every 8 (eight) hours. 30 capsule 3  . ketoconazole (NIZORAL) 2 % cream Apply 1 application topically daily. (Patient not taking: Reported on 03/26/2017) 15 g 0  . Multiple Vitamins-Minerals (PRESERVISION AREDS 2 PO) Take 1 tablet by mouth daily.     . naproxen sodium (ANAPROX) 220 MG tablet Take 220 mg by mouth 2 (two) times daily as needed (pain).    Marland Kitchen oxyCODONE (ROXICODONE) 5 MG immediate release tablet Take 1 tablet (5 mg total) by mouth every 4 (four) hours as needed for severe pain. (Patient not taking: Reported on 03/26/2017) 30 tablet 0  . pantoprazole (PROTONIX) 20 MG tablet Take 20 mg by mouth daily.    . rosuvastatin (CRESTOR) 10 MG tablet Take 10 mg by mouth at bedtime.    Sallye Lat (EYE DROPS ADVANCED RELIEF) 0.05-0.1-1-1 % SOLN Place 1  drop into both eyes 2 (two) times daily.     Marland Kitchen triamterene-hydrochlorothiazide (MAXZIDE-25) 37.5-25 MG tablet Take 1 tablet by mouth daily.   1  . venlafaxine XR (EFFEXOR-XR) 37.5 MG 24 hr capsule Take 1 capsule (37.5 mg total) by mouth daily with breakfast. (Patient not taking: Reported on 03/26/2017) 90 capsule 4   No current facility-administered medications for this visit.     OBJECTIVE: Morbidly obese white woman who appears stated age  7:   08/03/17 1008  BP: (!) 126/54  Pulse: 98  Resp: (!) 24  Temp: 98.4 F (36.9 C)  SpO2: 93%     Body mass index is 51.62 kg/m.    ECOG FS:2 - Symptomatic, <50% confined to bed  Sclerae unicteric, EOMs intact Oropharynx clear and moist No cervical or supraclavicular adenopathy Lungs no rales or rhonchi Heart regular rate and rhythm Abd soft, nontender, positive bowel sounds MSK no focal spinal tenderness, no upper extremity lymphedema Neuro: nonfocal, well oriented, appropriate affect Breasts: Right breast is benign.  The left breast is status post lumpectomy and radiation.  There is still some hyperpigmentation.  There is mild thickening of the skin.  There is no evidence of local recurrence.  Both axillae are benign.  LAB RESULTS:  CMP     Component Value Date/Time   NA 137 02/27/2017 1007   K 4.4 02/27/2017 1007   CL 97 (L) 11/17/2016 1306   CL 105 04/08/2014 0355   CO2 32 (H) 02/27/2017 1007   GLUCOSE 127 02/27/2017 1007   BUN 7.7 02/27/2017 1007   CREATININE 0.9 02/27/2017 1007   CALCIUM 10.2 02/27/2017 1007   PROT 7.0 02/27/2017 1007   ALBUMIN 3.3 (L) 02/27/2017 1007   AST 14 02/27/2017 1007   ALT 9 02/27/2017 1007   ALKPHOS 78 02/27/2017 1007   BILITOT 0.60 02/27/2017 1007   GFRNONAA >60 11/17/2016 1306   GFRNONAA 56 (L) 04/08/2014 0355   GFRAA >60 11/17/2016 1306   GFRAA >60 04/08/2014 0355    No results found for: TOTALPROTELP, ALBUMINELP, A1GS, A2GS, BETS, BETA2SER, GAMS, MSPIKE, SPEI  No results found  for: KPAFRELGTCHN, LAMBDASER, KAPLAMBRATIO  Lab Results  Component Value Date   WBC 6.3 08/03/2017   NEUTROABS 4.1 08/03/2017   HGB 14.8 08/03/2017   HCT 44.8 08/03/2017   MCV 94.0 08/03/2017   PLT 193 08/03/2017      Chemistry      Component Value Date/Time   NA 137 02/27/2017 1007   K 4.4 02/27/2017 1007   CL 97 (L) 11/17/2016 1306  CL 105 04/08/2014 0355   CO2 32 (H) 02/27/2017 1007   BUN 7.7 02/27/2017 1007   CREATININE 0.9 02/27/2017 1007      Component Value Date/Time   CALCIUM 10.2 02/27/2017 1007   ALKPHOS 78 02/27/2017 1007   AST 14 02/27/2017 1007   ALT 9 02/27/2017 1007   BILITOT 0.60 02/27/2017 1007       No results found for: LABCA2  No components found for: [id number]  No results for input(s): INR in the last 168 hours.  Urinalysis    Component Value Date/Time   COLORURINE YELLOW 03/13/2016 2145   APPEARANCEUR CLEAR 03/13/2016 2145   APPEARANCEUR Cloudy 04/07/2014 2005   LABSPEC 1.017 03/13/2016 2145   LABSPEC 1.006 04/07/2014 2005   PHURINE 6.0 03/13/2016 2145   GLUCOSEU NEGATIVE 03/13/2016 2145   GLUCOSEU Negative 04/07/2014 2005   HGBUR NEGATIVE 03/13/2016 2145   [place] NEGATIVE 03/13/2016 2145   BILIRUBINUR Negative 04/07/2014 2005   KETONESUR NEGATIVE 03/13/2016 2145   PROTEINUR NEGATIVE 03/13/2016 2145   NITRITE NEGATIVE 03/13/2016 2145   LEUKOCYTESUR NEGATIVE 03/13/2016 2145   LEUKOCYTESUR 3+ 04/07/2014 2005     STUDIES: Mammography at [facility] due April  ELIGIBLE FOR AVAILABLE RESEARCH PROTOCOL: no  ASSESSMENT: 67 y.o. [place] Parkins, [place] woman Status post left breast upper outer quadrant biopsy 09/24/2016 of an area of architectural distortion measuring 3.7 cm, showing invasive ductal carcinoma, grade 1, estrogen and progesterone receptor positive, HER-2 nonamplified, with an MIB-1 of 5%.  (a) a 0.3 cm area of linear calcifications in the superior aspect of the breast Was benign on biopsy 10/02/2016   (1) left lumpectomy and  sentinel lymph node sampling 11/19/2016 found ([id number]) invasive ductal carcinoma, measuring 1.9 cm, grade 2, with all 3 sentinel lymph nodes clear. However the medial margin was involved  (a) additional surgery 12/12/2016 cleared the involved margin (as [id number]  (2) Oncotype DX  score of 14 predicted a 10 year risk of outside the breast recurrence of 9% if the patient's only systemic therapy is tamoxifen for 5 years. It also predicted no benefit from chemotherapy.  (3) adjuvant radiation 01/20/2017 to 03/02/2017 Site/dose:   The Left breast was treated to 50.4 Gy in 18 fractions of 1.8 Gy.   (4) anastrozole started April 2018, continued through subsequent treatment.  (5) tobacco abuse: The patient has been strongly advised to discontinue smoking at this point  PLAN: Sheridyn will soon be a year out from definitive surgery for her breast cancer, with no evidence of disease recurrence.  This is favorable.  She is tolerating anastrozole well.  The plan will be to continue that for a total of 5 years.  Given her morbid obesity, I do not feel pressed to do a bone density.  I have strongly encouraged her to exercise more.  She does have silver sneakers and [facility]  She may consider starting water aerobics.  I also encouraged her to participate in the finding your new normal program here.  She will call with any other issues that may develop before her next visit.  Magrinat, Virgie Dad, MD  08/03/17 10:15 AM Medical Oncology and Hematology [facility] [address] [place], [place] [address] Tel. [phone]    Fax. [phone]    This document serves as a record of services personally performed by Lurline Del, MD. It was created on his behalf by Steva Colder, a trained medical scribe. The creation of  this record is based on the scribe's personal observations and the provider's statements to them.   I have reviewed  the above documentation for accuracy and completeness, and I agree with the above.

## 2017-08-03 ENCOUNTER — Telehealth: Payer: Self-pay | Admitting: Oncology

## 2017-08-03 ENCOUNTER — Inpatient Hospital Stay (HOSPITAL_BASED_OUTPATIENT_CLINIC_OR_DEPARTMENT_OTHER): Payer: Medicare Other | Admitting: Oncology

## 2017-08-03 ENCOUNTER — Inpatient Hospital Stay: Payer: Medicare Other | Attending: Internal Medicine

## 2017-08-03 VITALS — BP 126/54 | HR 98 | Temp 98.4°F | Resp 24 | Ht 66.0 in | Wt 319.8 lb

## 2017-08-03 DIAGNOSIS — Z170 Estrogen receptor positive status [ER+]: Secondary | ICD-10-CM

## 2017-08-03 DIAGNOSIS — F17290 Nicotine dependence, other tobacco product, uncomplicated: Secondary | ICD-10-CM

## 2017-08-03 DIAGNOSIS — Z79811 Long term (current) use of aromatase inhibitors: Secondary | ICD-10-CM | POA: Diagnosis not present

## 2017-08-03 DIAGNOSIS — C50412 Malignant neoplasm of upper-outer quadrant of left female breast: Secondary | ICD-10-CM

## 2017-08-03 DIAGNOSIS — C50411 Malignant neoplasm of upper-outer quadrant of right female breast: Secondary | ICD-10-CM

## 2017-08-03 DIAGNOSIS — E662 Morbid (severe) obesity with alveolar hypoventilation: Secondary | ICD-10-CM

## 2017-08-03 DIAGNOSIS — I739 Peripheral vascular disease, unspecified: Secondary | ICD-10-CM

## 2017-08-03 LAB — COMPREHENSIVE METABOLIC PANEL
ALT: 9 U/L (ref 0–55)
AST: 13 U/L (ref 5–34)
Albumin: 3.6 g/dL (ref 3.5–5.0)
Alkaline Phosphatase: 85 U/L (ref 40–150)
Anion gap: 10 (ref 3–11)
BUN: 17 mg/dL (ref 7–26)
CO2: 30 mmol/L — ABNORMAL HIGH (ref 22–29)
Calcium: 9.9 mg/dL (ref 8.4–10.4)
Chloride: 96 mmol/L — ABNORMAL LOW (ref 98–109)
Creatinine, Ser: 0.96 mg/dL (ref 0.60–1.10)
GFR calc Af Amer: >60 mL/min (ref >60)
GFR calc non Af Amer: >60 mL/min (ref >60)
Glucose, Bld: 98 mg/dL (ref 70–140)
POTASSIUM: 4.2 mmol/L (ref 3.5–5.1)
Sodium: 136 mmol/L (ref 136–145)
TOTAL PROTEIN: 7.1 g/dL (ref 6.4–8.3)
Total Bilirubin: 0.5 mg/dL (ref 0.2–1.2)

## 2017-08-03 LAB — CBC WITH DIFFERENTIAL/PLATELET
BASOS PCT: 1 %
Basophils Absolute: 0.1 10*3/uL (ref 0.0–0.1)
EOS ABS: 0.1 10*3/uL (ref 0.0–0.5)
EOS PCT: 2 %
HCT: 44.8 % (ref 34.8–46.6)
Hemoglobin: 14.8 g/dL (ref 11.6–15.9)
LYMPHS PCT: 24 %
Lymphs Abs: 1.5 10*3/uL (ref 0.9–3.3)
MCH: 31.1 pg (ref 25.1–34.0)
MCHC: 33.1 g/dL (ref 31.5–36.0)
MCV: 94 fL (ref 79.5–101.0)
Monocytes Absolute: 0.5 10*3/uL (ref 0.1–0.9)
Monocytes Relative: 8 %
Neutro Abs: 4.1 10*3/uL (ref 1.5–6.5)
Neutrophils Relative %: 65 %
PLATELETS: 193 10*3/uL (ref 145–400)
RBC: 4.77 MIL/uL (ref 3.70–5.45)
RDW: 14.3 % (ref 11.2–14.5)
WBC: 6.3 10*3/uL (ref 3.9–10.3)

## 2017-08-03 NOTE — Telephone Encounter (Signed)
Gave avs and calendar for October  °

## 2017-08-03 NOTE — Progress Notes (Signed)
Tried calling patient, unable to reach her.  I have sent her a letter asking her to contact our office so that we can schedule her a pre-visit appointment with endoscopy nurse and schedule her colonoscopy, which would have to be done at the hospital.

## 2017-09-21 ENCOUNTER — Other Ambulatory Visit: Payer: Self-pay

## 2017-09-21 ENCOUNTER — Encounter: Payer: Self-pay | Admitting: Radiation Oncology

## 2017-09-21 ENCOUNTER — Ambulatory Visit
Admission: RE | Admit: 2017-09-21 | Discharge: 2017-09-21 | Disposition: A | Discharge status: Home / Self Care | Payer: Medicare Other | Source: Ambulatory Visit | Attending: Radiation Oncology | Admitting: Radiation Oncology

## 2017-09-21 VITALS — BP 126/58 | HR 92 | Temp 98.1°F | Resp 20 | Wt 326.5 lb

## 2017-09-21 DIAGNOSIS — Z170 Estrogen receptor positive status [ER+]: Secondary | ICD-10-CM | POA: Diagnosis not present

## 2017-09-21 DIAGNOSIS — D0512 Intraductal carcinoma in situ of left breast: Secondary | ICD-10-CM | POA: Diagnosis not present

## 2017-09-21 DIAGNOSIS — Z79811 Long term (current) use of aromatase inhibitors: Secondary | ICD-10-CM | POA: Diagnosis not present

## 2017-09-21 DIAGNOSIS — Z79899 Other long term (current) drug therapy: Secondary | ICD-10-CM | POA: Insufficient documentation

## 2017-09-21 DIAGNOSIS — Z923 Personal history of irradiation: Secondary | ICD-10-CM | POA: Insufficient documentation

## 2017-09-21 DIAGNOSIS — R609 Edema, unspecified: Secondary | ICD-10-CM | POA: Insufficient documentation

## 2017-09-21 DIAGNOSIS — C50411 Malignant neoplasm of upper-outer quadrant of right female breast: Secondary | ICD-10-CM

## 2017-09-21 DIAGNOSIS — Z7982 Long term (current) use of aspirin: Secondary | ICD-10-CM | POA: Insufficient documentation

## 2017-09-21 DIAGNOSIS — R232 Flushing: Secondary | ICD-10-CM | POA: Insufficient documentation

## 2017-09-21 NOTE — Progress Notes (Signed)
Radiation Oncology         (336) [phone] ________________________________  Name: Daisy [name] MRN: [id number]  Date: 09/21/2017  DOB: 1951/04/30  Follow-Up Visit Note  CC: Alvester Chou, NP  Magrinat, Virgie Dad, MD    ICD-10-CM   1. Malignant neoplasm of upper-outer quadrant of right breast in female, estrogen receptor positive ([place]) C50.411    Z17.0     Diagnosis:   Stage I (pT1c, pN0) Invasive Ductal Carcinoma of the Left Breast, ER positive, PR positive, Her2-neu negative.   Interval Since Last Radiation:  7 months 01/20/17-03/02/17: 50.4 Gy to the left breast in 28 fractions  Narrative:  The patient returns today for routine follow-up. She reports peeling and hyperpigmentation to the breast area. She reports rare hot flashes. She is currently taking Anastrozole. She denies pain, lymphedema, or fatigue at this time.  ALLERGIES:  is allergic to levaquin [levofloxacin] and sulfur.  Meds: Current Outpatient Medications  Medication Sig Dispense Refill  . albuterol (PROAIR HFA) 108 (90 BASE) MCG/ACT inhaler Inhale 2 puffs into the lungs every 6 (six) hours as needed for wheezing or shortness of breath.    . anastrozole (ARIMIDEX) 1 MG tablet Take 1 tablet (1 mg total) by mouth daily. 90 tablet 4  . aspirin 81 MG chewable tablet Chew 81 mg by mouth daily.    . cilostazol (PLETAL) 100 MG tablet Take 100 mg by mouth 2 (two) times daily.    . Multiple Vitamins-Minerals (PRESERVISION AREDS 2 PO) Take 1 tablet by mouth daily.     . naproxen sodium (ANAPROX) 220 MG tablet Take 220 mg by mouth 2 (two) times daily as needed (pain).    . pantoprazole (PROTONIX) 20 MG tablet Take 20 mg by mouth daily.    . rosuvastatin (CRESTOR) 10 MG tablet Take 10 mg by mouth at bedtime.    . triamterene-hydrochlorothiazide (MAXZIDE-25) 37.5-25 MG tablet Take 1 tablet by mouth daily.   1  . ALPRAZolam (XANAX) 0.25 MG tablet Take 0.125-0.25 mg by mouth 2 (two) times daily as needed for anxiety.     .  diclofenac sodium (VOLTAREN) 1 % GEL APPLY 4GM 4 TIMES A DAY TO KNEES AND BACK FOR PAIN  5  . dicyclomine (BENTYL) 10 MG capsule Take 1 capsule (10 mg total) by mouth every 8 (eight) hours. (Patient not taking: Reported on 09/21/2017) 30 capsule 3  . ketoconazole (NIZORAL) 2 % cream Apply 1 application topically daily. (Patient not taking: Reported on 03/26/2017) 15 g 0  . oxyCODONE (ROXICODONE) 5 MG immediate release tablet Take 1 tablet (5 mg total) by mouth every 4 (four) hours as needed for severe pain. (Patient not taking: Reported on 03/26/2017) 30 tablet 0  . Tetrahydroz-Dextran-PEG-Povid (EYE DROPS ADVANCED RELIEF) 0.05-0.1-1-1 % SOLN Place 1 drop into both eyes 2 (two) times daily.     Marland Kitchen venlafaxine XR (EFFEXOR-XR) 37.5 MG 24 hr capsule Take 1 capsule (37.5 mg total) by mouth daily with breakfast. (Patient not taking: Reported on 09/21/2017) 90 capsule 4   No current facility-administered medications for this encounter.     Physical Findings: The patient is in no acute distress. Patient is alert and oriented.  weight is 326 lb 8 oz (148.1 kg) (abnormal). Her oral temperature is 98.1 F (36.7 C). Her blood pressure is 126/58 (abnormal) and her pulse is 92. Her respiration is 20 and oxygen saturation is 100%. . She presents in a wheelchair. No significant changes. Lungs are clear to auscultation bilaterally. Heart has  regular rate and rhythm. No palpable cervical, supraclavicular, or axillary adenopathy. Abdomen soft, non-tender, normal bowel sounds. Right breast is very large and pendulous without mass or nipple discharge. Left breast is also very large and pendulous. The patient continues to have some edema in her breast from the treatment as well as hyperpigmentation changes throughout the breast. Some induration noted at the lumpectomy sight but no palpable or visible signs of recurrence.     Lab Findings: Lab Results  Component Value Date   WBC 6.3 08/03/2017   HGB 14.8 08/03/2017   HCT  44.8 08/03/2017   MCV 94.0 08/03/2017   PLT 193 08/03/2017    Radiographic Findings: No results found.  Impression:  The patient is recovering from the effects of radiation.  No evidence of recurrence on clinical exam. Patient continues to have edema in her breast and we discussed possible physical therapy. The patient reports she is too busy taking care of her brother and does not wish to pursue this further.  Plan:  Follow-up in radiation oncology prn. The patient will remain under close follow-up with Dr. Jana Hakim and Dr. Donne Hazel. She will remain on Anastrozole. ____________________________________   Blair Promise, PhD, MD   This document serves as a record of services personally performed by Gery Pray, MD. It was created on his behalf by Bethann Humble, a trained medical scribe. The creation of this record is based on the scribe's personal observations and the provider's statements to them. This document has been checked and approved by the attending provider.

## 2017-09-21 NOTE — Progress Notes (Addendum)
Daisy [name] denies any pain or fatigue. Patient states that  she has some peeling to her breast area. States that her skin is still hyperpigmented. Patient is currently taking anastrozole. Vitals:   09/21/17 1142  BP: (!) 126/58  Pulse: 92  Resp: 20  Temp: 98.1 F (36.7 C)  TempSrc: Oral  SpO2: 100%  Weight: (!) 326 lb 8 oz (148.1 kg)   Wt Readings from Last 3 Encounters:  09/21/17 (!) 326 lb 8 oz (148.1 kg)  08/03/17 (!) 319 lb 12.8 oz (145.1 kg)  03/26/17 (!) 309 lb 9.6 oz (140.4 kg)

## 2017-09-24 ENCOUNTER — Ambulatory Visit: Payer: Self-pay | Admitting: Radiation Oncology

## 2018-01-01 ENCOUNTER — Other Ambulatory Visit: Payer: Self-pay | Admitting: Oncology

## 2018-03-25 ENCOUNTER — Telehealth: Payer: Self-pay | Admitting: Oncology

## 2018-03-25 NOTE — Telephone Encounter (Signed)
Returned pts call to cancel appts. Will call to r/s

## 2018-03-29 ENCOUNTER — Inpatient Hospital Stay: Payer: Medicare Other | Admitting: Oncology

## 2018-03-29 ENCOUNTER — Inpatient Hospital Stay: Payer: Medicare Other

## 2018-09-20 ENCOUNTER — Ambulatory Visit: Payer: Self-pay | Admitting: Cardiology

## 2018-10-08 ENCOUNTER — Other Ambulatory Visit: Payer: Self-pay | Admitting: Cardiology

## 2018-10-08 MED ORDER — TRIAMTERENE-HCTZ 37.5-25 MG PO TABS: 1.0000 | ORAL_TABLET | Freq: Every day | ORAL | 0 refills | Status: DC

## 2018-11-01 ENCOUNTER — Other Ambulatory Visit: Payer: Self-pay | Admitting: Cardiology

## 2018-11-01 NOTE — Telephone Encounter (Signed)
Please fill

## 2018-11-05 ENCOUNTER — Ambulatory Visit: Payer: Self-pay | Admitting: Cardiology

## 2018-11-30 ENCOUNTER — Ambulatory Visit (INDEPENDENT_AMBULATORY_CARE_PROVIDER_SITE_OTHER): Payer: Medicare Other | Admitting: Cardiology

## 2018-11-30 ENCOUNTER — Encounter: Payer: Self-pay | Admitting: Cardiology

## 2018-11-30 ENCOUNTER — Other Ambulatory Visit: Payer: Self-pay

## 2018-11-30 VITALS — Ht 67.0 in

## 2018-11-30 DIAGNOSIS — Z720 Tobacco use: Secondary | ICD-10-CM

## 2018-11-30 DIAGNOSIS — I739 Peripheral vascular disease, unspecified: Secondary | ICD-10-CM | POA: Diagnosis not present

## 2018-11-30 DIAGNOSIS — Z6841 Body Mass Index (BMI) 40.0 and over, adult: Secondary | ICD-10-CM | POA: Diagnosis not present

## 2018-11-30 NOTE — Progress Notes (Signed)
Virtual Visit via Video Note: This visit type was conducted due to national recommendations for restrictions regarding the COVID-19 Pandemic (e.g. social distancing).  This format is felt to be most appropriate for this patient at this time.  All issues noted in this document were discussed and addressed.  No physical exam was performed (except for noted visual exam findings with Telehealth visits).  The patient has consented to conduct a Telehealth visit and understands insurance will be billed.   I connected with@, on 11/30/18 at  by a video enabled telemedicine application and verified that I am speaking with the correct person using two identifiers.   I discussed the limitations of evaluation and management by telemedicine and the availability of in person appointments. The patient expressed understanding and agreed to proceed.   I have discussed with patient regarding the safety during COVID Pandemic and steps and precautions to be taken including social distancing, frequent hand wash and use of detergent soap, gels with the patient. I asked the patient to avoid touching mouth, nose, eyes, ears with the hands. I encouraged regular walking around the neighborhood and exercise and regular diet, as long as social distancing can be maintained.  Primary Physician/Referring:  Alvester Chou, NP  Patient ID: Daisy [name], female    DOB: Dec 27, 1950, 68 y.o.   MRN: [id number]  Chief Complaint  Patient presents with  . PAD  . Hyperlipidemia  . Follow-up    89yr  HPI: LLynnell Fiumara is a 68y.o. female  with peripheral arterial disease and claudication. She underwent peripheral arteriogram on 04/04/2014 and successful revascularization of chronically occluded left SFA, right lower extremity was not studied. Her past medical history significant for GERD, macular degeneration, morbid obesity, hyperlipidemia.  Diagnosed with breast cancer and underwent left upper quadrant lumpectomy 2  without periprocedural cardiac complications on 48/25/0037and is S/P RT. Continues to have exertional dyspnea which is stable and minimal leg cramps but mostly arthritis in knee. Unfortunately still smoking about 1/2 ppd of cigarettes. States she may have gained weight. She does not have her recent weight or her BP.   Past Medical History:  Diagnosis Date  . Acid reflux   . Arthritis   . Cholecystitis   . Depression   . Dyspnea   . History of radiation therapy 01/20/17-03/02/17   left breast was treated to 50.4 Gy in 18 fractions  . Hyperlipemia   . Irregular heart rate   . Malignant neoplasm of upper-outer quadrant of left breast in female, estrogen receptor positive ([place] 09/30/2016  . Obesity   . Peripheral vascular disease ([place]   . Pneumonia     Past Surgical History:  Procedure Laterality Date  . ABDOMINAL HYSTERECTOMY    . BREAST LUMPECTOMY Left 11/19/2016   BREAST LUMPECTOMY WITH RADIOACTIVE SEED AND SENTINEL LYMPH NODE BIOPSY (Left)  . BREAST LUMPECTOMY WITH RADIOACTIVE SEED AND SENTINEL LYMPH NODE BIOPSY Left 11/19/2016   Procedure: BREAST LUMPECTOMY WITH RADIOACTIVE SEED AND SENTINEL LYMPH NODE BIOPSY;  Surgeon: WRolm Bookbinder MD;  [place] [facility]  Service: General;  Laterality: Left;  . CESAREAN SECTION    . CESAREAN SECTION     x 2  . CHOLECYSTECTOMY  2007  . KNEE ARTHROSCOPY     bilateral  . KNEE SURGERY Bilateral   . LOWER EXTREMITY ANGIOGRAM N/A 04/04/2014   Procedure: LOWER EXTREMITY ANGIOGRAM;  Surgeon: JLaverda Page MD;  [facility];  Service: Cardiovascular;  Laterality: N/A;  .  OVARIAN CYST REMOVAL    . RE-EXCISION OF BREAST LUMPECTOMY Left 12/12/2016   Procedure: RE-EXCISION OF LEFT BREAST LUMPECTOMY;  Surgeon: Rolm Bookbinder, MD;  Location: [facility];  Service: General;  Laterality: Left;  . ROTATOR CUFF REPAIR Right   . TONSILLECTOMY    . WRIST SURGERY Bilateral     Social History   Socioeconomic History  . Marital status: Single     Spouse name: Not on file  . Number of children: 2  . Years of education: Not on file  . Highest education level: Not on file  Occupational History  . Not on file  Social Needs  . Financial resource strain: Not on file  . Food insecurity    Worry: Not on file    Inability: Not on file  . Transportation needs    Medical: Not on file    Non-medical: Not on file  Tobacco Use  . Smoking status: Current Every Day Smoker    Packs/day: 0.50    Years: 30.00    Pack years: 15.00    Types: E-cigarettes  . Smokeless tobacco: Never Used  Substance and Sexual Activity  . Alcohol use: No  . Drug use: No  . Sexual activity: Not on file  Lifestyle  . Physical activity    Days per week: Not on file    Minutes per session: Not on file  . Stress: Not on file  Relationships  . Social Herbalist on phone: Not on file    Gets together: Not on file    Attends religious service: Not on file    Active member of club or organization: Not on file    Attends meetings of clubs or organizations: Not on file    Relationship status: Not on file  . Intimate partner violence    Fear of current or ex partner: Not on file    Emotionally abused: Not on file    Physically abused: Not on file    Forced sexual activity: Not on file  Other Topics Concern  . Not on file  Social History Narrative  . Not on file   Review of Systems  Constitution: Negative for chills, decreased appetite, malaise/fatigue and weight gain.  Cardiovascular: Negative for dyspnea on exertion, leg swelling and syncope.  Endocrine: Negative for cold intolerance.  Hematologic/Lymphatic: Does not bruise/bleed easily.  Musculoskeletal: Positive for back pain and joint pain. Negative for joint swelling.  Gastrointestinal: Negative for abdominal pain, anorexia, change in bowel habit, hematochezia and melena.  Neurological: Negative for headaches and light-headedness.  Psychiatric/Behavioral: Positive for depression.  Negative for substance abuse. The patient is nervous/anxious.   All other systems reviewed and are negative.     Objective  Height '5\' 7"'  (1.702 m). Body mass index is 51.14 kg/m. Physical exam not performed or limited due to virtual visit.  Patient appeared to be in no distress, Neck was supple, respiration was not labored.  Please see exam details from prior visit is as below.    Physical Exam  Constitutional: She appears well-developed. No distress.  Morbidly obese  HENT:  Head: Atraumatic.  Eyes: Conjunctivae are normal.  Neck: Neck supple. No thyromegaly present.  Short neck and difficult to evaluate JVP  Cardiovascular: Normal rate, regular rhythm and normal heart sounds. Exam reveals no gallop.  No murmur heard. Pulses:      Carotid pulses are 2+ on the right side and 2+ on the left side.  Dorsalis pedis pulses are 0 on the right side and 0 on the left side.       Posterior tibial pulses are 1+ on the right side and 1+ on the left side.  Femoral and popliteal pulse difficult to feel due to patient's body habitus.  Trace edema  Pulmonary/Chest: Effort normal and breath sounds normal.  Abdominal: Soft. Bowel sounds are normal.  Obese. Pannus present  Musculoskeletal: Normal range of motion.        General: No edema.  Neurological: She is alert.  Skin: Skin is warm and dry.  Psychiatric: She has a normal mood and affect.   Radiology: No results found.  Laboratory examination:   05/27/2018: Glucose 109, creatinine 0.95, EGFR 62/72, sodium 133, potassium 4.2, CMP otherwise normal.  Cholesterol 266, triglycerides 146, HDL 58, LDL 179.  CMP Latest Ref Rng & Units 08/03/2017 02/27/2017 11/17/2016  Glucose 70 - 140 mg/dL 98 127 95  BUN 7 - 26 mg/dL 17 7.7 8  Creatinine 0.60 - 1.10 mg/dL 0.96 0.9 0.74  Sodium 136 - 145 mmol/L 136 137 136  Potassium 3.5 - 5.1 mmol/L 4.2 4.4 4.4  Chloride 98 - 109 mmol/L 96(L) - 97(L)  CO2 22 - 29 mmol/L 30(H) 32(H) 29  Calcium 8.4 - 10.4  mg/dL 9.9 10.2 9.2  Total Protein 6.4 - 8.3 g/dL 7.1 7.0 -  Total Bilirubin 0.2 - 1.2 mg/dL 0.5 0.60 -  Alkaline Phos 40 - 150 U/L 85 78 -  AST 5 - 34 U/L 13 14 -  ALT 0 - 55 U/L 9 9 -   CBC Latest Ref Rng & Units 08/03/2017 02/27/2017 12/12/2016  WBC 3.9 - 10.3 K/uL 6.3 6.6 8.1  Hemoglobin 11.6 - 15.9 g/dL 14.8 15.0 15.8(H)  Hematocrit 34.8 - 46.6 % 44.8 44.8 48.2(H)  Platelets 145 - 400 K/uL 193 179 213   Medications   Medications Discontinued During This Encounter  Medication Reason  . venlafaxine XR (EFFEXOR-XR) 37.5 MG 24 hr capsule No longer needed (for PRN medications)  . oxyCODONE (ROXICODONE) 5 MG immediate release tablet Completed Course  . dicyclomine (BENTYL) 10 MG capsule No longer needed (for PRN medications)  . ALPRAZolam (XANAX) 0.25 MG tablet Completed Course   Current Meds  Medication Sig  . albuterol (PROAIR HFA) 108 (90 BASE) MCG/ACT inhaler Inhale 2 puffs into the lungs every 6 (six) hours as needed for wheezing or shortness of breath.  . anastrozole (ARIMIDEX) 1 MG tablet TAKE 1 TABLET BY MOUTH DAILY  . aspirin 81 MG chewable tablet Chew 81 mg by mouth daily.  . cilostazol (PLETAL) 100 MG tablet Take 100 mg by mouth 2 (two) times daily.  . diclofenac sodium (VOLTAREN) 1 % GEL APPLY 4GM 4 TIMES A DAY TO KNEES AND BACK FOR PAIN  . ketoconazole (NIZORAL) 2 % cream Apply 1 application topically daily.  . Multiple Vitamins-Minerals (PRESERVISION AREDS 2 PO) Take 1 tablet by mouth daily.   . naproxen sodium (ANAPROX) 220 MG tablet Take 220 mg by mouth 2 (two) times daily as needed (pain).  . pantoprazole (PROTONIX) 20 MG tablet Take 20 mg by mouth daily.  . rosuvastatin (CRESTOR) 10 MG tablet Take 10 mg by mouth at bedtime.  Sallye Lat (EYE DROPS ADVANCED RELIEF) 0.05-0.1-1-1 % SOLN Place 1 drop into both eyes 2 (two) times daily.   Marland Kitchen triamterene-hydrochlorothiazide (MAXZIDE-25) 37.5-25 MG tablet TAKE 1 TABLET BY MOUTH EVERY DAY    Cardiac  Studies:   Echocardiogram 10/14/2016: Poor echo  window. Wall motion abnormality has reduced sensitivity. Left ventricle cavity is normal in size. Moderate concentric hypertrophy of the left ventricle. Normal global wall motion. Visual EF is 50-55%. Doppler evidence of grade II (pseudonormal) diastolic dysfunction, elevated LAP. Left atrial cavity is mildly dilated at 4.1 cm. Trace tricuspid regurgitation. Unable to estimate PA pressure due to absence/minimal TR signal.  Peripheral arteriogram 04/04/2014: Stenting left SFA 6.0 x 150 mm Smartflex self-expanding stent, 100% to 0%. Three-vessel runoff below left knee. Right leg not studied.  Lower extremity arterial duplex 07/17/2015: No hemodynamically significant stenoses are identified in both the lower extremity arterial system. Severe dampened biphasic waveforms suggest diffuse disease. This exam reveals moderately decreased perfusion of the lower extremity bilaterally. LABI 0.68 and RABI 0.61. Consider further work-up if clinically indicated.  Assessment   1. Claudication in peripheral vascular disease ([place])   2. Class 3 severe obesity due to excess calories with serious comorbidity and body mass index (BMI) of 45.0 to 49.9 in adult ([place])   3. Tobacco use    EKG 09/14/2017: Sinus tachycardia at the rate of 103 bpm, borderline right ear for left atrial enlargement, normal axis. However progression, cannot exclude anteroseptal infarct old. Low-voltage complexes. No evidence of ischemia. No significant change from EKG 04/23/2016.  Recommendations:   Patient with very minimal symptoms of claudication, Mostly her issues are related to arthritis and obesity.  I have stressed to her regarding abstinence from tobacco.   Her lipids are being managed by her PCP and previously LDL was elevated and she was intolerant to higher dose statins. If LDL is elevated > 100 mg, could consider PCSK9 inhibitor. I will see her back in a year.   Adrian Prows, MD,  [facility] 11/30/2018, 5:36 PM [facility] [place] Pager: [phone] Office: [phone] If no answer Cell [phone]

## 2018-12-06 ENCOUNTER — Other Ambulatory Visit: Payer: Self-pay | Admitting: Cardiology

## 2018-12-06 NOTE — Telephone Encounter (Signed)
Please fill

## 2019-01-04 ENCOUNTER — Other Ambulatory Visit: Payer: Self-pay | Admitting: Oncology

## 2019-03-08 ENCOUNTER — Other Ambulatory Visit: Payer: Self-pay

## 2019-03-08 MED ORDER — CILOSTAZOL 100 MG PO TABS: 100.0000 mg | ORAL_TABLET | Freq: Two times a day (BID) | ORAL | 3 refills | Status: DC

## 2019-07-07 ENCOUNTER — Other Ambulatory Visit: Payer: Self-pay | Admitting: Adult Health

## 2019-07-08 ENCOUNTER — Telehealth: Payer: Self-pay | Admitting: Oncology

## 2019-07-08 NOTE — Telephone Encounter (Signed)
Scheduled per 1/21 sch msg. Called and left msg. Mailing printout 

## 2019-07-18 ENCOUNTER — Other Ambulatory Visit: Payer: Self-pay | Admitting: Hematology and Oncology

## 2019-08-12 ENCOUNTER — Ambulatory Visit: Payer: Medicare PPO | Attending: Internal Medicine

## 2019-08-12 DIAGNOSIS — Z23 Encounter for immunization: Secondary | ICD-10-CM

## 2019-08-12 NOTE — Progress Notes (Signed)
   Covid-19 Vaccination Clinic  Name:  Jenelle Drennon    MRN: [id number] DOB: 09/14/1950  08/12/2019  Ms. Nies was observed post Covid-19 immunization for 15 minutes without incidence. She was provided with Vaccine Information Sheet and instruction to access the V-Safe system.   Ms. Sigala was instructed to call 911 with any severe reactions post vaccine: Marland Kitchen Difficulty breathing  . Swelling of your face and throat  . A fast heartbeat  . A bad rash all over your body  . Dizziness and weakness    Immunizations Administered    Name Date Dose VIS Date Route   Pfizer COVID-19 Vaccine 08/12/2019 11:38 AM 0.3 mL 05/27/2019 Intramuscular   Manufacturer: [place]   Lot: [id number]   [place]: [id number]

## 2019-08-30 NOTE — Progress Notes (Signed)
[place]  [phone] [phone]     ID: Daisy [name] DOB: 03-02-1951  MR#: [id number]  ZRA#:[id number]  Patient Care Team: Alvester Chou, NP as PCP - General (Nurse Practitioner) Rolm Bookbinder, MD as Consulting Physician (General Surgery) Alban Marucci, Virgie Dad, MD as Consulting Physician (Oncology) Gery Pray, MD as Consulting Physician (Radiation Oncology) Lorelle Gibbs, MD (Radiology) Armbruster, Carlota Raspberry, MD as Consulting Physician (Gastroenterology) Adrian Prows, MD as Consulting Physician (Cardiology) Earlie Server, MD as Consulting Physician (Orthopedic Surgery) Chauncey Cruel, MD OTHER MD:  CHIEF COMPLAINT: Estrogen receptor positive breast cancer  CURRENT TREATMENT: Anastrozole   INTERVAL HISTORY: Daisy [name] returns today for follow-up of her estrogen receptor positive breast cancer accompanied by her daughter [name].. She was last seen here on 08/03/2017.  [place] has been pretty much housebound in the last year because of the vaccine.  She finally got out to get her first [place] shot and she will receive the second shot a week from today.  She continues on anastrozole, with good tolerance.  Currently she is spending less than $20 for a 1-monthsupply.  She does not have problems with hot flashes or vaginal dryness issues.  She does have some urethral irritation and some urinary tract problems which have been taking care of with a couple of rounds of antibiotics through her primary care physician  Since her last visit, she does not appear to have had further mammographic studies ([facility]  REVIEW OF SYSTEMS: Daisy [name] not all that mobile at home and she is not exercising regularly.  She does have a dog but he runs around the fast and backyard.  She does not walk him.  She watches TV and does a lot of Internet surfing.  A detailed review of systems today was  otherwise stable.   BREAST CANCER HISTORY: From the original intake note:  [facility] The breast density was category A. At the 12:00 location anteriorly in the left breast there was an area of linear calcifications measuring 0.3 cm. There was also an area of architectural distortion in the left breast upper outer quadrant. On 09/23/2016 the patient underwent left diagnostic mammography and this confirmed an area of calcifications and architectural distortion. Accordingly on 09/24/2016 the patient underwent left breast ultrasonography which did not show any abnormality in the left breast or left axilla.  On 09/24/2016 the patient underwent biopsy of the left breast area of architectural distortion and this showed ([id number]) invasive ductal carcinoma, grade 1, estrogen receptor 100% positive, and progesterone receptor 100% positive, both with strong staining intensity, with an MIB-1 of 5%, and no HER-2 amplification, the signals ratio being 1.51 and the number per cell 2.65.  The patient's subsequent history is as detailed below.   PAST MEDICAL HISTORY: Past Medical History:  Diagnosis Date  . Acid reflux   . Arthritis   . Cholecystitis   . Depression   . Dyspnea   . History of radiation therapy 01/20/17-03/02/17   left breast was treated to 50.4 Gy in 18 fractions  . Hyperlipemia   . Irregular heart rate   . Malignant neoplasm of upper-outer quadrant of left breast in [place], [place] 09/[place]  . Obesity   . Peripheral vascular disease ([place]   . Pneumonia     PAST SURGICAL HISTORY: Past Surgical History:  Procedure Laterality Date  . ABDOMINAL  HYSTERECTOMY    . BREAST LUMPECTOMY Left 11/19/2016   BREAST LUMPECTOMY WITH RADIOACTIVE SEED AND SENTINEL LYMPH NODE BIOPSY (Left)  . BREAST LUMPECTOMY WITH RADIOACTIVE SEED AND SENTINEL LYMPH NODE BIOPSY Left 11/19/2016   Procedure: BREAST LUMPECTOMY WITH  RADIOACTIVE SEED AND SENTINEL LYMPH NODE BIOPSY;  Surgeon: Rolm Bookbinder, MD;  Location: [place];  Service: General;  Laterality: Left;  . CESAREAN SECTION    . CESAREAN SECTION     x 2  . CHOLECYSTECTOMY  2007  . KNEE ARTHROSCOPY     bilateral  . KNEE SURGERY Bilateral   . LOWER EXTREMITY ANGIOGRAM N/A 04/04/2014   Procedure: LOWER EXTREMITY ANGIOGRAM;  Surgeon: Laverda Page, MD;  Location[facility] C[facility] LAB;  Service: Cardiovascular;  Laterality: N/A;  . OVARIAN CYST REMOVAL    . RE-EXCISION OF BREAST LUMPECTOMY Left 12/12/2016   Procedure: RE-EXCISION OF LEFT BREAST LUMPECTOMY;  Surgeon: Rolm Bookbinder, MD;  Location: [place];  Service: General;  Laterality: Left;  . ROTATOR CUFF REPAIR Right   . TONSILLECTOMY    . WRIST SURGERY Bilateral     FAMILY HISTORY Family History  Problem Relation Age of Onset  . Heart disease Mother   . Lung cancer Father   . Heart disease Father   . Heart disease Brother   The patient's father died at age 88 from lung cancer in the setting of tobacco abuse. The patient's mother died at the age of 73 from pneumonia. The patient has 3 brothers, 1 sister. There is no history of breast or ovarian cancer in the family.   GYNECOLOGIC HISTORY:  No LMP recorded. Patient has had a hysterectomy. Menarche age 82, first live birth age 46. The patient is GX P2. She underwent simple hysterectomy without salpingo-oophorectomy in 1986. She did not take hormone replacement. She did use oral contraceptives remotely for about 5 years, with no complications.   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 She now lives at home by herself along with her dog Daisy [name]. Her daughter Daisy [name] worked as a Audiological scientist for 30 years but retired 2020 and now works for [employer] in [place]. Daughter Daisy [name] lives in [place] and works as a Gaffer. The patient has no grandchildren. She is a Psychologist, forensic.    ADVANCED DIRECTIVES: Not in  place. The patient was given the appropriate documents to complete and notarize at her discretion in course of her 10/01/2016 visit   HEALTH MAINTENANCE: Social History   Tobacco Use  . Smoking status: Current Every Day Smoker    Packs/day: 0.50    Years: 30.00    Pack years: 15.00    Types: E-cigarettes  . Smokeless tobacco: Never Used  Substance Use Topics  . Alcohol use: No  . Drug use: No     Colonoscopy:Never  PAP:  Bone density: Never   Allergies  Allergen Reactions  . Levaquin [Levofloxacin] Other (See Comments)    Body aches  . Sulfur Hives    Current Outpatient Medications  Medication Sig Dispense Refill  . albuterol (PROAIR HFA) 108 (90 BASE) MCG/ACT inhaler Inhale 2 puffs into the lungs every 6 (six) hours as needed for wheezing or shortness of breath.    . anastrozole (ARIMIDEX) 1 MG tablet Take 1 tablet (1 mg total) by mouth daily. 90 tablet 4  . aspirin 81 MG chewable tablet Chew 81 mg by mouth daily.    . cilostazol (PLETAL) 100 MG tablet Take 1 tablet (  100 mg total) by mouth 2 (two) times daily. 180 tablet 3  . diclofenac sodium (VOLTAREN) 1 % GEL APPLY 4GM 4 TIMES A DAY TO KNEES AND BACK FOR PAIN  5  . ketoconazole (NIZORAL) 2 % cream Apply 1 application topically daily. 15 g 0  . Multiple Vitamins-Minerals (PRESERVISION AREDS 2 PO) Take 1 tablet by mouth daily.     . naproxen sodium (ANAPROX) 220 MG tablet Take 220 mg by mouth 2 (two) times daily as needed (pain).    . pantoprazole (PROTONIX) 20 MG tablet Take 20 mg by mouth daily.    . rosuvastatin (CRESTOR) 10 MG tablet Take 10 mg by mouth at bedtime.    Sallye Lat (EYE DROPS ADVANCED RELIEF) 0.05-0.1-1-1 % SOLN Place 1 drop into both eyes 2 (two) times daily.     Marland Kitchen triamterene-hydrochlorothiazide (MAXZIDE-25) 37.5-25 MG tablet TAKE 1 TABLET BY MOUTH EVERY DAY 90 tablet 3   No current facility-administered medications for this visit.    OBJECTIVE: Morbidly obese white woman  examined in a wheelchair  Vitals:   08/31/19 1155  BP: 123/63  Pulse: (!) 116  Resp: 18  Temp: 98.7 F (37.1 C)  SpO2: 98%     Body mass index is 50.67 kg/m.    ECOG FS:2 - Symptomatic, <50% confined to bed  Sclerae unicteric, EOMs intact Wearing a mask No cervical or supraclavicular adenopathy Lungs no rales or rhonchi Heart regular rate and rhythm Abd soft, nontender, positive bowel sounds MSK no focal spinal tenderness, no upper extremity lymphedema Neuro: nonfocal, well oriented, appropriate affect Breasts: The right breast is unremarkable.  The left breast has undergone lumpectomy and radiation.  There is a minimal dimpling at the site of the scar, but there is no tenderness erythema or swelling.  There is no evidence of local recurrence.  Both axillae are benign.   LAB RESULTS:  CMP     Component Value Date/Time   NA 136 08/03/2017 0942   NA 137 02/27/2017 1007   K 4.2 08/03/2017 0942   K 4.4 02/27/2017 1007   CL 96 (L) 08/03/2017 0942   CL 105 04/08/2014 0355   CO2 30 (H) 08/03/2017 0942   CO2 32 (H) 02/27/2017 1007   GLUCOSE 98 08/03/2017 0942   GLUCOSE 127 02/27/2017 1007   BUN 17 08/03/2017 0942   BUN 7.7 02/27/2017 1007   CREATININE 0.96 08/03/2017 0942   CREATININE 0.9 02/27/2017 1007   CALCIUM 9.9 08/03/2017 0942   CALCIUM 10.2 02/27/2017 1007   PROT 7.1 08/03/2017 0942   PROT 7.0 02/27/2017 1007   ALBUMIN 3.6 08/03/2017 0942   ALBUMIN 3.3 (L) 02/27/2017 1007   AST 13 08/03/2017 0942   AST 14 02/27/2017 1007   ALT 9 08/03/2017 0942   ALT 9 02/27/2017 1007   ALKPHOS 85 08/03/2017 0942   ALKPHOS 78 02/27/2017 1007   BILITOT 0.5 08/03/2017 0942   BILITOT 0.60 02/27/2017 1007   GFRNONAA >60 08/03/2017 0942   GFRNONAA 56 (L) 04/08/2014 0355   GFRAA >60 08/03/2017 0942   GFRAA >60 04/08/2014 0355    No results found for: TOTALPROTELP, ALBUMINELP, A1GS, A2GS, BETS, BETA2SER, GAMS, MSPIKE, SPEI  No results found for: KPAFRELGTCHN, LAMBDASER,  [facility]  Lab Results  Component Value Date   WBC 6.3 08/03/2017   NEUTROABS 4.1 08/03/2017   HGB 14.8 08/03/2017   HCT 44.8 08/03/2017   MCV 94.0 08/03/2017   PLT 193 08/03/2017      Chemistry  Component Value Date/Time   NA 136 08/03/2017 0942   NA 137 02/27/2017 1007   K 4.2 08/03/2017 0942   K 4.4 02/27/2017 1007   CL 96 (L) 08/03/2017 0942   CL 105 04/08/2014 0355   CO2 30 (H) 08/03/2017 0942   CO2 32 (H) 02/27/2017 1007   BUN 17 08/03/2017 0942   BUN 7.7 02/27/2017 1007   CREATININE 0.96 08/03/2017 0942   CREATININE 0.9 02/27/2017 1007      Component Value Date/Time   CALCIUM 9.9 08/03/2017 0942   CALCIUM 10.2 02/27/2017 1007   ALKPHOS 85 08/03/2017 0942   ALKPHOS 78 02/27/2017 1007   AST 13 08/03/2017 0942   AST 14 02/27/2017 1007   ALT 9 08/03/2017 0942   ALT 9 02/27/2017 1007   BILITOT 0.5 08/03/2017 0942   BILITOT 0.60 02/27/2017 1007     No results found for: LABCA2  No components found for: [id number]  No results for input(s): INR in the last 168 hours.  Urinalysis    Component Value Date/Time   COLORURINE YELLOW 03/13/2016 2145   APPEARANCEUR CLEAR 03/13/2016 2145   APPEARANCEUR Cloudy 04/07/2014 2005   LABSPEC 1.017 03/13/2016 2145   LABSPEC 1.006 04/07/2014 2005   PHURINE 6.0 03/13/2016 2145   GLUCOSEU NEGATIVE 03/13/2016 2145   GLUCOSEU Negative 04/07/2014 2005   HGBUR NEGATIVE 03/13/2016 2145   [place] NEGATIVE 03/13/2016 2145   BILIRUBINUR Negative 04/07/2014 2005   [place] NEGATIVE 03/13/2016 2145   PROTEINUR NEGATIVE 03/13/2016 2145   NITRITE NEGATIVE 03/13/2016 2145   LEUKOCYTESUR NEGATIVE 03/13/2016 2145   LEUKOCYTESUR 3+ 04/07/2014 2005    STUDIES: No results found.     ELIGIBLE FOR AVAILABLE RESEARCH PROTOCOL: no  ASSESSMENT: 68 y.o. [place],  woman Status post left breast upper outer quadrant biopsy 09/24/2016 of an area of architectural distortion measuring 3.7 cm, showing invasive ductal carcinoma,  grade 1, estrogen and progesterone receptor positive, HER-2 nonamplified, with an MIB-1 of 5%.  (a) a 0.3 cm area of linear calcifications in the superior aspect of the breast Was benign on biopsy 10/02/2016   (1) left lumpectomy and sentinel lymph node sampling 11/19/2016 found ([id number]) invasive ductal carcinoma, measuring 1.9 cm, grade 2, with all 3 sentinel lymph nodes clear. However the medial margin was involved  (a) additional surgery 12/12/2016 cleared the involved margin (as [id number]  (2) Oncotype DX  score of 14 predicted a 10 year risk of outside the breast recurrence of 9% if the patient's only systemic therapy is tamoxifen for 5 years. It also predicted no benefit from chemotherapy.  (3) adjuvant radiation 01/20/2017 to 03/02/2017 Site/dose:   The Left breast was treated to 50.4 Gy in 18 fractions of 1.8 Gy.   (4) anastrozole started April 2018  (5) tobacco abuse: The patient has been strongly advised to discontinue smoking at this point   PLAN: Daisy [name] is now just about 3 years out from definitive surgery for her breast cancer with no evidence of disease recurrence.  This is very favorable.  She is tolerating anastrozole well and the plan is to continue that a minimum of 5 years.  I have encouraged her to become a bit more active.  I commended her getting the vaccine as soon as she could obtain it.  She will be seeing Dr. Einar Gip in December.  She sees her primary care physician very variably.  I am tentatively going to put her down to see me in June after her May mammogram but if  she establishes herself with a primary care physician around that time I could certainly see her a year from now.  Total encounter time 25 minutes.*  Daisy [name], Virgie Dad, MD  08/31/19 12:51 PM Medical Oncology and Hematology [facility] [place], [place] [address] Tel. [phone]    Fax. [phone]    I, Wilburn Mylar, am acting as scribe for Dr. Virgie Dad.  Daisy [name].  I, Lurline Del MD, have reviewed the above documentation for accuracy and completeness, and I agree with the above.    *Total Encounter Time as defined by the Centers for Medicare and Medicaid Services includes, in addition to the face-to-face time of a patient visit (documented in the note above) non-face-to-face time: obtaining and reviewing outside history, ordering and reviewing medications, tests or procedures, care coordination (communications with other health care professionals or caregivers) and documentation in the medical record.

## 2019-08-31 ENCOUNTER — Inpatient Hospital Stay: Payer: Medicare PPO | Attending: Oncology | Admitting: Oncology

## 2019-08-31 ENCOUNTER — Other Ambulatory Visit: Payer: Self-pay

## 2019-08-31 VITALS — BP 123/63 | HR 116 | Temp 98.7°F | Resp 18 | Ht 67.0 in | Wt 323.5 lb

## 2019-08-31 DIAGNOSIS — C50412 Malignant neoplasm of upper-outer quadrant of left female breast: Secondary | ICD-10-CM | POA: Diagnosis not present

## 2019-08-31 DIAGNOSIS — K219 Gastro-esophageal reflux disease without esophagitis: Secondary | ICD-10-CM | POA: Diagnosis not present

## 2019-08-31 DIAGNOSIS — Z79811 Long term (current) use of aromatase inhibitors: Secondary | ICD-10-CM | POA: Diagnosis not present

## 2019-08-31 DIAGNOSIS — E669 Obesity, unspecified: Secondary | ICD-10-CM | POA: Diagnosis not present

## 2019-08-31 DIAGNOSIS — C50411 Malignant neoplasm of upper-outer quadrant of right female breast: Secondary | ICD-10-CM

## 2019-08-31 DIAGNOSIS — Z79899 Other long term (current) drug therapy: Secondary | ICD-10-CM | POA: Diagnosis not present

## 2019-08-31 DIAGNOSIS — F17210 Nicotine dependence, cigarettes, uncomplicated: Secondary | ICD-10-CM | POA: Diagnosis not present

## 2019-08-31 DIAGNOSIS — E662 Morbid (severe) obesity with alveolar hypoventilation: Secondary | ICD-10-CM | POA: Diagnosis not present

## 2019-08-31 DIAGNOSIS — Z170 Estrogen receptor positive status [ER+]: Secondary | ICD-10-CM

## 2019-08-31 DIAGNOSIS — I739 Peripheral vascular disease, unspecified: Secondary | ICD-10-CM

## 2019-08-31 MED ORDER — ANASTROZOLE 1 MG PO TABS: 1.0000 mg | ORAL_TABLET | Freq: Every day | ORAL | 4 refills | Status: DC

## 2019-09-07 ENCOUNTER — Ambulatory Visit: Payer: Medicare PPO | Attending: Internal Medicine

## 2019-09-07 DIAGNOSIS — Z23 Encounter for immunization: Secondary | ICD-10-CM

## 2019-09-07 NOTE — Progress Notes (Signed)
   Covid-19 Vaccination Clinic  Name:  Daisy [name]    MRN: [id number] DOB: Jul 17, 1950  09/07/2019  Daisy [name] was observed post Covid-19 immunization for 15 minutes without incident. She was provided with Vaccine Information Sheet and instruction to access the V-Safe system.   Daisy [name] was instructed to call 911 with any severe reactions post vaccine: Marland Kitchen Difficulty breathing  . Swelling of face and throat  . A fast heartbeat  . A bad rash all over body  . Dizziness and weakness   Immunizations Administered    Name Date Dose VIS Date Route   Pfizer COVID-19 Vaccine 09/07/2019  3:05 PM 0.3 mL 05/27/2019 Intramuscular   Manufacturer: [place]   Lot: [id number]   [place]: [id number]

## 2019-09-20 ENCOUNTER — Telehealth: Payer: Medicare PPO

## 2019-09-20 ENCOUNTER — Ambulatory Visit (INDEPENDENT_AMBULATORY_CARE_PROVIDER_SITE_OTHER)
Admission: RE | Admit: 2019-09-20 | Discharge: 2019-09-20 | Disposition: A | Discharge status: Home / Self Care | Payer: Medicare PPO | Source: Ambulatory Visit

## 2019-09-20 DIAGNOSIS — M5442 Lumbago with sciatica, left side: Secondary | ICD-10-CM | POA: Diagnosis not present

## 2019-09-20 MED ORDER — METHOCARBAMOL 500 MG PO TABS: 500.0000 mg | ORAL_TABLET | Freq: Two times a day (BID) | ORAL | 0 refills | Status: DC

## 2019-09-20 NOTE — ED Provider Notes (Signed)
Virtual Visit via Video Note:  Daisy [name]  initiated request for Telemedicine visit with [facility]gent [facility] team. I connected with Daisy [name]  on 09/20/2019 at 2:09 PM  for a synchronized telemedicine visit using a video enabled HIPPA compliant telemedicine application. I verified that I am speaking with Daisy [name]  using two identifiers. Sharion Balloon, NP  was physically located in a [facility] Urgent care site and Daisy [name] was located at a different location.   The limitations of evaluation and management by telemedicine as well as the availability of in-person appointments were discussed. Patient was informed that she  may incur a bill ( including co-pay) for this virtual visit encounter. Daisy [name]  expressed understanding and gave verbal consent to proceed with virtual visit.     History of Present Illness:Daisy [name]  is a 69 y.o. female presents for evaluation of left lower back pain x 3 days;  the pain radiates down her left anterior thigh to her knee.  No known injury; the pain started when she was getting in her daughter's car and twisted her back.  She denies numbness or weakness in her LE.  She denies loss of bowel/bladder control, fever, chills, or other symptoms.  She has been taking naproxen without relief.  Patient states she has a history of DDD.     Allergies  Allergen Reactions  . Levaquin [Levofloxacin] Other (See Comments)    Body aches  . Sulfur Hives     Past Medical History:  Diagnosis Date  . Acid reflux   . Arthritis   . Cholecystitis   . Depression   . Dyspnea   . History of radiation therapy 01/20/17-03/02/17   left breast was treated to 50.4 Gy in 18 fractions  . Hyperlipemia   . Irregular heart rate   . Malignant neoplasm of upper-outer quadrant of left breast in female, estrogen receptor positive ([place]) 09/30/2016  . Obesity   . Peripheral vascular disease ([place])   . Pneumonia      Social  History   Tobacco Use  . Smoking status: Current Every Day Smoker    Packs/day: 0.50    Years: 30.00    Pack years: 15.00    Types: E-cigarettes  . Smokeless tobacco: Never Used  Substance Use Topics  . Alcohol use: No  . Drug use: No   ROS: as stated in HPI.  All other systems reviewed and negative.      Observations/Objective: Physical Exam  VITALS: Patient denies fever. GENERAL: Alert, appears well and in no acute distress. HEENT: Atraumatic. NECK: Normal movements of the head and neck. CARDIOPULMONARY: No increased WOB. Speaking in clear sentences. I:E ratio WNL.  MS: Moves all visible extremities without noticeable abnormality. PSYCH: Pleasant and cooperative, well-groomed. Speech normal rate and rhythm. Affect is appropriate. Insight and judgement are appropriate. Attention is focused, linear, and appropriate.  NEURO: CN grossly intact. Oriented as arrived to appointment on time with no prompting. Moves both UE equally.  SKIN: No obvious lesions, wounds, erythema, or cyanosis noted on face or hands.   Assessment and Plan:    ICD-10-CM   1. Acute left-sided low back pain with left-sided sciatica  M54.42        Follow Up Instructions: Treating with Robaxin; precautions for drowsiness with this medication discussed with patient.  Instructed her to continue naproxen for discomfort.  Instructed her to f/u with her PCP if her symptoms are not improving.  Instructed her to go to the emergency department if she has worsening pain or develops new symptoms such as difficulty with urination, weakness, numbness, loss of control of bladder/ bowel, fever, chills or other concerns.  Patient agrees to plan of care.      I discussed the assessment and treatment plan with the patient. The patient was provided an opportunity to ask questions and all were answered. The patient agreed with the plan and demonstrated an understanding of the instructions.   The patient was advised to call  back or seek an in-person evaluation if the symptoms worsen or if the condition fails to improve as anticipated.      Sharion Balloon, NP  09/20/2019 2:09 PM         Sharion Balloon, NP 09/20/19 1409

## 2019-09-20 NOTE — Discharge Instructions (Addendum)
Continue to take the over-the-counter naproxen for discomfort.  Take the muscle relaxer Robaxin as needed for muscle spasm; do not drive, operate machinery, or drink alcohol with this medication as it may make you drowsy.    Follow up with your primary care provider if your pain is not improving.  Go to the emergency department if you have worsening pain or develop new symptoms such as difficulty with urination, weakness, numbness, loss of control of your bladder or bowels, fever, chills or other concerns.

## 2019-10-17 ENCOUNTER — Other Ambulatory Visit: Payer: Self-pay

## 2019-10-17 ENCOUNTER — Emergency Department (HOSPITAL_COMMUNITY): Payer: Medicare PPO

## 2019-10-17 ENCOUNTER — Inpatient Hospital Stay (HOSPITAL_COMMUNITY)
Admission: EM | Admit: 2019-10-17 | Discharge: 2019-11-02 | DRG: 291 | Disposition: A | Discharge status: Skilled Nursing Facility | Payer: Medicare PPO | Attending: Internal Medicine | Admitting: Internal Medicine

## 2019-10-17 ENCOUNTER — Encounter (HOSPITAL_COMMUNITY): Payer: Self-pay

## 2019-10-17 DIAGNOSIS — E861 Hypovolemia: Secondary | ICD-10-CM | POA: Diagnosis present

## 2019-10-17 DIAGNOSIS — I2781 Cor pulmonale (chronic): Secondary | ICD-10-CM | POA: Diagnosis present

## 2019-10-17 DIAGNOSIS — J9622 Acute and chronic respiratory failure with hypercapnia: Secondary | ICD-10-CM | POA: Diagnosis present

## 2019-10-17 DIAGNOSIS — I5031 Acute diastolic (congestive) heart failure: Secondary | ICD-10-CM | POA: Diagnosis present

## 2019-10-17 DIAGNOSIS — N189 Chronic kidney disease, unspecified: Secondary | ICD-10-CM

## 2019-10-17 DIAGNOSIS — E662 Morbid (severe) obesity with alveolar hypoventilation: Secondary | ICD-10-CM | POA: Diagnosis present

## 2019-10-17 DIAGNOSIS — N179 Acute kidney failure, unspecified: Secondary | ICD-10-CM

## 2019-10-17 DIAGNOSIS — J9621 Acute and chronic respiratory failure with hypoxia: Secondary | ICD-10-CM | POA: Diagnosis present

## 2019-10-17 DIAGNOSIS — E872 Acidosis: Secondary | ICD-10-CM | POA: Diagnosis present

## 2019-10-17 DIAGNOSIS — N390 Urinary tract infection, site not specified: Secondary | ICD-10-CM | POA: Diagnosis present

## 2019-10-17 DIAGNOSIS — K219 Gastro-esophageal reflux disease without esophagitis: Secondary | ICD-10-CM | POA: Diagnosis present

## 2019-10-17 DIAGNOSIS — J9601 Acute respiratory failure with hypoxia: Secondary | ICD-10-CM | POA: Diagnosis not present

## 2019-10-17 DIAGNOSIS — I509 Heart failure, unspecified: Secondary | ICD-10-CM

## 2019-10-17 DIAGNOSIS — C50411 Malignant neoplasm of upper-outer quadrant of right female breast: Secondary | ICD-10-CM | POA: Diagnosis not present

## 2019-10-17 DIAGNOSIS — M7989 Other specified soft tissue disorders: Secondary | ICD-10-CM | POA: Diagnosis present

## 2019-10-17 DIAGNOSIS — I739 Peripheral vascular disease, unspecified: Secondary | ICD-10-CM | POA: Diagnosis present

## 2019-10-17 DIAGNOSIS — I130 Hypertensive heart and chronic kidney disease with heart failure and stage 1 through stage 4 chronic kidney disease, or unspecified chronic kidney disease: Secondary | ICD-10-CM | POA: Diagnosis present

## 2019-10-17 DIAGNOSIS — F17290 Nicotine dependence, other tobacco product, uncomplicated: Secondary | ICD-10-CM | POA: Diagnosis present

## 2019-10-17 DIAGNOSIS — I2729 Other secondary pulmonary hypertension: Secondary | ICD-10-CM | POA: Diagnosis present

## 2019-10-17 DIAGNOSIS — H539 Unspecified visual disturbance: Secondary | ICD-10-CM | POA: Diagnosis present

## 2019-10-17 DIAGNOSIS — Z923 Personal history of irradiation: Secondary | ICD-10-CM

## 2019-10-17 DIAGNOSIS — Z882 Allergy status to sulfonamides status: Secondary | ICD-10-CM

## 2019-10-17 DIAGNOSIS — R319 Hematuria, unspecified: Secondary | ICD-10-CM | POA: Diagnosis present

## 2019-10-17 DIAGNOSIS — Z6841 Body Mass Index (BMI) 40.0 and over, adult: Secondary | ICD-10-CM

## 2019-10-17 DIAGNOSIS — I5021 Acute systolic (congestive) heart failure: Secondary | ICD-10-CM | POA: Diagnosis not present

## 2019-10-17 DIAGNOSIS — R739 Hyperglycemia, unspecified: Secondary | ICD-10-CM | POA: Diagnosis present

## 2019-10-17 DIAGNOSIS — F329 Major depressive disorder, single episode, unspecified: Secondary | ICD-10-CM | POA: Diagnosis present

## 2019-10-17 DIAGNOSIS — R4781 Slurred speech: Secondary | ICD-10-CM

## 2019-10-17 DIAGNOSIS — Z20822 Contact with and (suspected) exposure to covid-19: Secondary | ICD-10-CM | POA: Diagnosis present

## 2019-10-17 DIAGNOSIS — B9620 Unspecified Escherichia coli [E. coli] as the cause of diseases classified elsewhere: Secondary | ICD-10-CM | POA: Diagnosis present

## 2019-10-17 DIAGNOSIS — J189 Pneumonia, unspecified organism: Secondary | ICD-10-CM

## 2019-10-17 DIAGNOSIS — Z79899 Other long term (current) drug therapy: Secondary | ICD-10-CM

## 2019-10-17 DIAGNOSIS — Z8249 Family history of ischemic heart disease and other diseases of the circulatory system: Secondary | ICD-10-CM

## 2019-10-17 DIAGNOSIS — Z853 Personal history of malignant neoplasm of breast: Secondary | ICD-10-CM

## 2019-10-17 DIAGNOSIS — J9602 Acute respiratory failure with hypercapnia: Secondary | ICD-10-CM | POA: Diagnosis not present

## 2019-10-17 DIAGNOSIS — J9610 Chronic respiratory failure, unspecified whether with hypoxia or hypercapnia: Secondary | ICD-10-CM | POA: Diagnosis not present

## 2019-10-17 DIAGNOSIS — J810 Acute pulmonary edema: Secondary | ICD-10-CM | POA: Diagnosis not present

## 2019-10-17 DIAGNOSIS — E785 Hyperlipidemia, unspecified: Secondary | ICD-10-CM | POA: Diagnosis present

## 2019-10-17 DIAGNOSIS — E876 Hypokalemia: Secondary | ICD-10-CM | POA: Diagnosis not present

## 2019-10-17 DIAGNOSIS — Z7982 Long term (current) use of aspirin: Secondary | ICD-10-CM

## 2019-10-17 DIAGNOSIS — I959 Hypotension, unspecified: Secondary | ICD-10-CM | POA: Diagnosis not present

## 2019-10-17 DIAGNOSIS — N1832 Chronic kidney disease, stage 3b: Secondary | ICD-10-CM | POA: Diagnosis present

## 2019-10-17 DIAGNOSIS — I10 Essential (primary) hypertension: Secondary | ICD-10-CM | POA: Diagnosis not present

## 2019-10-17 DIAGNOSIS — G9341 Metabolic encephalopathy: Secondary | ICD-10-CM | POA: Diagnosis present

## 2019-10-17 DIAGNOSIS — E871 Hypo-osmolality and hyponatremia: Secondary | ICD-10-CM | POA: Diagnosis present

## 2019-10-17 DIAGNOSIS — R278 Other lack of coordination: Secondary | ICD-10-CM | POA: Diagnosis present

## 2019-10-17 DIAGNOSIS — R0603 Acute respiratory distress: Secondary | ICD-10-CM | POA: Diagnosis not present

## 2019-10-17 DIAGNOSIS — Z9049 Acquired absence of other specified parts of digestive tract: Secondary | ICD-10-CM

## 2019-10-17 DIAGNOSIS — I2609 Other pulmonary embolism with acute cor pulmonale: Secondary | ICD-10-CM | POA: Diagnosis not present

## 2019-10-17 DIAGNOSIS — G4733 Obstructive sleep apnea (adult) (pediatric): Secondary | ICD-10-CM | POA: Diagnosis not present

## 2019-10-17 DIAGNOSIS — Z888 Allergy status to other drugs, medicaments and biological substances status: Secondary | ICD-10-CM

## 2019-10-17 HISTORY — DX: Acute kidney failure, unspecified: N17.9

## 2019-10-17 HISTORY — DX: Heart failure, unspecified: I50.9

## 2019-10-17 HISTORY — DX: Chronic kidney disease, unspecified: N18.9

## 2019-10-17 LAB — CBC WITH DIFFERENTIAL/PLATELET
Abs Immature Granulocytes: 0.02 10*3/uL (ref 0.00–0.07)
Basophils Absolute: 0 10*3/uL (ref 0.0–0.1)
Basophils Relative: 0 %
Eosinophils Absolute: 0 10*3/uL (ref 0.0–0.5)
Eosinophils Relative: 0 %
HCT: 48.7 % — ABNORMAL HIGH (ref 36.0–46.0)
Hemoglobin: 15.1 g/dL — ABNORMAL HIGH (ref 12.0–15.0)
Immature Granulocytes: 0 %
Lymphocytes Relative: 13 %
Lymphs Abs: 1.1 10*3/uL (ref 0.7–4.0)
MCH: 30.8 pg (ref 26.0–34.0)
MCHC: 31 g/dL (ref 30.0–36.0)
MCV: 99.4 fL (ref 80.0–100.0)
Monocytes Absolute: 0.6 10*3/uL (ref 0.1–1.0)
Monocytes Relative: 8 %
Neutro Abs: 6.2 10*3/uL (ref 1.7–7.7)
Neutrophils Relative %: 79 %
Platelets: 232 10*3/uL (ref 150–400)
RBC: 4.9 MIL/uL (ref 3.87–5.11)
RDW: 15.3 % (ref 11.5–15.5)
WBC: 7.9 10*3/uL (ref 4.0–10.5)
nRBC: 0 % (ref 0.0–0.2)

## 2019-10-17 LAB — COMPREHENSIVE METABOLIC PANEL
ALT: 28 U/L (ref 0–44)
AST: 18 U/L (ref 15–41)
Albumin: 3.7 g/dL (ref 3.5–5.0)
Alkaline Phosphatase: 100 U/L (ref 38–126)
Anion gap: 9 (ref 5–15)
BUN: 27 mg/dL — ABNORMAL HIGH (ref 8–23)
CO2: 31 mmol/L (ref 22–32)
Calcium: 9.1 mg/dL (ref 8.9–10.3)
Chloride: 88 mmol/L — ABNORMAL LOW (ref 98–111)
Creatinine, Ser: 1.65 mg/dL — ABNORMAL HIGH (ref 0.44–1.00)
GFR calc Af Amer: 36 mL/min — ABNORMAL LOW (ref >60)
GFR calc non Af Amer: 31 mL/min — ABNORMAL LOW (ref >60)
Glucose, Bld: 117 mg/dL — ABNORMAL HIGH (ref 70–99)
Potassium: 4.5 mmol/L (ref 3.5–5.1)
Sodium: 128 mmol/L — ABNORMAL LOW (ref 135–145)
Total Bilirubin: 0.6 mg/dL (ref 0.3–1.2)
Total Protein: 7.7 g/dL (ref 6.5–8.1)

## 2019-10-17 LAB — URINALYSIS, ROUTINE W REFLEX MICROSCOPIC
Bilirubin Urine: NEGATIVE
Glucose, UA: NEGATIVE mg/dL
Ketones, ur: NEGATIVE mg/dL
Nitrite: POSITIVE — AB
Protein, ur: NEGATIVE mg/dL
Specific Gravity, Urine: 1.01 (ref 1.005–1.030)
pH: 6 (ref 5.0–8.0)

## 2019-10-17 LAB — RESPIRATORY PANEL BY RT PCR (FLU A&B, COVID)
Influenza A by PCR: NEGATIVE
Influenza B by PCR: NEGATIVE
SARS Coronavirus 2 by RT PCR: NEGATIVE

## 2019-10-17 LAB — SODIUM, URINE, RANDOM: Sodium, Ur: 13 mmol/L

## 2019-10-17 LAB — OSMOLALITY, URINE: Osmolality, Ur: 319 mOsm/kg (ref 300–900)

## 2019-10-17 LAB — POC SARS CORONAVIRUS 2 AG -  ED: SARS Coronavirus 2 Ag: NEGATIVE

## 2019-10-17 LAB — BRAIN NATRIURETIC PEPTIDE: B Natriuretic Peptide: 769.5 pg/mL — ABNORMAL HIGH (ref 0.0–100.0)

## 2019-10-17 LAB — OSMOLALITY: Osmolality: 283 mOsm/kg (ref 275–295)

## 2019-10-17 MED ORDER — SODIUM CHLORIDE 0.9% FLUSH
3.0000 mL | Freq: Two times a day (BID) | INTRAVENOUS | Status: DC
  Administered 2019-10-17 – 2019-11-02 (×32): 3 mL via INTRAVENOUS

## 2019-10-17 MED ORDER — ANASTROZOLE 1 MG PO TABS
1.0000 mg | ORAL_TABLET | Freq: Every day | ORAL | Status: DC
  Administered 2019-10-17 – 2019-11-02 (×17): 1 mg via ORAL
  Filled 2019-10-17 (×17): qty 1

## 2019-10-17 MED ORDER — NAPHAZOLINE-GLYCERIN 0.012-0.2 % OP SOLN
1.0000 [drp] | Freq: Four times a day (QID) | OPHTHALMIC | Status: DC | PRN
  Filled 2019-10-17: qty 15

## 2019-10-17 MED ORDER — ACETAMINOPHEN 325 MG PO TABS: 650.0000 mg | ORAL_TABLET | ORAL | Status: DC | PRN

## 2019-10-17 MED ORDER — METOPROLOL SUCCINATE ER 25 MG PO TB24
12.5000 mg | ORAL_TABLET | Freq: Every day | ORAL | Status: DC
  Administered 2019-10-17 – 2019-11-02 (×16): 12.5 mg via ORAL
  Filled 2019-10-17 (×6): qty 1
  Filled 2019-10-17: qty 0.5
  Filled 2019-10-17 (×10): qty 1

## 2019-10-17 MED ORDER — ENOXAPARIN SODIUM 40 MG/0.4ML ~~LOC~~ SOLN
40.0000 mg | SUBCUTANEOUS | Status: DC
  Administered 2019-10-17 – 2019-10-18 (×2): 40 mg via SUBCUTANEOUS
  Filled 2019-10-17 (×2): qty 0.4

## 2019-10-17 MED ORDER — SODIUM CHLORIDE 0.9 % IV SOLN: 250.0000 mL | INTRAVENOUS | Status: DC | PRN

## 2019-10-17 MED ORDER — SODIUM CHLORIDE 0.9% FLUSH: 3.0000 mL | INTRAVENOUS | Status: DC | PRN

## 2019-10-17 MED ORDER — NAPROXEN 250 MG PO TABS
250.0000 mg | ORAL_TABLET | Freq: Two times a day (BID) | ORAL | Status: DC | PRN
  Filled 2019-10-17: qty 1

## 2019-10-17 MED ORDER — PROSIGHT PO TABS
1.0000 | ORAL_TABLET | Freq: Every day | ORAL | Status: DC
  Administered 2019-10-18 – 2019-11-02 (×16): 1 via ORAL
  Filled 2019-10-17 (×16): qty 1

## 2019-10-17 MED ORDER — ALBUTEROL SULFATE HFA 108 (90 BASE) MCG/ACT IN AERS: 2.0000 | INHALATION_SPRAY | Freq: Four times a day (QID) | RESPIRATORY_TRACT | Status: DC | PRN

## 2019-10-17 MED ORDER — DICLOFENAC SODIUM 1 % TD GEL
4.0000 g | Freq: Four times a day (QID) | TRANSDERMAL | Status: DC | PRN
  Filled 2019-10-17: qty 100

## 2019-10-17 MED ORDER — CILOSTAZOL 100 MG PO TABS
100.0000 mg | ORAL_TABLET | Freq: Two times a day (BID) | ORAL | Status: DC
  Administered 2019-10-17 – 2019-11-02 (×32): 100 mg via ORAL
  Filled 2019-10-17 (×32): qty 1

## 2019-10-17 MED ORDER — SODIUM CHLORIDE 0.9 % IV SOLN
1.0000 g | Freq: Once | INTRAVENOUS | Status: AC
  Administered 2019-10-17: 1 g via INTRAVENOUS
  Filled 2019-10-17: qty 10

## 2019-10-17 MED ORDER — FUROSEMIDE 40 MG PO TABS
20.0000 mg | ORAL_TABLET | Freq: Once | ORAL | Status: AC
  Administered 2019-10-17: 15:00:00 20 mg via ORAL
  Filled 2019-10-17: qty 1

## 2019-10-17 MED ORDER — FUROSEMIDE 10 MG/ML IJ SOLN
40.0000 mg | Freq: Two times a day (BID) | INTRAMUSCULAR | Status: AC
  Administered 2019-10-17 – 2019-10-19 (×4): 40 mg via INTRAVENOUS
  Filled 2019-10-17 (×4): qty 4

## 2019-10-17 MED ORDER — CEPHALEXIN 500 MG PO CAPS
500.0000 mg | ORAL_CAPSULE | Freq: Two times a day (BID) | ORAL | Status: DC
  Administered 2019-10-18 – 2019-10-20 (×5): 500 mg via ORAL
  Filled 2019-10-17 (×6): qty 1

## 2019-10-17 MED ORDER — NAPROXEN SODIUM 220 MG PO TABS: 220.0000 mg | ORAL_TABLET | Freq: Two times a day (BID) | ORAL | Status: DC | PRN

## 2019-10-17 MED ORDER — ONDANSETRON HCL 4 MG/2ML IJ SOLN: 4.0000 mg | Freq: Four times a day (QID) | INTRAMUSCULAR | Status: DC | PRN

## 2019-10-17 MED ORDER — PRESERVISION AREDS 2 PO CHEW: CHEWABLE_TABLET | Freq: Every day | ORAL | Status: DC

## 2019-10-17 MED ORDER — SACUBITRIL-VALSARTAN 24-26 MG PO TABS
1.0000 | ORAL_TABLET | Freq: Two times a day (BID) | ORAL | Status: DC
  Administered 2019-10-17: 1 via ORAL
  Filled 2019-10-17 (×2): qty 1

## 2019-10-17 MED ORDER — PANTOPRAZOLE SODIUM 20 MG PO TBEC
20.0000 mg | DELAYED_RELEASE_TABLET | Freq: Every day | ORAL | Status: DC
  Administered 2019-10-18 – 2019-10-19 (×2): 20 mg via ORAL
  Filled 2019-10-17 (×4): qty 1

## 2019-10-17 NOTE — Plan of Care (Signed)
Initiated care plan 

## 2019-10-17 NOTE — ED Notes (Signed)
POC COv19 given to MD and RN

## 2019-10-17 NOTE — ED Provider Notes (Addendum)
[facility] DEPT Provider Note   CSN: [id number] Arrival date & time: 10/17/19  1046     History Chief Complaint  Patient presents with  . Shortness of Breath  . Weakness  . Leg Swelling    Daisy [name] is a 69 y.o. female.  HPI 69 year old female with a history of peripheral vascular disease, obesity, GERD, breast cancer presents to the ED for ongoing weakness and "not feeling well" over the last few weeks, worsening since Friday.  She says she fell approximately 2 to 3 weeks ago onto her knees, denies head injury or LOC.  Patient says that she has had a cough, has had very little appetite and a loss of taste, along with increasing shortness of breath, worse with ambulation.  She also notes an increase in swelling and cramping of her legs bilaterally, as well as cramping in her hands. She started to consume more salt which she initially thought would help her cramps but states that she recently learned that this is actually not helpful.  She denies any numbness or tingling in her upper or lower extremities.  She does not have a history of CHF or COPD.  EMS noted that on arrival that her sats on room air were 87%, she was placed on 3 L nasal cannula with an increase in saturation to 96%.  She states her breast cancer is currently in remission.  She does endorse smoking.  She denies any fevers, chills, body aches, chest pain, palpitations, dizziness,  abdominal pain, back pain, nausea, vomiting, dysuria, hematuria, diarrhea.  She states that she has not left her house in weeks and has had no known sick contacts.  She has had both Pfizer COVID-19 vaccinations, which she states have made her cramps worse.  She is followed by Dr. Einar Gip with cardiology.    Past Medical History:  Diagnosis Date  . Acid reflux   . Arthritis   . Cholecystitis   . Depression   . Dyspnea   . History of radiation therapy 01/20/17-03/02/17   left breast was treated to 50.4 Gy in 18  fractions  . Hyperlipemia   . Irregular heart rate   . Malignant neoplasm of upper-outer quadrant of left breast in female, estrogen receptor positive ([place]) 09/30/2016  . Obesity   . Peripheral vascular disease ([place])   . Pneumonia     Patient Active Problem List   Diagnosis Date Noted  . Acute heart failure ([place]) 10/17/2019  . History of breast cancer 10/17/2019  . AKI (acute kidney injury) ([place]) 10/17/2019  . Essential hypertension 10/17/2019  . Urinary tract infection with hematuria   . Breast cancer of upper-outer quadrant of right female breast ([place]) 11/19/2016  . Malignant neoplasm of upper-outer quadrant of left breast in female, estrogen receptor positive ([place]) 09/30/2016  . Claudication in peripheral vascular disease ([place]) 04/04/2014  . Obesity hypoventilation syndrome ([place]) 04/04/2014  . GERD (gastroesophageal reflux disease) 04/04/2014    Past Surgical History:  Procedure Laterality Date  . ABDOMINAL HYSTERECTOMY    . BREAST LUMPECTOMY Left 11/19/2016   BREAST LUMPECTOMY WITH RADIOACTIVE SEED AND SENTINEL LYMPH NODE BIOPSY (Left)  . BREAST LUMPECTOMY WITH RADIOACTIVE SEED AND SENTINEL LYMPH NODE BIOPSY Left 11/19/2016   Procedure: BREAST LUMPECTOMY WITH RADIOACTIVE SEED AND SENTINEL LYMPH NODE BIOPSY;  Surgeon: Rolm Bookbinder, MD;  Location: [facility];  Service: General;  Laterality: Left;  . CESAREAN SECTION    . CESAREAN SECTION     x 2  .  CHOLECYSTECTOMY  2007  . KNEE ARTHROSCOPY     bilateral  . KNEE SURGERY Bilateral   . LOWER EXTREMITY ANGIOGRAM N/A 04/04/2014   Procedure: LOWER EXTREMITY ANGIOGRAM;  Surgeon: Laverda Page, MD;  Location: [facility] CATH LAB;  Service: Cardiovascular;  Laterality: N/A;  . OVARIAN CYST REMOVAL    . RE-EXCISION OF BREAST LUMPECTOMY Left 12/12/2016   Procedure: RE-EXCISION OF LEFT BREAST LUMPECTOMY;  Surgeon: Rolm Bookbinder, MD;  Location: [facility];  Service: General;  Laterality: Left;  . ROTATOR CUFF REPAIR Right   . TONSILLECTOMY     . WRIST SURGERY Bilateral      OB History   No obstetric history on file.     Family History  Problem Relation Age of Onset  . Heart disease Mother   . Lung cancer Father   . Heart disease Father   . Heart disease Brother     Social History   Tobacco Use  . Smoking status: Current Every Day Smoker    Packs/day: 0.50    Years: 30.00    Pack years: 15.00    Types: E-cigarettes  . Smokeless tobacco: Never Used  Substance Use Topics  . Alcohol use: No  . Drug use: No    Home Medications Prior to Admission medications   Medication Sig Start Date End Date Taking? Authorizing Provider  albuterol (PROAIR HFA) 108 (90 BASE) MCG/ACT inhaler Inhale 2 puffs into the lungs every 6 (six) hours as needed for wheezing or shortness of breath.   Yes [provider]  anastrozole (ARIMIDEX) 1 MG tablet Take 1 tablet (1 mg total) by mouth daily. 08/31/19  Yes Magrinat, Virgie Dad, MD  aspirin 81 MG chewable tablet Chew 81 mg by mouth daily.   Yes [provider]  cilostazol (PLETAL) 100 MG tablet Take 1 tablet (100 mg total) by mouth 2 (two) times daily. 03/08/19  Yes Adrian Prows, MD  diclofenac sodium (VOLTAREN) 1 % GEL Apply 4 g topically 4 (four) times daily as needed (knee and back pain). Knees and back for pain 02/08/17  Yes [provider]  Multiple Vitamins-Minerals (PRESERVISION AREDS 2 PO) Take 1 tablet by mouth daily.    Yes [provider]  naproxen sodium (ANAPROX) 220 MG tablet Take 220 mg by mouth 2 (two) times daily as needed (pain).   Yes [provider]  pantoprazole (PROTONIX) 20 MG tablet Take 20 mg by mouth daily.   Yes [provider]  Tetrahydroz-Dextran-PEG-Povid (EYE DROPS ADVANCED RELIEF) 0.05-0.1-1-1 % SOLN Place 1 drop into both eyes 2 (two) times daily.    Yes [provider]  triamterene-hydrochlorothiazide (MAXZIDE-25) 37.5-25 MG tablet TAKE 1 TABLET BY MOUTH EVERY DAY Patient taking differently: Take 1  tablet by mouth daily.  12/06/18  Yes Adrian Prows, MD    Allergies    Levaquin [levofloxacin] and Sulfur  Review of Systems   Review of Systems  Constitutional: Positive for activity change, appetite change and fatigue. Negative for chills and fever.  HENT: Negative for ear pain and sore throat.   Eyes: Negative for pain and visual disturbance.  Respiratory: Positive for cough and shortness of breath.   Cardiovascular: Negative for chest pain and palpitations.  Gastrointestinal: Negative for abdominal pain, blood in stool, diarrhea, nausea and vomiting.  Genitourinary: Negative for dysuria, flank pain, frequency, hematuria, vaginal bleeding and vaginal discharge.  Musculoskeletal: Negative for arthralgias and back pain.  Skin: Negative for color change and rash.  Neurological: Positive for weakness. Negative  for dizziness, seizures, syncope, light-headedness and headaches.  Psychiatric/Behavioral: Negative for confusion.  All other systems reviewed and are negative.   Physical Exam Updated Vital Signs BP 135/81   Pulse 84   Temp 98.3 F (36.8 C) (Oral)   Resp (!) 26   SpO2 99%   Physical Exam Vitals and nursing note reviewed.  Constitutional:      General: She is not in acute distress.    Appearance: She is well-developed. She is obese. She is ill-appearing (Chronically ill-appearing).  HENT:     Head: Normocephalic and atraumatic.     Mouth/Throat:     Mouth: Mucous membranes are moist.     Pharynx: Oropharynx is clear.  Eyes:     Extraocular Movements: Extraocular movements intact.     Conjunctiva/sclera: Conjunctivae normal.     Pupils: Pupils are equal, round, and reactive to light.  Cardiovascular:     Rate and Rhythm: Normal rate and regular rhythm.     Heart sounds: Murmur present.  Pulmonary:     Effort: Pulmonary effort is normal. Tachypnea present. No respiratory distress.     Breath sounds: Decreased breath sounds and wheezing (Audible expiratory wheezes)  present. No rhonchi.  Chest:     Chest wall: No tenderness.  Abdominal:     Palpations: Abdomen is soft.     Tenderness: There is no abdominal tenderness. There is no guarding.  Musculoskeletal:     Cervical back: Normal range of motion and neck supple.     Right lower leg: No tenderness. Edema (1-2+) present.     Left lower leg: No tenderness. Edema (1-2+) present.     Comments: Bilateral  swelling but no erythema to lower extremities.  Negative Homans.  5/5 strength and sensations intact in upper and lower extremities.  Skin:    General: Skin is warm and dry.     Capillary Refill: Capillary refill takes less than 2 seconds.     Findings: No erythema or rash.  Neurological:     General: No focal deficit present.     Mental Status: She is alert.  Psychiatric:        Mood and Affect: Mood normal.        Behavior: Behavior normal.     ED Results / Procedures / Treatments   Labs (all labs ordered are listed, but only abnormal results are displayed) Labs Reviewed  CBC WITH DIFFERENTIAL/PLATELET - Abnormal; Notable for the following components:      Result Value   Hemoglobin 15.1 (*)    HCT 48.7 (*)    All other components within normal limits  COMPREHENSIVE METABOLIC PANEL - Abnormal; Notable for the following components:   Sodium 128 (*)    Chloride 88 (*)    Glucose, Bld 117 (*)    BUN 27 (*)    Creatinine, Ser 1.65 (*)    GFR calc non Af Amer 31 (*)    GFR calc Af Amer 36 (*)    All other components within normal limits  URINALYSIS, ROUTINE W REFLEX MICROSCOPIC - Abnormal; Notable for the following components:   APPearance CLOUDY (*)    Hgb urine dipstick SMALL (*)    Nitrite POSITIVE (*)    Leukocytes,Ua LARGE (*)    Bacteria, UA MANY (*)    All other components within normal limits  BRAIN NATRIURETIC PEPTIDE - Abnormal; Notable for the following components:   B Natriuretic Peptide 769.5 (*)    All other components within normal limits  RESPIRATORY PANEL BY RT PCR  (FLU A&B, COVID)  URINE CULTURE  HIV ANTIBODY (ROUTINE TESTING W REFLEX)  OSMOLALITY, URINE  OSMOLALITY  SODIUM, URINE, RANDOM  POC SARS CORONAVIRUS 2 AG -  ED    EKG None  Radiology DG Chest Portable 1 View  Result Date: 10/17/2019 CLINICAL DATA:  Shortness of breath. Generalized weakness. Bilateral leg swelling and numbness. EXAM: PORTABLE CHEST 1 VIEW COMPARISON:  11/17/2014. FINDINGS: Enlarged cardiac silhouette with an interval significant increase in size. Interval mild linear density in the left mid and lower lung zones. Progressively prominent interstitial markings with no Kerley lines or pleural fluid seen. Aortic arch calcifications. No acute bony abnormalities. IMPRESSION: 1. Interval cardiomegaly. 2. Interval mild chronic interstitial lung disease or minimal interstitial pulmonary edema. 3. Interval mild linear atelectasis or scarring in the left mid and lower lung zones. Electronically Signed   By: Claudie Revering M.D.   On: 10/17/2019 12:21    Procedures Procedures (including critical care time)  Medications Ordered in ED Medications  cefTRIAXone (ROCEPHIN) 1 g in sodium chloride 0.9 % 100 mL IVPB (has no administration in time range)  sodium chloride flush (NS) 0.9 % injection 3 mL (has no administration in time range)  sodium chloride flush (NS) 0.9 % injection 3 mL (has no administration in time range)  0.9 %  sodium chloride infusion (has no administration in time range)  acetaminophen (TYLENOL) tablet 650 mg (has no administration in time range)  ondansetron (ZOFRAN) injection 4 mg (has no administration in time range)  enoxaparin (LOVENOX) injection 40 mg (has no administration in time range)  sacubitril-valsartan (ENTRESTO) 24-26 mg per tablet (has no administration in time range)  metoprolol succinate (TOPROL-XL) 24 hr tablet 12.5 mg (has no administration in time range)  furosemide (LASIX) injection 40 mg (has no administration in time range)  albuterol (VENTOLIN  HFA) 108 (90 Base) MCG/ACT inhaler 2 puff (has no administration in time range)  anastrozole (ARIMIDEX) tablet 1 mg (has no administration in time range)  cilostazol (PLETAL) tablet 100 mg (has no administration in time range)  diclofenac sodium (VOLTAREN) 1 % transdermal gel 4 g (has no administration in time range)  PreserVision AREDS 2 CHEW (has no administration in time range)  naproxen sodium (ALEVE) tablet 220 mg (has no administration in time range)  pantoprazole (PROTONIX) EC tablet 20 mg (has no administration in time range)  naphazoline-glycerin (CLEAR EYES REDNESS) ophth solution 1-2 drop (has no administration in time range)  cephALEXin (KEFLEX) capsule 500 mg (has no administration in time range)  furosemide (LASIX) tablet 20 mg (20 mg Oral Given 10/17/19 1511)    ED Course  I have reviewed the triage vital signs and the nursing notes.  Pertinent labs & imaging results that were available during my care of the patient were reviewed by me and considered in my medical decision making (see chart for details).    MDM Rules/Calculators/A&P                     69 year old with increased weakness, shortness of breath x2 weeks On presentation, patient is  obese chronically ill-appearing but in no acute distress, mildly tachypneic but speaking in full sentences without increased work of breathing.  O2 sats on 3 L 96%, other vitals reassuring.  Physical exam positive for 1-2+ pitting edema in lower extremities bilaterally.  We will start with basic labs, chest x-ray, EKG.  The emergent differential diagnosis for shortness of breath includes, but is  not limited to, Pulmonary edema, bronchoconstriction, Pneumonia, Pulmonary embolism, Pneumotherax/ Hemothorax, Dysrythmia, ACS, new onset HF   2:16 PM: CMP with mild hyponatremia of 128, elevated glucose of 117.  Creatinine 1.65 and BUN 27 which are elevated from values 2 years ago which was 0.96. Decreased GFR. Possible AKI.  CBC without  leukocytosis, POCT Covid negative, urine culture pending.  BNP pending.  CXR: IMPRESSION:  1. Interval cardiomegaly.  2. Interval mild chronic interstitial lung disease or minimal  interstitial pulmonary edema.  3. Interval mild linear atelectasis or scarring in the left mid and  lower lung zones.  CXR with concern for cardiopulmonary.  EKG normal sinus rhythm with no QT.  No evidence of ST elevation T wave inversion.   2:20 PM nursing staff attempted to decrease patient to room air, sats 88%.  Increased back to 3 L and patient is at 98%.  Pending BNP result, will consider D-dimer/CT scan rule out PE; PERC score of 2.  Suspect need for admission given patient new increase in O2 requirements. 3:31 PM: BNP elevated at 769.  UA with evidence of UTI.  Will treat with IV Rocephin.  Suspect patient shortness of breath secondary to new onset heart failure.  Insulted hospital service, they will admit the patient for further management and work-up.  Patient started on 20 mg Lasix, caution given her worsening renal function. Discussed the care plan with patient and son who is at bedside, they voiced understanding and are agreeable to this.  Will defer further management to hospitalist group.     Patient was seen and evaluated by Dr. Alvino Chapel and he is agreeable to the above plan.  Final Clinical Impression(s) / ED Diagnoses Final diagnoses:  New onset of congestive heart failure (HCC)  Urinary tract infection with hematuria, site unspecified  CHF (congestive heart failure) [facility])    Rx / DC Orders ED Discharge Orders    None       Garald Balding, PA-C 10/17/19 1547    Garald Balding, PA-C 10/17/19 1623    Davonna Belling, MD 10/17/19 [phone]

## 2019-10-17 NOTE — ED Notes (Signed)
Per Sharyn Lull, PA O2 was turned off to see what pt O2 is on RA. Pt 88% RA resting in bed with no exertion. Pt currently back on 3L [place] sat 93% and increasing slowly. PA aware.

## 2019-10-17 NOTE — H&P (Signed)
Daisy [name] is an 69 y.o. female.   Chief Complaint: fatigue  HPI: 69 year old female presents to emergency department with increasing fatigue, shortness of breath.  Patient states her main problem has been cramping of the hands and feet, ongoing since Covid vaccination, worse with recent prescription of muscle relaxer.  Daughter is with her and clarifies that the main concern, though patient is being stoic, is dyspnea on exertion, increased fatigue, increased lower extremity edema.  ED course: Chest x-ray showed interval cardiomegaly and concern for pulmonary edema.  Decreased renal function and hyponatremia on CMP  Past Medical History:  Diagnosis Date  . Acid reflux   . Arthritis   . Cholecystitis   . Depression   . Dyspnea   . History of radiation therapy 01/20/17-03/02/17   left breast was treated to 50.4 Gy in 18 fractions  . Hyperlipemia   . Irregular heart rate   . Malignant neoplasm of upper-outer quadrant of left breast in female, estrogen receptor positive ([place]) 09/30/2016  . Obesity   . Peripheral vascular disease ([place])   . Pneumonia     Past Surgical History:  Procedure Laterality Date  . ABDOMINAL HYSTERECTOMY    . BREAST LUMPECTOMY Left 11/19/2016   BREAST LUMPECTOMY WITH RADIOACTIVE SEED AND SENTINEL LYMPH NODE BIOPSY (Left)  . BREAST LUMPECTOMY WITH RADIOACTIVE SEED AND SENTINEL LYMPH NODE BIOPSY Left 11/19/2016   Procedure: BREAST LUMPECTOMY WITH RADIOACTIVE SEED AND SENTINEL LYMPH NODE BIOPSY;  Surgeon: Rolm Bookbinder, MD;  Location: [facility];  Service: General;  Laterality: Left;  . CESAREAN SECTION    . CESAREAN SECTION     x 2  . CHOLECYSTECTOMY  2007  . KNEE ARTHROSCOPY     bilateral  . KNEE SURGERY Bilateral   . LOWER EXTREMITY ANGIOGRAM N/A 04/04/2014   Procedure: LOWER EXTREMITY ANGIOGRAM;  Surgeon: Laverda Page, MD;  Location: [facility] CATH LAB;  Service: Cardiovascular;  Laterality: N/A;  . OVARIAN CYST REMOVAL    . RE-EXCISION OF BREAST  LUMPECTOMY Left 12/12/2016   Procedure: RE-EXCISION OF LEFT BREAST LUMPECTOMY;  Surgeon: Rolm Bookbinder, MD;  Location: [facility];  Service: General;  Laterality: Left;  . ROTATOR CUFF REPAIR Right   . TONSILLECTOMY    . WRIST SURGERY Bilateral     Family History  Problem Relation Age of Onset  . Heart disease Mother   . Lung cancer Father   . Heart disease Father   . Heart disease Brother    Social History:  reports that she has been smoking e-cigarettes. She has a 15.00 pack-year smoking history. She has never used smokeless tobacco. She reports that she does not drink alcohol or use drugs.  Allergies:  Allergies  Allergen Reactions  . Levaquin [Levofloxacin] Other (See Comments)    Body aches  . Sulfur Hives    (Not in a hospital admission)   Results for orders placed or performed during the hospital encounter of 10/17/19 (from the past 48 hour(s))  POC SARS Coronavirus 2 Ag-ED - Nasal Swab (BD Veritor Kit)     Status: None   Collection Time: 10/17/19 12:46 PM  Result Value Ref Range   SARS Coronavirus 2 Ag NEGATIVE NEGATIVE    Comment: (NOTE) SARS-CoV-2 antigen NOT DETECTED.  Negative results are presumptive.  Negative results do not preclude SARS-CoV-2 infection and should not be used as the sole basis for treatment or other patient management decisions, including infection  control decisions, particularly in the presence of clinical signs and  symptoms  consistent with COVID-19, or in those who have been in contact with the virus.  Negative results must be combined with clinical observations, patient history, and epidemiological information. The expected result is Negative. Fact Sheet for Patients: PodPark.tn Fact Sheet for Healthcare Providers: GiftContent.is This test is not yet approved or cleared by the [place] FDA and  has been authorized for detection and/or diagnosis of SARS-CoV-2 by FDA under an  Emergency Use Authorization (EUA).  This EUA will remain in effect (meaning this test can be used) for the duration of  the COVID-19 de claration under Section 564(b)(1) of the Act, 21 U.S.C. section 360bbb-3(b)(1), unless the authorization is terminated or revoked sooner.   Respiratory Panel by RT PCR (Flu A&B, Covid) - Nasopharyngeal Swab     Status: None   Collection Time: 10/17/19 12:48 PM   Specimen: Nasopharyngeal Swab  Result Value Ref Range   SARS Coronavirus 2 by RT PCR NEGATIVE NEGATIVE    Comment: (NOTE) SARS-CoV-2 target nucleic acids are NOT DETECTED. The SARS-CoV-2 RNA is generally detectable in upper respiratoy specimens during the acute phase of infection. The lowest concentration of SARS-CoV-2 viral copies this assay can detect is 131 copies/mL. A negative result does not preclude SARS-Cov-2 infection and should not be used as the sole basis for treatment or other patient management decisions. A negative result may occur with  improper specimen collection/handling, submission of specimen other than nasopharyngeal swab, presence of viral mutation(s) within the areas targeted by this assay, and inadequate number of viral copies (<131 copies/mL). A negative result must be combined with clinical observations, patient history, and epidemiological information. The expected result is Negative. Fact Sheet for Patients:  PinkCheek.be Fact Sheet for Healthcare Providers:  GravelBags.it This test is not yet ap proved or cleared by the [place] FDA and  has been authorized for detection and/or diagnosis of SARS-CoV-2 by FDA under an Emergency Use Authorization (EUA). This EUA will remain  in effect (meaning this test can be used) for the duration of the COVID-19 declaration under Section 564(b)(1) of the Act, 21 U.S.C. section 360bbb-3(b)(1), unless the authorization is terminated or revoked sooner.    Influenza A  by PCR NEGATIVE NEGATIVE   Influenza B by PCR NEGATIVE NEGATIVE    Comment: (NOTE) The Xpert Xpress SARS-CoV-2/FLU/RSV assay is intended as an aid in  the diagnosis of influenza from Nasopharyngeal swab specimens and  should not be used as a sole basis for treatment. Nasal washings and  aspirates are unacceptable for Xpert Xpress SARS-CoV-2/FLU/RSV  testing. Fact Sheet for Patients: PinkCheek.be Fact Sheet for Healthcare Providers: GravelBags.it This test is not yet approved or cleared by the [place] FDA and  has been authorized for detection and/or diagnosis of SARS-CoV-2 by  FDA under an Emergency Use Authorization (EUA). This EUA will remain  in effect (meaning this test can be used) for the duration of the  Covid-19 declaration under Section 564(b)(1) of the Act, 21  U.S.C. section 360bbb-3(b)(1), unless the authorization is  terminated or revoked. Performed at [facility], [place] [address], [place],  [address]   CBC with Differential     Status: Abnormal   Collection Time: 10/17/19  1:29 PM  Result Value Ref Range   WBC 7.9 4.0 - 10.5 K/uL   RBC 4.90 3.87 - 5.11 MIL/uL   Hemoglobin 15.1 (H) 12.0 - 15.0 g/dL   HCT 48.7 (H) 36.0 - 46.0 %   MCV 99.4 80.0 - 100.0 fL  MCH 30.8 26.0 - 34.0 pg   MCHC 31.0 30.0 - 36.0 g/dL   RDW 15.3 11.5 - 15.5 %   Platelets 232 150 - 400 K/uL   nRBC 0.0 0.0 - 0.2 %   Neutrophils Relative % 79 %   Neutro Abs 6.2 1.7 - 7.7 K/uL   Lymphocytes Relative 13 %   Lymphs Abs 1.1 0.7 - 4.0 K/uL   Monocytes Relative 8 %   Monocytes Absolute 0.6 0.1 - 1.0 K/uL   Eosinophils Relative 0 %   Eosinophils Absolute 0.0 0.0 - 0.5 K/uL   Basophils Relative 0 %   Basophils Absolute 0.0 0.0 - 0.1 K/uL   Immature Granulocytes 0 %   Abs Immature Granulocytes 0.02 0.00 - 0.07 K/uL    Comment: Performed at [facility], [place] [address], [place], [place]  [address]  Comprehensive metabolic panel     Status: Abnormal   Collection Time: 10/17/19  1:29 PM  Result Value Ref Range   Sodium 128 (L) 135 - 145 mmol/L   Potassium 4.5 3.5 - 5.1 mmol/L   Chloride 88 (L) 98 - 111 mmol/L   CO2 31 22 - 32 mmol/L   Glucose, Bld 117 (H) 70 - 99 mg/dL    Comment: Glucose reference range applies only to samples taken after fasting for at least 8 hours.   BUN 27 (H) 8 - 23 mg/dL   Creatinine, Ser 1.65 (H) 0.44 - 1.00 mg/dL   Calcium 9.1 8.9 - 10.3 mg/dL   Total Protein 7.7 6.5 - 8.1 g/dL   Albumin 3.7 3.5 - 5.0 g/dL   AST 18 15 - 41 U/L   ALT 28 0 - 44 U/L   Alkaline Phosphatase 100 38 - 126 U/L   Total Bilirubin 0.6 0.3 - 1.2 mg/dL   GFR calc non Af Amer 31 (L) >60 mL/min   GFR calc Af Amer 36 (L) >60 mL/min   Anion gap 9 5 - 15    Comment: Performed at [facility], [place] [address], [place], [place] [address]  Brain natriuretic peptide     Status: Abnormal   Collection Time: 10/17/19  1:29 PM  Result Value Ref Range   B Natriuretic Peptide 769.5 (H) 0.0 - 100.0 pg/mL    Comment: Performed at [facility], [place] [address], [place], [place] [address]  Urinalysis, Routine w reflex microscopic     Status: Abnormal   Collection Time: 10/17/19  2:29 PM  Result Value Ref Range   Color, Urine YELLOW YELLOW   APPearance CLOUDY (A) CLEAR   Specific Gravity, Urine 1.010 1.005 - 1.030   pH 6.0 5.0 - 8.0   Glucose, UA NEGATIVE NEGATIVE mg/dL   Hgb urine dipstick SMALL (A) NEGATIVE   Bilirubin Urine NEGATIVE NEGATIVE   Ketones, ur NEGATIVE NEGATIVE mg/dL   Protein, ur NEGATIVE NEGATIVE mg/dL   Nitrite POSITIVE (A) NEGATIVE   Leukocytes,Ua LARGE (A) NEGATIVE   RBC / HPF 0-5 0 - 5 RBC/hpf   WBC, UA 21-50 0 - 5 WBC/hpf   Bacteria, UA MANY (A) NONE SEEN   Squamous Epithelial / LPF 11-20 0 - 5   Mucus PRESENT     Comment: Performed at [facility], [place] [address] [place], [place] [address]   DG Chest  Portable 1 View  Result Date: 10/17/2019 CLINICAL DATA:  Shortness of breath. Generalized weakness. Bilateral leg swelling and numbness. EXAM: PORTABLE CHEST 1 VIEW COMPARISON:  11/17/2014. FINDINGS: Enlarged cardiac silhouette with an interval significant increase in size. Interval mild linear density in the left mid and lower lung zones. Progressively prominent interstitial markings with no Kerley lines or pleural fluid seen. Aortic arch calcifications. No acute bony abnormalities. IMPRESSION: 1. Interval cardiomegaly. 2. Interval mild chronic interstitial lung disease or minimal interstitial pulmonary edema. 3. Interval mild linear atelectasis or scarring in the left mid and lower lung zones. Electronically Signed   By: Claudie Revering M.D.   On: 10/17/2019 12:21    Review of Systems  Constitutional: Positive for activity change, fatigue and unexpected weight change.  Respiratory: Positive for chest tightness and shortness of breath. Negative for cough.   Cardiovascular: Positive for leg swelling. Negative for chest pain and palpitations.  Gastrointestinal: Negative for abdominal pain, constipation and diarrhea.  Genitourinary: Negative for difficulty urinating and urgency.  Musculoskeletal: Positive for arthralgias. Negative for neck stiffness.  Skin: Negative for rash and wound.  Neurological: Negative for dizziness, light-headedness and headaches.  Psychiatric/Behavioral: Negative for behavioral problems. The patient is not nervous/anxious.     Blood pressure 135/81, pulse 84, temperature 98.3 F (36.8 C), temperature source Oral, resp. rate (!) 26, SpO2 99 %. Physical Exam  Constitutional: She is oriented to person, place, and time. She appears well-developed and well-nourished. No distress.  Neck: No JVD present. No tracheal deviation present. No thyromegaly present.  Cardiovascular: Normal rate and regular rhythm.  Murmur heard. Respiratory: Effort normal. She has rales.  GI: She  exhibits no distension. There is no abdominal tenderness.  Musculoskeletal:        General: Edema present. Normal range of motion.  Neurological: She is alert and oriented to person, place, and time.  Skin: Skin is warm and dry.  Psychiatric: She has a normal mood and affect.     Assessment/Plan  CHF: New cardiomegaly, pulmonary edema, lower extremity edema, dyspnea on exertion meeting criteria for new onset CHF --> Echo --> Lasix, holding home diuretics (triamterene-HCT) see below --> Entresto (consider other ACE if financial issues) --> outpatient cardio follow-up unless decompensation   AKI vs CKD Decreased GFR, baseline uncertain but 2 years ago was greater than 60 with baseline creatinine 0.96, on admission BUN 27, creatinine 1.65, GFR 31 --> Careful diuresis with Lasix, holding home diuretics anyway d/t hyponatremia --> Avoid other nephrotoxic medications --> Trend BMP  Hyponatremia Possible d/t renal compromise, thiazide  --> serum Osm, Urine Osm and Na+  --> stopped thiazide  UTI --> Keflex pending UCx  Peripheral vascular disease Stable --> avoid tight Ted hose / compression stockings  Deconditioning, obesity --> PT/OT  Hx breast CA --> home meds  GERD --> home PPI  HTN  Controlled --> Adjust meds for CHF: Entresto, Metoprolol, Lasix --> Hold home Triamterene-HCT BP Readings from Last 3 Encounters:  10/17/19 135/81  08/31/19 123/63  09/21/17 (!) 126/58       Emeterio Reeve, DO 10/17/2019, 3:49 PM

## 2019-10-17 NOTE — ED Notes (Signed)
Pure wick has been placed. Suction set to 45mmHg.  

## 2019-10-17 NOTE — ED Triage Notes (Signed)
Pt BIB EMS from home. Pt c/o generalized weakness since last Saturday. Pt states there is numbness/swelling in legs bilaterally, pt states is due to her increased salt intake.   127/54 102 HR 96% 3L [place]- pt does not use O2 at home. Pt states is heavy smoker.

## 2019-10-17 NOTE — ED Notes (Signed)
Phlebotomy @ bedside  

## 2019-10-18 ENCOUNTER — Encounter (HOSPITAL_COMMUNITY): Payer: Self-pay | Admitting: Osteopathic Medicine

## 2019-10-18 ENCOUNTER — Inpatient Hospital Stay (HOSPITAL_COMMUNITY): Payer: Medicare PPO

## 2019-10-18 DIAGNOSIS — I5021 Acute systolic (congestive) heart failure: Secondary | ICD-10-CM

## 2019-10-18 DIAGNOSIS — R4781 Slurred speech: Secondary | ICD-10-CM

## 2019-10-18 LAB — BASIC METABOLIC PANEL
Anion gap: 8 (ref 5–15)
BUN: 24 mg/dL — ABNORMAL HIGH (ref 8–23)
CO2: 34 mmol/L — ABNORMAL HIGH (ref 22–32)
Calcium: 8.9 mg/dL (ref 8.9–10.3)
Chloride: 92 mmol/L — ABNORMAL LOW (ref 98–111)
Creatinine, Ser: 1.63 mg/dL — ABNORMAL HIGH (ref 0.44–1.00)
GFR calc Af Amer: 37 mL/min — ABNORMAL LOW (ref >60)
GFR calc non Af Amer: 32 mL/min — ABNORMAL LOW (ref >60)
Glucose, Bld: 117 mg/dL — ABNORMAL HIGH (ref 70–99)
Potassium: 3.8 mmol/L (ref 3.5–5.1)
Sodium: 134 mmol/L — ABNORMAL LOW (ref 135–145)

## 2019-10-18 LAB — BLOOD GAS, ARTERIAL
Acid-Base Excess: 5.4 mmol/L — ABNORMAL HIGH (ref 0.0–2.0)
Acid-Base Excess: 6.4 mmol/L — ABNORMAL HIGH (ref 0.0–2.0)
Bicarbonate: 38 mmol/L — ABNORMAL HIGH (ref 20.0–28.0)
Bicarbonate: 38.1 mmol/L — ABNORMAL HIGH (ref 20.0–28.0)
FIO2: 40
O2 Saturation: 96 %
O2 Saturation: 90.7 %
Patient temperature: 98.6
Patient temperature: 98
pCO2 arterial: 108 mmHg (ref 32.0–48.0)
pCO2 arterial: 96.2 mmHg (ref 32.0–48.0)
pH, Arterial: 7.172 — CL (ref 7.350–7.450)
pH, Arterial: 7.219 — ABNORMAL LOW (ref 7.350–7.450)
pO2, Arterial: 89.3 mmHg (ref 83.0–108.0)
pO2, Arterial: 64.5 mmHg — ABNORMAL LOW (ref 83.0–108.0)

## 2019-10-18 LAB — ECHOCARDIOGRAM COMPLETE
Height: 67 in
Weight: 5619.08 oz

## 2019-10-18 LAB — HIV ANTIBODY (ROUTINE TESTING W REFLEX): HIV Screen 4th Generation wRfx: NONREACTIVE

## 2019-10-18 LAB — MRSA PCR SCREENING: MRSA by PCR: NEGATIVE

## 2019-10-18 MED ORDER — ASPIRIN 81 MG PO CHEW: 81.0000 mg | CHEWABLE_TABLET | Freq: Every day | ORAL | Status: DC

## 2019-10-18 MED ORDER — CHLORHEXIDINE GLUCONATE CLOTH 2 % EX PADS
6.0000 | MEDICATED_PAD | Freq: Every day | CUTANEOUS | Status: DC
  Administered 2019-10-19 – 2019-11-02 (×15): 6 via TOPICAL

## 2019-10-18 MED ORDER — CHLORHEXIDINE GLUCONATE 0.12 % MT SOLN
15.0000 mL | Freq: Two times a day (BID) | OROMUCOSAL | Status: DC
  Administered 2019-10-18 – 2019-11-02 (×26): 15 mL via OROMUCOSAL
  Filled 2019-10-18 (×26): qty 15

## 2019-10-18 MED ORDER — ORAL CARE MOUTH RINSE
15.0000 mL | Freq: Two times a day (BID) | OROMUCOSAL | Status: DC
  Administered 2019-10-19 – 2019-11-02 (×15): 15 mL via OROMUCOSAL

## 2019-10-18 MED ORDER — PERFLUTREN LIPID MICROSPHERE
1.0000 mL | INTRAVENOUS | Status: AC | PRN
  Administered 2019-10-18: 4 mL via INTRAVENOUS
  Filled 2019-10-18: qty 10

## 2019-10-18 MED ORDER — NICOTINE 7 MG/24HR TD PT24
7.0000 mg | MEDICATED_PATCH | Freq: Every day | TRANSDERMAL | Status: DC
  Administered 2019-10-19 – 2019-10-20 (×2): 7 mg via TRANSDERMAL
  Filled 2019-10-18 (×16): qty 1

## 2019-10-18 MED ORDER — ASPIRIN 81 MG PO CHEW
81.0000 mg | CHEWABLE_TABLET | Freq: Every day | ORAL | Status: DC
  Administered 2019-10-19 – 2019-11-02 (×15): 81 mg via ORAL
  Filled 2019-10-18 (×15): qty 1

## 2019-10-18 NOTE — Progress Notes (Signed)
CRITICAL VALUE ALERT  Critical Value: pH 7.117 and CO2 108  Date & Time Notied:  10/19/19 1630  Provider Notified: Cordelia Poche MD  Orders Received/Actions taken: awaiting orders

## 2019-10-18 NOTE — Evaluation (Signed)
Physical Therapy Evaluation Patient Details Name: Daisy [name] MRN: [id number] DOB: Oct 13, 1950 Today's Date: 10/18/2019   History of Present Illness  69 yo female with onset of acute SOB and fatigue with alteration of mobility per family was admitted, had been having hand and foot cramps.  Noted significant LE edema, per nsg is diuresing.  PMHx:  OA, cholecystitis, depression, dyspnea, HLD, L breast CA, PVD, PNA, rotator cuff repair,   Clinical Impression  Pt is seen for mobility on side of bed, attempted to stand but is a struggle with LUE weakness.  Pt is having hip weakness, low O2 sats with effort and low control of balance in sitting and to attempt standing.  Will focus on mobility to get up and out to chair with O2 as needed, will work on sitting and standing balance and endurance and recovery of LE strength as tolerated.  Follow up with rehab, as pt is expecting to be home without full assist.      Follow Up Recommendations CIR    Equipment Recommendations  None recommended by PT(review during rehab stay)    Recommendations for Other Services Rehab consult     Precautions / Restrictions Precautions Precautions: Fall;Back(chronic back pain) Precaution Booklet Issued: No Restrictions Weight Bearing Restrictions: No      Mobility  Bed Mobility Overal bed mobility: Needs Assistance Bed Mobility: Supine to Sit;Sit to Supine     Supine to sit: Min assist Sit to supine: Mod assist   General bed mobility comments: min to support trunk to sit up and mod to lift legs onto bed  Transfers Overall transfer level: Needs assistance Equipment used: Rolling walker (2 wheeled);1 person hand held assist Transfers: Sit to/from Stand;Lateral/Scoot Transfers Sit to Stand: Max assist;From elevated surface;+2 physical assistance;+2 safety/equipment        Lateral/Scoot Transfers: Min assist;From elevated surface General transfer comment: weak to stand on walker due to inability  to use LUE and inabiltiy to do the motor planning  Ambulation/Gait             General Gait Details: deferred due to O2 sats and inability to stand safely  Stairs            Wheelchair Mobility    Modified Rankin (Stroke Patients Only)       Balance Overall balance assessment: Needs assistance Sitting-balance support: Feet supported;Single extremity supported Sitting balance-Leahy Scale: Poor Sitting balance - Comments: inconsistent follow through on sitting balance cues                                     Pertinent Vitals/Pain Pain Assessment: No/denies pain    Home Living Family/patient expects to be discharged to:: Private residence Living Arrangements: Alone Available Help at Discharge: Family;Available PRN/intermittently Type of Home: House Home Access: Stairs to enter   [employer] of Steps: 4 Home Layout: One level Home Equipment: Walker - 2 wheels Additional Comments: family is asking about getting a ramp built    Prior Function Level of Independence: Independent with assistive device(s)         Comments: was on RW at home, able to walk with no assist     Hand Dominance   Dominant Hand: Left    Extremity/Trunk Assessment   Upper Extremity Assessment Upper Extremity Assessment: LUE deficits/detail LUE Deficits / Details: difficulty with losing grip LUE Coordination: decreased gross motor;decreased fine motor    Lower  Extremity Assessment Lower Extremity Assessment: Generalized weakness    Cervical / Trunk Assessment Cervical / Trunk Assessment: Kyphotic  Communication   Communication: Expressive difficulties  Cognition Arousal/Alertness: Lethargic Behavior During Therapy: Flat affect Overall Cognitive Status: Impaired/Different from baseline Area of Impairment: Problem solving;Awareness;Safety/judgement;Following commands;Memory;Attention;Orientation                 Orientation Level:  Situation Current Attention Level: Selective Memory: Decreased recall of precautions;Decreased short-term memory Following Commands: Follows one step commands inconsistently;Follows one step commands with increased time Safety/Judgement: Decreased awareness of deficits;Decreased awareness of safety Awareness: Intellectual Problem Solving: Slow processing;Decreased initiation;Difficulty sequencing;Requires verbal cues;Requires tactile cues General Comments: pt was on side of bed and told PT to step back and that she could stand alone      General Comments General comments (skin integrity, edema, etc.): Pt was at 97% O2 initially, then down to 87-89% sitting and back to bed with 96% afterward.      Exercises     Assessment/Plan    PT Assessment Patient needs continued PT services  PT Problem List Decreased strength;Decreased range of motion;Decreased activity tolerance;Decreased balance;Decreased mobility;Decreased coordination;Decreased cognition;Decreased knowledge of use of DME;Decreased safety awareness;Cardiopulmonary status limiting activity;Obesity       PT Treatment Interventions DME instruction;Gait training;Stair training;Functional mobility training;Therapeutic activities;Therapeutic exercise;Balance training;Neuromuscular re-education;Patient/family education    PT Goals (Current goals can be found in the Care Plan section)  Acute Rehab PT Goals Patient Stated Goal: none stated PT Goal Formulation: With patient/family Time For Goal Achievement: 11/01/19 Potential to Achieve Goals: Fair    Frequency Min 4X/week   Barriers to discharge Inaccessible home environment;Decreased caregiver support has stairs to enter house and is living without assist    Co-evaluation               AM-PAC PT "6 Clicks" Mobility  Outcome Measure Help needed turning from your back to your side while in a flat bed without using bedrails?: A Little Help needed moving from lying on your  back to sitting on the side of a flat bed without using bedrails?: A Little Help needed moving to and from a bed to a chair (including a wheelchair)?: A Lot Help needed standing up from a chair using your arms (e.g., wheelchair or bedside chair)?: A Lot Help needed to walk in hospital room?: Total Help needed climbing 3-5 steps with a railing? : Total 6 Click Score: 12    End of Session Equipment Utilized During Treatment: Oxygen Activity Tolerance: Patient limited by fatigue;Treatment limited secondary to medical complications (Comment) Patient left: in bed;with call bell/phone within reach;with bed alarm set;with family/visitor present Nurse Communication: Mobility status;Other (comment)(discharge plans) PT Visit Diagnosis: Muscle weakness (generalized) (M62.81);Other abnormalities of gait and mobility (R26.89);Difficulty in walking, not elsewhere classified (R26.2)    Time: 1212-1250 PT Time Calculation (min) (ACUTE ONLY): 38 min   Charges:   PT Evaluation $PT Eval Moderate Complexity: 1 Mod PT Treatments $Therapeutic Exercise: 8-22 mins $Therapeutic Activity: 8-22 mins       Ramond Dial 10/18/2019, 1:29 PM  Mee Hives, PT MS Acute Rehab Dept. Number: [place]

## 2019-10-18 NOTE — Progress Notes (Signed)
Pt had 12 beats of VT. VSS. MD notified. Currently awaiting bed in SDU. Will continue to monitor pt.

## 2019-10-18 NOTE — Progress Notes (Signed)
Patient ABG obtained per order. Patient on 6 L [place] at the time of ABG. O2 sats were 96%. Patient on 4 L now with O2 sats 95%.

## 2019-10-18 NOTE — Progress Notes (Addendum)
PROGRESS NOTE    Daisy [name]  TKZ:[id number] DOB: 1950/09/01 DOA: 10/17/2019 PCP: Alvester Chou, NP   Brief Narrative: Daisy [name] is a 69 y.o. female with a history of OHS, GERD, breast cancer, hypertension, hyperlipidemia, PVD. Patient presented secondary to fatigue and dyspnea on exertion. Concern on admission for acute heart failure. Started on IV lasix.   Assessment & Plan:   Active Problems:   Claudication in peripheral vascular disease (HCC)   Obesity hypoventilation syndrome (HCC)   GERD (gastroesophageal reflux disease)   Acute heart failure (HCC)   History of breast cancer   AKI (acute kidney injury) ([place])   Essential hypertension   Dyspnea on exertion Acute respiratory failure with hypoxia Likely acute heart failure. No previous diagnosis of heart failure. Patient started on Lasix IV diuresis. Started on Entresto and Toprol XL on admission. -Discontinue Entresto in setting of AKI in addition to awaiting result from Transthoracic Echocardiogram. -Continue Toprol XL for now -Follow up Transthoracic Echocardiogram -Daily weights, strict in and out  AKI Baseline creatinine of 0.9 from 2019. Creatinine of 1.65 on admission presumably secondary to effective hypovolemia for heart failure. UOP of 1.8 L with one unmeasured occurrence documented in the last 12 hours. -Daily BMP  Essential hypertension Patient is on Maxide as an outpatient. Started on antihypertensives as mentioned above -Hold Maxide pending antihypertensive management decision based on above  History of breast cancer Patient is on anastrozole as an outpatient. Follows with Dr. Jana Hakim. S/p left breast lumpectomy with radioactive seed. She did not receive chemotherapy. -Continue anastrozole  PAD with claudication Patient with a history of peripheral arteriogram with successful revascularization with stent placement in 2015 of left SFA. On Pletal and aspirin as an outpatient. -Continue  Pletal and aspirin  GERD -Continue Protonix  Addendum: Family came to hospital with concern of stroke in patient. Patient was her last normal self four days ago. Over the weekend, she had a fall; firefighters were called and patient declined treatment. At that time low concern for stroke and patient later stated that her legs just gave out. As the weekend progressed, patient was doing slightly better but yesterday patient was noticed to have worsening slurred speech and not acting like her normal self. On reexamination with more focus on neurologic exam, patient with equal upper and lower extremity strength at 4/5 upper and 3/5 lower. Arms exhibit a jerk motion with movement only. Could not elicit reflexes secondary to body habitus. Patient with visual deficit of left eye and unable to perform right eye exam secondary to difficulty opening right eyelid; patient able to see fingers/shapes but has difficulty expressing to me exactly how she sees. Patient unable to perform finger-nose accurately in setting of poor vision but could not accurately touch her own nose. Will obtain ABG, CT head. Transthoracic Echocardiogram reviewed and concern for possible right sided heart failure so will consult patient's primary cardiologist, Dr. Einar Gip.  Addendum #2 Patient with evidence of severe hypercarbia on ABG with associated acidemia. Patient with acute, uncompensated respiratory acidosis. Will start BiPAP and transfer to stepdown. Will repeat ABG after BiPAP on for 1-2 hours.   DVT prophylaxis: Lovenox Code Status:   Code Status: Full Code Family Communication: Two daughters at bedside Disposition Plan: Discharge pending continued workup for heart failure, wean to room air   Consultants:   None  Procedures:   None  Antimicrobials:  None    Subjective: Feeling slightly better than admission.  Objective: Vitals:   10/17/19 1957 10/17/19  2340 10/18/19 0200 10/18/19 0612  BP: (!) 142/70  (!)  150/72 125/62  Pulse: 90  93 86  Resp: 20  (!) 24 (!) 22  Temp: 98.3 F (36.8 C)  98.2 F (36.8 C) 98.2 F (36.8 C)  TempSrc: Oral  Oral Oral  SpO2: 98%  93% 96%  Weight:  (!) 160.6 kg  (!) 159.3 kg  Height:  5\' 7"  (1.702 m)      Intake/Output Summary (Last 24 hours) at 10/18/2019 0729 Last data filed at 10/18/2019 0600 Gross per 24 hour  Intake 0 ml  Output 1800 ml  Net -1800 ml   Filed Weights   10/17/19 2340 10/18/19 0612  Weight: (!) 160.6 kg (!) 159.3 kg    Examination:  General exam: Appears calm and comfortable Respiratory system: Wheezing bilaterally. Respiratory effort normal. Cardiovascular system: S1 & S2 heard, RRR. No murmurs, rubs, gallops or clicks. Gastrointestinal system: Abdomen is nondistended, soft and nontender. No organomegaly or masses felt. Normal bowel sounds heard. Central nervous system: Sleepy but alert and oriented. No focal neurological deficits. Extremities: LE edema. No calf tenderness Skin: No cyanosis. No rashes Psychiatry: Judgement and insight appear normal. Mood & affect appropriate.     Data Reviewed: I have personally reviewed following labs and imaging studies  CBC: Recent Labs  Lab 10/17/19 1329  WBC 7.9  NEUTROABS 6.2  HGB 15.1*  HCT 48.7*  MCV 99.4  PLT 235   Basic Metabolic Panel: Recent Labs  Lab 10/17/19 1329 10/18/19 0459  NA 128* 134*  K 4.5 3.8  CL 88* 92*  CO2 31 34*  GLUCOSE 117* 117*  BUN 27* 24*  CREATININE 1.65* 1.63*  CALCIUM 9.1 8.9   GFR: Estimated Creatinine Clearance: 51.8 mL/min (A) (by C-G formula based on SCr of 1.63 mg/dL (H)). Liver Function Tests: Recent Labs  Lab 10/17/19 1329  AST 18  ALT 28  ALKPHOS 100  BILITOT 0.6  PROT 7.7  ALBUMIN 3.7   No results for input(s): LIPASE, AMYLASE in the last 168 hours. No results for input(s): AMMONIA in the last 168 hours. Coagulation Profile: No results for input(s): INR, PROTIME in the last 168 hours. Cardiac Enzymes: No results for  input(s): CKTOTAL, CKMB, CKMBINDEX, TROPONINI in the last 168 hours. BNP (last 3 results) No results for input(s): PROBNP in the last 8760 hours. HbA1C: No results for input(s): HGBA1C in the last 72 hours. CBG: No results for input(s): GLUCAP in the last 168 hours. Lipid Profile: No results for input(s): CHOL, HDL, LDLCALC, TRIG, CHOLHDL, LDLDIRECT in the last 72 hours. Thyroid Function Tests: No results for input(s): TSH, T4TOTAL, FREET4, T3FREE, THYROIDAB in the last 72 hours. Anemia Panel: No results for input(s): VITAMINB12, FOLATE, FERRITIN, TIBC, IRON, RETICCTPCT in the last 72 hours. Sepsis Labs: No results for input(s): PROCALCITON, LATICACIDVEN in the last 168 hours.  Recent Results (from the past 240 hour(s))  Respiratory Panel by RT PCR (Flu A&B, Covid) - Nasopharyngeal Swab     Status: None   Collection Time: 10/17/19 12:48 PM   Specimen: Nasopharyngeal Swab  Result Value Ref Range Status   SARS Coronavirus 2 by RT PCR NEGATIVE NEGATIVE Final    Comment: (NOTE) SARS-CoV-2 target nucleic acids are NOT DETECTED. The SARS-CoV-2 RNA is generally detectable in upper respiratoy specimens during the acute phase of infection. The lowest concentration of SARS-CoV-2 viral copies this assay can detect is 131 copies/mL. A negative result does not preclude SARS-Cov-2 infection and should not be used  as the sole basis for treatment or other patient management decisions. A negative result may occur with  improper specimen collection/handling, submission of specimen other than nasopharyngeal swab, presence of viral mutation(s) within the areas targeted by this assay, and inadequate number of viral copies (<131 copies/mL). A negative result must be combined with clinical observations, patient history, and epidemiological information. The expected result is Negative. Fact Sheet for Patients:  PinkCheek.be Fact Sheet for Healthcare Providers:    GravelBags.it This test is not yet ap proved or cleared by the [place] FDA and  has been authorized for detection and/or diagnosis of SARS-CoV-2 by FDA under an Emergency Use Authorization (EUA). This EUA will remain  in effect (meaning this test can be used) for the duration of the COVID-19 declaration under Section 564(b)(1) of the Act, 21 U.S.C. section 360bbb-3(b)(1), unless the authorization is terminated or revoked sooner.    Influenza A by PCR NEGATIVE NEGATIVE Final   Influenza B by PCR NEGATIVE NEGATIVE Final    Comment: (NOTE) The Xpert Xpress SARS-CoV-2/FLU/RSV assay is intended as an aid in  the diagnosis of influenza from Nasopharyngeal swab specimens and  should not be used as a sole basis for treatment. Nasal washings and  aspirates are unacceptable for Xpert Xpress SARS-CoV-2/FLU/RSV  testing. Fact Sheet for Patients: PinkCheek.be Fact Sheet for Healthcare Providers: GravelBags.it This test is not yet approved or cleared by the [place] FDA and  has been authorized for detection and/or diagnosis of SARS-CoV-2 by  FDA under an Emergency Use Authorization (EUA). This EUA will remain  in effect (meaning this test can be used) for the duration of the  Covid-19 declaration under Section 564(b)(1) of the Act, 21  U.S.C. section 360bbb-3(b)(1), unless the authorization is  terminated or revoked. Performed at [facility], [place] [address] [place], [place] [address]          Radiology Studies: DG Chest Portable 1 View  Result Date: 10/17/2019 CLINICAL DATA:  Shortness of breath. Generalized weakness. Bilateral leg swelling and numbness. EXAM: PORTABLE CHEST 1 VIEW COMPARISON:  11/17/2014. FINDINGS: Enlarged cardiac silhouette with an interval significant increase in size. Interval mild linear density in the left mid and lower lung zones. Progressively  prominent interstitial markings with no Kerley lines or pleural fluid seen. Aortic arch calcifications. No acute bony abnormalities. IMPRESSION: 1. Interval cardiomegaly. 2. Interval mild chronic interstitial lung disease or minimal interstitial pulmonary edema. 3. Interval mild linear atelectasis or scarring in the left mid and lower lung zones. Electronically Signed   By: Claudie Revering M.D.   On: 10/17/2019 12:21        Scheduled Meds: . anastrozole  1 mg Oral Daily  . cephALEXin  500 mg Oral Q12H  . cilostazol  100 mg Oral BID  . enoxaparin (LOVENOX) injection  40 mg Subcutaneous Q24H  . furosemide  40 mg Intravenous BID  . metoprolol succinate  12.5 mg Oral Daily  . multivitamin  1 tablet Oral Daily  . pantoprazole  20 mg Oral Daily  . sodium chloride flush  3 mL Intravenous Q12H   Continuous Infusions: . sodium chloride       LOS: 1 day     Cordelia Poche, MD [facility] 10/18/2019, 7:29 AM  If 7PM-7AM, please contact night-coverage [email or web address]

## 2019-10-18 NOTE — Progress Notes (Signed)
  Echocardiogram 2D Echocardiogram has been performed.  Michiel Cowboy 10/18/2019, 8:56 AM

## 2019-10-18 NOTE — Progress Notes (Signed)
Rehab Admissions Coordinator Note:  Per PT recommendation, this patient was screened by Raechel Ache for appropriateness for an Inpatient Acute Rehab Consult.  At this time, we will follow along for completion of medical workup. If pt continues to demonstrate difficulty with with mobility over the next few days, then we can consider CIR assessment. Will follow at a distance.    Raechel Ache 10/18/2019, 5:02 PM  I can be reached at [phone].

## 2019-10-18 NOTE — Progress Notes (Addendum)
Switched from regular nasal canula to high flow on 4L to keep sats @ 88-89 while asleep.  Pt arousable to voice and touch but immediately wants to go back to sleep, takes constant stimulation with voice to keep awake.  When awake pt is 90-92 on O2.  Pt was given 20 mg oral Lasix in ED and an additional 40mg  IV lasix after arrival to the unit @ 2052 Pt has put out 112mL urine since arriving from the ED.  Paged [facility] oncall to see if there were any other interventions we could do at this time.   Will continue to monitor  Called respiratory about the possibility of ordering a CPAP for a patient that doesn't have a home CPAP, RN was informed that yes CPAP can be ordered but only the nasal mask which in turn usually causes the patients to mouth breathe.  Respiratory does not recommend placing the pt on a non rebreather mask, but instead use the HFNC up to 15L to create the positive upper airway pressure to keep the patients O2 sats up when asleep.

## 2019-10-19 ENCOUNTER — Other Ambulatory Visit: Payer: Self-pay | Admitting: Oncology

## 2019-10-19 LAB — BLOOD GAS, ARTERIAL
Acid-Base Excess: 12.7 mmol/L — ABNORMAL HIGH (ref 0.0–2.0)
Bicarbonate: 42 mmol/L — ABNORMAL HIGH (ref 20.0–28.0)
Drawn by: 441261
FIO2: 40
O2 Saturation: 94.8 %
PIP: 12 cmH2O
Patient temperature: 98.6
RATE: 15 resp/min
pCO2 arterial: 81.2 mmHg (ref 32.0–48.0)
pH, Arterial: 7.333 — ABNORMAL LOW (ref 7.350–7.450)
pO2, Arterial: 74.7 mmHg — ABNORMAL LOW (ref 83.0–108.0)

## 2019-10-19 LAB — URINE CULTURE: Culture: >=100000 — AB

## 2019-10-19 LAB — GLUCOSE, CAPILLARY: Glucose-Capillary: 89 mg/dL (ref 70–99)

## 2019-10-19 LAB — MAGNESIUM: Magnesium: 2.1 mg/dL (ref 1.7–2.4)

## 2019-10-19 LAB — BASIC METABOLIC PANEL
Anion gap: 11 (ref 5–15)
BUN: 27 mg/dL — ABNORMAL HIGH (ref 8–23)
CO2: 36 mmol/L — ABNORMAL HIGH (ref 22–32)
Calcium: 8.6 mg/dL — ABNORMAL LOW (ref 8.9–10.3)
Chloride: 90 mmol/L — ABNORMAL LOW (ref 98–111)
Creatinine, Ser: 1.61 mg/dL — ABNORMAL HIGH (ref 0.44–1.00)
GFR calc Af Amer: 37 mL/min — ABNORMAL LOW (ref >60)
GFR calc non Af Amer: 32 mL/min — ABNORMAL LOW (ref >60)
Glucose, Bld: 93 mg/dL (ref 70–99)
Potassium: 3.2 mmol/L — ABNORMAL LOW (ref 3.5–5.1)
Sodium: 137 mmol/L (ref 135–145)

## 2019-10-19 LAB — TSH: TSH: 0.267 u[IU]/mL — ABNORMAL LOW (ref 0.350–4.500)

## 2019-10-19 MED ORDER — POTASSIUM CHLORIDE CRYS ER 20 MEQ PO TBCR
40.0000 meq | EXTENDED_RELEASE_TABLET | Freq: Every day | ORAL | Status: DC
  Administered 2019-10-19 – 2019-10-21 (×3): 40 meq via ORAL
  Filled 2019-10-19 (×3): qty 2

## 2019-10-19 MED ORDER — FUROSEMIDE 10 MG/ML IJ SOLN
40.0000 mg | Freq: Two times a day (BID) | INTRAMUSCULAR | Status: DC
  Administered 2019-10-19 – 2019-10-23 (×8): 40 mg via INTRAVENOUS
  Filled 2019-10-19 (×8): qty 4

## 2019-10-19 MED ORDER — ENOXAPARIN SODIUM 60 MG/0.6ML ~~LOC~~ SOLN
60.0000 mg | Freq: Every day | SUBCUTANEOUS | Status: DC
  Administered 2019-10-19 – 2019-10-24 (×6): 60 mg via SUBCUTANEOUS
  Filled 2019-10-19 (×6): qty 0.6

## 2019-10-19 NOTE — Progress Notes (Deleted)
Might need trach--will not be ready at least until weekend over

## 2019-10-19 NOTE — Progress Notes (Signed)
COURTESY NOTE: I follow [name] for her stage I breast cancer, which is not active and may be cured. She receives anastrozole daily--this aromatase inhibitor does not affect clotting. If can be held if she has difficulty swallowing or for any other reason and be resumed at d/c. I am including a summary of her cancer history though I do not believe it is relevant to this admission.    69 y.o. Daisy [name], [place] woman Status post left breast upper outer quadrant biopsy 09/24/2016 of an area of architectural distortion measuring 3.7 cm, showing invasive ductal carcinoma, grade 1, estrogen and progesterone receptor positive, HER-2 nonamplified, with an MIB-1 of 5%.             (a) a 0.3 cm area of linear calcifications in the superior aspect of the breast Was benign on biopsy 10/02/2016   (1) left lumpectomy and sentinel lymph node sampling 11/19/2016 found ([id number]) invasive ductal carcinoma, measuring 1.9 cm, grade 2, with all 3 sentinel lymph nodes clear. However the medial margin was involved             (a) additional surgery 12/12/2016 cleared the involved margin (as CA [id number]  (2) Oncotype DX  score of 14 predicted a 10 year risk of outside the breast recurrence of 9% if the patient's only systemic therapy is anti-estrogens for 5 years. It also predicted no benefit from chemotherapy.  (3) adjuvant radiation 01/20/2017 to 03/02/2017 Site/dose:The Left breast was treated to 50.4 Gy in 18 fractions of 1.8 Gy.   (4) anastrozole started April 2018  (5) tobacco abuse: The patient has been strongly advised to discontinue smoking    Please let me know if I can be of any help

## 2019-10-19 NOTE — Evaluation (Addendum)
Clinical/Bedside Swallow Evaluation Patient Details  Name: Daisy [name] MRN: [id number] Date of Birth: July 10, 1950  Today's Date: 10/19/2019 Time: SLP Start Time (ACUTE ONLY): 52 SLP Stop Time (ACUTE ONLY): 1130 SLP Time Calculation (min) (ACUTE ONLY): 25 min  Past Medical History:  Past Medical History:  Diagnosis Date  . Acid reflux   . Arthritis   . Cholecystitis   . Depression   . Dyspnea   . History of radiation therapy 01/20/17-03/02/17   left breast was treated to 50.4 Gy in 18 fractions  . Hyperlipemia   . Irregular heart rate   . Malignant neoplasm of upper-outer quadrant of left breast in female, estrogen receptor positive ([place]) 09/30/2016  . Obesity   . Peripheral vascular disease ([place])   . Pneumonia    Past Surgical History:  Past Surgical History:  Procedure Laterality Date  . ABDOMINAL HYSTERECTOMY    . BREAST LUMPECTOMY Left 11/19/2016   BREAST LUMPECTOMY WITH RADIOACTIVE SEED AND SENTINEL LYMPH NODE BIOPSY (Left)  . BREAST LUMPECTOMY WITH RADIOACTIVE SEED AND SENTINEL LYMPH NODE BIOPSY Left 11/19/2016   Procedure: BREAST LUMPECTOMY WITH RADIOACTIVE SEED AND SENTINEL LYMPH NODE BIOPSY;  Surgeon: Rolm Bookbinder, MD;  Location: [facility];  Service: General;  Laterality: Left;  . CESAREAN SECTION    . CESAREAN SECTION     x 2  . CHOLECYSTECTOMY  2007  . KNEE ARTHROSCOPY     bilateral  . KNEE SURGERY Bilateral   . LOWER EXTREMITY ANGIOGRAM N/A 04/04/2014   Procedure: LOWER EXTREMITY ANGIOGRAM;  Surgeon: Laverda Page, MD;  Location: [facility] C[facility];  Service: Cardiovascular;  Laterality: N/A;  . OVARIAN CYST REMOVAL    . RE-EXCISION OF BREAST LUMPECTOMY Left 12/12/2016   Procedure: RE-EXCISION OF LEFT BREAST LUMPECTOMY;  Surgeon: Rolm Bookbinder, MD;  Location: [facility];  Service: General;  Laterality: Left;  . ROTATOR CUFF REPAIR Right   . TONSILLECTOMY    . WRIST SURGERY Bilateral    HPI:  Daisy [name] is a 69 y.o. female with a history of OHS,  GERD, breast cancer, hypertension, hyperlipidemia, PVD. Patient presented secondary to fatigue and dyspnea on exertion. Concern on admission for acute heart failure.  evidence of severe hypercarbia on ABG with associated acidemia. Patient with acute, uncompensated respiratory acidosis. Pt was put on bipap.   Assessment / Plan / Recommendation Clinical Impression  Pt with negative cranial nerve exam, No indication of aspiration with thin via cup, applesauce, nor [place] sandwich. She does intermittently cough and/or clear her throat after swallowing pills and liquids via straws.  Pt also tends to extend neck with swalloiwng large pills effectively opening airway.  She admits to occasionaly coughing with foods more than liquids but recent frequent episodes of "liquids coming back up" requiring her to "swallow again".  She denies dysphagia associated with radiation treatment.    SLP recommends regular diet to allow her to choose foods she can manage and consider dedicated esophageal evaluation due to her esophageal symptoms.  Advised her to SLOW rate of intake, take medications with puree and follow reflux precautions.    SLP to follow up x1 for education.  Of note, pt appeared with right eye twitching and right and left arm weakness c/b decreased ability to feed self with appearance of athetosis type movement.  SLP messaged MD with recommendations.   SLP Visit Diagnosis: Dysphagia, unspecified (R13.10)    Aspiration Risk  Mild aspiration risk    Diet Recommendation Regular;Thin liquid   Liquid Administration  via: Cup;Straw Medication Administration: Whole meds with puree Supervision: Patient able to self feed;Intermittent supervision to cue for compensatory strategies Compensations: Slow rate;Small sips/bites Postural Changes: Seated upright at 90 degrees    Other  Recommendations Oral Care Recommendations: Oral care BID   Follow up Recommendations        Frequency and Duration min 1 x/week   1 week       Prognosis Prognosis for Safe Diet Advancement: Good      Swallow Study   General Date of Onset: 10/19/19 HPI: Daisy [name] is a 69 y.o. female with a history of OHS, GERD, breast cancer, hypertension, hyperlipidemia, PVD. Patient presented secondary to fatigue and dyspnea on exertion. Concern on admission for acute heart failure.  evidence of severe hypercarbia on ABG with associated acidemia. Patient with acute, uncompensated respiratory acidosis. Pt was put on bipap. Type of Study: Bedside Swallow Evaluation Previous Swallow Assessment: none Diet Prior to this Study: Regular;Thin liquids Temperature Spikes Noted: No Respiratory Status: Nasal cannula History of Recent Intubation: No(bipap overnight and in am) Behavior/Cognition: Alert;Cooperative;Pleasant mood Oral Cavity Assessment: Within Functional Limits Oral Care Completed by SLP: No Oral Cavity - Dentition: Edentulous(pt does not have dentures) Vision: Functional for self-feeding(pt has macular degeneration) Self-Feeding Abilities: Needs set up Patient Positioning: Upright in bed Baseline Vocal Quality: Normal Volitional Cough: Strong Volitional Swallow: Able to elicit    Oral/Motor/Sensory Function Overall Oral Motor/Sensory Function: Within functional limits   Ice Chips Ice chips: Not tested   Thin Liquid Thin Liquid: Impaired Pharyngeal  Phase Impairments: Throat Clearing - Immediate;Cough - Immediate Other Comments: subtle dry cough intermittently present after swallowing pills - no coughing after swallowing liquids alone, pt passed 3 ounce [place] water test    Nectar Thick Nectar Thick Liquid: Not tested   Honey Thick Honey Thick Liquid: Not tested   Puree Puree: Within functional limits Presentation: Self Fed;Spoon   Solid     Solid: Within functional limits Presentation: Self Fed Other Comments: pt takes very large bites and eats at rapid rate - reports this to be baseline - advised  against this behavior      Macario Golds 10/19/2019,11:41 AM    Kathleen Lime, MS [place] Office [phone]

## 2019-10-19 NOTE — Progress Notes (Signed)
Lovenox per Pharmacy for DVT Prophylaxis    Pharmacy has been consulted from dosing enoxaparin (lovenox) in this patient for DVT prophylaxis.  The pharmacist has reviewed pertinent labs (Hgb _15.1__; PLT__232_), patient weight (_153__kg) and renal function (CrCl_50__mL/min) and decided that enoxaparin _60_mg SQ Q24Hrs is appropriate for this patient.  The pharmacy department will sign off at this time.  Please reconsult pharmacy if status changes or for further issues.  Thank you  Cyndia Diver PharmD, BCPS  10/19/2019, 5:43 AM

## 2019-10-19 NOTE — Progress Notes (Signed)
PROGRESS NOTE    Daisy [name]  XLK:[id number] DOB: 04-10-1951 DOA: 10/17/2019 PCP: Alvester Chou, NP    Chief Complaint  Patient presents with  . Shortness of Breath  . Weakness  . Leg Swelling    Brief Narrative:  32 white female estrogen receptor + breast cancer (Dr. Knox Royalty post L lumpectomy/adjuvant XRT 2018 Tobacco + PAD (L duplex 07/17/2015 L ABI 0.68 R ABI 0.61) Class III severe obesity BMI 54 Reflux HTN-Home meds do not include any diuretics Admit 10/17/2019 cramping hands feet (recently received coronavirus 19 vaccination) Circumstantial evidence = DOE, increased fatigue, increased L LE edema CXR = cardiac enlargement, CHF Lasix started in ED, meds adjusted     Assessment & Plan:   Active Problems:   Claudication in peripheral vascular disease ([place])   Obesity hypoventilation syndrome (HCC)   GERD (gastroesophageal reflux disease)   Acute heart failure (HCC)   History of breast cancer   AKI (acute kidney injury) ([place])   Essential hypertension   Slurred speech AECHF presumed diastolic-BNP on admission 769 echocardiogram is pending OHSS?  Sleep apnea given habitus-pulmonary hypertension type III Events 5/4 PM noted-patient placed on BiPAP sent to stepdown unit ABG = 7.17 initially now improved to 7.21-likely both a severe restrictive obstructive pattern Diurese Lasix IV 40 twice daily-cautious continuation metoprolol XL 12.5 Discontinue scheduled Naprosyn on discharge [name] has been held from admission might need pulmonology to see--- she has not been able to liberate from the CPAP for more than an hour and becomes tired afterwards I had a discussion with Dr. Carlis Abbott briefly about this patient as a curbside-I think that patient may require eventual tracheotomy given her habitus  Likely cardiorenal syndrome with AKI-Baseline creatinine 08/03/2016 Hypervolemic hyponatremia on admission Osmolality is near normal at 283-hold on urine studies  given resolution of hyponatremia -Confounding factor is BMI of 50 Estimated Creatinine Clearance: 50.4 mL/min (A) (by C-G formula based on SCr of 1.61 mg/dL (H)). Hyponatremia resolved with continued diuresis--need to continue diuresing her slightly alkalotic CO2 now 36 BUN/creatinine stable 27/1.6  Class III morbid obesity BMI >50 Continue volume restriction, education regarding diet, may benefit from further input as an outpatient bariatrics  Mild hypokalemia 3.2 Add on magnesium this morning-diuresis as above and may adjust p.m. dosing    DVT prophylaxis: Lovenox Code Status: Full Family Communication: Long discussion with both family members and daughters at the bedside explained again later this evening that she is not to eat except for sips with meds and we will try to ride this out with just BiPAP  Disposition: We will need to stay on stepdown until her acute respiratory issues have resolved  Status is: Inpatient  Remains inpatient appropriate because:Hemodynamically unstable, Persistent severe electrolyte disturbances and Unsafe d/c plan   Dispo: The patient is from: Home              Anticipated d/c is to: SNF              Anticipated d/c date is: > 3 days              Patient currently is not medically stable to d/c.    Consultants:   Curbside pulmonology  Procedures: None  Antimicrobials: No   Subjective: Seen this morning and was not on BiPAP however was breathing somewhat uncomfortably- No chest pain no fever Does not recall events leading up to her coming into the hospital Later on nursing informed me that she tired out about 1 hour after  I saw her and 1 hour after BiPAP and breakfast and need to be placed back on BiPAP She is requesting to eat now at around 3 PM but I have explained to her completely and carefully that I do not think that is a good idea She has no chest pain at this time She is able to verbalize and is coherent and knows where she  is  Objective: Vitals:   10/19/19 0400 10/19/19 0417 10/19/19 0429 10/19/19 0600  BP: (!) 99/47   (!) 127/55  Pulse: 78  80 86  Resp: (!) 31  15 (!) 24  Temp: 97.6 F (36.4 C)     TempSrc: Axillary     SpO2: 93%  96% 93%  Weight:  (!) 153.4 kg    Height:        Intake/Output Summary (Last 24 hours) at 10/19/2019 5035 Last data filed at 10/19/2019 4656 Gross per 24 hour  Intake 240 ml  Output 3300 ml  Net -3060 ml   Filed Weights   10/18/19 0612 10/18/19 1752 10/19/19 0417  Weight: (!) 159.3 kg (!) 153.4 kg (!) 153.4 kg    Examination:  General exam: Awake on BiPAP coherent Respiratory system: Poor exam limited secondary to BiPAP/massive habitus Cardiovascular system: S1-S2 no murmur poor exam Gastrointestinal system: Obese nontender nondistended no rebound no guarding. Central nervous system: Intact no focal deficit Extremities: Mild lower extremity edema- Skin: As above Psychiatry: Flat    Data Reviewed: I have personally reviewed following labs and imaging studies Potassium 3.2 bicarb 36 BUN/creatinine 27/1.6 TSH 0.26 Blood gas at this time is pending  Radiology Studies: DG Chest 2 View  Result Date: 10/18/2019 CLINICAL DATA:  Congestive heart failure. EXAM: CHEST - 2 VIEW COMPARISON:  10/17/2019 FINDINGS: Cardiac enlargement. Decreased lung volumes. Small pleural effusions and mild interstitial edema. Bibasilar atelectasis. No airspace consolidation. IMPRESSION: Cardiac enlargement and CHF. Electronically Signed   By: Kerby Moors M.D.   On: 10/18/2019 10:27   CT HEAD WO CONTRAST  Result Date: 10/18/2019 CLINICAL DATA:  Neurologic deficit. EXAM: CT HEAD WITHOUT CONTRAST TECHNIQUE: Contiguous axial images were obtained from the base of the skull through the vertex without intravenous contrast. COMPARISON:  None. FINDINGS: Brain: The ventricles, cisterns and other CSF spaces are normal. There is minimal chronic ischemic microvascular disease. There is no mass, mass  effect, shift of midline structures or acute hemorrhage. No evidence of acute infarction. Vascular: No hyperdense vessel or unexpected calcification. Skull: Normal. Negative for fracture or focal lesion. Sinuses/Orbits: No acute finding. Other: None. IMPRESSION: No acute findings. Mild chronic ischemic microvascular disease. Electronically Signed   By: Marin Olp M.D.   On: 10/18/2019 14:54   DG Chest Portable 1 View  Result Date: 10/17/2019 CLINICAL DATA:  Shortness of breath. Generalized weakness. Bilateral leg swelling and numbness. EXAM: PORTABLE CHEST 1 VIEW COMPARISON:  11/17/2014. FINDINGS: Enlarged cardiac silhouette with an interval significant increase in size. Interval mild linear density in the left mid and lower lung zones. Progressively prominent interstitial markings with no Kerley lines or pleural fluid seen. Aortic arch calcifications. No acute bony abnormalities. IMPRESSION: 1. Interval cardiomegaly. 2. Interval mild chronic interstitial lung disease or minimal interstitial pulmonary edema. 3. Interval mild linear atelectasis or scarring in the left mid and lower lung zones. Electronically Signed   By: Claudie Revering M.D.   On: 10/17/2019 12:21   ECHOCARDIOGRAM COMPLETE  Result Date: 10/18/2019    ECHOCARDIOGRAM REPORT   Patient Name:   MARYKATHLEEN  [place] Small Date of Exam: 10/18/2019 Medical Rec #:  [id number]            Height:       67.0 in Accession #:    [id number]           Weight:       351.2 lb Date of Birth:  10/18/1950            BSA:          2.569 m Patient Age:    41 years             BP:           125/62 mmHg Patient Gender: F                    HR:           95 bpm. Exam Location:  Inpatient Procedure: 2D Echo, Cardiac Doppler, Color Doppler and Intracardiac            Opacification Agent Indications:    CHF-Acute Systolic 660.63 / K16.01  History:        Patient has no prior history of Echocardiogram examinations.                 CHF; Risk Factors:Hypertension and Current Smoker.  GERD.  Sonographer:    Vickie Epley RDCS Referring Phys: [id number] Emeterio Reeve  Sonographer Comments: Patient is morbidly obese and Technically difficult study due to poor echo windows. IMPRESSIONS  1. Left ventricular ejection fraction, by estimation, is 60 to 65%. The left ventricle has normal function. The left ventricle has no regional wall motion abnormalities. Left ventricular diastolic parameters are indeterminate.  2. Right ventricular systolic function is normal. The right ventricular size is not well visualized.  3. The mitral valve is grossly normal. Trivial mitral valve regurgitation. No evidence of mitral stenosis.  4. The aortic valve was not well visualized. Aortic valve regurgitation is not visualized. No aortic stenosis is present.  5. The inferior vena cava is dilated in size with <50% respiratory variability, suggesting right atrial pressure of 15 mmHg. Comparison(s): No prior Echocardiogram. Conclusion(s)/Recommendation(s): Technically difficult study, even with use of echo contrast. LV function normal. Difficult to see RV, but in some views appears it may be mildly-moderately enlarged. IVC dilated and does not collapse, consistent with elevated right atrial pressure. FINDINGS  Left Ventricle: Left ventricular ejection fraction, by estimation, is 60 to 65%. The left ventricle has normal function. The left ventricle has no regional wall motion abnormalities. Definity contrast agent was given IV to delineate the left ventricular  endocardial borders. The left ventricular internal cavity size was normal in size. There is no left ventricular hypertrophy. Left ventricular diastolic parameters are indeterminate. Right Ventricle: The right ventricular size is not well visualized. Right vetricular wall thickness was not assessed. Right ventricular systolic function is normal. Left Atrium: Left atrial size was normal in size. Right Atrium: Right atrial size was not well visualized. Pericardium: There  is no evidence of pericardial effusion. Presence of pericardial fat pad. Mitral Valve: The mitral valve is grossly normal. Trivial mitral valve regurgitation. No evidence of mitral valve stenosis. Tricuspid Valve: The tricuspid valve is grossly normal. Tricuspid valve regurgitation is trivial. No evidence of tricuspid stenosis. Aortic Valve: The aortic valve was not well visualized. Aortic valve regurgitation is not visualized. No aortic stenosis is present. Pulmonic Valve: The pulmonic valve was not well visualized. Pulmonic valve regurgitation is not visualized. No evidence  of pulmonic stenosis. Aorta: The aortic root was not well visualized, the ascending aorta was not well visualized and the aortic arch was not well visualized. Pulmonary Artery: The pulmonary artery is not well seen. Venous: The inferior vena cava is dilated in size with less than 50% respiratory variability, suggesting right atrial pressure of 15 mmHg. IAS/Shunts: The interatrial septum was not well visualized.  LEFT VENTRICLE PLAX 2D LVOT diam:     2.30 cm      Diastology LV SV:         92           LV e' lateral:   8.70 cm/s LV SV Index:   36           LV E/e' lateral: 11.2 LVOT Area:     4.15 cm     LV e' medial:    5.78 cm/s                             LV E/e' medial:  16.8  LV Volumes (MOD) LV vol d, MOD A2C: 102.0 ml LV vol d, MOD A4C: 84.0 ml LV vol s, MOD A2C: 32.6 ml LV vol s, MOD A4C: 31.3 ml LV SV MOD A2C:     69.4 ml LV SV MOD A4C:     84.0 ml LV SV MOD BP:      63.0 ml RIGHT VENTRICLE RV S prime:     14.60 cm/s TAPSE (M-mode): 2.2 cm LEFT ATRIUM             Index       RIGHT ATRIUM           Index LA Vol (A2C):   21.5 ml 8.37 ml/m  RA Area:     16.40 cm LA Vol (A4C):   26.2 ml 10.20 ml/m RA Volume:   45.20 ml  17.59 ml/m LA Biplane Vol: 25.5 ml 9.93 ml/m  AORTIC VALVE LVOT Vmax:   116.00 cm/s LVOT Vmean:  78.000 cm/s LVOT VTI:    0.222 m MITRAL VALVE MV Area (PHT): 2.79 cm     SHUNTS MV Decel Time: 272 msec     Systemic VTI:   0.22 m MV E velocity: 97.30 cm/s   Systemic Diam: 2.30 cm MV A velocity: 110.00 cm/s MV E/A ratio:  0.88 Buford Dresser MD Electronically signed by Buford Dresser MD Signature Date/Time: 10/18/2019/1:29:08 PM    Final       Scheduled Meds: . anastrozole  1 mg Oral Daily  . aspirin  81 mg Oral Daily  . cephALEXin  500 mg Oral Q12H  . chlorhexidine  15 mL Mouth Rinse BID  . Chlorhexidine Gluconate Cloth  6 each Topical Daily  . cilostazol  100 mg Oral BID  . enoxaparin (LOVENOX) injection  60 mg Subcutaneous Daily  . furosemide  40 mg Intravenous BID  . mouth rinse  15 mL Mouth Rinse q12n4p  . metoprolol succinate  12.5 mg Oral Daily  . multivitamin  1 tablet Oral Daily  . nicotine  7 mg Transdermal Daily  . pantoprazole  20 mg Oral Daily  . sodium chloride flush  3 mL Intravenous Q12H   Continuous Infusions: . sodium chloride       LOS: 2 days    Time spent: [place], MD [facility]   To contact the attending provider between 7A-7P or the covering provider during after  hours 7P-7A, please log into the web site [email or web address] and access using universal [place] password for that web site. If you do not have the password, please call the hospital operator.  10/19/2019, 7:22 AM

## 2019-10-19 NOTE — Progress Notes (Signed)
   10/19/19 1000  Clinical Encounter Type  Visited With Patient and family together  Visit Type Initial;Psychological support;Spiritual support  Referral From Nurse  Consult/Referral To Chaplain  Spiritual Encounters  Spiritual Needs Brochure;Emotional;Other (Comment) (Advance Directive/Spiritual Care Conversation)  Stress Factors  Patient Stress Factors Health changes;Other (Comment)  Family Stress Factors Health changes;Other (Comment)  Advance Directives (For Healthcare)  Does Patient Have a Medical Advance Directive? No  Would patient like information on creating a medical advance directive? No - Patient declined  [place]  Does Patient Have a Mental Health Advance Directive? No  Would patient like information on creating a mental health advance directive? 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  I provided a copy of the Advance Directive and education. After speaking with Christyanna and her daughters, she declined to complete the document. Jacoba knows that her daughters will make the right decisions for her if she is unable to.  I also provided emotional and spiritual support.   Please, contact Spiritual Care for further assistance.   Chaplain Shanon Ace M.Div., [facility]

## 2019-10-19 NOTE — Progress Notes (Signed)
CRITICAL VALUE ALERT  Critical Value:  PCO2 - 81.2  Date & Time Notied: 10/19/2019 @1550   Provider Notified: Dr. Garrel Ridgel  Notified by Jinny Blossom,  RT  Orders Received/Actions taken: Continue current treatment

## 2019-10-20 ENCOUNTER — Encounter (HOSPITAL_COMMUNITY): Payer: Self-pay | Admitting: Osteopathic Medicine

## 2019-10-20 ENCOUNTER — Inpatient Hospital Stay (HOSPITAL_COMMUNITY): Payer: Medicare PPO

## 2019-10-20 DIAGNOSIS — G4733 Obstructive sleep apnea (adult) (pediatric): Secondary | ICD-10-CM | POA: Diagnosis not present

## 2019-10-20 DIAGNOSIS — I2609 Other pulmonary embolism with acute cor pulmonale: Secondary | ICD-10-CM

## 2019-10-20 DIAGNOSIS — E662 Morbid (severe) obesity with alveolar hypoventilation: Secondary | ICD-10-CM | POA: Diagnosis not present

## 2019-10-20 DIAGNOSIS — G9341 Metabolic encephalopathy: Secondary | ICD-10-CM

## 2019-10-20 DIAGNOSIS — J9621 Acute and chronic respiratory failure with hypoxia: Secondary | ICD-10-CM

## 2019-10-20 DIAGNOSIS — J9622 Acute and chronic respiratory failure with hypercapnia: Secondary | ICD-10-CM

## 2019-10-20 LAB — BASIC METABOLIC PANEL
Anion gap: 12 (ref 5–15)
BUN: 21 mg/dL (ref 8–23)
CO2: 40 mmol/L — ABNORMAL HIGH (ref 22–32)
Calcium: 8.8 mg/dL — ABNORMAL LOW (ref 8.9–10.3)
Chloride: 86 mmol/L — ABNORMAL LOW (ref 98–111)
Creatinine, Ser: 1.43 mg/dL — ABNORMAL HIGH (ref 0.44–1.00)
GFR calc Af Amer: 43 mL/min — ABNORMAL LOW (ref >60)
GFR calc non Af Amer: 37 mL/min — ABNORMAL LOW (ref >60)
Glucose, Bld: 98 mg/dL (ref 70–99)
Potassium: 2.8 mmol/L — ABNORMAL LOW (ref 3.5–5.1)
Sodium: 138 mmol/L (ref 135–145)

## 2019-10-20 LAB — GLUCOSE, CAPILLARY: Glucose-Capillary: 116 mg/dL — ABNORMAL HIGH (ref 70–99)

## 2019-10-20 LAB — MAGNESIUM: Magnesium: 1.9 mg/dL (ref 1.7–2.4)

## 2019-10-20 MED ORDER — POTASSIUM CHLORIDE CRYS ER 20 MEQ PO TBCR
20.0000 meq | EXTENDED_RELEASE_TABLET | Freq: Two times a day (BID) | ORAL | Status: DC
  Administered 2019-10-20: 12:00:00 20 meq via ORAL
  Filled 2019-10-20: qty 1

## 2019-10-20 MED ORDER — ACETAZOLAMIDE 250 MG PO TABS
250.0000 mg | ORAL_TABLET | Freq: Two times a day (BID) | ORAL | Status: DC
  Administered 2019-10-20 (×2): 250 mg via ORAL
  Filled 2019-10-20 (×3): qty 1

## 2019-10-20 MED ORDER — POTASSIUM CHLORIDE 10 MEQ/100ML IV SOLN
10.0000 meq | INTRAVENOUS | Status: AC
  Administered 2019-10-20 (×4): 10 meq via INTRAVENOUS
  Filled 2019-10-20 (×4): qty 100

## 2019-10-20 MED ORDER — CEPHALEXIN 250 MG/5ML PO SUSR
500.0000 mg | Freq: Two times a day (BID) | ORAL | Status: DC
  Administered 2019-10-20 – 2019-10-21 (×3): 500 mg via ORAL
  Filled 2019-10-20 (×4): qty 10

## 2019-10-20 NOTE — Progress Notes (Signed)
  Speech Language Pathology Treatment: Dysphagia  Patient Details Name: Daisy [name] MRN: [id number] DOB: 11-10-1950 Today's Date: 10/20/2019 Time: 1320-1340 SLP Time Calculation (min) (ACUTE ONLY): 20 min  Assessment / Plan / Recommendation Clinical Impression  Pt seen at bedside for follow up after esophagram completed earlier today. Pt's 2 daughters were present. SLP provided written behavioral and dietary strategies for management of esophageal dysmotility. This information was reviewed with pt and her daughters, and they were provided with the opportunity to ask questions. These were answered to pt/family satisfaction. Pt was observed during lunch (dys2 solids, thin liquids), and was encouraged to adhere to esophageal precautions:  Behavioral and Dietary Strategies for Management of Esophageal Dysmotility 1. Take reflux medications 30+ minutes before other po intake, in the morning 2. Begin meals with warm beverage 3. Eat smaller more frequent meals 4. Eat slowly, taking small bites and sips 5. Alternate solids and liquids 6. Avoid foods/liquids that increase acid production 7. Sit upright during and for 30+ minutes after meals to facilitate esophageal clearing   HPI HPI: Daisy [name] is a 69 y.o. female with a history of OHS, GERD, breast cancer, hypertension, hyperlipidemia, PVD. Patient presented secondary to fatigue and dyspnea on exertion. Concern on admission for acute heart failure.  evidence of severe hypercarbia on ABG with associated acidemia. Patient with acute, uncompensated respiratory acidosis. Pt was put on bipap.      SLP Plan  Continue with current plan of care - follow up once more for further questions/needs.       Recommendations  Diet recommendations: Dysphagia 2 (fine chop);Thin liquid Liquids provided via: Cup;No straw Medication Administration: Crushed with puree Supervision: Patient able to self feed Compensations: Slow rate;Small  sips/bites;Follow solids with liquid;Minimize environmental distractions Postural Changes and/or Swallow Maneuvers: Seated upright 90 degrees;Upright 30-60 min after meal                Oral Care Recommendations: Oral care BID Follow up Recommendations: None SLP Visit Diagnosis: Dysphagia, unspecified (R13.10) Plan: Continue with current plan of care       GO             Tailey Top B. Quentin Ore, [facility], [place] Speech Language Pathologist Office: [phone] Pager: [phone]   Shonna Chock 10/20/2019, 2:23 PM

## 2019-10-20 NOTE — Consult Note (Signed)
NAME:  Daisy [name], MRN:  [id number], DOB:  09-18-1950, LOS: 3 ADMISSION DATE:  10/17/2019, CONSULTATION DATE: 5/6 REFERRING MD:  Verlon Au, CHIEF COMPLAINT:  Pulmonary hypertension type III, sleep apnea.  With acute decompensated diastolic heart failure  Brief History   69 year old female admitted on 5/3 with fatigue, swelling, exertional dyspnea, and hypercarbia.  Admitted with working diagnosis of decompensated diastolic heart failure and cor pulmonale in the setting of untreated OSA.  Improved with positive pressure ventilation and IV diuresis Pulmonary asked to evaluate on 5/6 for further recommendations going forward  History of present illness   This is a 69 year old female who was admitted on 5/3 with chief complaint of fatigue and shortness of breath.  She had recently received Covid vaccination felt initially this was a contributing factor however on further questioning she endorsed slowly progressive exertional fatigue, lower extremity edema and exertional dyspnea.  Initial chest x-ray showed cardiomegaly with mild pulmonary edema.  Was hyponatremic with sodium of 128, new kidney failure with serum creatinine of 1.65, BNP of 769.5 normal serum osmolality.  Later that morning arterial blood gas: pH 7.17, PCO2 108, bicarbonate 38, saturations 89%.  She was placed on noninvasive positive pressure ventilation, administered IV Lasix, and admitted to the stepdown unit.  Serial blood gases demonstrated improvement, as did consciousness. As of 5/6 she is continuing to make improvement, supplemental oxygen has been weaned down successfully, now down to 6 L via nasal cannula, most recent PCO2 on 5/5 showed reading of 81.2 down from 108 with pH proved to 7.33.  An intake output now 7 L negative.  Echocardiogram obtained shortly after admission suggested Increased right ventricular but intact LV function.  As this appeared to be acute on chronic hypoxic and hypercarbic respiratory failure likely due  to untreated OSA and possibly obesity hypoventilation syndrome pulmonary was asked to evaluate for further suggestions.   Past Medical History  Morbid obesity, chronic dyspnea, left breast cancer status post radiation, peripheral vascular disease, depression, arthritis, acid reflux disease Significant Hospital Events   5/4 admitted w/ decompensated HF-->treated BIPAP and lasix 5/6 improved w/ rx as above ->PCCM asked to see re: further treatment rec.    Consults:  pulm 5/6  Procedures:    Significant Diagnostic Tests:  CT 5/4: Mild chronic ischemic microvascular disease. Micro Data:    Antimicrobials:    Interim history/subjective:  Feels better   Objective   Blood pressure 107/77, pulse 97, temperature 97.9 F (36.6 C), temperature source Axillary, resp. rate 18, height 5\' 6"  (1.676 m), weight (Abnormal) 153.4 kg, SpO2 92 %.    Vent Mode: BIPAP FiO2 (%):  [40 %] 40 % Set Rate:  [12 bmp-15 bmp] 15 bmp PEEP:  [7 cmH20] 7 cmH20   Intake/Output Summary (Last 24 hours) at 10/20/2019 1421 Last data filed at 10/20/2019 1300 Gross per 24 hour  Intake 842.82 ml  Output 2650 ml  Net -1807.18 ml   Filed Weights   10/18/19 0612 10/18/19 1752 10/19/19 0417  Weight: (Abnormal) 159.3 kg (Abnormal) 153.4 kg (Abnormal) 153.4 kg    Examination: General: This is a pleasant 69 year old female she is currently sitting up in bed and in no acute distress able to speak in full sentences HENT: Mucous membranes are moist there is no jugular venous distention sclera nonicteric Lungs: Clear to auscultation diminished bilateral bases no accessory use noted currently 6 L nasal cannula Cardiovascular: Distant regular rate and rhythm without audible murmur rub or gallop Abdomen: Soft nontender no organomegaly  Extremities: Trace lower extremity edema, note this is improved since hospital admission Neuro: Awake oriented no focal weakness.  Does exhibit occasional asterixis GU: Clear yellow  urine  Resolved Hospital Problem list   Hyponatremia  Resolved acute metabolic encephalopathy secondary to hypercarbia Assessment & Plan:   Acute on chronic hypoxic and Hypercarbic respiratory failure  Decompensated Diastolic RF Cor Pulmonale  Pulmonary edema and volume overload  Asterixis  AKI Fluid and electrolyte imbalance: hypokalemia    Discussion  Acute on chronic hypoxic and hypercarbic respiratory failure 2/2 untreated OSA w/ decompensated diastolic HF, pulmonary edema and cor pulmonale.  -she is looking better from a pulm stand-point.  -weaning oxygen now I&O balance -7 liters Plan/rec Cont IV lasix as BP/BUN/cr tolerate Cont Nocturnal NIV for now, but once we feel she is euvolemic will try to transition to CPAP Ideally would be best served if we can get her a CPAP at DC but I don't have a lot of confidence we can do that Will be very important to determine  walking oximetry and supplemental oxygen needs at dc  Will need PSG and sleep medicine follow up    Best practice:  Per primary   Labs   CBC: Recent Labs  Lab 10/17/19 1329  WBC 7.9  NEUTROABS 6.2  HGB 15.1*  HCT 48.7*  MCV 99.4  PLT 710    Basic Metabolic Panel: Recent Labs  Lab 10/17/19 1329 10/18/19 0459 10/19/19 0211 10/20/19 0218  NA 128* 134* 137 138  K 4.5 3.8 3.2* 2.8*  CL 88* 92* 90* 86*  CO2 31 34* 36* 40*  GLUCOSE 117* 117* 93 98  BUN 27* 24* 27* 21  CREATININE 1.65* 1.63* 1.61* 1.43*  CALCIUM 9.1 8.9 8.6* 8.8*  MG  --   --  2.1 1.9   GFR: Estimated Creatinine Clearance: 56.8 mL/min (A) (by C-G formula based on SCr of 1.43 mg/dL (H)). Recent Labs  Lab 10/17/19 1329  WBC 7.9    Liver Function Tests: Recent Labs  Lab 10/17/19 1329  AST 18  ALT 28  ALKPHOS 100  BILITOT 0.6  PROT 7.7  ALBUMIN 3.7   No results for input(s): LIPASE, AMYLASE in the last 168 hours. No results for input(s): AMMONIA in the last 168 hours.  ABG    Component Value Date/Time   PHART 7.333  (L) 10/19/2019 1514   PCO2ART 81.2 (HH) 10/19/2019 1514   PO2ART 74.7 (L) 10/19/2019 1514   HCO3 42.0 (H) 10/19/2019 1514   O2SAT 94.8 10/19/2019 1514     Coagulation Profile: No results for input(s): INR, PROTIME in the last 168 hours.  Cardiac Enzymes: No results for input(s): CKTOTAL, CKMB, CKMBINDEX, TROPONINI in the last 168 hours.  HbA1C: No results found for: HGBA1C  CBG: Recent Labs  Lab 10/19/19 2117  GLUCAP 89    Review of Systems:   Review of Systems  Constitutional: Negative.   HENT: Negative.   Eyes: Negative.   Respiratory: Positive for shortness of breath.   Cardiovascular: Positive for leg swelling.  Gastrointestinal: Negative.   Genitourinary: Negative.   Musculoskeletal: Negative.   Skin: Negative.   Neurological: Negative.   Endo/Heme/Allergies: Negative.   Psychiatric/Behavioral: Negative.      Past Medical History  She,  has a past medical history of Acid reflux, Arthritis, Cholecystitis, Depression, Dyspnea, History of radiation therapy (01/20/17-03/02/17), Hyperlipemia, Irregular heart rate, Malignant neoplasm of upper-outer quadrant of left breast in female, estrogen receptor positive ([place]) (09/30/2016), Obesity, Peripheral vascular disease ([place]),  and Pneumonia.   Surgical History    Past Surgical History:  Procedure Laterality Date  . ABDOMINAL HYSTERECTOMY    . BREAST LUMPECTOMY Left 11/19/2016   BREAST LUMPECTOMY WITH RADIOACTIVE SEED AND SENTINEL LYMPH NODE BIOPSY (Left)  . BREAST LUMPECTOMY WITH RADIOACTIVE SEED AND SENTINEL LYMPH NODE BIOPSY Left 11/19/2016   Procedure: BREAST LUMPECTOMY WITH RADIOACTIVE SEED AND SENTINEL LYMPH NODE BIOPSY;  Surgeon: Rolm Bookbinder, MD;  Location: [facility];  Service: General;  Laterality: Left;  . CESAREAN SECTION    . CESAREAN SECTION     x 2  . CHOLECYSTECTOMY  2007  . KNEE ARTHROSCOPY     bilateral  . KNEE SURGERY Bilateral   . LOWER EXTREMITY ANGIOGRAM N/A 04/04/2014   Procedure: LOWER EXTREMITY  ANGIOGRAM;  Surgeon: Laverda Page, MD;  Location: [facility] CATH LAB;  Service: Cardiovascular;  Laterality: N/A;  . OVARIAN CYST REMOVAL    . RE-EXCISION OF BREAST LUMPECTOMY Left 12/12/2016   Procedure: RE-EXCISION OF LEFT BREAST LUMPECTOMY;  Surgeon: Rolm Bookbinder, MD;  Location: [facility];  Service: General;  Laterality: Left;  . ROTATOR CUFF REPAIR Right   . TONSILLECTOMY    . WRIST SURGERY Bilateral      Social History   reports that she has been smoking e-cigarettes. She has a 15.00 pack-year smoking history. She has never used smokeless tobacco. She reports that she does not drink alcohol or use drugs.   Family History   Her family history includes Heart disease in her brother, father, and mother; Lung cancer in her father.   Allergies Allergies  Allergen Reactions  . Levaquin [Levofloxacin] Other (See Comments)    Body aches  . Sulfur Hives     Home Medications  Prior to Admission medications   Medication Sig Start Date End Date Taking? Authorizing Provider  albuterol (PROAIR HFA) 108 (90 BASE) MCG/ACT inhaler Inhale 2 puffs into the lungs every 6 (six) hours as needed for wheezing or shortness of breath.   Yes [provider]  anastrozole (ARIMIDEX) 1 MG tablet Take 1 tablet (1 mg total) by mouth daily. 08/31/19  Yes Magrinat, Virgie Dad, MD  aspirin 81 MG chewable tablet Chew 81 mg by mouth daily.   Yes [provider]  cilostazol (PLETAL) 100 MG tablet Take 1 tablet (100 mg total) by mouth 2 (two) times daily. 03/08/19  Yes Adrian Prows, MD  diclofenac sodium (VOLTAREN) 1 % GEL Apply 4 g topically 4 (four) times daily as needed (knee and back pain). Knees and back for pain 02/08/17  Yes [provider]  Multiple Vitamins-Minerals (PRESERVISION AREDS 2 PO) Take 1 tablet by mouth daily.    Yes [provider]  naproxen sodium (ANAPROX) 220 MG tablet Take 220 mg by mouth 2 (two) times daily as needed (pain).   Yes [provider]    pantoprazole (PROTONIX) 20 MG tablet Take 20 mg by mouth daily.   Yes [provider]  Tetrahydroz-Dextran-PEG-Povid (EYE DROPS ADVANCED RELIEF) 0.05-0.1-1-1 % SOLN Place 1 drop into both eyes 2 (two) times daily.    Yes [provider]  triamterene-hydrochlorothiazide (MAXZIDE-25) 37.5-25 MG tablet TAKE 1 TABLET BY MOUTH EVERY DAY Patient taking differently: Take 1 tablet by mouth daily.  12/06/18  Yes Adrian Prows, MD     Critical care time: na     Erick Colace ACNP-BC [place] Pager # [phone] OR # [phone] if no answer

## 2019-10-20 NOTE — Progress Notes (Signed)
Patient off bipap at this time per her request to take a break. Patient on 6L nasal cannula.

## 2019-10-20 NOTE — Progress Notes (Signed)
Paged Sharlet Salina, NP via [facility] regarding K+ 2.8

## 2019-10-20 NOTE — Progress Notes (Signed)
While in room with patient and pulling her up in bed patient's daughter [name] called and wanted to speak with the nurse.  This RN spoke with [name] and [name] asked the RN what was going on? RN asked what she meant.  [name] explained that her mother had called her and told her that no one would come put mask on her and that she had been laying in the bed wet.  This RN explained to [place] that she had checked the patient twice and that the NT [place] had checked the patient and that the patient was not wet any of the times we had checked her bed pad.  RN explained to [name] that her mother may feel wet because the [place] will feel moist itself because it is the device that catches the urine for it to be sucked into the canister and that this may be what she is feeling. Patient was just pulled up in bed and bed pan checked and is dry.  RN then explained to [place] as it had already been explained to the patient that after she takes her bed time medicines we can then put the bipap mask on.  RN explained to the patient that one of her medicines had to be changed to liquid because it could not be crushed so that the nurse is waiting on pharmacy to send the keflex.  [name] stated that her mom had wanted her bipap mask on earlier after she had taken her medicines while day shift was there. This RN explained to [place] that this RN had received in report that patient had wanted her mask on and that soon after it was put on that patient took it off herself. Patient has been on 6 L nasal cannula since start of night shift at 1900 with no shortness of breath and good oxygenation.

## 2019-10-20 NOTE — Progress Notes (Signed)
SLP Consult Note  Patient Details Name: Daisy [name] MRN: [id number] DOB: 1951-03-29   RN called SLP regarding diet recommendations following esophageal study today. Based on 10/19/19 BSE results and recommendations in addition to 10/20/19 esophageal study results (specifically "42mm barium tablet remained within distal esophagus for a prolonged period of time"), recommend Dys 2 diet, thin liquids, and meds crushed in puree.  SLP will follow up for education.   Thao Vanover B. [facility], [facility], [place] Speech Language Pathologist Office: [phone] Pager: [phone]  Shonna Chock 10/20/2019, 12:59 PM

## 2019-10-20 NOTE — Progress Notes (Signed)
PROGRESS NOTE    Daisy [name]  EXN:[id number] DOB: March 04, 1951 DOA: 10/17/2019 PCP: Alvester Chou, NP    Chief Complaint  Patient presents with  . Shortness of Breath  . Weakness  . Leg Swelling    Brief Narrative:  74 white female estrogen receptor + breast cancer (Dr. Knox Royalty post L lumpectomy/adjuvant XRT 2018 Tobacco + PAD (L duplex 07/17/2015 L ABI 0.68 R ABI 0.61) Class III severe obesity BMI 54 Reflux HTN-Home meds do not include any diuretics Admit 10/17/2019 cramping hands feet (recently received coronavirus 19 vaccination) Circumstantial evidence = DOE, increased fatigue, increased L LE edema CXR = cardiac enlargement, CHF Lasix started in ED, meds adjusted     Assessment & Plan:   Active Problems:   Claudication in peripheral vascular disease ([place])   Obesity hypoventilation syndrome (HCC)   GERD (gastroesophageal reflux disease)   Acute heart failure (HCC)   History of breast cancer   AKI (acute kidney injury) ([place])   Essential hypertension   Slurred speech  Cardio-pulm AECHF presumed diastolic-BNP on admission 769 echocardiogram is pending OHSS?  Sleep apnea given habitus-pulmonary hypertension type III Events 5/4 PM noted-patient placed on BiPAP sent to stepdown unit ABG = 7.17 initially now improved to 7.21-likely both a severe restrictive obstructive pattern  Serial ABG have shown improvement Diurese Lasix IV 40 twice daily-cautious continuation metoprolol XL 12.5 Discontinue scheduled Naprosyn [name] has been held from admission  I did discuss with critical care Dr. Carlis Abbott on 5/5 patient scenario--patient has clinically improved work of breathing is decreased and is now on 6 L satting well I will asked them to follow-up with #1 discussions regarding BiPAP titration and #2 need for eventual tracheotomy Will need BiPAP whenever asleep   Renal Likely cardiorenal syndrome with AKI-Baseline creatinine 08/03/2016 Hypervolemic  hyponatremia on admission Osmolality is near normal at 283-hold on urine studies given resolution of hyponatremia -Confounding factor is BMI of 50 Estimated Creatinine Clearance: 56.8 mL/min (A) (by C-G formula based on SCr of 1.43 mg/dL (H)). Hyponatremia resolved with continued diuresis--need to continue diuresing  Bicarb climbing steadily to 40, potassium down from 3.2-2.8-replace aggressively overnight 4 runs given, Adding Diamox 250 twice daily BUN/creatinine stable 27/1.6 Severe hypokalemia 2.8 Replaced aggressively as above likely secondary to acid-base disturbance which will correct eventually   FEN Class III morbid obesity BMI >50 Continue volume restriction, education regarding diet, may benefit from further input as an outpatient bariatrics   DVT prophylaxis: Lovenox Code Status: Full Family Communication: Long discussion with both family daughters at the bedside  Disposition: We will need to stay on stepdown until her acute respiratory issues have resolved  Status is: Inpatient  Remains inpatient appropriate because:Hemodynamically unstable, Persistent severe electrolyte disturbances and Unsafe d/c plan   Dispo: The patient is from: Home              Anticipated d/c is to: SNF              Anticipated d/c date is: > 3 days              Patient currently is not medically stable to d/c.    Consultants:   Curbside pulmonology  Procedures: None  Antimicrobials: No   Subjective: More comfortable SLP MBS accomplished--now on Dys Diet and tol some Pills apparently got "stuck" No cp no fever coherent in nad Daughter at bedside  Objective: Vitals:   10/20/19 0809 10/20/19 0900 10/20/19 1000 10/20/19 1100  BP:  136/71 (!) 101/43 Marland Kitchen)  107/43  Pulse:  97 89 90  Resp:  20 (!) 25 20  Temp: 97.9 F (36.6 C)     TempSrc: Axillary     SpO2:  97% 94% 96%  Weight:      Height:        Intake/Output Summary (Last 24 hours) at 10/20/2019 1246 Last data filed at  10/20/2019 1134 Gross per 24 hour  Intake 482.82 ml  Output 2650 ml  Net -2167.18 ml   Filed Weights   10/18/19 0612 10/18/19 1752 10/19/19 0417  Weight: (!) 159.3 kg (!) 153.4 kg (!) 153.4 kg    Examination:  General exam: Awake on 6 l O2 Respiratory system: Poor exam limited secondary to BiPAP/massive habitus Cardiovascular system: S1-S2 no murmur poor exam Gastrointestinal system: Obese nontender nondistended no rebound no guarding. Central nervous system: Intact no focal deficit Extremities: Mild lower extremity edema- Skin: As above Psychiatry: Flat    Data Reviewed: I have personally reviewed following labs and imaging studies Potassium 3.2--->2.8 bicarb 36--->40 BUN/creatinine 27/1.6--->21/1.43  TSH 0.26   Radiology Studies: CT HEAD WO CONTRAST  Result Date: 10/18/2019 CLINICAL DATA:  Neurologic deficit. EXAM: CT HEAD WITHOUT CONTRAST TECHNIQUE: Contiguous axial images were obtained from the base of the skull through the vertex without intravenous contrast. COMPARISON:  None. FINDINGS: Brain: The ventricles, cisterns and other CSF spaces are normal. There is minimal chronic ischemic microvascular disease. There is no mass, mass effect, shift of midline structures or acute hemorrhage. No evidence of acute infarction. Vascular: No hyperdense vessel or unexpected calcification. Skull: Normal. Negative for fracture or focal lesion. Sinuses/Orbits: No acute finding. Other: None. IMPRESSION: No acute findings. Mild chronic ischemic microvascular disease. Electronically Signed   By: Marin Olp M.D.   On: 10/18/2019 14:54   DG ESOPHAGUS W SINGLE CM (SOL OR THIN BA)  Result Date: 10/20/2019 CLINICAL DATA:  Acid reflux. Additional history obtained from patient: Patient denies regurgitation of fluid or liquids at time of exam. EXAM: ESOPHOGRAM / BARIUM SWALLOW / BARIUM TABLET STUDY TECHNIQUE: A single contrast examination was performed using thin barium liquid. The patient was  observed with fluoroscopy swallowing a 13 mm barium sulphate tablet. FLUOROSCOPY TIME:  Fluoroscopy Time:  3 minutes Radiation Exposure Index (if provided by the fluoroscopic device): 32.90 mGy Number of Acquired Spot Images: 4 COMPARISON:  CT abdomen/pelvis 03/14/2016, chest radiograph 10/18/2018 FINDINGS: Limited examination due to limited patient positioning. A single contrast study was performed. There is a left lateral contrast outpouching at the level of the upper cervical esophagus suspicious for Dolores Frame diverticulum (see and rotations on images). Elsewhere, there is no evidence of esophageal mass or mucosal abnormality. There is intermittent esophageal dysmotility. There is a small sliding hiatal hernia. Small volume gastroesophageal reflux to the level of the lower esophagus. A swallowed 13 mm barium tablet remained within the distal esophagus and did not pass into the stomach despite a prolonged period of observation. IMPRESSION: 1. 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 2. Intermittent esophageal dysmotility. 3. Small volume gastroesophageal reflux to the level of the lower esophagus. 4. Small sliding hiatal hernia. 5. A 13 mm barium tablet remained within the distal esophagus and did not pass into the stomach despite a prolonged period of observation. A mild smooth distal esophageal stricture cannot be excluded. Electronically Signed   By: Kellie Simmering DO   On: 10/20/2019 09:51      Scheduled Meds: . anastrozole  1 mg  Oral Daily  . aspirin  81 mg Oral Daily  . cephALEXin  500 mg Oral Q12H  . chlorhexidine  15 mL Mouth Rinse BID  . Chlorhexidine Gluconate Cloth  6 each Topical Daily  . cilostazol  100 mg Oral BID  . enoxaparin (LOVENOX) injection  60 mg Subcutaneous Daily  . furosemide  40 mg Intravenous BID  . mouth rinse  15 mL Mouth Rinse q12n4p  . metoprolol succinate  12.5 mg Oral Daily  . multivitamin  1 tablet  Oral Daily  . nicotine  7 mg Transdermal Daily  . pantoprazole  20 mg Oral Daily  . potassium chloride  20 mEq Oral BID  . potassium chloride  40 mEq Oral Daily  . sodium chloride flush  3 mL Intravenous Q12H   Continuous Infusions: . sodium chloride       LOS: 3 days    Time spent: [place], MD [facility]   To contact the attending provider between 7A-7P or the covering provider during after hours 7P-7A, please log into the web site [email or web address] and access using universal [place] password for that web site. If you do not have the password, please call the hospital operator.  10/20/2019, 12:46 PM

## 2019-10-21 DIAGNOSIS — E662 Morbid (severe) obesity with alveolar hypoventilation: Secondary | ICD-10-CM | POA: Diagnosis not present

## 2019-10-21 DIAGNOSIS — J810 Acute pulmonary edema: Secondary | ICD-10-CM | POA: Diagnosis not present

## 2019-10-21 DIAGNOSIS — J9601 Acute respiratory failure with hypoxia: Secondary | ICD-10-CM

## 2019-10-21 DIAGNOSIS — I2609 Other pulmonary embolism with acute cor pulmonale: Secondary | ICD-10-CM | POA: Diagnosis not present

## 2019-10-21 DIAGNOSIS — J9602 Acute respiratory failure with hypercapnia: Secondary | ICD-10-CM

## 2019-10-21 LAB — MAGNESIUM: Magnesium: 1.7 mg/dL (ref 1.7–2.4)

## 2019-10-21 LAB — CBC WITH DIFFERENTIAL/PLATELET
Abs Immature Granulocytes: 0.02 10*3/uL (ref 0.00–0.07)
Basophils Absolute: 0 10*3/uL (ref 0.0–0.1)
Basophils Relative: 0 %
Eosinophils Absolute: 0.1 10*3/uL (ref 0.0–0.5)
Eosinophils Relative: 2 %
HCT: 46.2 % — ABNORMAL HIGH (ref 36.0–46.0)
Hemoglobin: 13.8 g/dL (ref 12.0–15.0)
Immature Granulocytes: 0 %
Lymphocytes Relative: 17 %
Lymphs Abs: 1.4 10*3/uL (ref 0.7–4.0)
MCH: 30.1 pg (ref 26.0–34.0)
MCHC: 29.9 g/dL — ABNORMAL LOW (ref 30.0–36.0)
MCV: 100.9 fL — ABNORMAL HIGH (ref 80.0–100.0)
Monocytes Absolute: 0.8 10*3/uL (ref 0.1–1.0)
Monocytes Relative: 11 %
Neutro Abs: 5.4 10*3/uL (ref 1.7–7.7)
Neutrophils Relative %: 70 %
Platelets: 221 10*3/uL (ref 150–400)
RBC: 4.58 MIL/uL (ref 3.87–5.11)
RDW: 14.9 % (ref 11.5–15.5)
WBC: 7.8 10*3/uL (ref 4.0–10.5)
nRBC: 0 % (ref 0.0–0.2)

## 2019-10-21 LAB — COMPREHENSIVE METABOLIC PANEL
ALT: 19 U/L (ref 0–44)
AST: 14 U/L — ABNORMAL LOW (ref 15–41)
Albumin: 3.2 g/dL — ABNORMAL LOW (ref 3.5–5.0)
Alkaline Phosphatase: 74 U/L (ref 38–126)
Anion gap: 12 (ref 5–15)
BUN: 22 mg/dL (ref 8–23)
CO2: 42 mmol/L — ABNORMAL HIGH (ref 22–32)
Calcium: 8.9 mg/dL (ref 8.9–10.3)
Chloride: 84 mmol/L — ABNORMAL LOW (ref 98–111)
Creatinine, Ser: 1.42 mg/dL — ABNORMAL HIGH (ref 0.44–1.00)
GFR calc Af Amer: 44 mL/min — ABNORMAL LOW (ref >60)
GFR calc non Af Amer: 38 mL/min — ABNORMAL LOW (ref >60)
Glucose, Bld: 111 mg/dL — ABNORMAL HIGH (ref 70–99)
Potassium: 3.1 mmol/L — ABNORMAL LOW (ref 3.5–5.1)
Sodium: 138 mmol/L (ref 135–145)
Total Bilirubin: 1.1 mg/dL (ref 0.3–1.2)
Total Protein: 6.5 g/dL (ref 6.5–8.1)

## 2019-10-21 LAB — PHOSPHORUS: Phosphorus: 2.1 mg/dL — ABNORMAL LOW (ref 2.5–4.6)

## 2019-10-21 MED ORDER — POTASSIUM CHLORIDE 10 MEQ/100ML IV SOLN
10.0000 meq | INTRAVENOUS | Status: AC
  Administered 2019-10-21 (×3): 10 meq via INTRAVENOUS
  Filled 2019-10-21 (×3): qty 100

## 2019-10-21 MED ORDER — ACETAZOLAMIDE 250 MG PO TABS
500.0000 mg | ORAL_TABLET | Freq: Two times a day (BID) | ORAL | Status: DC
  Administered 2019-10-21 – 2019-10-22 (×3): 500 mg via ORAL
  Filled 2019-10-21 (×4): qty 2

## 2019-10-21 MED ORDER — PANTOPRAZOLE SODIUM 40 MG PO PACK
20.0000 mg | PACK | Freq: Every day | ORAL | Status: DC
  Administered 2019-10-21 – 2019-10-23 (×3): 20 mg
  Filled 2019-10-21 (×3): qty 20

## 2019-10-21 MED ORDER — POLYETHYLENE GLYCOL 3350 17 G PO PACK
17.0000 g | PACK | Freq: Every day | ORAL | Status: DC | PRN
  Administered 2019-10-24: 17 g via ORAL
  Filled 2019-10-21: qty 1

## 2019-10-21 NOTE — Progress Notes (Addendum)
NAME:  Daisy [name], MRN:  [id number], DOB:  1951-05-11, LOS: 4 ADMISSION DATE:  10/17/2019, CONSULTATION DATE: 5/6 REFERRING MD:  Verlon Au, CHIEF COMPLAINT:  Pulmonary hypertension type III, sleep apnea.  With acute decompensated diastolic heart failure  Brief History   69 year old female admitted on 5/3 with fatigue, swelling, exertional dyspnea, and hypercarbia.  Admitted with working diagnosis of decompensated diastolic heart failure and cor pulmonale in the setting of untreated OSA.  Improved with positive pressure ventilation and IV diuresis Pulmonary asked to evaluate on 5/6 for further recommendations going forward    Past Medical History  Morbid obesity, chronic dyspnea, left breast cancer status post radiation, peripheral vascular disease, depression, arthritis, acid reflux disease Significant Hospital Events   5/4 admitted w/ decompensated HF-->treated BIPAP and lasix 5/6 improved w/ rx as above ->PCCM asked to see re: further treatment rec.  5/7.  Continuing to improve, intake output balance improved.  Still requiring significant oxygen.  Having some difficulty during the p.m. hours with compliance  Consults:  pulm 5/6  Procedures:    Significant Diagnostic Tests:  CT 5/4: Mild chronic ischemic microvascular disease. Micro Data:    Antimicrobials:    Interim history/subjective:  Just removed off from BiPAP  Objective   Blood pressure 138/79, pulse 91, temperature 98.3 F (36.8 C), temperature source Oral, resp. rate (Abnormal) 21, height 5\' 6"  (1.676 m), weight (Abnormal) 151.8 kg, SpO2 90 %.    Vent Mode: BIPAP FiO2 (%):  [40 %] 40 % PEEP:  [5 cmH20] 5 cmH20   Intake/Output Summary (Last 24 hours) at 10/21/2019 0905 Last data filed at 10/21/2019 4270 Gross per 24 hour  Intake 849.25 ml  Output 3175 ml  Net -2325.75 ml   Filed Weights   10/18/19 1752 10/19/19 0417 10/21/19 0500  Weight: (Abnormal) 153.4 kg (Abnormal) 153.4 kg (Abnormal) 151.8 kg     Examination: General 69 year old white female she is resting in bed she is in no distress, however does seem a little bit frustrated this morning HEENT mucous membranes currently dry after removal of BiPAP machine.  Neck veins flat sclera nonicteric Pulmonary diminished bilaterally currently 5 L via nasal cannula Cardiac distant regular rate and rhythm Abdomen is obese, soft, no organomegaly Extremities continue to demonstrate lower extremity edema, brisk capillary refill, pulses palpable Neuro awake oriented appropriate but a little bit frustrated and agitated at times  Resolved Hospital Problem list   Hyponatremia  Resolved acute metabolic encephalopathy secondary to hypercarbia Assessment & Plan:   Acute on chronic hypoxic and Hypercarbic respiratory failure  Decompensated Diastolic RF Cor Pulmonale  Pulmonary edema and volume overload  Asterixis  AKI Fluid and electrolyte imbalance: hypokalemia    Discussion  Acute on chronic hypoxic and hypercarbic respiratory failure 2/2 untreated OSA w/ decompensated diastolic HF, pulmonary edema and cor pulmonale.  -She is now -10 L.  Still requiring supplemental oxygen at at least 5 L to maintain saturations greater than 88%. -Still seems to have some trouble with compliance at at bedtime.  Reading through the notes, I wonder if as she reaches the end of the day she is getting more hypercarbic and possibly a bit more confused.  It appears as though she refused NIV until approximately 3 AM.. But she claims she requested it. Plan/rec Continue IV diuresis as long as BUN/creatinine and blood pressure will tolerate  Continue nocturnal noninvasive ventilation, also use this during the day at time of rest  We will repeat arterial blood gas once we feel  certain she is at euvolemic state, I am beginning to think she may require NIV at time of discharge to ensure adequate treatment of restrictive lung physiology from her obesity and resultant  hypercapnia  I would ensure she places the NIV on at 10 PM each night I would not jump to elective tracheostomy, however I did tell her and her daughter that compliance is going to be very important with NIV, as if she cannot tolerate nocturnal ventilation tracheostomy may be indicated if she wants to live We will ask physical therapy to be involved  I updated her daughter at the bedside   Best practice:  Per primary   Erick Colace ACNP-BC [place] Pager # [phone] OR # [phone] if no answer

## 2019-10-21 NOTE — Progress Notes (Signed)
Patient agreed to go back on BiPAP. Placed mask on patient and adjusted straps.

## 2019-10-21 NOTE — Progress Notes (Signed)
Paged Sharlet Salina, NP to make her aware of K+ 3.1

## 2019-10-21 NOTE — Progress Notes (Signed)
Inpatient Rehabilitation-Admissions Coordinator   Noted pt continues to have difficulty with mobility. This AC has been following since initial screen request on 5/4. AC will place IP Rehab consult order in the chart per protocol. An AC will follow up for further assessment.   Raechel Ache, OTR/L  Rehab Admissions Coordinator  [phone] 10/21/2019 11:47 AM

## 2019-10-21 NOTE — Progress Notes (Signed)
Inpatient Rehabilitation Admissions Coordinator  Inpatient rehab consult received. I will follow up on Monday for bedside assessment for candidacy for a possible inpt rehab admission pending her progress medically and functionally  over the weekend. Please call me with any questions.  Danne Baxter, RN, MSN Rehab Admissions Coordinator [phone] 10/21/2019 1:11 PM

## 2019-10-21 NOTE — Progress Notes (Signed)
Encouraged patient to wear bipap and she stated "i'm upset because I can't tolerate it tonight, I need longer off of it for now." RN will come back and encourage patient to wear bipap again.

## 2019-10-21 NOTE — Progress Notes (Signed)
PROGRESS NOTE    Daisy [name]  HER:[id number] DOB: [birth year] DOA: 10/17/2019 PCP: Alvester Chou, NP    Chief Complaint  Patient presents with  . Shortness of Breath  . Weakness  . Leg Swelling    Brief Narrative:  25 white female estrogen receptor + breast cancer (Dr. Knox Royalty post L lumpectomy/adjuvant XRT 2018 Tobacco + PAD (L duplex 07/17/2015 L ABI 0.68 R ABI 0.61) Class III severe obesity BMI 54 Reflux HTN-Home meds do not include any diuretics Admit 10/17/2019 cramping hands feet (recently received coronavirus 19 vaccination) Circumstantial evidence = DOE, increased fatigue, increased L LE edema CXR = cardiac enlargement, CHF Lasix started in ED, meds adjusted     Assessment & Plan:   Active Problems:   Claudication in peripheral vascular disease ([place])   Obesity hypoventilation syndrome (HCC)   GERD (gastroesophageal reflux disease)   Acute heart failure (HCC)   History of breast cancer   AKI (acute kidney injury) ([place])   Essential hypertension   Slurred speech  Cardio-pulm AECHF presumed diastolic-BNP on admission 769 echocardiogram is pending OHSS?  Sleep apnea given habitus-pulmonary hypertension type III Events 5/4 PM noted-patient placed on BiPAP sent to stepdown unit ABG = 7.17 initially now improved to 7.21-likely both a severe restrictive obstructive pattern  Serial ABG have shown improvement Lasix IV 40 twice daily-cautious continuation metoprolol XL 12.5 Discontinue scheduled Naprosyn [name] has been held from admission  Appreciate critical care input  Patient is unwilling to use BiPAP it looks like long-term and I appreciate critical care counseling patient about the same-I also had an extensive discussion with the patient and daughters about this being a life prolonging modality and I think the patient understood the need for the same  Renal Likely cardiorenal syndrome with AKI-Baseline creatinine 08/03/2016 Hypervolemic  hyponatremia on admission sodium is esolving with diuresis -Confounding factor is BMI of 50 Estimated Creatinine Clearance: 56.8 mL/min (A) (by C-G formula based on SCr of 1.42 mg/dL (H)). need to continue diuresing  Bicarb climbing steadily to 80 secondary to metabolic compensation for respiratory acidosis -increase Diamox to 500 twice daily 5/7 Continue potassium replacement aggressively BUN/creatinine stable 27/1.6-->22/1.4   FEN Class III morbid obesity BMI >50 Continue volume restriction, education regarding diet, may benefit from further input as an outpatient bariatrics   DVT prophylaxis: Lovenox Code Status: Full Family Communication: Long discussion with both family daughters at the bedside Long discussion about need for BiPAP discussed with critical care in addition who might recommend Trelegy machine on discharge as an aid to help transition the patient home  Disposition: We will need to stay on stepdown until her acute respiratory issues have resolved  Status is: Inpatient  Remains inpatient appropriate because:Hemodynamically unstable, Persistent severe electrolyte disturbances, Unsafe d/c plan and Inpatient level of care appropriate due to severity of illness   Dispo: The patient is from: Home              Anticipated d/c is to: SNF              Anticipated d/c date is: > 3 days              Patient currently is not medically stable to d/c.    Consultants:   Curbside pulmonology  Procedures: None  Antimicrobials: No   Subjective: Appears to have gotten tired overnight and confused called daughter and did not know what she was saying Suspect respiratory CO2 narcosis because she was not compliant on BiPAP at night-it  has been reinforced with her multiple times today that at 10 PM she will need to use the BiPAP Otherwise she is eating and drinking  Objective: Vitals:   10/21/19 1100 10/21/19 1200 10/21/19 1300 10/21/19 1400  BP: (!) 120/55  113/66 (!) 133/53   Pulse: 91 85 87 (!) 104  Resp: (!) 29 (!) 21 19 18   Temp:      TempSrc:      SpO2: 92% (!) 88% 90% 90%  Weight:      Height:        Intake/Output Summary (Last 24 hours) at 10/21/2019 1547 Last data filed at 10/21/2019 1105 Gross per 24 hour  Intake 288.71 ml  Output 3925 ml  Net -3636.29 ml   Filed Weights   10/18/19 1752 10/19/19 0417 10/21/19 0500  Weight: (!) 153.4 kg (!) 153.4 kg (!) 151.8 kg    Examination:  General exam: Awake o obese thick neck Mallampati 4 Respiratory system: Poor exam limited secondary to BiPAP/massive habitus Cardiovascular system: S1-S2 no murmur poor exam Gastrointestinal system: Obese nontender nondistended  Central nervous system: Intact no focal deficit Extremities: Mild lower extremity edema- Skin: As above Psychiatry: Flat    Data Reviewed: I have personally reviewed following labs and imaging studies Potassium 3.2--->2.8-->3.1 Bicarb 36--->40-->42 BUN/creatinine 27/1.6--->21/1.43->22/1.4  TSH 0.26   Radiology Studies: DG ESOPHAGUS W SINGLE CM (SOL OR THIN BA)  Result Date: 10/20/2019 CLINICAL DATA:  Acid reflux. Additional history obtained from patient: Patient denies regurgitation of fluid or liquids at time of exam. EXAM: ESOPHOGRAM / BARIUM SWALLOW / BARIUM TABLET STUDY TECHNIQUE: A single contrast examination was performed using thin barium liquid. The patient was observed with fluoroscopy swallowing a 13 mm barium sulphate tablet. FLUOROSCOPY TIME:  Fluoroscopy Time:  3 minutes Radiation Exposure Index (if provided by the fluoroscopic device): 32.90 mGy Number of Acquired Spot Images: 4 COMPARISON:  CT abdomen/pelvis 03/14/2016, chest radiograph 10/18/2018 FINDINGS: Limited examination due to limited patient positioning. A single contrast study was performed. There is a left lateral contrast outpouching at the level of the upper cervical esophagus suspicious for Dolores Frame diverticulum (see and rotations on images). Elsewhere,  there is no evidence of esophageal mass or mucosal abnormality. There is intermittent esophageal dysmotility. There is a small sliding hiatal hernia. Small volume gastroesophageal reflux to the level of the lower esophagus. A swallowed 13 mm barium tablet remained within the distal esophagus and did not pass into the stomach despite a prolonged period of observation. IMPRESSION: 1. 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 2. Intermittent esophageal dysmotility. 3. Small volume gastroesophageal reflux to the level of the lower esophagus. 4. Small sliding hiatal hernia. 5. A 13 mm barium tablet remained within the distal esophagus and did not pass into the stomach despite a prolonged period of observation. A mild smooth distal esophageal stricture cannot be excluded. Electronically Signed   By: Kellie Simmering DO   On: 10/20/2019 09:51      Scheduled Meds: . acetaZOLAMIDE  500 mg Oral BID  . anastrozole  1 mg Oral Daily  . aspirin  81 mg Oral Daily  . cephALEXin  500 mg Oral Q12H  . chlorhexidine  15 mL Mouth Rinse BID  . Chlorhexidine Gluconate Cloth  6 each Topical Daily  . cilostazol  100 mg Oral BID  . enoxaparin (LOVENOX) injection  60 mg Subcutaneous Daily  . furosemide  40 mg Intravenous BID  . mouth rinse  15  mL Mouth Rinse q12n4p  . metoprolol succinate  12.5 mg Oral Daily  . multivitamin  1 tablet Oral Daily  . nicotine  7 mg Transdermal Daily  . pantoprazole sodium  20 mg Per Tube Daily  . potassium chloride  40 mEq Oral Daily  . sodium chloride flush  3 mL Intravenous Q12H   Continuous Infusions: . sodium chloride       LOS: 4 days    Time spent: [place], MD [facility]   To contact the attending provider between 7A-7P or the covering provider during after hours 7P-7A, please log into the web site [email or web address] and access using universal [place] password for that web site. If you do not  have the password, please call the hospital operator.  10/21/2019, 3:47 PM

## 2019-10-21 NOTE — Progress Notes (Addendum)
Physical Therapy Treatment Patient Details Name: Daisy [name] MRN: [id number] DOB: 1950-08-19 Today's Date: 10/21/2019    History of Present Illness 69 yo female with onset of acute SOB and fatigue with alteration of mobility , hand and foot cramps.  Noted significant LE edema,PMHx:  OA, cholecystitis, depression, dyspnea, HLD, L breast CA, PVD, PNA, rotator cuff repair.    PT Comments    Patient found moving to sit on bed edge with family present. PT/RN to assist to recliner and advised patient/faily to call staff for safety with mobility. Patient required max to mod assist with partial standing and pivot to recliner. Patient remains very weak and requires assistance for mobility at this time. Continue PT for mobility. Patient on HFNC 6 L with SPO2 >88%.  Follow Up Recommendations  CIR;Supervision/Assistance - 24 hour     Equipment Recommendations  None recommended by PT    Recommendations for Other Services Rehab consultOT consult.     Precautions / Restrictions Precautions Precautions: Fall;Back Restrictions Weight Bearing Restrictions: No    Mobility  Bed Mobility Overal bed mobility: Needs Assistance Bed Mobility: Supine to Sit     Supine to sit: Min assist;HOB elevated     General bed mobility comments: min assist, use of bed rail  Transfers Overall transfer level: Needs assistance Equipment used: Rolling walker (2 wheeled);1 person hand held assist;2 person hand held assist Transfers: Sit to/from Stand;Lateral/Scoot Transfers Sit to Stand: Max assist;From elevated surface;+2 physical assistance;+2 safety/equipment         General transfer comment: max assist to stand with HHA but sat back down. RW then placed and stood and took very small shuffling steps to get  to recliner, not bearing down on the RW at times.  Ambulation/Gait                 Stairs             Wheelchair Mobility    Modified Rankin (Stroke Patients Only)       Balance Overall balance assessment: Needs assistance Sitting-balance support: Feet supported;Single extremity supported Sitting balance-Leahy Scale: Fair     Standing balance support: Bilateral upper extremity supported;During functional activity Standing balance-Leahy Scale: Poor                              Cognition Arousal/Alertness: Awake/alert Behavior During Therapy: Flat affect Overall Cognitive Status: Impaired/Different from baseline Area of Impairment: Problem solving;Awareness;Safety/judgement;Following commands;Memory;Attention;Orientation                   Current Attention Level: Selective Memory: Decreased recall of precautions;Decreased short-term memory Following Commands: Follows one step commands consistently Safety/Judgement: Decreased awareness of deficits;Decreased awareness of safety Awareness: Emergent Problem Solving: Slow processing General Comments: Patient noted to be getting to bed edge by daughter assisting. Cautioned both to not perform without staff assistance      Exercises      General Comments        Pertinent Vitals/Pain Pain Assessment: Faces Faces Pain Scale: Hurts little more Pain Location: arms Pain Descriptors / Indicators: Discomfort Pain Intervention(s): Monitored during session    Home Living                      Prior Function            PT Goals (current goals can now be found in the care plan section) Progress towards PT goals: Progressing  toward goals    Frequency    Min 3X/week      PT Plan Current plan remains appropriate;Frequency needs to be updated    Co-evaluation              AM-PAC PT "6 Clicks" Mobility   Outcome Measure  Help needed turning from your back to your side while in a flat bed without using bedrails?: A Little Help needed moving from lying on your back to sitting on the side of a flat bed without using bedrails?: A Little Help needed moving to and  from a bed to a chair (including a wheelchair)?: Total Help needed standing up from a chair using your arms (e.g., wheelchair or bedside chair)?: Total Help needed to walk in hospital room?: Total Help needed climbing 3-5 steps with a railing? : Total 6 Click Score: 10    End of Session Equipment Utilized During Treatment: Oxygen Activity Tolerance: Patient limited by fatigue;Treatment limited secondary to medical complications (Comment) Patient left: in chair;with call bell/phone within reach Nurse Communication: Mobility status;Other (comment)l need lift back to bed. PT Visit Diagnosis: Muscle weakness (generalized) (M62.81);Other abnormalities of gait and mobility (R26.89);Difficulty in walking, not elsewhere classified (R26.2)     Time: 1610-9604 PT Time Calculation (min) (ACUTE ONLY): 17 min  Charges:  $Therapeutic Activity: 8-22 mins                     Tresa Endo PT Acute Rehabilitation Services Pager [phone] Office [phone]    Claretha Cooper 10/21/2019, 10:02 AM

## 2019-10-22 DIAGNOSIS — R319 Hematuria, unspecified: Secondary | ICD-10-CM | POA: Diagnosis not present

## 2019-10-22 DIAGNOSIS — J9621 Acute and chronic respiratory failure with hypoxia: Secondary | ICD-10-CM | POA: Diagnosis present

## 2019-10-22 DIAGNOSIS — E662 Morbid (severe) obesity with alveolar hypoventilation: Secondary | ICD-10-CM | POA: Diagnosis not present

## 2019-10-22 DIAGNOSIS — N390 Urinary tract infection, site not specified: Secondary | ICD-10-CM | POA: Diagnosis not present

## 2019-10-22 DIAGNOSIS — J9622 Acute and chronic respiratory failure with hypercapnia: Secondary | ICD-10-CM | POA: Diagnosis present

## 2019-10-22 LAB — BLOOD GAS, ARTERIAL
Acid-Base Excess: 15.2 mmol/L — ABNORMAL HIGH (ref 0.0–2.0)
Bicarbonate: 44.7 mmol/L — ABNORMAL HIGH (ref 20.0–28.0)
Delivery systems: POSITIVE
Expiratory PAP: 5
FIO2: 50
Inspiratory PAP: 10
O2 Saturation: 97.1 %
Patient temperature: 98.6
RATE: 21 resp/min
pCO2 arterial: 81.3 mmHg (ref 32.0–48.0)
pH, Arterial: 7.359 (ref 7.350–7.450)
pO2, Arterial: 99.6 mmHg (ref 83.0–108.0)

## 2019-10-22 LAB — COMPREHENSIVE METABOLIC PANEL
ALT: 17 U/L (ref 0–44)
AST: 13 U/L — ABNORMAL LOW (ref 15–41)
Albumin: 3.3 g/dL — ABNORMAL LOW (ref 3.5–5.0)
Alkaline Phosphatase: 70 U/L (ref 38–126)
Anion gap: 12 (ref 5–15)
BUN: 25 mg/dL — ABNORMAL HIGH (ref 8–23)
CO2: 41 mmol/L — ABNORMAL HIGH (ref 22–32)
Calcium: 9.1 mg/dL (ref 8.9–10.3)
Chloride: 84 mmol/L — ABNORMAL LOW (ref 98–111)
Creatinine, Ser: 1.57 mg/dL — ABNORMAL HIGH (ref 0.44–1.00)
GFR calc Af Amer: 39 mL/min — ABNORMAL LOW (ref >60)
GFR calc non Af Amer: 33 mL/min — ABNORMAL LOW (ref >60)
Glucose, Bld: 122 mg/dL — ABNORMAL HIGH (ref 70–99)
Potassium: 3.1 mmol/L — ABNORMAL LOW (ref 3.5–5.1)
Sodium: 137 mmol/L (ref 135–145)
Total Bilirubin: 0.8 mg/dL (ref 0.3–1.2)
Total Protein: 6.5 g/dL (ref 6.5–8.1)

## 2019-10-22 MED ORDER — POTASSIUM CHLORIDE CRYS ER 20 MEQ PO TBCR
40.0000 meq | EXTENDED_RELEASE_TABLET | Freq: Two times a day (BID) | ORAL | Status: DC
  Administered 2019-10-22 – 2019-10-29 (×15): 40 meq via ORAL
  Filled 2019-10-22 (×15): qty 2

## 2019-10-22 NOTE — Progress Notes (Signed)
NAME:  Daisy [name], MRN:  [id number], DOB:  February 22, 1951, LOS: 5 ADMISSION DATE:  10/17/2019, CONSULTATION DATE: 5/6 REFERRING MD:  Verlon Au, CHIEF COMPLAINT:  Pulmonary hypertension type III, sleep apnea.  With acute decompensated diastolic heart failure  Brief History   69 year old female admitted on 5/3 with fatigue, swelling, exertional dyspnea, and hypercarbia.  Admitted with working diagnosis of decompensated diastolic heart failure and cor pulmonale in the setting of untreated OSA.  Improved with positive pressure ventilation and IV diuresis.  Pulmonary asked to evaluate on 5/6 for further recommendations going forward    Past Medical History  Morbid obesity, chronic dyspnea, left breast cancer status post radiation, peripheral vascular disease, depression, arthritis, acid reflux disease  Significant Hospital Events   5/4 admitted w/ decompensated HF-->treated BIPAP and lasix 5/6 improved w/ rx as above ->PCCM asked to see re: further treatment rec.  5/7.  Continuing to improve, intake output balance improved.  Still requiring significant oxygen.  Having some difficulty during the p.m. hours with compliance  Consults:  pulm 5/6  Procedures:    Significant Diagnostic Tests:  Head CT 5/4: Mild chronic ischemic microvascular disease.   Micro Data:  Urine 5/3 Pos e coli pan sensitive Covid 19 PCR   5/3 neg  Flu 5/3   PCR  Neg MRSA  5/4  Neg   Antimicrobials:  ceftriazone 5/3 only Keflex 5/3 >>>   Scheduled Meds: . acetaZOLAMIDE  500 mg Oral BID  . anastrozole  1 mg Oral Daily  . aspirin  81 mg Oral Daily  . chlorhexidine  15 mL Mouth Rinse BID  . Chlorhexidine Gluconate Cloth  6 each Topical Daily  . cilostazol  100 mg Oral BID  . enoxaparin (LOVENOX) injection  60 mg Subcutaneous Daily  . furosemide  40 mg Intravenous BID  . mouth rinse  15 mL Mouth Rinse q12n4p  . metoprolol succinate  12.5 mg Oral Daily  . multivitamin  1 tablet Oral Daily  . nicotine  7  mg Transdermal Daily  . pantoprazole sodium  20 mg Per Tube Daily  . potassium chloride  40 mEq Oral BID  . sodium chloride flush  3 mL Intravenous Q12H   Continuous Infusions: . sodium chloride     PRN Meds:.sodium chloride, acetaminophen, albuterol, diclofenac sodium, naphazoline-glycerin, naproxen, ondansetron (ZOFRAN) IV, polyethylene glycol, sodium chloride flush  Interim history/subjective:  Sitting up in bed p "best night yet' on bipap, no sob at rest   Objective   Blood pressure (!) 126/58, pulse 78, temperature 98 F (36.7 C), temperature source Oral, resp. rate 14, height 5\' 6"  (1.676 m), weight (!) 148.1 kg, SpO2 90 %.        Intake/Output Summary (Last 24 hours) at 10/22/2019 1425 Last data filed at 10/22/2019 0500 Gross per 24 hour  Intake --  Output 1850 ml  Net -1850 ml   Filed Weights   10/19/19 0417 10/21/19 0500 10/22/19 0500  Weight: (!) 153.4 kg (!) 151.8 kg (!) 148.1 kg    Examination: tmax 99 Pt alert, approp nad @ 45 degrees HOB No jvd Oropharynx clear,  mucosa nl Neck supple Lungs with minimal scattered exp > insp rhonchi bilaterally RRR no s3 or or sign murmur Abd obese with limited  excursion  Extr warm with no edema or clubbing noted Neuro  Sensorium  intactc  no apparent motor deficits     Resolved Hospital Problem list   Hyponatremia  Resolved acute metabolic encephalopathy secondary to hypercarbia Assessment &  Plan:   Acute on chronic hypoxic and Hypercarbic respiratory failure  Decompensated Diastolic RF Cor Pulmonale  Pulmonary edema and volume overload  Asterixis  AKI Fluid and electrolyte imbalance: hypokalemia    Discussion  Acute on chronic hypoxic and hypercarbic respiratory failure 2/2 untreated OSA w/ decompensated diastolic HF, pulmonary edema and cor pulmonale.  -much better p bipap overnight with well compensated resp acidosis on AM ABG and "best she's slept yet"  Plan/rec Continue IV diuresis as long as BUN/creatinine  and blood pressure will tolerate  Continue nocturnal noninvasive ventilation, also use this during the day at time of rest  I would ensure she places the NIV on at 10 PM each night We will ask physical therapy to be involved D/c diamox/ supplement with KCL  - add aldacone if want to diures more aggressively as need high bicarb buffer level in this setting.         2) morbid obesity c/b obesit hypoventilation syndrome Lab Results  Component Value Date   TSH 0.267 (L) 10/19/2019    >>> likely life time BIBAP at discharge and f/u with sleep medicine     Christinia Gully, MD [facility] [phone] After 6:00 PM or weekends, use Beeper [phone]  After 7:00 pm call Elink  [phone]

## 2019-10-22 NOTE — Hospital Course (Signed)
[name]

## 2019-10-22 NOTE — Progress Notes (Signed)
PROGRESS NOTE    Daisy [name]  JEH:[id number] DOB: [birth year] DOA: 10/17/2019 PCP: Alvester Chou, NP    Chief Complaint  Patient presents with   Shortness of Breath   Weakness   Leg Swelling    Brief Narrative:  36 white female estrogen receptor + breast cancer (Dr. Knox Royalty post L lumpectomy/adjuvant XRT 2018 Tobacco + PAD (L duplex 07/17/2015 L ABI 0.68 R ABI 0.61) Class III severe obesity BMI 54 Reflux HTN-Home meds do not include any diuretics Admit 10/17/2019 cramping hands feet (recently received coronavirus 19 vaccination) Circumstantial evidence = DOE, increased fatigue, increased L LE edema CXR = cardiac enlargement, CHF Lasix started in ED, meds adjusted     Assessment & Plan:   Active Problems:   Claudication in peripheral vascular disease ([place])   Obesity hypoventilation syndrome (HCC)   GERD (gastroesophageal reflux disease)   Acute heart failure (HCC)   History of breast cancer   AKI (acute kidney injury) ([place])   Essential hypertension   Slurred speech  Cardio-pulm AECHF presumed diastolic-BNP on admission 769 echocardiogram is pending OHSS?  Sleep apnea given habitus-pulmonary hypertension type III Events 5/4 PM noted-patient placed on BiPAP sent to stepdown unit ABG = 7.17 initially now improved to 7.21-likely both a severe restrictive obstructive pattern  Serial ABG have shown improvement--her baseline pCO2=80 Continuing Lasix IV 40 twice daily- metoprolol XL 12.5 Discontinue scheduled Naprosyn [name] has been held from admission  Appreciate critical care input  BiPAP has been better tolerated 5/7 PM-Case manager consulted for setting up Trelegy ventilator for home if in case she goes home   Renal Likely cardiorenal syndrome with AKI-Baseline creatinine 08/03/2016 Hypervolemic hyponatremia on admission sodium is esolving with diuresis -Confounding factor is BMI of 50 Estimated Creatinine Clearance: 50.6 mL/min (A) (by  C-G formula based on SCr of 1.57 mg/dL (H)). need to continue diuresing  Bicarb currently 41-continuing Diamox  500 twice daily 5/7 Continue potassium replacement aggressively BUN/creatinine stable 27/1.6-->22/1.4-->25/1.5 I think we are nearing her goal weight and in the next day today and a half we will transition IV Lasix to torsemide   FEN Class III morbid obesity BMI >50 Continue volume restriction, education regarding diet, may benefit from further input as an outpatient bariatrics  At risk for aspiration-SLP saw the patient 5/6 recommended reflux medications prior to meals more frequent meals eat slowly and sit up 30 degrees to facilitate esophageal clearing and recommending dysphagia 2 solids thin liquids   DVT prophylaxis: Lovenox Code Status: Full Family Communication: Brief discussion with daughter [name] at the bedside  Disposition: We will need to stay on stepdown until her acute respiratory issues have resolved It appears CIR is looking at the patient for functional status and may revisit on 5/10 candidacy-in the meantime I will asked social work/TOC to set up Trelegy in case that plan falls through  Status is: Inpatient  Remains inpatient appropriate because:Hemodynamically unstable, Unsafe d/c plan and Inpatient level of care appropriate due to severity of illness   Dispo: The patient is from: Home              Anticipated d/c is to: SNF or CIR depending on above              Anticipated d/c date is: > 3 days              Patient currently is not medically stable to d/c.    Consultants:   Curbside pulmonology  Procedures: None  Antimicrobials: No  Subjective: Did much better overnight Eating breakfast at bedside No chest pain No fever Looks more rested than she has in several days  Objective: Vitals:   10/22/19 0300 10/22/19 0400 10/22/19 0500 10/22/19 0846  BP: (!) 131/55 (!) 126/58    Pulse: 89 89 78   Resp: (!) 21 (!) 21 14   Temp:  99 F  (37.2 C)  98.1 F (36.7 C)  TempSrc:  Axillary  Oral  SpO2: 93% 91% 90%   Weight:   (!) 148.1 kg   Height:        Intake/Output Summary (Last 24 hours) at 10/22/2019 6948 Last data filed at 10/22/2019 0500 Gross per 24 hour  Intake --  Output 2600 ml  Net -2600 ml   Filed Weights   10/19/19 0417 10/21/19 0500 10/22/19 0500  Weight: (!) 153.4 kg (!) 151.8 kg (!) 148.1 kg    Examination:  Mallampati 4 no JVD thick neck S1-S2 no murmur Monitor show PVCs sinus rhythm predominantly Abdomen soft significantly obese poor exam Chest clear     Data Reviewed: I have personally reviewed following labs and imaging studies Potassium 3.2--->2.8-->3.1-->3.1 Bicarb 36--->40-->42-->41 BUN/creatinine 27/1.6--->21/1.43->22/1.4-->25/1.5   Radiology Studies: DG ESOPHAGUS W SINGLE CM (SOL OR THIN BA)  Result Date: 10/20/2019 CLINICAL DATA:  Acid reflux. Additional history obtained from patient: Patient denies regurgitation of fluid or liquids at time of exam. EXAM: ESOPHOGRAM / BARIUM SWALLOW / BARIUM TABLET STUDY TECHNIQUE: A single contrast examination was performed using thin barium liquid. The patient was observed with fluoroscopy swallowing a 13 mm barium sulphate tablet. FLUOROSCOPY TIME:  Fluoroscopy Time:  3 minutes Radiation Exposure Index (if provided by the fluoroscopic device): 32.90 mGy Number of Acquired Spot Images: 4 COMPARISON:  CT abdomen/pelvis 03/14/2016, chest radiograph 10/18/2018 FINDINGS: Limited examination due to limited patient positioning. A single contrast study was performed. There is a left lateral contrast outpouching at the level of the upper cervical esophagus suspicious for Dolores Frame diverticulum (see and rotations on images). Elsewhere, there is no evidence of esophageal mass or mucosal abnormality. There is intermittent esophageal dysmotility. There is a small sliding hiatal hernia. Small volume gastroesophageal reflux to the level of the lower esophagus. A  swallowed 13 mm barium tablet remained within the distal esophagus and did not pass into the stomach despite a prolonged period of observation. IMPRESSION: 1. 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 2. Intermittent esophageal dysmotility. 3. Small volume gastroesophageal reflux to the level of the lower esophagus. 4. Small sliding hiatal hernia. 5. A 13 mm barium tablet remained within the distal esophagus and did not pass into the stomach despite a prolonged period of observation. A mild smooth distal esophageal stricture cannot be excluded. Electronically Signed   By: Kellie Simmering DO   On: 10/20/2019 09:51      Scheduled Meds:  acetaZOLAMIDE  500 mg Oral BID   anastrozole  1 mg Oral Daily   aspirin  81 mg Oral Daily   cephALEXin  500 mg Oral Q12H   chlorhexidine  15 mL Mouth Rinse BID   Chlorhexidine Gluconate Cloth  6 each Topical Daily   cilostazol  100 mg Oral BID   enoxaparin (LOVENOX) injection  60 mg Subcutaneous Daily   furosemide  40 mg Intravenous BID   mouth rinse  15 mL Mouth Rinse q12n4p   metoprolol succinate  12.5 mg Oral Daily   multivitamin  1 tablet Oral Daily   nicotine  7 mg Transdermal Daily   pantoprazole sodium  20 mg Per Tube Daily   potassium chloride  40 mEq Oral BID   sodium chloride flush  3 mL Intravenous Q12H   Continuous Infusions:  sodium chloride       LOS: 5 days    Time spent: [place], MD [facility]   To contact the attending provider between 7A-7P or the covering provider during after hours 7P-7A, please log into the web site [email or web address] and access using universal [place] password for that web site. If you do not have the password, please call the hospital operator.  10/22/2019, 9:17 AM

## 2019-10-22 NOTE — Evaluation (Addendum)
Occupational Therapy Evaluation Patient Details Name: Daisy [name] MRN: [id number] DOB: Mar 23, 1951 Today's Date: 10/22/2019    History of Present Illness 69 yo female with onset of acute SOB and fatigue with alteration of mobility , hand and foot cramps.  Noted significant LE edema,PMHx:  OA, cholecystitis, depression, dyspnea, HLD, L breast CA, PVD, PNA, rotator cuff repair.   Clinical Impression   [name] is a normally independent 69 year old woman who presents with generalized weakness, decreased activity tolerance and cardiopulmonary endurance (on 6L HFNC) that limit patient's ability to perform baseline ADLS and mobility. Patient's tolerance limited to short marching in place approx 20 sec and 35 seconds. Patient is also limited by comorbidities including obesity and chronic low back pain. Patient will benefit from skilled OT services to improve deficits, learn compensatory strategies and safe use of DME to return to PLOF. Patient and family would like CIR at discharge. Increased time for education with patient and famiy in regards to POC and differences between settings.    Follow Up Recommendations  CIR;SNF(Family wants CIR at discharge. SNF if CIR not an option.)    Equipment Recommendations       Recommendations for Other Services Rehab consult     Precautions / Restrictions Precautions Precautions: Fall Restrictions Weight Bearing Restrictions: No      Mobility Bed Mobility                  Transfers   Equipment used: Rolling walker (2 wheeled) Transfers: Sit to/from Stand Sit to Stand: Min assist         General transfer comment: Patient seated in recliner when therapist entered the room. Patient min assist to stand from recliner with verbal cues for hand placement and safety. Patient able to march in place 20 seconds with episode of mild knee buckling. Then 35 seconds without knee buckling. Exhibtis poor endurance for functional tasks.     Balance Overall balance assessment: No apparent balance deficits (not formally assessed)                                         ADL either performed or assessed with clinical judgement   ADL Overall ADL's : Needs assistance/impaired Eating/Feeding: Set up   Grooming: Set up   Upper Body Bathing: Set up   Lower Body Bathing: Moderate assistance   Upper Body Dressing : Set up   Lower Body Dressing: Moderate assistance   Toilet Transfer: Minimal assistance;BSC;RW   Toileting- Clothing Manipulation and Hygiene: Moderate assistance;Sit to/from stand       Functional mobility during ADLs: Minimal assistance;Rolling walker       Vision   Vision Assessment?: No apparent visual deficits     Perception     Praxis      Pertinent Vitals/Pain Pain Assessment: No/denies pain     Hand Dominance Left   Extremity/Trunk Assessment Upper Extremity Assessment Upper Extremity Assessment: Overall WFL for tasks assessed(4/5 strength bilaterally.)   Lower Extremity Assessment Lower Extremity Assessment: Defer to PT evaluation   Cervical / Trunk Assessment Cervical / Trunk Assessment: Kyphotic   Communication Communication Communication: No difficulties   Cognition Arousal/Alertness: Awake/alert Behavior During Therapy: WFL for tasks assessed/performed Overall Cognitive Status: Within Functional Limits for tasks assessed  Following Commands: Follows multi-step commands consistently           General Comments       Exercises     Shoulder Instructions      Home Living Family/patient expects to be discharged to:: Private residence Living Arrangements: Alone Available Help at Discharge: Family;Available PRN/intermittently Type of Home: House Home Access: Stairs to enter [employer] of Steps: 6 Entrance Stairs-Rails: Can reach both Home Layout: One level     Bathroom Shower/Tub: Animal nutritionist: Standard Bathroom Accessibility: Yes   Home Equipment: Environmental consultant - 2 wheels;Tub bench;Cane - single point;Walker - 4 wheels   Additional Comments: family is asking about getting a ramp built      Prior Functioning/Environment Level of Independence: Independent with assistive device(s)        Comments: was using a cane out of home and predominantly furniture surfing.        OT Problem List: Decreased strength;Decreased activity tolerance;Obesity      OT Treatment/Interventions: Self-care/ADL training;Therapeutic exercise;Energy conservation;DME and/or AE instruction;Patient/family education;Therapeutic activities    OT Goals(Current goals can be found in the care plan section) Acute Rehab OT Goals Patient Stated Goal: to be independent again OT Goal Formulation: With patient Time For Goal Achievement: 11/05/19 Potential to Achieve Goals: Good  OT Frequency: Min 2X/week   Barriers to D/C:            Co-evaluation              AM-PAC OT "6 Clicks" Daily Activity     Outcome Measure Help from another person eating meals?: None Help from another person taking care of personal grooming?: A Little Help from another person toileting, which includes using toliet, bedpan, or urinal?: A Lot Help from another person bathing (including washing, rinsing, drying)?: A Lot Help from another person to put on and taking off regular upper body clothing?: A Little Help from another person to put on and taking off regular lower body clothing?: A Lot 6 Click Score: 16   End of Session Equipment Utilized During Treatment: Gait belt;Rolling walker  Activity Tolerance: Patient tolerated treatment well Patient left: in chair;with call bell/phone within reach;with family/visitor present  OT Visit Diagnosis: Muscle weakness (generalized) (M62.81);History of falling (Z91.81)                Time: 5726-2035 OT Time Calculation (min): 31 min Charges:  OT General  Charges $OT Visit: 1 Visit OT Evaluation $OT Eval Low Complexity: 1 Low OT Treatments $Therapeutic Activity: 8-22 mins  Daisy [name], OTR/L Acute Care Rehab Services  Office [phone]   Daisy [name] 10/22/2019, 2:26 PM

## 2019-10-22 NOTE — Progress Notes (Signed)
RN made this RT aware of Critical ABG results, results consistent with previous ABG's on file, pt. is easily awoken and remained on BiPAP after ABG drawn.

## 2019-10-22 NOTE — Progress Notes (Signed)
CRITICAL VALUE ALERT  Critical Value: Pco2 81.3 Date & Time Notied:  10/22/2019 at 0520  Provider Notified:[place] Orders Received/Actions taken: none

## 2019-10-23 LAB — COMPREHENSIVE METABOLIC PANEL
ALT: 15 U/L (ref 0–44)
AST: 14 U/L — ABNORMAL LOW (ref 15–41)
Albumin: 3.2 g/dL — ABNORMAL LOW (ref 3.5–5.0)
Alkaline Phosphatase: 64 U/L (ref 38–126)
Anion gap: 11 (ref 5–15)
BUN: 24 mg/dL — ABNORMAL HIGH (ref 8–23)
CO2: 38 mmol/L — ABNORMAL HIGH (ref 22–32)
Calcium: 9.4 mg/dL (ref 8.9–10.3)
Chloride: 89 mmol/L — ABNORMAL LOW (ref 98–111)
Creatinine, Ser: 1.39 mg/dL — ABNORMAL HIGH (ref 0.44–1.00)
GFR calc Af Amer: 45 mL/min — ABNORMAL LOW (ref >60)
GFR calc non Af Amer: 39 mL/min — ABNORMAL LOW (ref >60)
Glucose, Bld: 117 mg/dL — ABNORMAL HIGH (ref 70–99)
Potassium: 3.7 mmol/L (ref 3.5–5.1)
Sodium: 138 mmol/L (ref 135–145)
Total Bilirubin: 0.6 mg/dL (ref 0.3–1.2)
Total Protein: 6.6 g/dL (ref 6.5–8.1)

## 2019-10-23 MED ORDER — TORSEMIDE 10 MG PO TABS
10.0000 mg | ORAL_TABLET | Freq: Two times a day (BID) | ORAL | Status: DC
  Administered 2019-10-23 – 2019-11-02 (×20): 10 mg via ORAL
  Filled 2019-10-23 (×21): qty 1

## 2019-10-23 NOTE — Progress Notes (Signed)
PROGRESS NOTE    Daisy [name]  PXT:[id number] DOB: 1951/04/20 DOA: 10/17/2019 PCP: Alvester Chou, NP    Chief Complaint  Patient presents with  . Shortness of Breath  . Weakness  . Leg Swelling    Brief Narrative:  47 white female estrogen receptor + breast cancer (Dr. Knox Royalty post L lumpectomy/adjuvant XRT 2018 Tobacco + PAD (L duplex 07/17/2015 L ABI 0.68 R ABI 0.61) Class III severe obesity BMI 54 Reflux HTN-Home meds do not include any diuretics Admit 10/17/2019 cramping hands feet (recently received coronavirus 19 vaccination) Circumstantial evidence = DOE, increased fatigue, increased L LE edema CXR = cardiac enlargement, CHF Lasix started in ED, meds adjusted  Assessment & Plan:   Active Problems:   Claudication in peripheral vascular disease (HCC)   Obesity hypoventilation syndrome (HCC)   GERD (gastroesophageal reflux disease)   Acute heart failure (HCC)   History of breast cancer   AKI (acute kidney injury) ([place])   Essential hypertension   Slurred speech   Acute on chronic respiratory failure with hypoxia and hypercapnia ([place])  Cardio-pulm AECHF presumed diastolic-BNP on admission 769 echocardiogram is pending OHSS?  Sleep apnea given habitus-pulmonary hypertension type III Events 5/4 PM noted-patient placed on BiPAP sent to stepdown unit ABG = 7.17 initially now improved to 7.21-likely both a severe restrictive obstructive pattern  Serial ABG have shown improvement--her baseline pCO2=80 Continuing Lasix IV 40 twice daily- metoprolol XL 12.5 Currently -14.3 L weight 159-->148 kg On discharge hold Naprosyn in addition to Entresto---> will ask cardiology regarding Entresto when to resume Appreciate critical care input  BiPAP has been better tolerated 5/7 through 5/9 and she is tolerating really well Case manager consulted for setting up Trelegy ventilator for home if in case she goes home-await their input   Renal Likely cardiorenal  syndrome with AKI-Baseline creatinine 08/03/2016 Hypervolemic hyponatremia on admission sodium is esolving with diuresis -Confounding factor is BMI of 50 Estimated Creatinine Clearance: 57.2 mL/min (A) (by C-G formula based on SCr of 1.39 mg/dL (H)). need to continue diuresing  Bicarb currently 41-Diamox discontinued by PCCM on 5/8 Continue potassium replacement aggressively and this is improved BUN/creatinine stable 27/1.6-->22/1.4-->25/1.5-->24/1.39 Transition to torsemide 10 bid po 5/9   FEN Class III morbid obesity BMI >50 Continue volume restriction, education regarding diet, may benefit from further input as an outpatient bariatrics  At risk for aspiration-SLP saw the patient 5/6 recommended reflux medications prior to meals more frequent meals eat slowly and sit up 30 degrees to facilitate esophageal clearing and recommending dysphagia 2 solids thin liquids   DVT prophylaxis: Lovenox Code Status: Full Family Communication: Brief discussion with daughter [name] at the bedside  Disposition: We will need to stay on stepdown until her acute respiratory issues have resolved It appears CIR is looking at the patient for functional status and may revisit on 5/10 candidacy-in the meantime I will asked social work/TOC to set up Trelegy in case that plan falls through  Status is: Inpatient  Remains inpatient appropriate because:Persistent severe electrolyte disturbances and Inpatient level of care appropriate due to severity of illness   Dispo: The patient is from: Home              Anticipated d/c is to: SNF or CIR depending on above              Anticipated d/c date is: 2 days              Patient currently is not medically stable to d/c.  Consultants:   Curbside pulmonology  Procedures: None  Antimicrobials: No   Subjective: Did much better overnight Eating breakfast at bedside No chest pain No fever Looks more rested than she has in several  days  Objective: Vitals:   10/23/19 0500 10/23/19 0800 10/23/19 0821 10/23/19 1214  BP: (!) 109/46 106/78    Pulse: 84 94    Resp: (!) 21 17    Temp:   98.3 F (36.8 C) 97.8 F (36.6 C)  TempSrc:   Oral Oral  SpO2: (!) 87% 94%    Weight:      Height:        Intake/Output Summary (Last 24 hours) at 10/23/2019 1439 Last data filed at 10/23/2019 0600 Gross per 24 hour  Intake 240 ml  Output 1700 ml  Net -1460 ml   Filed Weights   10/19/19 0417 10/21/19 0500 10/22/19 0500  Weight: (!) 153.4 kg (!) 151.8 kg (!) 148.1 kg    Examination:  Mallampati 4 no JVD thick neck S1-S2 no murmur Monitor show PVCs sinus rhythm predominantly Abdomen soft significantly obese poor exam Chest clear, no crepitation   Data Reviewed: I have personally reviewed following labs and imaging studies Potassium 3.2--->2.8-->3.1-->3.7 Bicarb 36--->40-->42-->41-->38 BUN/creatinine 27/1.6--->21/1.43->22/1.4-->24/1.39   Radiology Studies: No results found.  Scheduled Meds: . anastrozole  1 mg Oral Daily  . aspirin  81 mg Oral Daily  . chlorhexidine  15 mL Mouth Rinse BID  . Chlorhexidine Gluconate Cloth  6 each Topical Daily  . cilostazol  100 mg Oral BID  . enoxaparin (LOVENOX) injection  60 mg Subcutaneous Daily  . furosemide  40 mg Intravenous BID  . mouth rinse  15 mL Mouth Rinse q12n4p  . metoprolol succinate  12.5 mg Oral Daily  . multivitamin  1 tablet Oral Daily  . nicotine  7 mg Transdermal Daily  . pantoprazole sodium  20 mg Per Tube Daily  . potassium chloride  40 mEq Oral BID  . sodium chloride flush  3 mL Intravenous Q12H   Continuous Infusions: . sodium chloride       LOS: 6 days    Time spent: [place], MD [facility]  To contact the attending provider between 7A-7P or the covering provider during after hours 7P-7A, please log into the web site [email or web address] and access using universal [place] password for that web site. If you do not have the  password, please call the hospital operator.  10/23/2019, 2:39 PM

## 2019-10-24 ENCOUNTER — Inpatient Hospital Stay (HOSPITAL_COMMUNITY): Payer: Medicare PPO

## 2019-10-24 LAB — RENAL FUNCTION PANEL
Albumin: 3.3 g/dL — ABNORMAL LOW (ref 3.5–5.0)
Anion gap: 10 (ref 5–15)
BUN: 26 mg/dL — ABNORMAL HIGH (ref 8–23)
CO2: 37 mmol/L — ABNORMAL HIGH (ref 22–32)
Calcium: 9 mg/dL (ref 8.9–10.3)
Chloride: 90 mmol/L — ABNORMAL LOW (ref 98–111)
Creatinine, Ser: 1.35 mg/dL — ABNORMAL HIGH (ref 0.44–1.00)
GFR calc Af Amer: 46 mL/min — ABNORMAL LOW (ref >60)
GFR calc non Af Amer: 40 mL/min — ABNORMAL LOW (ref >60)
Glucose, Bld: 113 mg/dL — ABNORMAL HIGH (ref 70–99)
Phosphorus: 3.1 mg/dL (ref 2.5–4.6)
Potassium: 4.1 mmol/L (ref 3.5–5.1)
Sodium: 137 mmol/L (ref 135–145)

## 2019-10-24 LAB — CBC
HCT: 46.6 % — ABNORMAL HIGH (ref 36.0–46.0)
Hemoglobin: 13.6 g/dL (ref 12.0–15.0)
MCH: 30 pg (ref 26.0–34.0)
MCHC: 29.2 g/dL — ABNORMAL LOW (ref 30.0–36.0)
MCV: 102.6 fL — ABNORMAL HIGH (ref 80.0–100.0)
Platelets: 190 10*3/uL (ref 150–400)
RBC: 4.54 MIL/uL (ref 3.87–5.11)
RDW: 14.7 % (ref 11.5–15.5)
WBC: 7 10*3/uL (ref 4.0–10.5)
nRBC: 0 % (ref 0.0–0.2)

## 2019-10-24 LAB — MAGNESIUM: Magnesium: 2.1 mg/dL (ref 1.7–2.4)

## 2019-10-24 MED ORDER — PANTOPRAZOLE SODIUM 40 MG PO TBEC
40.0000 mg | DELAYED_RELEASE_TABLET | Freq: Every day | ORAL | Status: DC
  Administered 2019-10-24 – 2019-11-02 (×10): 40 mg via ORAL
  Filled 2019-10-24 (×10): qty 1

## 2019-10-24 MED ORDER — ENOXAPARIN SODIUM 80 MG/0.8ML ~~LOC~~ SOLN
70.0000 mg | Freq: Every day | SUBCUTANEOUS | Status: DC
  Administered 2019-10-25 – 2019-11-02 (×9): 70 mg via SUBCUTANEOUS
  Filled 2019-10-24 (×9): qty 0.7

## 2019-10-24 MED ORDER — CHLORHEXIDINE GLUCONATE 0.12 % MT SOLN
OROMUCOSAL | Status: AC
  Filled 2019-10-24: qty 15

## 2019-10-24 MED ORDER — SACUBITRIL-VALSARTAN 24-26 MG PO TABS
1.0000 | ORAL_TABLET | Freq: Two times a day (BID) | ORAL | Status: DC
  Administered 2019-10-24 – 2019-10-25 (×2): 1 via ORAL
  Filled 2019-10-24 (×2): qty 1

## 2019-10-24 NOTE — Progress Notes (Signed)
NAME:  Daisy [name], MRN:  [id number], DOB:  1950-08-23, LOS: 7 ADMISSION DATE:  10/17/2019, CONSULTATION DATE: 5/6 REFERRING MD:  Verlon Au, CHIEF COMPLAINT:  Pulmonary hypertension type III, sleep apnea.  With acute decompensated diastolic heart failure  Brief History   69 year old female admitted on 5/3 with fatigue, swelling, exertional dyspnea, and hypercarbia.  Admitted with working diagnosis of decompensated diastolic heart failure and cor pulmonale in the setting of untreated OSA.  Improved with positive pressure ventilation and IV diuresis.  Pulmonary asked to evaluate on 5/6 for further recommendations going forward    Past Medical History  Morbid obesity, chronic dyspnea, left breast cancer status post radiation, peripheral vascular disease, depression, arthritis, acid reflux disease  Significant Hospital Events   10/17/2019: Urine culture with E. coli pansensitive 5/4 admitted w/ decompensated HF-->treated BIPAP and lasix.  Echocardiogram Oct 18, 2019: Ejection fraction 65% with possible RV failure and indeterminate diastolic dysfunction.  Morbidly obese patient with difficult window. 5/6 improved w/ rx as above ->PCCM asked to see re: further treatment rec.  5/7.  Continuing to improve, intake output balance improved.  Still requiring significant oxygen.  Having some difficulty during the p.m. hours with compliance 5/8 - Sitting up in bed p "best night yet' on bipap, no sob at rest   Consults:  pulm 5/6  Procedures:    Significant Diagnostic Tests:  Head CT 5/4: Mild chronic ischemic microvascular disease.   Micro Data:  Urine 5/3 Pos e coli pan sensitive Covid 19 PCR   5/3 neg  Flu 5/3   PCR  Neg MRSA  5/4  Neg   Antimicrobials:  ceftriazone 5/3 only Keflex 5/3 >>> 5/8      Scheduled Meds: . anastrozole  1 mg Oral Daily  . aspirin  81 mg Oral Daily  . chlorhexidine  15 mL Mouth Rinse BID  . Chlorhexidine Gluconate Cloth  6 each Topical Daily  .  cilostazol  100 mg Oral BID  . enoxaparin (LOVENOX) injection  60 mg Subcutaneous Daily  . mouth rinse  15 mL Mouth Rinse q12n4p  . metoprolol succinate  12.5 mg Oral Daily  . multivitamin  1 tablet Oral Daily  . nicotine  7 mg Transdermal Daily  . pantoprazole  40 mg Oral Daily  . potassium chloride  40 mEq Oral BID  . sodium chloride flush  3 mL Intravenous Q12H  . torsemide  10 mg Oral BID   Continuous Infusions: . sodium chloride     PRN Meds:.sodium chloride, acetaminophen, albuterol, diclofenac sodium, naphazoline-glycerin, naproxen, ondansetron (ZOFRAN) IV, polyethylene glycol, sodium chloride flush  Interim history/subjective:   5/10 -doing really well.  Sitting in the chair.  Oxygen on.  Got BiPAP last night.  Plans are for her to go to inpatient rehab.  Son and daughter at the bedside.  They want sleep consult set up with Dr. Baird Lyons because Baird Lyons takes care of the patient's husband and also takes care of the son for COPD.  Denies ever having been on BiPAP or CPAP or having a diagnosis of sleep apnea or being on oxygen preadmission.  Improving ABG but last ABG 2 days ago.  Still with significant hypercapnia.   Objective   Blood pressure (!) 107/41, pulse 87, temperature 98.4 F (36.9 C), temperature source Oral, resp. rate (!) 23, height 5\' 6"  (1.676 m), weight (!) 145.9 kg, SpO2 95 %.        Intake/Output Summary (Last 24 hours) at 10/24/2019 1036 Last  data filed at 10/23/2019 1400 Gross per 24 hour  Intake 240 ml  Output 1600 ml  Net -1360 ml   Filed Weights   10/21/19 0500 10/22/19 0500 10/24/19 0630  Weight: (!) 151.8 kg (!) 148.1 kg (!) 145.9 kg    Examination: General Appearance:  Looks well. Obese + Head:  Normocephalic, without obvious abnormality, atraumatic Eyes:  PERRL - yes, conjunctiva/corneas - muddy     Ears:  Normal external ear canals, both ears Nose:  G tube - no but has [place] Throat:  ETT TUBE - no , OG tube - no Neck:  Supple,  No  enlargement/tenderness/nodules Lungs: Clear to auscultation bilaterally, Distant breath sound Heart:  S1 and S2 normal, no murmur, CVP - no.  Pressors - no Abdomen:  Soft, no masses, no organomegaly Genitalia / Rectal:  Not done Extremities:  Extremities- intact with chronic edema Skin:  ntact in exposed areas . Sacral area - not examined Neurologic:  Sedation - none, sitting in chair -> RASS - +1 . Moves all 4s - yes. CAM-ICU - neg . Orientation - x3+      Resolved Hospital Problem list   Hyponatremia  Resolved acute metabolic encephalopathy secondary to hypercarbia Assessment & Plan:   Acute on chronic hypoxic and Hypercarbic respiratory failure  Decompensated Diastolic RF Cor Pulmonale  Pulmonary edema and volume overload  Asterixis  AKI Fluid and electrolyte imbalance: hypokalemia    Discussion  Acute on chronic hypoxic and hypercarbic respiratory failure 2/2 untreated OSA w/ decompensated diastolic HF, pulmonary edema and cor pulmonale.  Triggered event was E. coli UTI  10/24/2019: Slowly and steadily improving.  Plan/rec Continue IV diuresis as long as BUN/creatinine and blood pressure will tolerate  -Per tried Continue nocturnal noninvasive ventilation with day time prn  -needs at dc I would ensure she places the NIV on at 10 PM each night We will ask physical therapy to be involved OPD sleep apnea consult   BiPAP Night justification - dw Dr Jenne Pane  was set up for an ABG done on below date and the results showed showed chronic hypercarbia  Recent Labs  Lab 10/18/19 1603 10/18/19 2031 10/19/19 1514 10/22/19 0455  PHART 7.172* 7.219* 7.333* 7.359  PCO2ART 108* 96.2* 81.2* 81.3*  PO2ART 89.3 64.5* 74.7* 99.6  HCO3 38.0* 38.1* 42.0* 44.7*  O2SAT 96.0 90.7 94.8 97.1     [place] has  chronic respiratory failure is due to This   is life threatening and is due to clinical OSA/OHV.  Previous ABG's have documented high PCO2    Patient WILL benefit from non-invasive ventilation.  Without this therapy, the patient is at high risk of ending up with worsening symptoms, worsened respiratory failure, need for ER visits and/or recurrent hospitalizations.  Bilevel device unable to adequately support patient's nocturnal ventilation needs.  Patient would benefit from NIV therapy with set tidal volumes and pressure.     Future Appointments  Date Time Provider [place]  11/04/2019 10:00 AM Baird Lyons D, MD [facility] None  12/01/2019 11:30 AM [facility] LAB 3 [facility] None  12/01/2019 12:00 PM Magrinat, Virgie Dad, MD [facility] None  12/02/2019  2:00 PM Adrian Prows, MD [facility] None     SIGNATURE    Dr. Brand Males, M.D., F.C.C.P,  Pulmonary and Critical Care Medicine Staff Physician, [place] Director - Interstitial Lung Disease  Program  [facility] [place], [place], [address]  Pager: [phone], If no answer or between  15:00h - 7:00h: call [phone] Telephone: [phone]  10:36 AM 10/24/2019    [place]  Lab 10/18/19 1603 10/18/19 2031 10/19/19 1514 10/22/19 0455  PHART 7.172* 7.219* 7.333* 7.359  PCO2ART 108* 96.2* 81.2* 81.3*  PO2ART 89.3 64.5* 74.7* 99.6  HCO3 38.0* 38.1* 42.0* 44.7*  O2SAT 96.0 90.7 94.8 97.1    CBC Recent Labs  Lab 10/17/19 1329 10/21/19 0227 10/24/19 0755  HGB 15.1* 13.8 13.6  HCT 48.7* 46.2* 46.6*  WBC 7.9 7.8 7.0  PLT 232 221 190    COAGULATION No results for input(s): INR in the last 168 hours.  CARDIAC  No results for input(s): TROPONINI in the last 168 hours. No results for input(s): PROBNP in the last 168 hours.   CHEMISTRY Recent Labs  Lab 10/19/19 0211 10/19/19 0211 10/20/19 5035 10/20/19 4656 10/21/19 0227 10/21/19 0227 10/22/19 0204 10/22/19 0204 10/23/19 0350 10/24/19 0259  NA 137   < > 138  --  138  --  137  --  138  137  K 3.2*   < > 2.8*   < > 3.1*   < > 3.1*   < > 3.7 4.1  CL 90*   < > 86*  --  84*  --  84*  --  89* 90*  CO2 36*   < > 40*  --  42*  --  41*  --  38* 37*  GLUCOSE 93   < > 98  --  111*  --  122*  --  117* 113*  BUN 27*   < > 21  --  22  --  25*  --  24* 26*  CREATININE 1.61*   < > 1.43*  --  1.42*  --  1.57*  --  1.39* 1.35*  CALCIUM 8.6*   < > 8.8*  --  8.9  --  9.1  --  9.4 9.0  MG 2.1  --  1.9  --  1.7  --   --   --   --  2.1  PHOS  --   --   --   --  2.1*  --   --   --   --  3.1   < > = values in this interval not displayed.   Estimated Creatinine Clearance: 58.3 mL/min (A) (by C-G formula based on SCr of 1.35 mg/dL (H)).   LIVER Recent Labs  Lab 10/17/19 1329 10/21/19 0227 10/22/19 0204 10/23/19 0350 10/24/19 0259  AST 18 14* 13* 14*  --   ALT 28 19 17 15   --   ALKPHOS 100 74 70 64  --   BILITOT 0.6 1.1 0.8 0.6  --   PROT 7.7 6.5 6.5 6.6  --   ALBUMIN 3.7 3.2* 3.3* 3.2* 3.3*     INFECTIOUS No results for input(s): LATICACIDVEN, PROCALCITON in the last 168 hours.   ENDOCRINE CBG (last 3)  No results for input(s): GLUCAP in the last 72 hours.       IMAGING x48h  - image(s) personally visualized  -   highlighted in bold No results found.

## 2019-10-24 NOTE — Progress Notes (Signed)
Inpatient Rehabilitation Admissions Coordinator  I met with patient with her two daughters at bedside for rehab assessment. We discussed goals and expectations of a possible inpt rehab admit pending insurance approval and bed availability. They prefer CIR. I will begin insurance authorization .  Danne Baxter, RN, MSN Rehab Admissions Coordinator [phone] 10/24/2019 3:35 PM

## 2019-10-24 NOTE — Progress Notes (Signed)
PROGRESS NOTE    Daisy [name]  UXN:[id number] DOB: [birth year] DOA: 10/17/2019 PCP: Alvester Chou, NP    Chief Complaint  Patient presents with  . Shortness of Breath  . Weakness  . Leg Swelling    Brief Narrative:  72 white female estrogen receptor + breast cancer (Dr. Knox Royalty post L lumpectomy/adjuvant XRT 2018 Tobacco + PAD (L duplex 07/17/2015 L ABI 0.68 R ABI 0.61) Class III severe obesity BMI 54 Reflux HTN-Home meds do not include any diuretics Admit 10/17/2019 cramping hands feet (recently received coronavirus 19 vaccination) UA which was a clean-catch in the ED had large leukocyte esterase and nitrites however given her size this was felt to be asymptomatic bacteriuria despite it eventually growing E. coli Circumstantial evidence = DOE, increased fatigue, increased L LE edema CXR = cardiac enlargement, CHF Lasix started in ED, meds adjusted  Assessment & Plan:   Active Problems:   Claudication in peripheral vascular disease (HCC)   Obesity hypoventilation syndrome (HCC)   GERD (gastroesophageal reflux disease)   Acute heart failure (HCC)   History of breast cancer   AKI (acute kidney injury) ([place])   Essential hypertension   Slurred speech   Acute on chronic respiratory failure with hypoxia and hypercapnia ([place])  Cardio-pulm AECHF presumed diastolic-BNP on admission 769 echocardiogram is pending OHSS?  Sleep apnea given habitus-pulmonary hypertension type III Events 5/4 PM noted-patient placed on BiPAP sent to stepdown unit ABG = 7.17 initially now improved to 7.21-likely both a severe restrictive obstructive pattern  Serial ABG have shown improvement--her baseline pCO2=80 IV Lasix 40 twice daily-->Demadex 10 twice daily 5/9 Continuing metoprolol XL 12.5 Currently -15.4 L weight 159-->145.9 kg On discharge hold Naprosyn Entresto resumed 5/10 low-dose and titrate as an outpatient Appreciate critical care input  BiPAP has been better  tolerated 5/7 through 5/9 and she is tolerating really well Case manager consulted for setting up Trelegy ventilator for home if in case she goes home-await their input   Renal Likely cardiorenal syndrome with AKI-Baseline creatinine 08/03/2016 Hypervolemic hyponatremia on admission -resolved -Confounding factor is BMI of 50 Estimated Creatinine Clearance: 58.3 mL/min (A) (by C-G formula based on SCr of 1.35 mg/dL (H)). need to continue diuresing  Bicarb currently 41-Diamox discontinued by PCCM on 5/8 Continue potassium replacement aggressively and this is improved BUN/creatinine stable 27/1.6-->22/1.4-->25/1.5-->24/1.39   FEN Class III morbid obesity BMI >50 Continue volume restriction, education regarding diet, may benefit from further input as an outpatient bariatrics  At risk for aspiration-SLP saw the patient 5/6 recommended reflux medications prior to meals more frequent meals eat slowly and sit up 30 degrees to facilitate esophageal clearing and recommending dysphagia 2 solids thin liquids   DVT prophylaxis: Lovenox Code Status: Full Family Communication: Brief discussion with daughter [name] at the bedside  Disposition: We will need to stay on stepdown until her acute respiratory issues have resolved It appears CIR is looking at the patient for functional status and may revisit on 5/10 candidacy  Status is: Inpatient  Remains inpatient appropriate because:Unsafe d/c plan and Inpatient level of care appropriate due to severity of illness   Dispo: The patient is from: Home              Anticipated d/c is to: SNF or CIR depending on above              Anticipated d/c date is: 2 days              Patient currently is not medically  stable to d/c.    Consultants:   Curbside pulmonology  Procedures: None  Antimicrobials: No  Subjective: Sitting in chair tells me she has radiating pain with neuropathic symptoms in the right leg but has had this before Does use  capsaicin cream at home for this--- is known to have severe degenerative disc disease per her report  Objective: Vitals:   10/24/19 0800 10/24/19 0945 10/24/19 1200 10/24/19 1318  BP:  (!) 119/44  (!) 109/39  Pulse:  89  85  Resp:  (!) 21  18  Temp: 98.4 F (36.9 C)  97.6 F (36.4 C)   TempSrc: Oral  Oral   SpO2:  91%  94%  Weight:      Height:       No intake or output data in the 24 hours ending 10/24/19 1452 Filed Weights   10/21/19 0500 10/22/19 0500 10/24/19 0630  Weight: (!) 151.8 kg (!) 148.1 kg (!) 145.9 kg    Examination:  Mallampati 4 no JVD thick neck EOMI NCAT on nasal oxygen sitting in chair seems comfortable S1-S2 no murmur Monitor show PVCs sinus rhythm predominantly Abdomen soft significantly obese poor exam Chest clear, no crepitation Neurologically intact no focal deficit 5/5 with no hyperreflexia- Points to right upper extremity and thigh with pain does not seem to involve knee joint   Data Reviewed: I have personally reviewed following labs and imaging studies Potassium 3.2--->2.8-->3.1-->3.7-->-->4.1 Bicarb 36--->40-->42-->41-->38-->37 BUN/creatinine 27/1.6--->21/1.43->22/1.4-->24/1.39-->26/1.35 Hemoglobin 13.6 platelet 190  Radiology Studies: DG CHEST PORT 1 VIEW  Result Date: 10/24/2019 CLINICAL DATA:  Shortness of breath. EXAM: PORTABLE CHEST 1 VIEW COMPARISON:  Oct 18, 2019. FINDINGS: Stable cardiomediastinal silhouette. No pneumothorax is noted. Mild bibasilar subsegmental atelectasis or edema is noted. Small pleural effusions may be present. Bony thorax is unremarkable. IMPRESSION: Mild bibasilar subsegmental atelectasis or edema is noted with possible small pleural effusions. Aortic Atherosclerosis (ICD10-I70.0). Electronically Signed   By: Marijo Conception M.D.   On: 10/24/2019 11:36    Scheduled Meds: . anastrozole  1 mg Oral Daily  . aspirin  81 mg Oral Daily  . chlorhexidine  15 mL Mouth Rinse BID  . Chlorhexidine Gluconate Cloth  6 each  Topical Daily  . cilostazol  100 mg Oral BID  . [START ON 10/25/2019] enoxaparin (LOVENOX) injection  70 mg Subcutaneous Daily  . mouth rinse  15 mL Mouth Rinse q12n4p  . metoprolol succinate  12.5 mg Oral Daily  . multivitamin  1 tablet Oral Daily  . nicotine  7 mg Transdermal Daily  . pantoprazole  40 mg Oral Daily  . potassium chloride  40 mEq Oral BID  . sacubitril-valsartan  1 tablet Oral BID  . sodium chloride flush  3 mL Intravenous Q12H  . torsemide  10 mg Oral BID   Continuous Infusions: . sodium chloride       LOS: 7 days    Time spent: 25  Nita Sells, MD [facility]  To contact the attending provider between 7A-7P or the covering provider during after hours 7P-7A, please log into the web site [email or web address] and access using universal [place] password for that web site. If you do not have the password, please call the hospital operator.  10/24/2019, 2:52 PM

## 2019-10-24 NOTE — Progress Notes (Signed)
Physical Therapy Treatment Patient Details Name: Daisy [name] MRN: [id number] DOB: [birth year] Today's Date: 10/24/2019    History of Present Illness 69 yo female with onset of acute SOB and fatigue with alteration of mobility , hand and foot cramps.  Noted significant LE edema,PMHx:  OA, cholecystitis, depression, dyspnea, HLD, L breast CA, PVD, PNA, rotator cuff repair.    PT Comments    The patient is very alert and participatory this visit. Patient on 3 L [place] resting at 95%. Patient ambulated x 25' then 20" which was limited when knees noted to slightly buckle. Recliner was followed closely behind. This  Is first time walking since admission.Patient's HR 90-110. Patient reports "sciatica" right leg with reports of numbness. Patient will benefit from CIR for continued rehab to return  To home  Follow Up Recommendations  CIR;Supervision/Assistance - 24 hour     Equipment Recommendations  (4 wheeled Rw)    Recommendations for Other Services       Precautions / Restrictions Precautions Precautions: Fall Precaution Comments: monitor sats/HR    Mobility  Bed Mobility               General bed mobility comments: in recliner, sitting upright  Transfers   Equipment used: Rolling walker (2 wheeled) Transfers: Sit to/from [facility] Sit to Stand: Min assist Stand pivot transfers: Min assist       General transfer comment: stands from recliner with min assist, transfer to [facility] then back to recliner.  Ambulation/Gait Ambulation/Gait assistance: Min assist;+2 safety/equipment Gait Distance (Feet): 25 Feet(then 20') Assistive device: Rolling walker (2 wheeled) Gait Pattern/deviations: Step-through pattern;Wide base of support;Decreased stride length Gait velocity: decr   General Gait Details: pt on 3 l [place]. noted mild dyspnea2/4 first time, then 3/4, SPO2 91% and > HR 110 max   Stairs             Wheelchair Mobility    Modified Rankin  (Stroke Patients Only)       Balance   Sitting-balance support: Feet supported;Single extremity supported;No upper extremity supported Sitting balance-Leahy Scale: Good     Standing balance support: Bilateral upper extremity supported;During functional activity Standing balance-Leahy Scale: Fair                              Cognition Arousal/Alertness: Awake/alert Behavior During Therapy: WFL for tasks assessed/performed Overall Cognitive Status: Within Functional Limits for tasks assessed                                 General Comments: very alert, talkative and participatory      Exercises      General Comments        Pertinent Vitals/Pain Pain Assessment: No/denies pain    Home Living                      Prior Function            PT Goals (current goals can now be found in the care plan section) Progress towards PT goals: Progressing toward goals    Frequency    Min 3X/week      PT Plan Current plan remains appropriate    Co-evaluation              AM-PAC PT "6 Clicks" Mobility   Outcome Measure  Help needed turning from  your back to your side while in a flat bed without using bedrails?: A Little Help needed moving from lying on your back to sitting on the side of a flat bed without using bedrails?: A Little Help needed moving to and from a bed to a chair (including a wheelchair)?: A Little Help needed standing up from a chair using your arms (e.g., wheelchair or bedside chair)?: A Little Help needed to walk in hospital room?: A Lot Help needed climbing 3-5 steps with a railing? : Total 6 Click Score: 15    End of Session Equipment Utilized During Treatment: Oxygen;Gait belt Activity Tolerance: Patient tolerated treatment well Patient left: in chair;with call bell/phone within reach;with family/visitor present Nurse Communication: Mobility status;Other (comment) PT Visit Diagnosis: Muscle weakness  (generalized) (M62.81);Other abnormalities of gait and mobility (R26.89);Difficulty in walking, not elsewhere classified (R26.2)     Time: 1430-1505 PT Time Calculation (min) (ACUTE ONLY): 35 min  Charges:  $Gait Training: 23-37 mins                       [place] Pager [phone] Office [phone]    Claretha Cooper 10/24/2019, 3:29 PM

## 2019-10-24 NOTE — Progress Notes (Signed)
Inpatient Rehabilitation Admissions Coordinator  I contacted patient's daughter, [name] by phone to let her know that I will be seeing patient today about 3 to 3:30 to invite the daughters to be present if they would prefer.   Danne Baxter, RN, MSN Rehab Admissions Coordinator [phone] 10/24/2019 8:11 AM

## 2019-10-24 NOTE — TOC Initial Note (Signed)
Transition of Care [facility]) - Initial/Assessment Note    Patient Details  Name: Daisy [name] MRN: [id number] Date of Birth: 12/14/1950  Transition of Care [facility]) CM/SW Contact:    Leeroy Cha, RN Phone Number: 10/24/2019, 9:18 AM  Clinical Narrative:                 Patient to be seen by CIR today between 3-330pm  Expected Discharge Plan: [place] Barriers to Discharge: Continued Medical Work up   Patient Goals and CMS Choice Patient states their goals for this hospitalization and ongoing recovery are:: to go to rehab CMS Medicare.gov Compare Post Acute Care list provided to:: Patient Choice offered to / list presented to : Patient  Expected Discharge Plan and Services Expected Discharge Plan: [place]   Discharge Planning Services: CM Consult   Living arrangements for the past 2 months: Single Family Home                                      Prior Living Arrangements/Services Living arrangements for the past 2 months: Single Family Home Lives with:: Spouse Patient language and need for interpreter reviewed:: No Do you feel safe going back to the place where you live?: Yes      Need for Family Participation in Patient Care: Yes (Comment) Care giver support system in place?: Yes (comment)   Criminal Activity/Legal Involvement Pertinent to Current Situation/Hospitalization: No - Comment as needed  Activities of Daily Living Home Assistive Devices/Equipment: Eyeglasses, Walker (specify type)(standard and rolling walker) ADL Screening (condition at time of admission) Patient's cognitive ability adequate to safely complete daily activities?: Yes Is the patient deaf or have difficulty hearing?: No Does the patient have difficulty seeing, even when wearing glasses/contacts?: No Does the patient have difficulty concentrating, remembering, or making decisions?: No Patient able to express need for assistance with ADLs?: Yes Does the patient  have difficulty dressing or bathing?: Yes Independently performs ADLs?: No Communication: Independent Dressing (OT): Needs assistance Is this a change from baseline?: Change from baseline, expected to last >3 days Grooming: Independent Is this a change from baseline?: Pre-admission baseline Feeding: Independent Bathing: Needs assistance Is this a change from baseline?: Change from baseline, expected to last >3 days Toileting: Needs assistance Is this a change from baseline?: Change from baseline, expected to last >3days In/Out Bed: Needs assistance Is this a change from baseline?: Change from baseline, expected to last >3 days Walks in Home: Needs assistance(SOB with exertion) Is this a change from baseline?: Change from baseline, expected to last >3 days Does the patient have difficulty walking or climbing stairs?: Yes Weakness of Legs: Both(c/o numbness) Weakness of Arms/Hands: Both  Permission Sought/Granted                  Emotional Assessment Appearance:: Appears stated age Attitude/Demeanor/Rapport: Engaged Affect (typically observed): Calm Orientation: : Oriented to Place, Oriented to  Time, Oriented to Self, Oriented to Situation Alcohol / Substance Use: Not Applicable Psych Involvement: No (comment)  Admission diagnosis:  Acute heart failure (HCC) [I50.9] CHF (congestive heart failure) ([place]) [I50.9] Urinary tract infection with hematuria, site unspecified [N39.0, R31.9] New onset of congestive heart failure ([place]) [I50.9] Patient Active Problem List   Diagnosis Date Noted  . Acute on chronic respiratory failure with hypoxia and hypercapnia ([place]) 10/22/2019  . Slurred speech   . Acute heart failure ([place]) 10/17/2019  .  History of breast cancer 10/17/2019  . AKI (acute kidney injury) ([place]) 10/17/2019  . Essential hypertension 10/17/2019  . Urinary tract infection with hematuria   . Breast cancer of upper-outer quadrant of right female breast ([place]) 11/19/2016  .  Malignant neoplasm of upper-outer quadrant of left breast in female, estrogen receptor positive ([place]) 09/30/2016  . Claudication in peripheral vascular disease ([place]) 04/04/2014  . Obesity hypoventilation syndrome ([place]) 04/04/2014  . GERD (gastroesophageal reflux disease) 04/04/2014   PCP:  Alvester Chou, NP Pharmacy:   [id number] - , [place] [address] [place] [address] Phone: [phone] Fax: [phone]     Social Determinants of Health (SDOH) Interventions    Readmission Risk Interventions No flowsheet data found.

## 2019-10-25 LAB — CBC WITH DIFFERENTIAL/PLATELET
Abs Immature Granulocytes: 0.03 10*3/uL (ref 0.00–0.07)
Basophils Absolute: 0 10*3/uL (ref 0.0–0.1)
Basophils Relative: 0 %
Eosinophils Absolute: 0.2 10*3/uL (ref 0.0–0.5)
Eosinophils Relative: 3 %
HCT: 45.2 % (ref 36.0–46.0)
Hemoglobin: 13.4 g/dL (ref 12.0–15.0)
Immature Granulocytes: 0 %
Lymphocytes Relative: 19 %
Lymphs Abs: 1.4 10*3/uL (ref 0.7–4.0)
MCH: 30 pg (ref 26.0–34.0)
MCHC: 29.6 g/dL — ABNORMAL LOW (ref 30.0–36.0)
MCV: 101.1 fL — ABNORMAL HIGH (ref 80.0–100.0)
Monocytes Absolute: 0.7 10*3/uL (ref 0.1–1.0)
Monocytes Relative: 10 %
Neutro Abs: 4.9 10*3/uL (ref 1.7–7.7)
Neutrophils Relative %: 68 %
Platelets: 173 10*3/uL (ref 150–400)
RBC: 4.47 MIL/uL (ref 3.87–5.11)
RDW: 14.6 % (ref 11.5–15.5)
WBC: 7.3 10*3/uL (ref 4.0–10.5)
nRBC: 0 % (ref 0.0–0.2)

## 2019-10-25 LAB — COMPREHENSIVE METABOLIC PANEL
ALT: 16 U/L (ref 0–44)
AST: 19 U/L (ref 15–41)
Albumin: 3.1 g/dL — ABNORMAL LOW (ref 3.5–5.0)
Alkaline Phosphatase: 61 U/L (ref 38–126)
Anion gap: 9 (ref 5–15)
BUN: 28 mg/dL — ABNORMAL HIGH (ref 8–23)
CO2: 36 mmol/L — ABNORMAL HIGH (ref 22–32)
Calcium: 8.9 mg/dL (ref 8.9–10.3)
Chloride: 91 mmol/L — ABNORMAL LOW (ref 98–111)
Creatinine, Ser: 1.55 mg/dL — ABNORMAL HIGH (ref 0.44–1.00)
GFR calc Af Amer: 39 mL/min — ABNORMAL LOW (ref >60)
GFR calc non Af Amer: 34 mL/min — ABNORMAL LOW (ref >60)
Glucose, Bld: 111 mg/dL — ABNORMAL HIGH (ref 70–99)
Potassium: 4.1 mmol/L (ref 3.5–5.1)
Sodium: 136 mmol/L (ref 135–145)
Total Bilirubin: 0.6 mg/dL (ref 0.3–1.2)
Total Protein: 6.3 g/dL — ABNORMAL LOW (ref 6.5–8.1)

## 2019-10-25 LAB — BLOOD GAS, ARTERIAL
Acid-Base Excess: 10.2 mmol/L — ABNORMAL HIGH (ref 0.0–2.0)
Bicarbonate: 38.3 mmol/L — ABNORMAL HIGH (ref 20.0–28.0)
FIO2: 45
O2 Saturation: 95 %
Patient temperature: 98.3
pCO2 arterial: 69.9 mmHg (ref 32.0–48.0)
pH, Arterial: 7.356 (ref 7.350–7.450)
pO2, Arterial: 78.1 mmHg — ABNORMAL LOW (ref 83.0–108.0)

## 2019-10-25 MED ORDER — NOREPINEPHRINE 4 MG/250ML-% IV SOLN
2.0000 ug/min | INTRAVENOUS | Status: DC
  Administered 2019-10-25: 2 ug/min via INTRAVENOUS

## 2019-10-25 MED ORDER — CAPSAICIN 0.025 % EX CREA
TOPICAL_CREAM | Freq: Two times a day (BID) | CUTANEOUS | Status: DC
  Administered 2019-10-26: 1 via TOPICAL
  Filled 2019-10-25: qty 60

## 2019-10-25 MED ORDER — NOREPINEPHRINE 4 MG/250ML-% IV SOLN: 0.0000 ug/min | INTRAVENOUS | Status: DC

## 2019-10-25 MED ORDER — SODIUM CHLORIDE 0.9 % IV SOLN: 250.0000 mL | INTRAVENOUS | Status: DC

## 2019-10-25 MED ORDER — SODIUM CHLORIDE 0.9 % IV SOLN
250.0000 mL | INTRAVENOUS | Status: DC
  Administered 2019-10-25: 03:00:00 250 mL via INTRAVENOUS

## 2019-10-25 MED ORDER — NOREPINEPHRINE 4 MG/250ML-% IV SOLN
INTRAVENOUS | Status: AC
  Filled 2019-10-25: qty 250

## 2019-10-25 NOTE — Progress Notes (Addendum)
PROGRESS NOTE    Daisy [name]  PZW:[id number] DOB: 08/14/1950 DOA: 10/17/2019 PCP: Alvester Chou, NP    Chief Complaint  Patient presents with  . Shortness of Breath  . Weakness  . Leg Swelling    Brief Narrative:  62 white female estrogen receptor + breast cancer (Dr. Knox Royalty post L lumpectomy/adjuvant XRT 2018 Tobacco + PAD (L duplex 07/17/2015 L ABI 0.68 R ABI 0.61) Class III severe obesity BMI 54 Reflux HTN-Home meds do not include any diuretics Admit 10/17/2019 cramping hands feet (recently received coronavirus 19 vaccination) UA which was a clean-catch in the ED had large leukocyte esterase and nitrites however given her size this was felt to be asymptomatic bacteriuria despite it eventually growing E. coli Circumstantial evidence = DOE, increased fatigue, increased L LE edema CXR = cardiac enlargement, CHF Lasix started in ED, meds adjusted  Diuresed well-seen by pulmonology meds adjusted started on BiPAP and nearing discharge when insurance is available and patient can be taken to CIR  Assessment & Plan:   Active Problems:   Claudication in peripheral vascular disease (HCC)   Obesity hypoventilation syndrome (HCC)   GERD (gastroesophageal reflux disease)   Acute heart failure (HCC)   History of breast cancer   AKI (acute kidney injury) ([place])   Essential hypertension   Slurred speech   Acute on chronic respiratory failure with hypoxia and hypercapnia ([place])  Cardio-pulm AECHF presumed diastolic-BNP on admission 769 echocardiogram poor quality 2/2 to size 5/4 OHSS?  Sleep apnea given habitus-pulmonary hypertension type III Events 5/4 PM noted-patient placed on BiPAP sent to stepdown unit ABG = 7.17 initially now improved to 7.21-likely both a severe restrictive obstructive pattern  Serial ABG have shown improvement--her baseline pCO2=69 IV Lasix 40 twice daily-->Demadex 10 twice daily 5/9 Continuing metoprolol XL 12.5 Currently -15.4 L weight  159-->145.9 kg On discharge hold Naprosyn Entresto was initiated on 5/10 however blood pressures dropped with her map into the 60s-for some reason Levophed was started and temporarily kept on and discontinued on 5/11-we will need to ensure patient remains hemodynamically stable prior to discharge Appreciate critical care input  BiPAP has been better tolerated 5/7 through 5/9 and she is tolerating really well Case manager consulted for setting up Trelegy ventilator for home if in case she goes home-await their input.  Renal Likely cardiorenal syndrome with AKI-Baseline creatinine 08/03/2016 Hypervolemic hyponatremia on admission -resolved -Confounding factor is BMI of 50 Estimated Creatinine Clearance: 50.8 mL/min (A) (by C-G formula based on SCr of 1.55 mg/dL (H)). need to continue diuresing, but is 145.9 KG below 146 kg [baseline weight OV 08/31/2019] Bicarb currently 41-Diamox discontinued by PCCM on 5/8 Continue potassium replacement aggressively and this is improved BUN/creatinine stable 27/1.6-->22/1.4-->25/1.5-->24/1.39-->28/1.55 Adjust torsemide in am downwards if creat increases further  FEN Class III morbid obesity BMI >50 Continue volume restriction, education regarding diet, may benefit from further input as an outpatient bariatrics  At risk for aspiration-SLP saw the patient 5/6 recommended reflux medications prior to meals more frequent meals eat slowly and sit up 30 degrees to facilitate esophageal clearing and recommending dysphagia 2 solids thin liquids  DVT prophylaxis: Lovenox Code Status: Full Family Communication: Discussed at bedside with [name] daughter  Disposition: We will need to stay on stepdown 2/2 need for PRN bipap Likely d/c to CIR soon?  Status is: Inpatient  Remains inpatient appropriate because:Unsafe d/c plan and Inpatient level of care appropriate due to severity of illness   Dispo: The patient is from: Home  Anticipated d/c is to:CIR                 Anticipated d/c date is: 1 day              Patient currently is not medically stable to d/c.   Consultants:    pulmonology  Procedures: None  Antimicrobials: No  Subjective: Patient was started for some reason on Levophed drip last night?? It was turned off at 10:45 AM and she is now doing fair Pressures are in the 120s I think this may have been because of the Entresto I started which was a home med for her as I was getting her up for discharge She currently looks well she is eating and drinking and her daughters in the room She states she is breathing well and tolerating the BiPAP very well  Objective: Vitals:   10/25/19 1015 10/25/19 1030 10/25/19 1119 10/25/19 1200  BP: (!) 123/43 (!) 135/56 (!) 124/56 117/67  Pulse: 88 84 90 92  Resp: 15 (!) 26 (!) 24 17  Temp:      TempSrc:      SpO2: 93% 92% 93% 98%  Weight:      Height:        Intake/Output Summary (Last 24 hours) at 10/25/2019 1240 Last data filed at 10/25/2019 1200 Gross per 24 hour  Intake 135.61 ml  Output 500 ml  Net -364.39 ml   Filed Weights   10/22/19 0500 10/24/19 0630 10/25/19 0500  Weight: (!) 148.1 kg (!) 145.9 kg (!) 145.9 kg    Examination:  Mallampati 4 no JVD thick neck EOMI NCAT on nasal oxygen sitting in chair seems comfortable S1-S2 no murmur Monitor show PVCs sinus rhythm predominantly Abdomen soft significantly obese poor exam Chest clear, no crepitation Neurologically intact no focal deficit 5/5 with no hyperreflexia- Did not examine skin  Data Reviewed: I have personally reviewed following labs and imaging studies PCO2 on ABG 69.9 down from 80 which was done previously-this may be her baseline Potassium 3.2--->2.8-->3.1-->3.7-->-->4.1 Bicarb 36--->40-->42-->41-->38-->37--36 BUN/creatinine 27/1.6--->21/1.43->22/1.4-->24/1.39-->26/1.35-->28/1.55 Hemoglobin 13.4 platelet 173  Radiology Studies: DG CHEST PORT 1 VIEW  Result Date: 10/24/2019 CLINICAL DATA:   Shortness of breath. EXAM: PORTABLE CHEST 1 VIEW COMPARISON:  Oct 18, 2019. FINDINGS: Stable cardiomediastinal silhouette. No pneumothorax is noted. Mild bibasilar subsegmental atelectasis or edema is noted. Small pleural effusions may be present. Bony thorax is unremarkable. IMPRESSION: Mild bibasilar subsegmental atelectasis or edema is noted with possible small pleural effusions. Aortic Atherosclerosis (ICD10-I70.0). Electronically Signed   By: Marijo Conception M.D.   On: 10/24/2019 11:36   Scheduled Meds: . anastrozole  1 mg Oral Daily  . aspirin  81 mg Oral Daily  . chlorhexidine  15 mL Mouth Rinse BID  . Chlorhexidine Gluconate Cloth  6 each Topical Daily  . cilostazol  100 mg Oral BID  . enoxaparin (LOVENOX) injection  70 mg Subcutaneous Daily  . mouth rinse  15 mL Mouth Rinse q12n4p  . metoprolol succinate  12.5 mg Oral Daily  . multivitamin  1 tablet Oral Daily  . nicotine  7 mg Transdermal Daily  . pantoprazole  40 mg Oral Daily  . potassium chloride  40 mEq Oral BID  . sodium chloride flush  3 mL Intravenous Q12H  . torsemide  10 mg Oral BID   Continuous Infusions: . sodium chloride    . sodium chloride    . sodium chloride 10 mL/hr at 10/25/19 1200     LOS: 8  days  Time spent: [place], MD [facility]  To contact the attending provider between 7A-7P or the covering provider during after hours 7P-7A, please log into the web site [email or web address] and access using universal [place] password for that web site. If you do not have the password, please call the hospital operator.  10/25/2019, 12:40 PM

## 2019-10-25 NOTE — Progress Notes (Addendum)
Occupational Therapy Treatment Patient Details Name: Daisy [name] MRN: [id number] DOB: 1950-06-22 Today's Date: 10/25/2019    History of present illness 70 yo female with onset of acute SOB and fatigue with alteration of mobility , hand and foot cramps.  Noted significant LE edema,PMHx:  OA, cholecystitis, depression, dyspnea, HLD, L breast CA, PVD, PNA, rotator cuff repair.requires BiPAP for hypercabic respiratory failure.   OT comments  This 69 yo female admitted with above presents to acute OT today making good gains with basic ADLs and tolerance for activity. She was able to ambulated to [facility] over toilet in bathroom with min A +2 (safety/lines/tube), stand with min guard A to A with toileting hygiene (total A--back peri area), stand at sink for two grooming tasks with a break in between the two, and sit to comb hair.  Pt is very motivated to get better, get so rehab, and get home to be able to take care of herself. Pt not at a point that she will be able to tolerate OT/PT separately and safely.  Pt will continue to benefit from acute OT with follow up on CIR.  Pt currently is on 6 liters of O2 with sats 92-93%, pt briefly took her O2 off to wash face and sats dropped to 86% in less than 30 seconds, had pt reapply O2. HR as high as 110.   Follow Up Recommendations  CIR;Supervision - Intermittent    Equipment Recommendations  Other (comment)(TBD next venue)    Recommendations for Other Services Rehab consult    Precautions / Restrictions Precautions Precautions: Fall Precaution Comments: monitor sats/HR Restrictions Weight Bearing Restrictions: No       Mobility Bed Mobility               General bed mobility comments: in recliner, sitting upright  Transfers Overall transfer level: Needs assistance Equipment used: Rolling walker (2 wheeled) Transfers: Sit to/from Stand Sit to Stand: Min guard         General transfer comment: multiple practices from  recliner then [facility] during functional ADL's    Balance   Sitting-balance support: Feet supported;No upper extremity supported Sitting balance-Leahy Scale: Good     Standing balance support: Single extremity supported;During functional activity Standing balance-Leahy Scale: Fair Standing balance comment: propping for ADL's to be performed with OT                           ADL either performed or assessed with clinical judgement   ADL Overall ADL's : Needs assistance/impaired     Grooming: Min guard;Wash/dry face;Oral care;Brushing hair;Sitting;Standing Grooming Details (indicate cue type and reason): Pt stood to brush her teeth at sink, then had ~1-2 minute rest break, then stood and washed her face, then sat and combed her hair                 Toilet Transfer: Minimal assistance;+2 for safety/equipment;Ambulation;RW;BSC(over toilet)   Toileting- Clothing Manipulation and Hygiene: Total assistance Toileting - Clothing Manipulation Details (indicate cue type and reason): Pt able to stand with min guard A to help with this task             Vision Patient Visual Report: No change from baseline            Cognition Arousal/Alertness: Awake/alert Behavior During Therapy: WFL for tasks assessed/performed Overall Cognitive Status: Within Functional Limits for tasks assessed  Pertinent Vitals/ Pain       Faces Pain Scale: Hurts little more Pain Location: right leg to back Pain Descriptors / Indicators: Discomfort;Numbness;Radiating Pain Intervention(s): Monitored during session         Frequency  Min 2X/week        Progress Toward Goals  OT Goals(current goals can now be found in the care plan section)  Progress towards OT goals: Progressing toward goals  Acute Rehab OT Goals Patient Stated Goal: to be independent and go home and take care of myself OT Goal Formulation:  With patient Time For Goal Achievement: 11/05/19 Potential to Achieve Goals: Good  Plan Discharge plan remains appropriate    Co-evaluation    PT/OT/SLP Co-Evaluation/Treatment: Yes Reason for Co-Treatment: For patient/therapist safety;To address functional/ADL transfers PT goals addressed during session: Mobility/safety with mobility OT goals addressed during session: ADL's and self-care;Strengthening/ROM      AM-PAC OT "6 Clicks" Daily Activity     Outcome Measure   Help from another person eating meals?: None Help from another person taking care of personal grooming?: A Little Help from another person toileting, which includes using toliet, bedpan, or urinal?: A Lot Help from another person bathing (including washing, rinsing, drying)?: A Lot Help from another person to put on and taking off regular upper body clothing?: A Little Help from another person to put on and taking off regular lower body clothing?: A Lot 6 Click Score: 16    End of Session Equipment Utilized During Treatment: Gait belt;Rolling walker;Oxygen(6 liters)  OT Visit Diagnosis: Muscle weakness (generalized) (M62.81);History of falling (Z91.81)   Activity Tolerance Patient tolerated treatment well   Patient Left in chair;with call bell/phone within reach;with chair alarm set   Nurse Communication          Time: [phone] OT Time Calculation (min): 31 min  Charges: OT General Charges $OT Visit: 1 Visit OT Treatments $Self Care/Home Management : 8-22 mins  [place], OTR/L Acute [employer] Pager [phone] Office [phone]      Almon Register 10/25/2019, 10:14 AM

## 2019-10-25 NOTE — TOC Progression Note (Signed)
Transition of Care [facility]) - Progression Note    Patient Details  Name: Idabell Picking MRN: [id number] Date of Birth: 10/22/1950  Transition of Care [facility]) CM/SW Contact  Leeroy Cha, RN Phone Number: 10/25/2019, 10:03 AM  Clinical Narrative:    Awaiting insurance auth and placement into CIR, patient is a candidate for CIR but no beds are available .    Expected Discharge Plan: IP Rehab Facility Barriers to Discharge: Insurance Authorization  Expected Discharge Plan and Services Expected Discharge Plan: [place]   Discharge Planning Services: CM Consult   Living arrangements for the past 2 months: Single Family Home                                       Social Determinants of Health (SDOH) Interventions    Readmission Risk Interventions No flowsheet data found.

## 2019-10-25 NOTE — Progress Notes (Signed)
Physical Therapy Treatment Patient Details Name: Daisy [name] MRN: [id number] DOB: 08-17-1950 Today's Date: 10/25/2019    History of Present Illness 69 yo female with onset of acute SOB and fatigue with alteration of mobility , hand and foot cramps.  Noted significant LE edema,PMHx:  OA, cholecystitis, depression, dyspnea, HLD, L breast CA, PVD, PNA, rotator cuff repair.requires BiPAP for hypercabic respiratory failure.    PT Comments    The patient is progressing with functional mobility. Patient performed ambulation of 25' then 20' and ADL's using 6 L [place](from4 L at  Rest) with SPO2 remaining ~ 93%, Max HR 110. The patient required seated rest breaks x 3 during ADL's. She can benefit and tolerate CIR.  Patient was on RA briefly with SPO2 dropping to 87%.  Follow Up Recommendations  CIR;Supervision/Assistance - 24 hour     Equipment Recommendations  (TBD nex venue)    Recommendations for Other Services Rehab consult     Precautions / Restrictions Precautions Precautions: Fall Precaution Comments: monitor sats/HR Restrictions Weight Bearing Restrictions: No    Mobility  Bed Mobility               General bed mobility comments: in recliner, sitting upright  Transfers Overall transfer level: Needs assistance Equipment used: Rolling walker (2 wheeled) Transfers: Sit to/from Stand Sit to Stand: Min guard         General transfer comment: multiple practices from reclioner then [facility] during functional ADL's  Ambulation/Gait Ambulation/Gait assistance: Min assist;+2 safety/equipment Gait Distance (Feet): 25 Feet(then 20') Assistive device: Rolling walker (2 wheeled) Gait Pattern/deviations: Decreased stride length;Wide base of support;Trunk flexed Gait velocity: decr   General Gait Details: cues for posture , tends to push RW far ahead, gait is slow.   Stairs             Wheelchair Mobility    Modified Rankin (Stroke Patients Only)        Balance   Sitting-balance support: Feet supported;No upper extremity supported Sitting balance-Leahy Scale: Good     Standing balance support: Single extremity supported;During functional activity Standing balance-Leahy Scale: Fair Standing balance comment: propping for ADL's to be performed with OT                            Cognition Arousal/Alertness: Awake/alert Behavior During Therapy: WFL for tasks assessed/performed Overall Cognitive Status: Within Functional Limits for tasks assessed                                        Exercises      General Comments        Pertinent Vitals/Pain Faces Pain Scale: Hurts little more Pain Location: right leg to back Pain Descriptors / Indicators: Discomfort;Numbness;Radiating Pain Intervention(s): Monitored during session    Home Living                      Prior Function            PT Goals (current goals can now be found in the care plan section) Progress towards PT goals: Progressing toward goals    Frequency    Min 3X/week      PT Plan Current plan remains appropriate    Co-evaluation PT/OT/SLP Co-Evaluation/Treatment: Yes Reason for Co-Treatment: For patient/therapist safety;To address functional/ADL transfers PT goals addressed during session: Mobility/safety with mobility  OT goals addressed during session: ADL's and self-care      AM-PAC PT "6 Clicks" Mobility   Outcome Measure  Help needed turning from your back to your side while in a flat bed without using bedrails?: A Little Help needed moving from lying on your back to sitting on the side of a flat bed without using bedrails?: A Little Help needed moving to and from a bed to a chair (including a wheelchair)?: A Little Help needed standing up from a chair using your arms (e.g., wheelchair or bedside chair)?: A Little Help needed to walk in hospital room?: A Lot Help needed climbing 3-5 steps with a railing? :  Total 6 Click Score: 15    End of Session Equipment Utilized During Treatment: Oxygen;Gait belt Activity Tolerance: Patient tolerated treatment well Patient left: in chair;with call bell/phone within reach;with chair alarm set Nurse Communication: Mobility status PT Visit Diagnosis: Muscle weakness (generalized) (M62.81);Other abnormalities of gait and mobility (R26.89);Difficulty in walking, not elsewhere classified (R26.2)     Time: 4536-4680 PT Time Calculation (min) (ACUTE ONLY): 37 min  Charges:  $Gait Training: 8-22 mins                     [place] Pager [phone] Office [phone]    Claretha Cooper 10/25/2019, 10:02 AM

## 2019-10-26 LAB — CBC WITH DIFFERENTIAL/PLATELET
Abs Immature Granulocytes: 0.01 10*3/uL (ref 0.00–0.07)
Basophils Absolute: 0 10*3/uL (ref 0.0–0.1)
Basophils Relative: 1 %
Eosinophils Absolute: 0.2 10*3/uL (ref 0.0–0.5)
Eosinophils Relative: 3 %
HCT: 44.8 % (ref 36.0–46.0)
Hemoglobin: 13.3 g/dL (ref 12.0–15.0)
Immature Granulocytes: 0 %
Lymphocytes Relative: 17 %
Lymphs Abs: 1.1 10*3/uL (ref 0.7–4.0)
MCH: 29.8 pg (ref 26.0–34.0)
MCHC: 29.7 g/dL — ABNORMAL LOW (ref 30.0–36.0)
MCV: 100.4 fL — ABNORMAL HIGH (ref 80.0–100.0)
Monocytes Absolute: 0.6 10*3/uL (ref 0.1–1.0)
Monocytes Relative: 9 %
Neutro Abs: 4.7 10*3/uL (ref 1.7–7.7)
Neutrophils Relative %: 70 %
Platelets: 164 10*3/uL (ref 150–400)
RBC: 4.46 MIL/uL (ref 3.87–5.11)
RDW: 14.8 % (ref 11.5–15.5)
WBC: 6.6 10*3/uL (ref 4.0–10.5)
nRBC: 0 % (ref 0.0–0.2)

## 2019-10-26 LAB — COMPREHENSIVE METABOLIC PANEL
ALT: 17 U/L (ref 0–44)
AST: 18 U/L (ref 15–41)
Albumin: 3.1 g/dL — ABNORMAL LOW (ref 3.5–5.0)
Alkaline Phosphatase: 61 U/L (ref 38–126)
Anion gap: 9 (ref 5–15)
BUN: 31 mg/dL — ABNORMAL HIGH (ref 8–23)
CO2: 34 mmol/L — ABNORMAL HIGH (ref 22–32)
Calcium: 8.9 mg/dL (ref 8.9–10.3)
Chloride: 93 mmol/L — ABNORMAL LOW (ref 98–111)
Creatinine, Ser: 1.49 mg/dL — ABNORMAL HIGH (ref 0.44–1.00)
GFR calc Af Amer: 41 mL/min — ABNORMAL LOW (ref >60)
GFR calc non Af Amer: 35 mL/min — ABNORMAL LOW (ref >60)
Glucose, Bld: 128 mg/dL — ABNORMAL HIGH (ref 70–99)
Potassium: 4.4 mmol/L (ref 3.5–5.1)
Sodium: 136 mmol/L (ref 135–145)
Total Bilirubin: 0.9 mg/dL (ref 0.3–1.2)
Total Protein: 6.3 g/dL — ABNORMAL LOW (ref 6.5–8.1)

## 2019-10-26 NOTE — Progress Notes (Signed)
  Speech Language Pathology Treatment: Dysphagia  Patient Details Name: Daisy [name] MRN: [id number] DOB: Aug 19, 1950 Today's Date: 10/26/2019 Time: 1660-6301 SLP Time Calculation (min) (ACUTE ONLY): 15 min  Assessment / Plan / Recommendation Clinical Impression  Pt seen for follow up for education regarding safe swallow and management of esophageal dysmotility. Pt reports no difficulty with liquids or solids, and is tolerating meds whole with liquid per RN. Will advance diet to regular textures (cardiac restrictions per MD). Safe swallow precautions updated at [facility], and written strategies for management of esophageal dysmotility reviewed with pt. RN aware of above. ST signing off at this time. Please reconsult if needs arise.    HPI HPI: Daisy [name] is a 69 y.o. female with a history of OHS, GERD, breast cancer, hypertension, hyperlipidemia, PVD. Patient presented secondary to fatigue and dyspnea on exertion. Concern on admission for acute heart failure.  evidence of severe hypercarbia on ABG with associated acidemia. Patient with acute, uncompensated respiratory acidosis. Pt was put on bipap.      SLP Plan  DC ST - All goals met       Recommendations  Diet recommendations: Regular;Thin liquid Liquids provided via: Cup;Straw Medication Administration: (per pt preference) Supervision: Patient able to self feed Compensations: Slow rate;Small sips/bites;Follow solids with liquid;Minimize environmental distractions Postural Changes and/or Swallow Maneuvers: Seated upright 90 degrees;Upright 30-60 min after meal                Oral Care Recommendations: Oral care BID Follow up Recommendations: None SLP Visit Diagnosis: Dysphagia, unspecified (R13.10) Plan: All goals met       GO              Jacody Beneke B. Quentin Ore, [facility], [place] Speech Language Pathologist Office: [phone] Pager: [phone]  Shonna Chock 10/26/2019, 11:31 AM

## 2019-10-26 NOTE — Progress Notes (Signed)
Inpatient Rehabilitation Admissions Coordinator  I have received a denial form [employer] for CIR admit after peer to peer with [facility] MD and Dr. Darrick Meigs. I have notified daughter, [name] and patient by phone. They would like for me to proceed with appeal, which I will initiate today. They are also requesting SNF placement at this time . I have encouraged patient to push with therapy progress with goal for discharge home if unable to admit to CIR or SNF. I have updated acute team, Dr. Darrick Meigs, as well as [facility] team, Suanne Marker RN CM and [place], [place]. I will follow up once I have heard final decision form appeal process.   Danne Baxter, RN, MSN Rehab Admissions Coordinator [phone] 10/26/2019 2:43 PM

## 2019-10-26 NOTE — Progress Notes (Signed)
[facility]  PROGRESS NOTE  Daisy [name] DGL:[id number] DOB: Apr 02, 1951 DOA: 10/17/2019 PCP: Alvester Chou, NP   Brief HPI:   69 year old female was admitted on 10/17/2019 with fatigue, exertional dyspnea, hypercarbia.  Was found to be in decompensated diastolic heart failure and cor pulmonale in setting of untreated OSA.  She improved with positive pressure ventilation and IV diuresis.  Urine culture grew E. coli which was pansensitive.  Subjective   Patient seen and examined, blood pressure continues to fluctuate.  She still requiring oxygen 3 L/min   Assessment/Plan:     1. Acute on chronic hypoxic and hypercarbic respiratory failure-secondary to decompensated diastolic CHF, cor pulmonale, pulmonary edema.  Patient also has untreated OSA.  Requiring BiPAP at nighttime and still requiring 3 to 4 L of oxygen via nasal cannula during daytime.  PCCM was consulted.  IV furosemide has been changed to torsemide 10 mg p.o. twice daily. 2. Obstructive sleep apnea-patient requiring BiPAP at bedtime.  She will need a sleep study done with pulmonology as outpatient.  PCCM to arrange that as outpatient.  Patient will need Trelegy ventilator for home.   3. Acute on chronic diastolic CHF-patient's BNP on admission was 769, was started on IV Lasix 40 mg twice a day.  Has been switched to Demadex 10 mg twice daily from 10/23/2019.  Blood pressure still has been fluctuating, required Levophed gtt. 2 days ago.  Will closely monitor patient's blood pressure and adjust Demadex as needed.  [name] was held due to hypotension. 4. CKD stage IIIb-patient's creatinine is 1.49, at baseline.  Started on Demadex as above.  Follow BUN/creatinine in a.m. 5. UTI-had E. coli growing in the urine culture, which was pansensitive.  Patient received ceftriaxone on 10/17/2019 and then Keflex from 10/17/2019 2 10/22/2019.     SpO2: 97 % O2 Flow Rate (L/min): 3 L/min FiO2 (%): 60 %   COVID-19 Labs  No results for  input(s): DDIMER, FERRITIN, LDH, CRP in the last 72 hours.  Lab Results  Component Value Date   [place] NEGATIVE 10/17/2019     CBG: Recent Labs  Lab 10/19/19 2117 10/20/19 2124  GLUCAP 89 116*    CBC: Recent Labs  Lab 10/21/19 0227 10/24/19 0755 10/25/19 0149 10/26/19 0226  WBC 7.8 7.0 7.3 6.6  NEUTROABS 5.4  --  4.9 4.7  HGB 13.8 13.6 13.4 13.3  HCT 46.2* 46.6* 45.2 44.8  MCV 100.9* 102.6* 101.1* 100.4*  PLT 221 190 173 518    Basic Metabolic Panel: Recent Labs  Lab 10/20/19 0218 10/20/19 0218 10/21/19 0227 10/21/19 0227 10/22/19 0204 10/23/19 0350 10/24/19 0259 10/25/19 0149 10/26/19 0226  NA 138   < > 138   < > 137 138 137 136 136  K 2.8*   < > 3.1*   < > 3.1* 3.7 4.1 4.1 4.4  CL 86*   < > 84*   < > 84* 89* 90* 91* 93*  CO2 40*   < > 42*   < > 41* 38* 37* 36* 34*  GLUCOSE 98   < > 111*   < > 122* 117* 113* 111* 128*  BUN 21   < > 22   < > 25* 24* 26* 28* 31*  CREATININE 1.43*   < > 1.42*   < > 1.57* 1.39* 1.35* 1.55* 1.49*  CALCIUM 8.8*   < > 8.9   < > 9.1 9.4 9.0 8.9 8.9  MG 1.9  --  1.7  --   --   --  2.1  --   --   PHOS  --   --  2.1*  --   --   --  3.1  --   --    < > = values in this interval not displayed.     Liver Function Tests: Recent Labs  Lab 10/21/19 0227 10/21/19 0227 10/22/19 0204 10/23/19 0350 10/24/19 0259 10/25/19 0149 10/26/19 0226  AST 14*  --  13* 14*  --  19 18  ALT 19  --  17 15  --  16 17  ALKPHOS 74  --  70 64  --  61 61  BILITOT 1.1  --  0.8 0.6  --  0.6 0.9  PROT 6.5  --  6.5 6.6  --  6.3* 6.3*  ALBUMIN 3.2*   < > 3.3* 3.2* 3.3* 3.1* 3.1*   < > = values in this interval not displayed.        DVT prophylaxis: Lovenox  Code Status: Full code  Family Communication: No family at bedside  Disposition Plan:   Status is: Inpatient  Dispo: The patient is from: Home              Anticipated d/c is to: CIR versus skilled nursing facility              Anticipated d/c date is: 10/27/2019               Patient currently requiring BiPAP at nighttime with oxygen during daytime.  Blood pressure still fluctuating.  She required Levophed infusion 2 days ago due to hypotension.  [name] was held.        Scheduled medications:  . anastrozole  1 mg Oral Daily  . aspirin  81 mg Oral Daily  . capsaicin   Topical BID  . chlorhexidine  15 mL Mouth Rinse BID  . Chlorhexidine Gluconate Cloth  6 each Topical Daily  . cilostazol  100 mg Oral BID  . enoxaparin (LOVENOX) injection  70 mg Subcutaneous Daily  . mouth rinse  15 mL Mouth Rinse q12n4p  . metoprolol succinate  12.5 mg Oral Daily  . multivitamin  1 tablet Oral Daily  . nicotine  7 mg Transdermal Daily  . pantoprazole  40 mg Oral Daily  . potassium chloride  40 mEq Oral BID  . sodium chloride flush  3 mL Intravenous Q12H  . torsemide  10 mg Oral BID    Consultants:  PCCM  Procedures:    Antibiotics:   Anti-infectives (From admission, onward)   Start     Dose/Rate Route Frequency Ordered Stop   10/20/19 2200  cephALEXin (KEFLEX) 250 MG/5ML suspension 500 mg  Status:  Discontinued     500 mg Oral Every 12 hours 10/20/19 2108 10/22/19 0924   10/18/19 0600  cephALEXin (KEFLEX) capsule 500 mg  Status:  Discontinued     500 mg Oral Every 12 hours 10/17/19 1608 10/20/19 2108   10/17/19 1600  cefTRIAXone (ROCEPHIN) 1 g in sodium chloride 0.9 % 100 mL IVPB     1 g 200 mL/hr over 30 Minutes Intravenous  Once 10/17/19 1545 10/17/19 1751       Objective   Vitals:   10/26/19 1018 10/26/19 1100 10/26/19 1114 10/26/19 1200  BP: 121/82 (!) 95/37 (!) 117/47 95/61  Pulse: 92 93 85 87  Resp:  (!) 23 (!) 26 16  Temp:      TempSrc:      SpO2:  95% 93% 97%  Weight:      Height:        Intake/Output Summary (Last 24 hours) at 10/26/2019 1231 Last data filed at 10/26/2019 0845 Gross per 24 hour  Intake 258.75 ml  Output -  Net 258.75 ml    05/10 1901 - 05/12 0700 In: 154.4 [I.V.:154.4] Out: 500 [Urine:500]  Filed Weights    10/24/19 0630 10/25/19 0500 10/26/19 0500  Weight: (!) 145.9 kg (!) 145.9 kg (!) 145.9 kg    Physical Examination:    General: Appears in no acute distress  Cardiovascular: S1-S2, regular  Respiratory: Decreased breath sounds at lung bases  Abdomen: Abdomen is soft, nontender, no organomegaly  Extremities: Trace edema in the lower extremities  Neurologic: Alert, oriented x3, no focal deficit noted    Data Reviewed:   Recent Results (from the past 240 hour(s))  Respiratory Panel by RT PCR (Flu A&B, Covid) - Nasopharyngeal Swab     Status: None   Collection Time: 10/17/19 12:48 PM   Specimen: Nasopharyngeal Swab  Result Value Ref Range Status   SARS Coronavirus 2 by RT PCR NEGATIVE NEGATIVE Final    Comment: (NOTE) SARS-CoV-2 target nucleic acids are NOT DETECTED. The SARS-CoV-2 RNA is generally detectable in upper respiratoy specimens during the acute phase of infection. The lowest concentration of SARS-CoV-2 viral copies this assay can detect is 131 copies/mL. A negative result does not preclude SARS-Cov-2 infection and should not be used as the sole basis for treatment or other patient management decisions. A negative result may occur with  improper specimen collection/handling, submission of specimen other than nasopharyngeal swab, presence of viral mutation(s) within the areas targeted by this assay, and inadequate number of viral copies (<131 copies/mL). A negative result must be combined with clinical observations, patient history, and epidemiological information. The expected result is Negative. Fact Sheet for Patients:  PinkCheek.be Fact Sheet for Healthcare Providers:  GravelBags.it This test is not yet ap proved or cleared by the [place] FDA and  has been authorized for detection and/or diagnosis of SARS-CoV-2 by FDA under an Emergency Use Authorization (EUA). This EUA will remain  in effect  (meaning this test can be used) for the duration of the COVID-19 declaration under Section 564(b)(1) of the Act, 21 U.S.C. section 360bbb-3(b)(1), unless the authorization is terminated or revoked sooner.    Influenza A by PCR NEGATIVE NEGATIVE Final   Influenza B by PCR NEGATIVE NEGATIVE Final    Comment: (NOTE) The Xpert Xpress SARS-CoV-2/FLU/RSV assay is intended as an aid in  the diagnosis of influenza from Nasopharyngeal swab specimens and  should not be used as a sole basis for treatment. Nasal washings and  aspirates are unacceptable for Xpert Xpress SARS-CoV-2/FLU/RSV  testing. Fact Sheet for Patients: PinkCheek.be Fact Sheet for Healthcare Providers: GravelBags.it This test is not yet approved or cleared by the [place] FDA and  has been authorized for detection and/or diagnosis of SARS-CoV-2 by  FDA under an Emergency Use Authorization (EUA). This EUA will remain  in effect (meaning this test can be used) for the duration of the  Covid-19 declaration under Section 564(b)(1) of the Act, 21  U.S.C. section 360bbb-3(b)(1), unless the authorization is  terminated or revoked. Performed at [facility], [place] [address], [place], [place] [address]   Urine culture     Status: Abnormal   Collection Time: 10/17/19  2:29 PM   Specimen: Urine, Clean Catch  Result Value Ref Range Status   Specimen Description  Final    URINE, CLEAN CATCH Performed at [facility], [place] [address] [place], [place] [address]    Special Requests   Final    NONE Performed at [facility], [place] [address] [place], [place] [address]    Culture >=100,000 COLONIES/mL ESCHERICHIA COLI (A)  Final   Report Status 10/19/2019 FINAL  Final   Organism ID, Bacteria ESCHERICHIA COLI (A)  Final      Susceptibility   Escherichia coli - MIC*    AMPICILLIN <=2 SENSITIVE Sensitive     CEFAZOLIN  <=4 SENSITIVE Sensitive     CEFTRIAXONE <=1 SENSITIVE Sensitive     CIPROFLOXACIN <=0.25 SENSITIVE Sensitive     GENTAMICIN <=1 SENSITIVE Sensitive     IMIPENEM <=0.25 SENSITIVE Sensitive     NITROFURANTOIN <=16 SENSITIVE Sensitive     TRIMETH/SULFA <=20 SENSITIVE Sensitive     AMPICILLIN/SULBACTAM <=2 SENSITIVE Sensitive     PIP/TAZO <=4 SENSITIVE Sensitive     * >=100,000 COLONIES/mL ESCHERICHIA COLI  MRSA PCR Screening     Status: None   Collection Time: 10/18/19  5:55 PM   Specimen: Nasal Mucosa; Nasopharyngeal  Result Value Ref Range Status   MRSA by PCR NEGATIVE NEGATIVE Final    Comment:        The GeneXpert MRSA Assay (FDA approved for NASAL specimens only), is one component of a comprehensive MRSA colonization surveillance program. It is not intended to diagnose MRSA infection nor to guide or monitor treatment for MRSA infections. Performed at [facility], [place] [address] [place], [place] [address]     BNP (last 3 results) Recent Labs    10/17/19 1329  BNP 769.5*        [place]   [facility] If 7PM-7AM, please contact night-coverage at [email or web address], Office  [phone]   10/26/2019, 12:31 PM  LOS: 9 days

## 2019-10-27 LAB — COMPREHENSIVE METABOLIC PANEL
ALT: 17 U/L (ref 0–44)
AST: 16 U/L (ref 15–41)
Albumin: 3.2 g/dL — ABNORMAL LOW (ref 3.5–5.0)
Alkaline Phosphatase: 64 U/L (ref 38–126)
Anion gap: 8 (ref 5–15)
BUN: 31 mg/dL — ABNORMAL HIGH (ref 8–23)
CO2: 32 mmol/L (ref 22–32)
Calcium: 9 mg/dL (ref 8.9–10.3)
Chloride: 96 mmol/L — ABNORMAL LOW (ref 98–111)
Creatinine, Ser: 1.33 mg/dL — ABNORMAL HIGH (ref 0.44–1.00)
GFR calc Af Amer: 47 mL/min — ABNORMAL LOW (ref >60)
GFR calc non Af Amer: 41 mL/min — ABNORMAL LOW (ref >60)
Glucose, Bld: 112 mg/dL — ABNORMAL HIGH (ref 70–99)
Potassium: 4.9 mmol/L (ref 3.5–5.1)
Sodium: 136 mmol/L (ref 135–145)
Total Bilirubin: 1 mg/dL (ref 0.3–1.2)
Total Protein: 6.5 g/dL (ref 6.5–8.1)

## 2019-10-27 LAB — CBC
HCT: 45.2 % (ref 36.0–46.0)
Hemoglobin: 13.4 g/dL (ref 12.0–15.0)
MCH: 29.8 pg (ref 26.0–34.0)
MCHC: 29.6 g/dL — ABNORMAL LOW (ref 30.0–36.0)
MCV: 100.7 fL — ABNORMAL HIGH (ref 80.0–100.0)
Platelets: 154 10*3/uL (ref 150–400)
RBC: 4.49 MIL/uL (ref 3.87–5.11)
RDW: 14.6 % (ref 11.5–15.5)
WBC: 7 10*3/uL (ref 4.0–10.5)
nRBC: 0 % (ref 0.0–0.2)

## 2019-10-27 NOTE — Progress Notes (Signed)
Currently off BIPAP; no distress noted.

## 2019-10-27 NOTE — Progress Notes (Signed)
[facility]  PROGRESS NOTE  Daisy [name] DDU:[id number] DOB: 06-25-1950 DOA: 10/17/2019 PCP: Alvester Chou, NP   Brief HPI:   69 year old female was admitted on 10/17/2019 with fatigue, exertional dyspnea, hypercarbia.  Was found to be in decompensated diastolic heart failure and cor pulmonale in setting of untreated OSA.  She improved with positive pressure ventilation and IV diuresis.  Urine culture grew E. coli which was pansensitive.  Subjective   Patient seen and examined, denies any complaints.  Requiring BiPAP at bedtime, continue with 3 L/min of oxygen via nasal cannula during daytime.  [employer] declined patient to go to CIR.   Assessment/Plan:     1. Acute on chronic hypoxic and hypercarbic respiratory failure-secondary to decompensated diastolic CHF, cor pulmonale, pulmonary edema.  Patient also has untreated OSA.  Requiring BiPAP at nighttime and still requiring 3 to 4 L of oxygen via nasal cannula during daytime.  PCCM was consulted.  IV furosemide has been changed to torsemide 10 mg p.o. twice daily. 2. Obstructive sleep apnea-patient requiring BiPAP at bedtime.  She will need a sleep study done with pulmonology as outpatient.  PCCM to arrange that as outpatient.  Patient will need Trelegy ventilator for home.   3. Acute on chronic diastolic CHF-patient's BNP on admission was 769, was started on IV Lasix 40 mg twice a day.  Has been switched to Demadex 10 mg twice daily from 10/23/2019.  Blood pressure still has been fluctuating, required Levophed gtt. 2 days ago.  Will closely monitor patient's blood pressure and adjust Demadex as needed.  [name] was held due to hypotension. 4. CKD stage IIIb-patient's creatinine is 1.33, at baseline.  Started on Demadex as above.  Follow BUN/creatinine in a.m. 5. UTI-had E. coli growing in the urine culture, which was pansensitive.  Patient received ceftriaxone on 10/17/2019 and then Keflex from 10/17/2019 2 10/22/2019.     SpO2: 96  % O2 Flow Rate (L/min): 3 L/min FiO2 (%): 60 %   COVID-19 Labs  No results for input(s): DDIMER, FERRITIN, LDH, CRP in the last 72 hours.  Lab Results  Component Value Date   [place] NEGATIVE 10/17/2019     CBG: Recent Labs  Lab 10/20/19 2124  GLUCAP 116*    CBC: Recent Labs  Lab 10/21/19 0227 10/24/19 0755 10/25/19 0149 10/26/19 0226 10/27/19 0218  WBC 7.8 7.0 7.3 6.6 7.0  NEUTROABS 5.4  --  4.9 4.7  --   HGB 13.8 13.6 13.4 13.3 13.4  HCT 46.2* 46.6* 45.2 44.8 45.2  MCV 100.9* 102.6* 101.1* 100.4* 100.7*  PLT 221 190 173 164 237    Basic Metabolic Panel: Recent Labs  Lab 10/21/19 0227 10/22/19 0204 10/23/19 0350 10/24/19 0259 10/25/19 0149 10/26/19 0226 10/27/19 0218  NA 138   < > 138 137 136 136 136  K 3.1*   < > 3.7 4.1 4.1 4.4 4.9  CL 84*   < > 89* 90* 91* 93* 96*  CO2 42*   < > 38* 37* 36* 34* 32  GLUCOSE 111*   < > 117* 113* 111* 128* 112*  BUN 22   < > 24* 26* 28* 31* 31*  CREATININE 1.42*   < > 1.39* 1.35* 1.55* 1.49* 1.33*  CALCIUM 8.9   < > 9.4 9.0 8.9 8.9 9.0  MG 1.7  --   --  2.1  --   --   --   PHOS 2.1*  --   --  3.1  --   --   --    < > =  values in this interval not displayed.     Liver Function Tests: Recent Labs  Lab 10/22/19 0204 10/22/19 0204 10/23/19 0350 10/24/19 0259 10/25/19 0149 10/26/19 0226 10/27/19 0218  AST 13*  --  14*  --  19 18 16   ALT 17  --  15  --  16 17 17   ALKPHOS 70  --  64  --  61 61 64  BILITOT 0.8  --  0.6  --  0.6 0.9 1.0  PROT 6.5  --  6.6  --  6.3* 6.3* 6.5  ALBUMIN 3.3*   < > 3.2* 3.3* 3.1* 3.1* 3.2*   < > = values in this interval not displayed.        DVT prophylaxis: Lovenox  Code Status: Full code  Family Communication: No family at bedside  Disposition Plan:   Status is: Inpatient  Dispo: The patient is from: Home              Anticipated d/c is DX:[facility] nursing facility              Anticipated d/c date is: 10/27/2019              Patient currently requiring BiPAP at  nighttime with oxygen during daytime.  Blood pressure still fluctuating.  She required Levophed infusion 2 days ago due to hypotension.  [name] was held.        Scheduled medications:  . anastrozole  1 mg Oral Daily  . aspirin  81 mg Oral Daily  . capsaicin   Topical BID  . chlorhexidine  15 mL Mouth Rinse BID  . Chlorhexidine Gluconate Cloth  6 each Topical Daily  . cilostazol  100 mg Oral BID  . enoxaparin (LOVENOX) injection  70 mg Subcutaneous Daily  . mouth rinse  15 mL Mouth Rinse q12n4p  . metoprolol succinate  12.5 mg Oral Daily  . multivitamin  1 tablet Oral Daily  . nicotine  7 mg Transdermal Daily  . pantoprazole  40 mg Oral Daily  . potassium chloride  40 mEq Oral BID  . sodium chloride flush  3 mL Intravenous Q12H  . torsemide  10 mg Oral BID    Consultants:  PCCM  Procedures:    Antibiotics:   Anti-infectives (From admission, onward)   Start     Dose/Rate Route Frequency Ordered Stop   10/20/19 2200  cephALEXin (KEFLEX) 250 MG/5ML suspension 500 mg  Status:  Discontinued     500 mg Oral Every 12 hours 10/20/19 2108 10/22/19 0924   10/18/19 0600  cephALEXin (KEFLEX) capsule 500 mg  Status:  Discontinued     500 mg Oral Every 12 hours 10/17/19 1608 10/20/19 2108   10/17/19 1600  cefTRIAXone (ROCEPHIN) 1 g in sodium chloride 0.9 % 100 mL IVPB     1 g 200 mL/hr over 30 Minutes Intravenous  Once 10/17/19 1545 10/17/19 1751       Objective   Vitals:   10/27/19 0830 10/27/19 0900 10/27/19 1200 10/27/19 1235  BP:  (!) 134/47  (!) 123/50  Pulse: 95 92  87  Resp: (!) 27 (!) 27  (!) 27  Temp:   97.9 F (36.6 C)   TempSrc:      SpO2: (!) 88% 93%  96%  Weight:      Height:       No intake or output data in the 24 hours ending 10/27/19 1338  05/11 1901 - 05/13 0700 In: 720 [P.O.:720] Out: -  Filed Weights   10/25/19 0500 10/26/19 0500 10/27/19 0500  Weight: (!) 145.9 kg (!) 145.9 kg (!) 145.9 kg    Physical  Examination:    General-appears in no acute distress  Heart-S1-S2, regular, no murmur auscultated  Lungs-clear to auscultation bilaterally, no wheezing or crackles auscultated  Abdomen-soft, nontender, no organomegaly  Extremities-no edema in the lower extremities  Neuro-alert, oriented x3, no focal deficit noted    Data Reviewed:   Recent Results (from the past 240 hour(s))  Urine culture     Status: Abnormal   Collection Time: 10/17/19  2:29 PM   Specimen: Urine, Clean Catch  Result Value Ref Range Status   Specimen Description   Final    URINE, CLEAN CATCH Performed at [place] [address] [place], [place] [address]    Special Requests   Final    NONE Performed at [facility], [place] [address], [place], [place] [address]    Culture >=100,000 COLONIES/mL ESCHERICHIA COLI (A)  Final   Report Status 10/19/2019 FINAL  Final   Organism ID, Bacteria ESCHERICHIA COLI (A)  Final      Susceptibility   Escherichia coli - MIC*    AMPICILLIN <=2 SENSITIVE Sensitive     CEFAZOLIN <=4 SENSITIVE Sensitive     CEFTRIAXONE <=1 SENSITIVE Sensitive     CIPROFLOXACIN <=0.25 SENSITIVE Sensitive     GENTAMICIN <=1 SENSITIVE Sensitive     IMIPENEM <=0.25 SENSITIVE Sensitive     NITROFURANTOIN <=16 SENSITIVE Sensitive     TRIMETH/SULFA <=20 SENSITIVE Sensitive     AMPICILLIN/SULBACTAM <=2 SENSITIVE Sensitive     PIP/TAZO <=4 SENSITIVE Sensitive     * >=100,000 COLONIES/mL ESCHERICHIA COLI  MRSA PCR Screening     Status: None   Collection Time: 10/18/19  5:55 PM   Specimen: Nasal Mucosa; Nasopharyngeal  Result Value Ref Range Status   MRSA by PCR NEGATIVE NEGATIVE Final    Comment:        The GeneXpert MRSA Assay (FDA approved for NASAL specimens only), is one component of a comprehensive MRSA colonization surveillance program. It is not intended to diagnose MRSA infection nor to guide or monitor treatment for MRSA  infections. Performed at [facility], [place] [address] [place], [place] [address]     BNP (last 3 results) Recent Labs    10/17/19 1329  BNP 769.5*        [place]   [facility] If 7PM-7AM, please contact night-coverage at [email or web address], Office  [phone]   10/27/2019, 1:38 PM  LOS: 10 days

## 2019-10-27 NOTE — TOC Progression Note (Addendum)
Transition of Care [facility]) - Progression Note    Patient Details  Name: Tallie Dodds MRN: [id number] Date of Birth: [birth year]  Transition of [facility]) CM/SW Contact  Leeroy Cha, RN Phone Number: 10/27/2019, 9:07 AM  Clinical Narrative:    CIR under appeal with [facility]ven[facility]den, will start snf auth in case appeal denied. List of accepted snf given to patient Accepted    [place] Preferred SNF Details       [facility] Details       [facility] Details       [facility] Details       [facility] Details       [facility] Details  Daughter and patient would to use [facility], [facility] or [facility]. Expected Discharge Plan: IP Rehab Facility Barriers to Discharge: Insurance Authorization  Expected Discharge Plan and Services Expected Discharge Plan: [place]   Discharge Planning Services: CM Consult   Living arrangements for the past 2 months: Single Family Home                                       Social Determinants of Health (SDOH) Interventions    Readmission Risk Interventions No flowsheet data found.

## 2019-10-27 NOTE — Progress Notes (Signed)
Physical Therapy Treatment Patient Details Name: Daisy [name] MRN: [id number] DOB: 1950-10-05 Today's Date: 10/27/2019    History of Present Illness 69 yo female with onset of acute SOB and fatigue with alteration of mobility , hand and foot cramps.  Noted significant LE edema,PMHx:  OA, cholecystitis, depression, dyspnea, HLD, L breast CA, PVD, PNA, rotator cuff repair.requires BiPAP for hypercabic respiratory failure.    PT Comments    Patient making steady progress. Ambulated on RA with SPO2 dropping to 85%, Then ambulated x 45' and 65' on 3 L using 4 wheeled RW. Cues for safe use of RW, locking/unlocking breaks, secure RW prior to sitting down  To ensure RW does not move. Sat for rest breaks x 2.. Continue progressive ambulation with PT.   Follow Up Recommendations  SNF     Equipment Recommendations  (rollator-wide)    Recommendations for Other Services       Precautions / Restrictions Precautions Precautions: Fall Precaution Comments: monitor sats/HR, requires O2 Restrictions Weight Bearing Restrictions: No    Mobility  Bed Mobility               General bed mobility comments: in recliner, sitting upright  Transfers   Equipment used: 4-wheeled walker Transfers: Sit to/from Stand Sit to Stand: Min assist         General transfer comment: cues for safe sue of rollator, cues to lock and unlock break, safety when turning to sit down to rollator/recliner  Ambulation/Gait Ambulation/Gait assistance: Min assist Gait Distance (Feet): 20 Feet(then 45, then 65') Assistive device: 4-wheeled walker Gait Pattern/deviations: Step-through pattern;Trunk flexed Gait velocity: decr   General Gait Details: slow steady gait. Amb x 20' on RA with SPO2 dropping to 85%. placed on 3 L [place] for 45' then 65'- SPO2 91%   Stairs             Wheelchair Mobility    Modified Rankin (Stroke Patients Only)       Balance     Sitting balance-Leahy Scale: Good      Standing balance support: Single extremity supported Standing balance-Leahy Scale: Fair                              Cognition Arousal/Alertness: Awake/alert Behavior During Therapy: WFL for tasks assessed/performed Overall Cognitive Status: Within Functional Limits for tasks assessed                                        Exercises      General Comments        Pertinent Vitals/Pain Pain Assessment: No/denies pain    Home Living                      Prior Function            PT Goals (current goals can now be found in the care plan section) Progress towards PT goals: Progressing toward goals    Frequency    Min 2X/week      PT Plan Discharge plan needs to be updated;Frequency needs to be updated(Ins decline CIR)    Co-evaluation              AM-PAC PT "6 Clicks" Mobility   Outcome Measure  Help needed turning from your back to your side while in a flat bed without  using bedrails?: A Little Help needed moving from lying on your back to sitting on the side of a flat bed without using bedrails?: A Little Help needed moving to and from a bed to a chair (including a wheelchair)?: A Little Help needed standing up from a chair using your arms (e.g., wheelchair or bedside chair)?: A Little Help needed to walk in hospital room?: A Little Help needed climbing 3-5 steps with a railing? : A Lot 6 Click Score: 17    End of Session Equipment Utilized During Treatment: Oxygen Activity Tolerance: Patient tolerated treatment well Patient left: in chair;with call bell/phone within reach Nurse Communication: Mobility status PT Visit Diagnosis: Muscle weakness (generalized) (M62.81);Other abnormalities of gait and mobility (R26.89);Difficulty in walking, not elsewhere classified (R26.2)     Time: 5208-0223 PT Time Calculation (min) (ACUTE ONLY): 50 min  Charges:  $Gait Training: 38-52 mins                     Tresa Endo  PT Acute Rehabilitation Services Pager [phone] Office [phone]    Claretha Cooper 10/27/2019, 10:15 AM

## 2019-10-27 NOTE — NC FL2 (Signed)
[place] LEVEL OF CARE SCREENING TOOL     IDENTIFICATION  Patient Name: Shenicka Sunderlin Birthdate: 01-24-1951 Sex: female Admission Date (Current Location): 10/17/2019  [facility] and [place] Number:  Herbalist and Address:  [facility],  [place] [address], [place]      Provider Number: [id number]  Attending Physician Name and Address:  Oswald Hillock, MD  Relative Name and Phone Number:       Current Level of Care: Hospital Recommended Level of Care: [place] Prior Approval Number:    Date Approved/Denied:   PASRR Number: [id number]  Discharge Plan:      Current Diagnoses: Patient Active Problem List   Diagnosis Date Noted  . Acute on chronic respiratory failure with hypoxia and hypercapnia ([place]) 10/22/2019  . Slurred speech   . Acute heart failure ([place]) 10/17/2019  . History of breast cancer 10/17/2019  . AKI (acute kidney injury) ([place]) 10/17/2019  . Essential hypertension 10/17/2019  . Urinary tract infection with hematuria   . Breast cancer of upper-outer quadrant of right female breast ([place]) 11/19/2016  . Malignant neoplasm of upper-outer quadrant of left breast in female, estrogen receptor positive ([place]) 09/30/2016  . Claudication in peripheral vascular disease ([place]) 04/04/2014  . Obesity hypoventilation syndrome ([place]) 04/04/2014  . GERD (gastroesophageal reflux disease) 04/04/2014    Orientation RESPIRATION BLADDER Height & Weight     Self, Time, Situation, Place  Normal Continent Weight: (!) 145.9 kg Height:  5\' 6"  (167.6 cm)  BEHAVIORAL SYMPTOMS/MOOD NEUROLOGICAL BOWEL NUTRITION STATUS      Continent Diet(regular)  AMBULATORY STATUS COMMUNICATION OF NEEDS Skin   Extensive Assist Verbally Normal                       Personal Care Assistance Level of Assistance  Bathing, Feeding, Dressing Bathing Assistance: Limited assistance Feeding assistance: Limited assistance Dressing  Assistance: Limited assistance     Functional Limitations Info  Sight, Speech, Hearing Sight Info: Adequate Hearing Info: Adequate Speech Info: Adequate    SPECIAL CARE FACTORS FREQUENCY  PT (By licensed PT), OT (By licensed OT)     PT Frequency: 5xweekly OT Frequency: 5 x weekly            Contractures Contractures Info: Not present    Additional Factors Info  Code Status Code Status Info: full             Current Medications (10/27/2019):  This is the current hospital active medication list Current Facility-Administered Medications  Medication Dose Route Frequency Provider Last Rate Last Admin  . 0.9 %  sodium chloride infusion  250 mL Intravenous PRN Emeterio Reeve, DO      . 0.9 %  sodium chloride infusion  250 mL Intravenous Continuous Bunnie Pion Z, DO      . 0.9 %  sodium chloride infusion  250 mL Intravenous Continuous Bunnie Pion Z, DO   Stopped at 10/25/19 1352  . acetaminophen (TYLENOL) tablet 650 mg  650 mg Oral Q4H PRN Emeterio Reeve, DO      . albuterol (VENTOLIN HFA) 108 (90 Base) MCG/ACT inhaler 2 puff  2 puff Inhalation Q6H PRN Emeterio Reeve, DO      . anastrozole (ARIMIDEX) tablet 1 mg  1 mg Oral Daily Emeterio Reeve, DO   1 mg at 10/27/19 6237  . aspirin chewable tablet 81 mg  81 mg Oral Daily Mariel Aloe, MD   81 mg  at 10/27/19 0832  . capsaicin (ZOSTRIX) 0.025 % cream   Topical BID Nita Sells, MD   Given at 10/26/19 2120  . chlorhexidine (PERIDEX) 0.12 % solution 15 mL  15 mL Mouth Rinse BID Mariel Aloe, MD   15 mL at 10/27/19 0835  . Chlorhexidine Gluconate Cloth 2 % PADS 6 each  6 each Topical Daily Mariel Aloe, MD   6 each at 10/26/19 1021  . cilostazol (PLETAL) tablet 100 mg  100 mg Oral BID Emeterio Reeve, DO   100 mg at 10/27/19 6979  . diclofenac sodium (VOLTAREN) 1 % transdermal gel 4 g  4 g Topical QID PRN Emeterio Reeve, DO      . enoxaparin (LOVENOX) injection 70 mg  70 mg Subcutaneous  Daily Nita Sells, MD   70 mg at 10/27/19 0837  . MEDLINE mouth rinse  15 mL Mouth Rinse q12n4p Mariel Aloe, MD   15 mL at 10/25/19 1657  . metoprolol succinate (TOPROL-XL) 24 hr tablet 12.5 mg  12.5 mg Oral Daily Emeterio Reeve, DO   12.5 mg at 10/27/19 4801  . multivitamin (PROSIGHT) tablet 1 tablet  1 tablet Oral Daily Emeterio Reeve, DO   1 tablet at 10/27/19 6553  . naphazoline-glycerin (CLEAR EYES REDNESS) ophth solution 1-2 drop  1-2 drop Both Eyes QID PRN Emeterio Reeve, DO      . naproxen (NAPROSYN) tablet 250 mg  250 mg Oral BID PRN Emeterio Reeve, DO      . nicotine (NICODERM CQ - dosed in mg/24 hr) patch 7 mg  7 mg Transdermal Daily Mariel Aloe, MD   7 mg at 10/20/19 1151  . ondansetron (ZOFRAN) injection 4 mg  4 mg Intravenous Q6H PRN Emeterio Reeve, DO      . pantoprazole (PROTONIX) EC tablet 40 mg  40 mg Oral Daily Nita Sells, MD   40 mg at 10/27/19 0833  . polyethylene glycol (MIRALAX / GLYCOLAX) packet 17 g  17 g Oral Daily PRN Noemi Chapel P, DO   17 g at 10/24/19 1620  . potassium chloride SA (KLOR-CON) CR tablet 40 mEq  40 mEq Oral BID Nita Sells, MD   40 mEq at 10/27/19 0832  . sodium chloride flush (NS) 0.9 % injection 3 mL  3 mL Intravenous Q12H Emeterio Reeve, DO   3 mL at 10/27/19 0835  . sodium chloride flush (NS) 0.9 % injection 3 mL  3 mL Intravenous PRN Emeterio Reeve, DO      . torsemide [facility]) tablet 10 mg  10 mg Oral BID Nita Sells, MD   10 mg at 10/27/19 7482     Discharge Medications: Please see discharge summary for a list of discharge medications.  Relevant Imaging Results:  Relevant Lab Results:   Additional Information [id number]  Leeroy Cha, RN

## 2019-10-27 NOTE — Progress Notes (Signed)
Occupational Therapy Treatment Patient Details Name: Daisy [name] MRN: [id number] DOB: December 22, 1950 Today's Date: 10/27/2019    History of present illness 69 yo female with onset of acute SOB and fatigue with alteration of mobility , hand and foot cramps.  Noted significant LE edema,PMHx:  OA, cholecystitis, depression, dyspnea, HLD, L breast CA, PVD, PNA, rotator cuff repair.requires BiPAP for hypercabic respiratory failure.   OT comments  Patient progressing well with therapy. Initiated education on LB adaptive equipment, pt return demo with min cues for technique use of reacher and sock aid to doff/don socks seated EOB. Also review use of toileting aid as patient states trying to reach for peri care after bowel movement has been more difficult. Will continue to follow.    Follow Up Recommendations  CIR;Other (comment);SNF(SNF if not approved CIR)    Equipment Recommendations  Other (comment)(TBD)       Precautions / Restrictions Precautions Precautions: Fall Precaution Comments: monitor sats/HR, requires O2       Mobility Bed Mobility Overal bed mobility: Needs Assistance         Sit to supine: Supervision   General bed mobility comments: increased time to lift LEs into bed  Transfers Overall transfer level: Needs assistance Equipment used: None Transfers: Sit to/from Stand Sit to Stand: Min guard         General transfer comment: min guard for safety     Balance Overall balance assessment: Needs assistance Sitting-balance support: Feet supported;No upper extremity supported Sitting balance-Leahy Scale: Good     Standing balance support: Single extremity supported Standing balance-Leahy Scale: Fair Standing balance comment: holding onto bed rail to support when transferring back to bed                           ADL either performed or assessed with clinical judgement   ADL Overall ADL's : Needs assistance/impaired                      Lower Body Dressing: Supervision/safety;Sitting/lateral leans;With adaptive equipment Lower Body Dressing Details (indicate cue type and reason): pt unable to complete doff/don socks as she typically does at home. educate pt on use of AE and have pt return demo with min cues for technique  Toilet Transfer: Min Geneticist, molecular Details (indicate cue type and reason): simulated with recliner to bed, increased time to mobilize                           Cognition Arousal/Alertness: Awake/alert Behavior During Therapy: WFL for tasks assessed/performed Overall Cognitive Status: Within Functional Limits for tasks assessed                                                     Pertinent Vitals/ Pain       Pain Assessment: No/denies pain         Frequency  Min 2X/week        Progress Toward Goals  OT Goals(current goals can now be found in the care plan section)  Progress towards OT goals: Progressing toward goals  Acute Rehab OT Goals Patient Stated Goal: to be independent and go home and take care of myself OT Goal Formulation: With patient Time For Goal Achievement: 11/05/19 Potential  to Achieve Goals: Good ADL Goals Pt Will Perform Lower Body Bathing: sit to/from stand;with modified independence;with adaptive equipment Pt Will Perform Lower Body Dressing: with modified independence;sit to/from stand;with adaptive equipment Pt Will Transfer to Toilet: with supervision;ambulating;bedside commode Pt Will Perform Toileting - Clothing Manipulation and hygiene: with supervision;sit to/from stand Pt Will Perform Tub/Shower Transfer: with min guard assist;shower seat;grab bars;ambulating;rolling walker Additional ADL Goal #1: Patient will stand at sink to perform grooming task. Additional ADL Goal #2: Patient will tolerate 15 min exercise activity to improve strength and endurance.  Plan Discharge plan remains appropriate        AM-PAC OT "6 Clicks" Daily Activity     Outcome Measure   Help from another person eating meals?: None Help from another person taking care of personal grooming?: A Little Help from another person toileting, which includes using toliet, bedpan, or urinal?: A Lot Help from another person bathing (including washing, rinsing, drying)?: A Lot Help from another person to put on and taking off regular upper body clothing?: A Little Help from another person to put on and taking off regular lower body clothing?: A Little 6 Click Score: 17    End of Session Equipment Utilized During Treatment: Other (comment)(AE)  OT Visit Diagnosis: Muscle weakness (generalized) (M62.81);History of falling (Z91.81)   Activity Tolerance Patient tolerated treatment well   Patient Left in bed;with call bell/phone within reach           Time: 1336-1400 OT Time Calculation (min): 24 min  Charges: OT General Charges $OT Visit: 1 Visit OT Treatments $Self Care/Home Management : 23-37 mins  Delbert Phenix OT Pager: [place] 10/27/2019, 3:29 PM

## 2019-10-28 MED ORDER — LIP MEDEX EX OINT
TOPICAL_OINTMENT | CUTANEOUS | Status: DC | PRN
  Filled 2019-10-28: qty 7

## 2019-10-28 NOTE — Progress Notes (Addendum)
Inpatient Rehabilitation Admissions Coordinator  I have received a denial from [facility] on expedited appeal for CIR admit. I contacted patient and daughter, [name] , by phone.They have been made aware as well as Dr. Darrick Meigs and [facility] team, Suanne Marker. We will sign off at this time.  Danne Baxter, RN, MSN Rehab Admissions Coordinator [phone] 10/28/2019 2:12 PM

## 2019-10-28 NOTE — Progress Notes (Signed)
[facility]  PROGRESS NOTE  Daisy [name] CLE:[id number] DOB: 12/19/1950 DOA: 10/17/2019 PCP: Alvester Chou, NP   Brief HPI:   69 year old female was admitted on 10/17/2019 with fatigue, exertional dyspnea, hypercarbia.  Was found to be in decompensated diastolic heart failure and cor pulmonale in setting of untreated OSA.  She improved with positive pressure ventilation and IV diuresis.  Urine culture grew E. coli which was pansensitive.  Subjective   Patient seen and examined, denies any complaints.  No chest pain or shortness of breath.  Awaiting bed at skilled nursing facility.   Assessment/Plan:     1. Acute on chronic hypoxic and hypercarbic respiratory failure-secondary to decompensated diastolic CHF, cor pulmonale, pulmonary edema.  Patient also has untreated OSA.  Requiring BiPAP at nighttime and still requiring 3 to 4 L of oxygen via nasal cannula during daytime.  PCCM was consulted.  IV furosemide has been changed to torsemide 10 mg p.o. twice daily. 2. Obstructive sleep apnea-patient requiring BiPAP at bedtime.  She will need a sleep study done with pulmonology as outpatient.  PCCM to arrange that as outpatient.  Patient will need Trelegy ventilator for home.   3. Acute on chronic diastolic CHF-patient's BNP on admission was 769, was started on IV Lasix 40 mg twice a day.  Has been switched to Demadex 10 mg twice daily from 10/23/2019.  Blood pressure still has been fluctuating, required Levophed gtt. 2 days ago.  Will closely monitor patient's blood pressure and adjust Demadex as needed.  [name] was held due to hypotension. 4. CKD stage IIIb-patient's creatinine is 1.33, at baseline.  Started on Demadex as above.  Follow BUN/creatinine in a.m. 5. UTI-had E. coli growing in the urine culture, which was pansensitive.  Patient received ceftriaxone on 10/17/2019 and then Keflex from 10/17/2019 2 10/22/2019.     SpO2: 99 % O2 Flow Rate (L/min): 2 L/min FiO2 (%): 60 %   COVID-19  Labs  No results for input(s): DDIMER, FERRITIN, LDH, CRP in the last 72 hours.  Lab Results  Component Value Date   [place] NEGATIVE 10/17/2019     CBG: No results for input(s): GLUCAP in the last 168 hours.  CBC: Recent Labs  Lab 10/24/19 0755 10/25/19 0149 10/26/19 0226 10/27/19 0218  WBC 7.0 7.3 6.6 7.0  NEUTROABS  --  4.9 4.7  --   HGB 13.6 13.4 13.3 13.4  HCT 46.6* 45.2 44.8 45.2  MCV 102.6* 101.1* 100.4* 100.7*  PLT 190 173 164 944    Basic Metabolic Panel: Recent Labs  Lab 10/23/19 0350 10/24/19 0259 10/25/19 0149 10/26/19 0226 10/27/19 0218  NA 138 137 136 136 136  K 3.7 4.1 4.1 4.4 4.9  CL 89* 90* 91* 93* 96*  CO2 38* 37* 36* 34* 32  GLUCOSE 117* 113* 111* 128* 112*  BUN 24* 26* 28* 31* 31*  CREATININE 1.39* 1.35* 1.55* 1.49* 1.33*  CALCIUM 9.4 9.0 8.9 8.9 9.0  MG  --  2.1  --   --   --   PHOS  --  3.1  --   --   --      Liver Function Tests: Recent Labs  Lab 10/22/19 0204 10/22/19 0204 10/23/19 0350 10/24/19 0259 10/25/19 0149 10/26/19 0226 10/27/19 0218  AST 13*  --  14*  --  19 18 16   ALT 17  --  15  --  16 17 17   ALKPHOS 70  --  64  --  61 61 64  BILITOT  0.8  --  0.6  --  0.6 0.9 1.0  PROT 6.5  --  6.6  --  6.3* 6.3* 6.5  ALBUMIN 3.3*   < > 3.2* 3.3* 3.1* 3.1* 3.2*   < > = values in this interval not displayed.        DVT prophylaxis: Lovenox  Code Status: Full code  Family Communication: No family at bedside  Disposition Plan:   Status is: Inpatient  Dispo: The patient is from: Home              Anticipated d/c is [facility] nursing facility              Anticipated d/c date is: 10/31/2019              Patient currently requiring BiPAP at nighttime with oxygen during daytime.  Blood pressure still fluctuating.  She required Levophed infusion 2 days ago due to hypotension.  [name] was held.        Scheduled medications:  . anastrozole  1 mg Oral Daily  . aspirin  81 mg Oral Daily  . capsaicin   Topical  BID  . chlorhexidine  15 mL Mouth Rinse BID  . Chlorhexidine Gluconate Cloth  6 each Topical Daily  . cilostazol  100 mg Oral BID  . enoxaparin (LOVENOX) injection  70 mg Subcutaneous Daily  . mouth rinse  15 mL Mouth Rinse q12n4p  . metoprolol succinate  12.5 mg Oral Daily  . multivitamin  1 tablet Oral Daily  . nicotine  7 mg Transdermal Daily  . pantoprazole  40 mg Oral Daily  . potassium chloride  40 mEq Oral BID  . sodium chloride flush  3 mL Intravenous Q12H  . torsemide  10 mg Oral BID    Consultants:  PCCM  Procedures:    Antibiotics:   Anti-infectives (From admission, onward)   Start     Dose/Rate Route Frequency Ordered Stop   10/20/19 2200  cephALEXin (KEFLEX) 250 MG/5ML suspension 500 mg  Status:  Discontinued     500 mg Oral Every 12 hours 10/20/19 2108 10/22/19 0924   10/18/19 0600  cephALEXin (KEFLEX) capsule 500 mg  Status:  Discontinued     500 mg Oral Every 12 hours 10/17/19 1608 10/20/19 2108   10/17/19 1600  cefTRIAXone (ROCEPHIN) 1 g in sodium chloride 0.9 % 100 mL IVPB     1 g 200 mL/hr over 30 Minutes Intravenous  Once 10/17/19 1545 10/17/19 1751       Objective   Vitals:   10/28/19 0700 10/28/19 0800 10/28/19 0900 10/28/19 1000  BP: (!) 124/59 138/66 (!) 139/51 (!) 135/58  Pulse: 93 91 91 87  Resp: (!) 23 (!) 30 (!) 22 18  Temp:  98.3 F (36.8 C)    TempSrc:  Axillary    SpO2: (!) 86% 91% 99% 99%  Weight:      Height:        Intake/Output Summary (Last 24 hours) at 10/28/2019 1016 Last data filed at 10/28/2019 0800 Gross per 24 hour  Intake 720 ml  Output 500 ml  Net 220 ml    05/12 1901 - 05/14 0700 In: 480 [P.O.:480] Out: 500 [Urine:500]  Filed Weights   10/26/19 0500 10/27/19 0500 10/28/19 0500  Weight: (!) 145.9 kg (!) 145.9 kg (!) 146 kg    Physical Examination:   General-appears in no acute distress  Heart-S1-S2, regular, no murmur auscultated  Lungs-clear to auscultation bilaterally, no wheezing or  crackles  auscultated  Abdomen-soft, nontender, no organomegaly  Extremities-no edema in the lower extremities  Neuro-alert, oriented x3, no focal deficit noted    Data Reviewed:   Recent Results (from the past 240 hour(s))  MRSA PCR Screening     Status: None   Collection Time: 10/18/19  5:55 PM   Specimen: Nasal Mucosa; Nasopharyngeal  Result Value Ref Range Status   MRSA by PCR NEGATIVE NEGATIVE Final    Comment:        The GeneXpert MRSA Assay (FDA approved for NASAL specimens only), is one component of a comprehensive MRSA colonization surveillance program. It is not intended to diagnose MRSA infection nor to guide or monitor treatment for MRSA infections. Performed at [facility], [place] [address] [place], [place] [address]     BNP (last 3 results) Recent Labs    10/17/19 1329  BNP 769.5*        [place]   [facility] If 7PM-7AM, please contact night-coverage at [email or web address], Office  [phone]   10/28/2019, 10:16 AM  LOS: 11 days

## 2019-10-29 LAB — BASIC METABOLIC PANEL
Anion gap: 9 (ref 5–15)
BUN: 21 mg/dL (ref 8–23)
CO2: 28 mmol/L (ref 22–32)
Calcium: 9 mg/dL (ref 8.9–10.3)
Chloride: 98 mmol/L (ref 98–111)
Creatinine, Ser: 1.27 mg/dL — ABNORMAL HIGH (ref 0.44–1.00)
GFR calc Af Amer: 50 mL/min — ABNORMAL LOW (ref >60)
GFR calc non Af Amer: 43 mL/min — ABNORMAL LOW (ref >60)
Glucose, Bld: 99 mg/dL (ref 70–99)
Potassium: 5.1 mmol/L (ref 3.5–5.1)
Sodium: 135 mmol/L (ref 135–145)

## 2019-10-29 NOTE — Progress Notes (Signed)
[facility]  PROGRESS NOTE  Daisy [name] SHF:[id number] DOB: 1950-08-16 DOA: 10/17/2019 PCP: Alvester Chou, NP   Brief HPI:   69 year old female was admitted on 10/17/2019 with fatigue, exertional dyspnea, hypercarbia.  Was found to be in decompensated diastolic heart failure and cor pulmonale in setting of untreated OSA.  She improved with positive pressure ventilation and IV diuresis.  Urine culture grew E. coli which was pansensitive.  Subjective   Patient seen and examined, feeling better today.  Oxygen requirement has gone down.  Today requiring 1 L/min.   Assessment/Plan:     1. Acute on chronic hypoxic and hypercarbic respiratory failure-improving, secondary to decompensated diastolic CHF, cor pulmonale, pulmonary edema.  Patient also has untreated OSA.  Requiring BiPAP at nighttime and now requiring 1 L/min of oxygen during daytime.    PCCM was consulted and has signed off.  IV furosemide has been changed to torsemide 10 mg p.o. twice daily. 2. Obstructive sleep apnea-patient requiring BiPAP at bedtime.  She will need a sleep study done with pulmonology as outpatient.  PCCM to arrange that as outpatient.  Patient will need Trelegy ventilator for home.   3. Acute on chronic diastolic CHF-patient's BNP on admission was 769, was started on IV Lasix 40 mg twice a day.  Has been switched to Demadex 10 mg twice daily from 10/23/2019.  Blood pressure still has been fluctuating, required Levophed gtt. 2 days ago.  Will closely monitor patient's blood pressure and adjust Demadex as needed.  [name] was held due to hypotension. 4. CKD stage IIIb-patient's creatinine is 1.27, at baseline.  Started on Demadex as above.  Follow BUN/creatinine in a.m. Will discontinue potassium supplementation.  Today potassium is 5.1. 5. UTI-had E. coli growing in the urine culture, which was pansensitive.  Patient received ceftriaxone on 10/17/2019 and then Keflex from 10/17/2019 2 10/22/2019.     SpO2: 97  % O2 Flow Rate (L/min): 1 L/min FiO2 (%): 60 %   COVID-19 Labs  No results for input(s): DDIMER, FERRITIN, LDH, CRP in the last 72 hours.  Lab Results  Component Value Date   [place] NEGATIVE 10/17/2019     CBG: No results for input(s): GLUCAP in the last 168 hours.  CBC: Recent Labs  Lab 10/24/19 0755 10/25/19 0149 10/26/19 0226 10/27/19 0218  WBC 7.0 7.3 6.6 7.0  NEUTROABS  --  4.9 4.7  --   HGB 13.6 13.4 13.3 13.4  HCT 46.6* 45.2 44.8 45.2  MCV 102.6* 101.1* 100.4* 100.7*  PLT 190 173 164 502    Basic Metabolic Panel: Recent Labs  Lab 10/24/19 0259 10/25/19 0149 10/26/19 0226 10/27/19 0218 10/29/19 0254  NA 137 136 136 136 135  K 4.1 4.1 4.4 4.9 5.1  CL 90* 91* 93* 96* 98  CO2 37* 36* 34* 32 28  GLUCOSE 113* 111* 128* 112* 99  BUN 26* 28* 31* 31* 21  CREATININE 1.35* 1.55* 1.49* 1.33* 1.27*  CALCIUM 9.0 8.9 8.9 9.0 9.0  MG 2.1  --   --   --   --   PHOS 3.1  --   --   --   --      Liver Function Tests: Recent Labs  Lab 10/23/19 0350 10/24/19 0259 10/25/19 0149 10/26/19 0226 10/27/19 0218  AST 14*  --  19 18 16   ALT 15  --  16 17 17   ALKPHOS 64  --  61 61 64  BILITOT 0.6  --  0.6 0.9 1.0  PROT  6.6  --  6.3* 6.3* 6.5  ALBUMIN 3.2* 3.3* 3.1* 3.1* 3.2*        DVT prophylaxis: Lovenox  Code Status: Full code  Family Communication: No family at bedside  Disposition Plan:   Status is: Inpatient  Dispo: The patient is from: Home              Anticipated d/c is [id number] nursing facility              Anticipated d/c date is: 10/31/2019              Patient currently requiring BiPAP at nighttime with oxygen during daytime.  Blood pressure still fluctuating.  She required Levophed infusion 2 days ago due to hypotension.  [name] was held.        Scheduled medications:  . anastrozole  1 mg Oral Daily  . aspirin  81 mg Oral Daily  . capsaicin   Topical BID  . chlorhexidine  15 mL Mouth Rinse BID  . Chlorhexidine Gluconate  Cloth  6 each Topical Daily  . cilostazol  100 mg Oral BID  . enoxaparin (LOVENOX) injection  70 mg Subcutaneous Daily  . mouth rinse  15 mL Mouth Rinse q12n4p  . metoprolol succinate  12.5 mg Oral Daily  . multivitamin  1 tablet Oral Daily  . nicotine  7 mg Transdermal Daily  . pantoprazole  40 mg Oral Daily  . sodium chloride flush  3 mL Intravenous Q12H  . torsemide  10 mg Oral BID    Consultants:  PCCM  Procedures:    Antibiotics:   Anti-infectives (From admission, onward)   Start     Dose/Rate Route Frequency Ordered Stop   10/20/19 2200  cephALEXin (KEFLEX) 250 MG/5ML suspension 500 mg  Status:  Discontinued     500 mg Oral Every 12 hours 10/20/19 2108 10/22/19 0924   10/18/19 0600  cephALEXin (KEFLEX) capsule 500 mg  Status:  Discontinued     500 mg Oral Every 12 hours 10/17/19 1608 10/20/19 2108   10/17/19 1600  cefTRIAXone (ROCEPHIN) 1 g in sodium chloride 0.9 % 100 mL IVPB     1 g 200 mL/hr over 30 Minutes Intravenous  Once 10/17/19 1545 10/17/19 1751       Objective   Vitals:   10/29/19 0615 10/29/19 0700 10/29/19 0800 10/29/19 0805  BP:   (!) 172/84 125/60  Pulse: 81 82 94 94  Resp: (!) 22 (!) 26 (!) 23 (!) 23  Temp:   98 F (36.7 C)   TempSrc:   Oral   SpO2: 91% 93% 93% 97%  Weight:      Height:        Intake/Output Summary (Last 24 hours) at 10/29/2019 1018 Last data filed at 10/29/2019 0800 Gross per 24 hour  Intake 480 ml  Output 950 ml  Net -470 ml    05/13 1901 - 05/15 0700 In: 960 [P.O.:960] Out: 950 [Urine:950]  Filed Weights   10/27/19 0500 10/28/19 0500 10/29/19 0458  Weight: (!) 145.9 kg (!) 146 kg (!) 147.4 kg    Physical Examination:  General-appears in no acute distress Heart-S1-S2, regular, no murmur auscultated Lungs-clear to auscultation bilaterally, no wheezing or crackles auscultated Abdomen-soft, nontender, no organomegaly Extremities-1+ pitting edema of the lower extremities Neuro-alert, oriented x3, no focal  deficit noted   Data Reviewed:   No results found for this or any previous visit (from the past 240 hour(s)).  BNP (last 3 results) Recent  Labs    10/17/19 1329  BNP 769.5*        [place]   [facility] If 7PM-7AM, please contact night-coverage at [email or web address], Office  [phone]   10/29/2019, 10:18 AM  LOS: 12 days

## 2019-10-29 NOTE — Progress Notes (Signed)
Physical Therapy Treatment Patient Details Name: Daisy [name] MRN: [id number] DOB: [birth year] Today's Date: 10/29/2019    History of Present Illness 69 yo female with onset of acute SOB and fatigue with alteration of mobility , hand and foot cramps.  Noted significant LE edema,PMHx:  OA, cholecystitis, depression, dyspnea, HLD, L breast CA, PVD, PNA, rotator cuff repair.requires BiPAP for hypercabic respiratory failure.    PT Comments    Pt demonstrating good progress today.  O2 sats were stable on 2 LPM O2 for ambulation 60'x3 with sitting rest breaks.  Pt required min guard to min A for transfers for safety and steadying.  Cont per POC.     Follow Up Recommendations  SNF     Equipment Recommendations  Other (comment)(rollator (wide))    Recommendations for Other Services       Precautions / Restrictions Precautions Precautions: Fall Precaution Comments: monitor sats/HR, requires O2    Mobility  Bed Mobility Overal bed mobility: Needs Assistance Bed Mobility: Supine to Sit     Supine to sit: Min assist;HOB elevated Sit to supine: Min guard   General bed mobility comments: increased time to lift LEs into bed  Transfers Overall transfer level: Needs assistance Equipment used: 4-wheeled walker Transfers: Sit to/from Stand Sit to Stand: Min guard         General transfer comment: sit to stand x 2 from bed and x 3 from rollator; did not require cues to lock RW  Ambulation/Gait Ambulation/Gait assistance: Min guard Gait Distance (Feet): 60 Feet(x3) Assistive device: 4-wheeled walker Gait Pattern/deviations: Step-through pattern;Trunk flexed     General Gait Details: cues for posture; required 2 sitting rest breaks   Stairs             Wheelchair Mobility    Modified Rankin (Stroke Patients Only)       Balance Overall balance assessment: Needs assistance Sitting-balance support: Feet supported;No upper extremity supported Sitting  balance-Leahy Scale: Good     Standing balance support: Single extremity supported Standing balance-Leahy Scale: Fair Standing balance comment: able to straighten sheets with 1 UE suppiort                            Cognition Arousal/Alertness: Awake/alert Behavior During Therapy: WFL for tasks assessed/performed Overall Cognitive Status: Within Functional Limits for tasks assessed                                        Exercises      General Comments General comments (skin integrity, edema, etc.): Pt on Bipap for sleep at arrival.  Placed on 1 L [place] rest and sats 95%.  Ambulated on 2 L O2 and sats 92% with rest breaks.  Back on 1 L O2 at rest with sats 95% and pt reports not going to sleep.  HR up to 110 bpm with walking.      Pertinent Vitals/Pain Pain Assessment: No/denies pain    Home Living                      Prior Function            PT Goals (current goals can now be found in the care plan section) Acute Rehab PT Goals Patient Stated Goal: to be independent and go home and take care of myself PT Goal Formulation: With  patient/family Time For Goal Achievement: 11/01/19 Potential to Achieve Goals: Good Progress towards PT goals: Progressing toward goals    Frequency    Min 2X/week      PT Plan Current plan remains appropriate    Co-evaluation              AM-PAC PT "6 Clicks" Mobility   Outcome Measure  Help needed turning from your back to your side while in a flat bed without using bedrails?: None Help needed moving from lying on your back to sitting on the side of a flat bed without using bedrails?: A Little Help needed moving to and from a bed to a chair (including a wheelchair)?: A Little Help needed standing up from a chair using your arms (e.g., wheelchair or bedside chair)?: None Help needed to walk in hospital room?: A Little Help needed climbing 3-5 steps with a railing? : A Lot 6 Click Score: 19     End of Session Equipment Utilized During Treatment: Oxygen Activity Tolerance: Patient tolerated treatment well Patient left: in bed;with call bell/phone within reach;with bed alarm set Nurse Communication: Mobility status PT Visit Diagnosis: Muscle weakness (generalized) (M62.81);Other abnormalities of gait and mobility (R26.89);Difficulty in walking, not elsewhere classified (R26.2)     Time: 8177-1165 PT Time Calculation (min) (ACUTE ONLY): 29 min  Charges:  $Gait Training: 8-22 mins $Therapeutic Activity: 8-22 mins                     Maggie Font, PT Acute Rehab Services Pager [phone] [facility] [phone]    Karlton Lemon 10/29/2019, 3:57 PM

## 2019-10-30 LAB — BASIC METABOLIC PANEL
Anion gap: 8 (ref 5–15)
BUN: 21 mg/dL (ref 8–23)
CO2: 33 mmol/L — ABNORMAL HIGH (ref 22–32)
Calcium: 9.1 mg/dL (ref 8.9–10.3)
Chloride: 99 mmol/L (ref 98–111)
Creatinine, Ser: 1.33 mg/dL — ABNORMAL HIGH (ref 0.44–1.00)
GFR calc Af Amer: 47 mL/min — ABNORMAL LOW (ref >60)
GFR calc non Af Amer: 41 mL/min — ABNORMAL LOW (ref >60)
Glucose, Bld: 112 mg/dL — ABNORMAL HIGH (ref 70–99)
Potassium: 4 mmol/L (ref 3.5–5.1)
Sodium: 140 mmol/L (ref 135–145)

## 2019-10-30 NOTE — Progress Notes (Signed)
[facility]  PROGRESS NOTE  Daisy [name] DXI:[id number] DOB: 06/03/1951 DOA: 10/17/2019 PCP: Daisy Chou, NP   Brief HPI:   69 year old female was admitted on 10/17/2019 with fatigue, exertional dyspnea, hypercarbia.  Was found to be in decompensated diastolic heart failure and cor pulmonale in setting of untreated OSA.  She improved with positive pressure ventilation and IV diuresis.  Urine culture grew E. coli which was pansensitive.  Subjective   Patient seen and examined, denies shortness of breath.  Waiting for bed at skilled nursing facility.   Assessment/Plan:     1. Acute on chronic hypoxic and hypercarbic respiratory failure-improving, secondary to decompensated diastolic CHF, cor pulmonale, pulmonary edema.  Patient also has untreated OSA.  Requiring BiPAP at nighttime and now requiring 1 L/min of oxygen during daytime.    PCCM was consulted and has signed off.  IV furosemide has been changed to torsemide 10 mg p.o. twice daily.  Continue torsemide at current dose. 2. Obstructive sleep apnea-patient requiring BiPAP at bedtime.  She will need a sleep study done with pulmonology as outpatient.  PCCM to arrange that as outpatient.  Patient will need Trelegy ventilator for home.   3. Acute on chronic diastolic CHF-patient's BNP on admission was 769, was started on IV Lasix 40 mg twice a day.  Has been switched to Demadex 10 mg twice daily from 10/23/2019.  Patient developed hypotension after starting Entresto, required Levophed gtt. blood pressure is stable at this time.  Will closely monitor patient's blood pressure and adjust Demadex as needed.  [name] was held due to hypotension. 4. CKD stage IIIb-patient's creatinine is 1.33 at baseline.  Started on Demadex as above.  Follow BUN/creatinine in a.m. potassium supplementation was discontinued, yesterday potassium was 5.1.  Today potassium is 4.0 5. UTI-had E. coli growing in the urine culture, which was pansensitive.  Patient  received ceftriaxone on 10/17/2019 and then Keflex from 10/17/2019 2 10/22/2019.     SpO2: 95 % O2 Flow Rate (L/min): 1 L/min FiO2 (%): 60 %   COVID-19 Labs  No results for input(s): DDIMER, FERRITIN, LDH, CRP in the last 72 hours.  Lab Results  Component Value Date   [place] NEGATIVE 10/17/2019     CBG: No results for input(s): GLUCAP in the last 168 hours.  CBC: Recent Labs  Lab 10/24/19 0755 10/25/19 0149 10/26/19 0226 10/27/19 0218  WBC 7.0 7.3 6.6 7.0  NEUTROABS  --  4.9 4.7  --   HGB 13.6 13.4 13.3 13.4  HCT 46.6* 45.2 44.8 45.2  MCV 102.6* 101.1* 100.4* 100.7*  PLT 190 173 164 767    Basic Metabolic Panel: Recent Labs  Lab 10/24/19 0259 10/24/19 0259 10/25/19 0149 10/26/19 0226 10/27/19 0218 10/29/19 0254 10/30/19 0324  NA 137   < > 136 136 136 135 140  K 4.1   < > 4.1 4.4 4.9 5.1 4.0  CL 90*   < > 91* 93* 96* 98 99  CO2 37*   < > 36* 34* 32 28 33*  GLUCOSE 113*   < > 111* 128* 112* 99 112*  BUN 26*   < > 28* 31* 31* 21 21  CREATININE 1.35*   < > 1.55* 1.49* 1.33* 1.27* 1.33*  CALCIUM 9.0   < > 8.9 8.9 9.0 9.0 9.1  MG 2.1  --   --   --   --   --   --   PHOS 3.1  --   --   --   --   --   --    < > =  values in this interval not displayed.     Liver Function Tests: Recent Labs  Lab 10/24/19 0259 10/25/19 0149 10/26/19 0226 10/27/19 0218  AST  --  19 18 16   ALT  --  16 17 17   ALKPHOS  --  61 61 64  BILITOT  --  0.6 0.9 1.0  PROT  --  6.3* 6.3* 6.5  ALBUMIN 3.3* 3.1* 3.1* 3.2*        DVT prophylaxis: Lovenox  Code Status: Full code  Family Communication: No family at bedside  Disposition Plan:   Status is: Inpatient  Dispo: The patient is from: Home              Anticipated d/c is [facility] nursing facility              Anticipated d/c date is: 10/31/2019              Patient currently requiring BiPAP at nighttime with oxygen during daytime.  Blood pressure still fluctuating.  She required Levophed infusion 2 days ago due to  hypotension.  [name] was held.        Scheduled medications:  . anastrozole  1 mg Oral Daily  . aspirin  81 mg Oral Daily  . capsaicin   Topical BID  . chlorhexidine  15 mL Mouth Rinse BID  . Chlorhexidine Gluconate Cloth  6 each Topical Daily  . cilostazol  100 mg Oral BID  . enoxaparin (LOVENOX) injection  70 mg Subcutaneous Daily  . mouth rinse  15 mL Mouth Rinse q12n4p  . metoprolol succinate  12.5 mg Oral Daily  . multivitamin  1 tablet Oral Daily  . nicotine  7 mg Transdermal Daily  . pantoprazole  40 mg Oral Daily  . sodium chloride flush  3 mL Intravenous Q12H  . torsemide  10 mg Oral BID    Consultants:  PCCM  Procedures:    Antibiotics:   Anti-infectives (From admission, onward)   Start     Dose/Rate Route Frequency Ordered Stop   10/20/19 2200  cephALEXin (KEFLEX) 250 MG/5ML suspension 500 mg  Status:  Discontinued     500 mg Oral Every 12 hours 10/20/19 2108 10/22/19 0924   10/18/19 0600  cephALEXin (KEFLEX) capsule 500 mg  Status:  Discontinued     500 mg Oral Every 12 hours 10/17/19 1608 10/20/19 2108   10/17/19 1600  cefTRIAXone (ROCEPHIN) 1 g in sodium chloride 0.9 % 100 mL IVPB     1 g 200 mL/hr over 30 Minutes Intravenous  Once 10/17/19 1545 10/17/19 1751       Objective   Vitals:   10/30/19 0400 10/30/19 0500 10/30/19 0600 10/30/19 0800  BP: (!) 112/40 (!) 131/49 (!) 130/105 (!) 130/55  Pulse: 79 83 98 88  Resp: 19 (!) 21 20 (!) 24  Temp: 98.8 F (37.1 C)   98.1 F (36.7 C)  TempSrc: Axillary   Oral  SpO2: 96% 96% 99% 95%  Weight:  (!) 146.7 kg    Height:        Intake/Output Summary (Last 24 hours) at 10/30/2019 1128 Last data filed at 10/29/2019 1800 Gross per 24 hour  Intake 720 ml  Output 950 ml  Net -230 ml    05/14 1901 - 05/16 0700 In: 1080 [P.O.:1080] Out: 1900 [Urine:1900]  Filed Weights   10/28/19 0500 10/29/19 0458 10/30/19 0500  Weight: (!) 146 kg (!) 147.4 kg (!) 146.7 kg    Physical  Examination:  General-appears in no acute distress Heart-S1-S2, regular, no murmur auscultated Lungs-clear to auscultation bilaterally, no wheezing or crackles auscultated Abdomen-soft, nontender, no organomegaly Extremities-no edema in the lower extremities Neuro-alert, oriented x3, no focal deficit noted   Data Reviewed:   No results found for this or any previous visit (from the past 240 hour(s)).  BNP (last 3 results) Recent Labs    10/17/19 1329  BNP 769.5*        [place]   [facility] If 7PM-7AM, please contact night-coverage at [email or web address], Office  [phone]   10/30/2019, 11:28 AM  LOS: 13 days

## 2019-10-31 NOTE — TOC Progression Note (Signed)
Transition of Care [facility]) - Progression Note    Patient Details  Name: Daisy [name] MRN: [id number] Date of Birth: 1951-02-08  Transition of [facility] CM/SW Contact  Leeroy Cha, RN Phone Number: 10/31/2019, 10:04 AM  Clinical Narrative:    Additional information faxed to [facility] for consideration of snf stay.   Expected Discharge Plan: Skilled Nursing Facility Barriers to Discharge: Insurance Authorization  Expected Discharge Plan and Services Expected Discharge Plan: [place]   Discharge Planning Services: CM Consult   Living arrangements for the past 2 months: Single Family Home                                       Social Determinants of Health (SDOH) Interventions    Readmission Risk Interventions No flowsheet data found.

## 2019-10-31 NOTE — Progress Notes (Signed)
[facility]  PROGRESS NOTE  Hiedi [place] OIN:[id number] DOB: [birth year] DOA: 10/17/2019 PCP: Alvester Chou, NP   Brief HPI:   69 year old female was admitted on 10/17/2019 with fatigue, exertional dyspnea, hypercarbia.  Was found to be in decompensated diastolic heart failure and cor pulmonale in setting of untreated OSA.  She improved with positive pressure ventilation and IV diuresis.  Urine culture grew E. coli which was pansensitive.  Subjective   Patient seen, denies shortness of breath.  Still requiring 2 L/min of oxygen.  BiPAP at bedtime.   Assessment/Plan:     1. Acute on chronic hypoxic and hypercarbic respiratory failure-improving, secondary to decompensated diastolic CHF, cor pulmonale, pulmonary edema.  Patient also has untreated OSA.  Requiring BiPAP at nighttime and now requiring 1 to 2  L/min of oxygen during daytime.    PCCM was consulted and has signed off.  IV furosemide has been changed to torsemide 10 mg p.o. twice daily.  Continue torsemide at current dose. 2. Obstructive sleep apnea-patient requiring BiPAP at bedtime.  She will need a sleep study done with pulmonology as outpatient.  PCCM to arrange that as outpatient.  Patient will need Trelegy ventilator for home.   3. Acute on chronic diastolic CHF-patient's BNP on admission was 769, was started on IV Lasix 40 mg twice a day.  Has been switched to Demadex 10 mg twice daily from 10/23/2019.  Patient developed hypotension after starting Entresto, required Levophed gtt. Blood pressure is stable at this time.  Will closely monitor patient's blood pressure and adjust Demadex as needed.  [name] was held due to hypotension. 4. CKD stage IIIb-patient's creatinine is 1.33 at baseline.  Started on Demadex as above.   Potassium supplementation was discontinued due to mild hyperkalemia. 5. UTI-had E. coli growing in the urine culture, which was pansensitive.  Patient received ceftriaxone on 10/17/2019 and then Keflex from  10/17/2019 to  10/22/2019.     SpO2: 94 % O2 Flow Rate (L/min): 1 L/min FiO2 (%): 60 %      Lab Results  Component Value Date   SARSCOV2NAA NEGATIVE 10/17/2019     CBG: No results for input(s): GLUCAP in the last 168 hours.  CBC: Recent Labs  Lab 10/25/19 0149 10/26/19 0226 10/27/19 0218  WBC 7.3 6.6 7.0  NEUTROABS 4.9 4.7  --   HGB 13.4 13.3 13.4  HCT 45.2 44.8 45.2  MCV 101.1* 100.4* 100.7*  PLT 173 164 709    Basic Metabolic Panel: Recent Labs  Lab 10/25/19 0149 10/26/19 0226 10/27/19 0218 10/29/19 0254 10/30/19 0324  NA 136 136 136 135 140  K 4.1 4.4 4.9 5.1 4.0  CL 91* 93* 96* 98 99  CO2 36* 34* 32 28 33*  GLUCOSE 111* 128* 112* 99 112*  BUN 28* 31* 31* 21 21  CREATININE 1.55* 1.49* 1.33* 1.27* 1.33*  CALCIUM 8.9 8.9 9.0 9.0 9.1     Liver Function Tests: Recent Labs  Lab 10/25/19 0149 10/26/19 0226 10/27/19 0218  AST 19 18 16   ALT 16 17 17   ALKPHOS 61 61 64  BILITOT 0.6 0.9 1.0  PROT 6.3* 6.3* 6.5  ALBUMIN 3.1* 3.1* 3.2*        DVT prophylaxis: Lovenox  Code Status: Full code  Family Communication: No family at bedside  Disposition Plan:   Status is: Inpatient  Dispo: The patient is from: Home              Anticipated d/c is [id number] nursing facility  Anticipated d/c date is: 10/31/2019              Patient currently requiring BiPAP at nighttime with oxygen during daytime.  Barrier to discharge-awaiting placement at skilled nursing facility for rehab        Scheduled medications:  . anastrozole  1 mg Oral Daily  . aspirin  81 mg Oral Daily  . capsaicin   Topical BID  . chlorhexidine  15 mL Mouth Rinse BID  . Chlorhexidine Gluconate Cloth  6 each Topical Daily  . cilostazol  100 mg Oral BID  . enoxaparin (LOVENOX) injection  70 mg Subcutaneous Daily  . mouth rinse  15 mL Mouth Rinse q12n4p  . metoprolol succinate  12.5 mg Oral Daily  . multivitamin  1 tablet Oral Daily  . nicotine  7 mg Transdermal  Daily  . pantoprazole  40 mg Oral Daily  . sodium chloride flush  3 mL Intravenous Q12H  . torsemide  10 mg Oral BID    Consultants:  PCCM  Procedures:    Antibiotics:   Anti-infectives (From admission, onward)   Start     Dose/Rate Route Frequency Ordered Stop   10/20/19 2200  cephALEXin (KEFLEX) 250 MG/5ML suspension 500 mg  Status:  Discontinued     500 mg Oral Every 12 hours 10/20/19 2108 10/22/19 0924   10/18/19 0600  cephALEXin (KEFLEX) capsule 500 mg  Status:  Discontinued     500 mg Oral Every 12 hours 10/17/19 1608 10/20/19 2108   10/17/19 1600  cefTRIAXone (ROCEPHIN) 1 g in sodium chloride 0.9 % 100 mL IVPB     1 g 200 mL/hr over 30 Minutes Intravenous  Once 10/17/19 1545 10/17/19 1751       Objective   Vitals:   10/31/19 0400 10/31/19 0500 10/31/19 0600 10/31/19 0800  BP: (!) 113/45 (!) 131/57 (!) 115/42   Pulse: 71 84 72   Resp: 18 19 18    Temp:    98.3 F (36.8 C)  TempSrc:    Oral  SpO2: 95% 99% 94%   Weight:  (!) 149.1 kg    Height:       No intake or output data in the 24 hours ending 10/31/19 0908  05/15 1901 - 05/17 0700 In: -  Out: 300 [Urine:300]  Filed Weights   10/29/19 0458 10/30/19 0500 10/31/19 0500  Weight: (!) 147.4 kg (!) 146.7 kg (!) 149.1 kg    Physical Examination:    General: Sitting in bed comfortably  Cardiovascular: S1-S2, regular, no murmur auscultated  Respiratory: Decreased breath sounds at lung bases  Abdomen: Soft, nontender  Extremities: Trace edema in the lower extremities  Neurologic: Alert, oriented x3, moving all extremities, no focal deficit noted     BNP (last 3 results) Recent Labs    10/17/19 1329  BNP 769.5*    [place]   [facility] If 7PM-7AM, please contact night-coverage at [email or web address], Office  [phone]   10/31/2019, 9:08 AM  LOS: 14 days

## 2019-10-31 NOTE — Progress Notes (Signed)
Physical Therapy Treatment Patient Details Name: Daisy [name] MRN: [id number] DOB: [birth year] Today's Date: 10/31/2019    History of Present Illness 69 yo female with onset of acute SOB and fatigue with alteration of mobility , hand and foot cramps.  Noted significant LE edema,PMHx:  OA, cholecystitis, depression, dyspnea, HLD, L breast CA, PVD, PNA, rotator cuff repair.requires BiPAP for hypercabic respiratory failure.    PT Comments    The patient continues to improve in distance for ambulation using 4 wheeled rollator. Patient  Ambulated on 2 L [place] with SPO2 92%, HR 126, RR 30.BP pre -ambulation 114/62. Patient awaiting a rehab bed prior to DC home.  Follow Up Recommendations  SNF     Equipment Recommendations  (bari rollator)    Recommendations for Other Services       Precautions / Restrictions Precautions Precautions: Fall Precaution Comments: monitor sats/HR, requires O2    Mobility  Bed Mobility               General bed mobility comments: in recliner  Transfers Overall transfer level: Needs assistance Equipment used: 4-wheeled walker Transfers: Sit to/from Stand Sit to Stand: Min guard;Min assist(verbal/physical cues to ensure RW did not roll during transition)         General transfer comment: si to stand from recliner x 1 and rollator x 2, cues for safety use of Rollator  Ambulation/Gait Ambulation/Gait assistance: Min guard Gait Distance (Feet): 40 Feet(then 60 then 100') Assistive device: 4-wheeled walker Gait Pattern/deviations: Step-through pattern;Trunk flexed Gait velocity: decr   General Gait Details: cues for posture; required 2 sitting rest breaks   Stairs             Wheelchair Mobility    Modified Rankin (Stroke Patients Only)       Balance     Sitting balance-Leahy Scale: Good     Standing balance support: Single extremity supported Standing balance-Leahy Scale: Fair                               Cognition Arousal/Alertness: Awake/alert Behavior During Therapy: WFL for tasks assessed/performed                                          Exercises      General Comments        Pertinent Vitals/Pain Pain Score: 0-No pain    Home Living                      Prior Function            PT Goals (current goals can now be found in the care plan section) Acute Rehab PT Goals Patient Stated Goal: to be independent and go home and take care of myself PT Goal Formulation: With patient Time For Goal Achievement: 11/14/19 Potential to Achieve Goals: Good Progress towards PT goals: Progressing toward goals;Goals met and updated - see care plan    Frequency    Min 2X/week      PT Plan Current plan remains appropriate    Co-evaluation              AM-PAC PT "6 Clicks" Mobility   Outcome Measure  Help needed turning from your back to your side while in a flat bed without using bedrails?: None Help needed moving  from lying on your back to sitting on the side of a flat bed without using bedrails?: A Little Help needed moving to and from a bed to a chair (including a wheelchair)?: A Little Help needed standing up from a chair using your arms (e.g., wheelchair or bedside chair)?: A Little Help needed to walk in hospital room?: A Little Help needed climbing 3-5 steps with a railing? : A Lot 6 Click Score: 18    End of Session Equipment Utilized During Treatment: Oxygen Activity Tolerance: Patient tolerated treatment well Patient left: in chair;with call bell/phone within reach Nurse Communication: Mobility status PT Visit Diagnosis: Muscle weakness (generalized) (M62.81);Other abnormalities of gait and mobility (R26.89);Difficulty in walking, not elsewhere classified (R26.2)     Time: 0601-5615 PT Time Calculation (min) (ACUTE ONLY): 42 min  Charges:  $Gait Training: 38-52 mins                     Daisy [name] PT Acute  Rehabilitation Services Pager [phone] Office [phone]    Daisy [name] 10/31/2019, 2:08 PM

## 2019-11-01 LAB — BASIC METABOLIC PANEL
Anion gap: 11 (ref 5–15)
BUN: 21 mg/dL (ref 8–23)
CO2: 33 mmol/L — ABNORMAL HIGH (ref 22–32)
Calcium: 9.4 mg/dL (ref 8.9–10.3)
Chloride: 98 mmol/L (ref 98–111)
Creatinine, Ser: 1.32 mg/dL — ABNORMAL HIGH (ref 0.44–1.00)
GFR calc Af Amer: 48 mL/min — ABNORMAL LOW (ref >60)
GFR calc non Af Amer: 41 mL/min — ABNORMAL LOW (ref >60)
Glucose, Bld: 113 mg/dL — ABNORMAL HIGH (ref 70–99)
Potassium: 3.4 mmol/L — ABNORMAL LOW (ref 3.5–5.1)
Sodium: 142 mmol/L (ref 135–145)

## 2019-11-01 MED ORDER — POTASSIUM CHLORIDE CRYS ER 20 MEQ PO TBCR
20.0000 meq | EXTENDED_RELEASE_TABLET | Freq: Every day | ORAL | Status: DC
  Administered 2019-11-01 – 2019-11-02 (×2): 20 meq via ORAL
  Filled 2019-11-01 (×2): qty 1

## 2019-11-01 NOTE — Progress Notes (Signed)
Occupational Therapy Treatment Patient Details Name: Daisy [name] MRN: [id number] DOB: 17-Apr-1951 Today's Date: 11/01/2019    History of present illness 69 yo female with onset of acute SOB and fatigue with alteration of mobility , hand and foot cramps.  Noted significant LE edema,PMHx:  OA, cholecystitis, depression, dyspnea, HLD, L breast CA, PVD, PNA, rotator cuff repair.requires BiPAP for hypercabic respiratory failure.   OT comments  Patient progressing towards acute goals, requires seated rest break after standing sink side for g/h for ~2 mins. Min guard for functional mobility and transfers for safety, managing O2 tubing. Will continue to follow.   Follow Up Recommendations  SNF    Equipment Recommendations  Other (comment)(defer to next venue)       Precautions / Restrictions Precautions Precautions: Fall Precaution Comments: monitor sats/HR, requires O2 Restrictions Weight Bearing Restrictions: No       Mobility Bed Mobility               General bed mobility comments: in recliner  Transfers Overall transfer level: Needs assistance Equipment used: 4-wheeled walker Transfers: Sit to/from Stand Sit to Stand: Min guard         General transfer comment: min guard for safety, patient able to recall lock/unlock brakes during functional transfers    Balance Overall balance assessment: Needs assistance Sitting-balance support: Feet supported;No upper extremity supported Sitting balance-Leahy Scale: Good     Standing balance support: Bilateral upper extremity supported;During functional activity Standing balance-Leahy Scale: Fair Standing balance comment: can maintain standing balance with 1 UE support, increased safety with B UE                           ADL either performed or assessed with clinical judgement   ADL Overall ADL's : Needs assistance/impaired     Grooming: Min guard;Wash/dry face;Wash/dry hands;Oral  care;Standing Grooming Details (indicate cue type and reason): require seated rest break after standing sink side for approx 2 mins                 Toilet Transfer: Min Geneticist, molecular Details (indicate cue type and reason): min guard for safety to turn and sit onto bedside commode  Toileting- Clothing Manipulation and Hygiene: Set up;Sitting/lateral lean       Functional mobility during ADLs: Surveyor, quantity)                 Cognition Arousal/Alertness: Awake/alert Behavior During Therapy: WFL for tasks assessed/performed Overall Cognitive Status: Within Functional Limits for tasks assessed                                                General Comments desat to 80s% on 1L however poor wave form and patient asymptomatic    Pertinent Vitals/ Pain       Pain Assessment: No/denies pain         Frequency  Min 2X/week        Progress Toward Goals  OT Goals(current goals can now be found in the care plan section)  Progress towards OT goals: Progressing toward goals  Acute Rehab OT Goals Patient Stated Goal: to be independent and go home and take care of myself OT Goal Formulation: With patient Time For Goal Achievement: 11/15/19 Potential to Achieve Goals: Good ADL Goals Pt Will Perform Lower Body  Bathing: sit to/from stand;with modified independence;with adaptive equipment Pt Will Perform Lower Body Dressing: with modified independence;sit to/from stand;with adaptive equipment Pt Will Transfer to Toilet: with supervision;ambulating;bedside commode Pt Will Perform Toileting - Clothing Manipulation and hygiene: with supervision;sit to/from stand Pt Will Perform Tub/Shower Transfer: with min guard assist;shower seat;grab bars;ambulating;rolling walker Additional ADL Goal #1: Patient will stand at sink to perform grooming task at mod I. Additional ADL Goal #2: Patient will tolerate 15 min exercise activity to improve  strength and endurance.  Plan Discharge plan remains appropriate       AM-PAC OT "6 Clicks" Daily Activity     Outcome Measure   Help from another person eating meals?: None Help from another person taking care of personal grooming?: A Little Help from another person toileting, which includes using toliet, bedpan, or urinal?: A Little Help from another person bathing (including washing, rinsing, drying)?: A Lot Help from another person to put on and taking off regular upper body clothing?: A Little Help from another person to put on and taking off regular lower body clothing?: A Little 6 Click Score: 18    End of Session Equipment Utilized During Treatment: Oxygen(rollator)  OT Visit Diagnosis: Muscle weakness (generalized) (M62.81);History of falling (Z91.81)   Activity Tolerance Patient tolerated treatment well   Patient Left in chair;with call bell/phone within reach   Nurse Communication          Time: 2641-5830 OT Time Calculation (min): 23 min  Charges: OT General Charges $OT Visit: 1 Visit OT Treatments $Self Care/Home Management : 23-37 mins  Delbert Phenix OT Pager: [place] 11/01/2019, 12:13 PM

## 2019-11-01 NOTE — TOC Progression Note (Signed)
Transition of Care [facility]) - Progression Note    Patient Details  Name: Daisy [name] MRN: [id number] Date of Birth: Jul 08, 1950  Transition of Care [facility]) CM/SW Contact  Leeroy Cha, RN Phone Number: 11/01/2019, 8:35 AM  Clinical Narrative:    TCT [employer]/[facility]/md is requesting a p2p before 1230pm today/Note sent to Dr. Darrick Meigs, with information. Number to call [phone] opt 5 dr. Maurine Simmering.   Expected Discharge Plan: Skilled Nursing Facility Barriers to Discharge: Insurance Authorization  Expected Discharge Plan and Services Expected Discharge Plan: [place]   Discharge Planning Services: CM Consult   Living arrangements for the past 2 months: Single Family Home                                       Social Determinants of Health (SDOH) Interventions    Readmission Risk Interventions No flowsheet data found.

## 2019-11-01 NOTE — Progress Notes (Signed)
Pt. placed on BiPAP/V-60 for H/S, tolerating well, RN made aware.

## 2019-11-01 NOTE — Progress Notes (Signed)
Nutrition Brief Note  Patient identified for LOS day #15.  Wt Readings from Last 15 Encounters:  11/01/19 (!) 148.7 kg  08/31/19 (!) 146.7 kg  09/21/17 (!) 148.1 kg  08/03/17 (!) 145.1 kg  03/26/17 (!) 140.4 kg  02/27/17 (!) 144.5 kg  12/25/16 (!) 144.1 kg  11/19/16 (!) 142.8 kg  11/17/16 (!) 142.7 kg  11/05/16 (!) 142.5 kg  10/01/16 (!) 146.1 kg  09/19/16 (!) 142.9 kg  03/13/16 (!) 137 kg  04/18/14 136.1 kg  04/04/14 136.1 kg    Body mass index is 52.91 kg/m. Patient meets criteria for morbid obesity based on current BMI. Weight has been stable over the past 2 months.   She was admitted with acute on chronic respiratory failure with hx of OSA. Plan for Trilogy for at home use. She was also noted to have acute on chronic CHF on admission.   Current diet order is Heart Healthy and patient has been consuming 100% of all meals over the past 1 week. Labs and medications reviewed.   No nutrition interventions warranted at this time. If nutrition issues arise, please consult RD.     Jarome Matin, MS, RD, LDN, CNSC Inpatient Clinical Dietitian RD pager # available in [place]  After hours/weekend pager # available in [facility]

## 2019-11-01 NOTE — Progress Notes (Addendum)
[facility]  PROGRESS NOTE  Daisy [name] HKV:[id number] DOB: 04/20/1951 DOA: 10/17/2019 PCP: Alvester Chou, NP   Brief HPI:   69 year old female was admitted on 10/17/2019 with fatigue, exertional dyspnea, hypercarbia.  Was found to be in decompensated diastolic heart failure and cor pulmonale in setting of untreated OSA.  She improved with positive pressure ventilation and IV diuresis.  Urine culture grew E. coli which was pansensitive.  Subjective   Patient seen and examined, breathing is somewhat better.  Still requiring 2 to 3 L/min of oxygen.  Diuresing well with Demadex.   Assessment/Plan:     1. Acute on chronic hypoxic and hypercarbic respiratory failure-improving, secondary to decompensated diastolic CHF, cor pulmonale, pulmonary edema.  Patient also has untreated OSA.  Requiring BiPAP at nighttime and now requiring 1 to 2  L/min of oxygen during daytime.    PCCM was consulted and has signed off.  IV furosemide has been changed to torsemide 10 mg p.o. twice daily.  Continue torsemide at current dose. 2. Obstructive sleep apnea-patient requiring BiPAP at bedtime.  She will need a sleep study done with pulmonology as outpatient.  PCCM to arrange that as outpatient.  Patient will need Trelegy ventilator for home.   3. Acute on chronic diastolic CHF-patient's BNP on admission was 769, was started on IV Lasix 40 mg twice a day.  Has been switched to Demadex 10 mg twice daily from 10/23/2019.  Patient developed hypotension after starting Entresto, required Levophed gtt. Blood pressure is stable at this time.  Will closely monitor patient's blood pressure and adjust Demadex as needed.  [name] was held due to hypotension. 4. CKD stage IIIb-patient's creatinine is 1.33 at baseline.  Started on Demadex as above.   Potassium supplementation was discontinued due to mild hyperkalemia.  Will restart potassium K-Dur 20 milliequivalents p.o. daily. 5. Hypokalemia-potassium is 3.4, secondary to  Demadex.  Will restart potassium supplementation with K. Dur 20 mEq p.o. daily. 6. UTI-had E. coli growing in the urine culture, which was pansensitive.  Patient received ceftriaxone on 10/17/2019 and then Keflex from 10/17/2019 to  10/22/2019.     SpO2: (!) 89 % O2 Flow Rate (L/min): 1 L/min FiO2 (%): 60 %      Lab Results  Component Value Date   SARSCOV2NAA NEGATIVE 10/17/2019     CBG: No results for input(s): GLUCAP in the last 168 hours.  CBC: Recent Labs  Lab 10/26/19 0226 10/27/19 0218  WBC 6.6 7.0  NEUTROABS 4.7  --   HGB 13.3 13.4  HCT 44.8 45.2  MCV 100.4* 100.7*  PLT 164 564    Basic Metabolic Panel: Recent Labs  Lab 10/26/19 0226 10/27/19 0218 10/29/19 0254 10/30/19 0324 11/01/19 0243  NA 136 136 135 140 142  K 4.4 4.9 5.1 4.0 3.4*  CL 93* 96* 98 99 98  CO2 34* 32 28 33* 33*  GLUCOSE 128* 112* 99 112* 113*  BUN 31* 31* 21 21 21   CREATININE 1.49* 1.33* 1.27* 1.33* 1.32*  CALCIUM 8.9 9.0 9.0 9.1 9.4     Liver Function Tests: Recent Labs  Lab 10/26/19 0226 10/27/19 0218  AST 18 16  ALT 17 17  ALKPHOS 61 64  BILITOT 0.9 1.0  PROT 6.3* 6.5  ALBUMIN 3.1* 3.2*        DVT prophylaxis: Lovenox  Code Status: Full code  Family Communication: No family at bedside  Disposition Plan:   Status is: Inpatient  Dispo: The patient is from: Home  Anticipated d/c is [facility] nursing facility              Anticipated d/c date is: 11/02/2019              Patient currently requiring BiPAP at nighttime with oxygen during daytime.  Barrier to discharge-awaiting placement at skilled nursing facility for rehab        Scheduled medications:  . anastrozole  1 mg Oral Daily  . aspirin  81 mg Oral Daily  . capsaicin   Topical BID  . chlorhexidine  15 mL Mouth Rinse BID  . Chlorhexidine Gluconate Cloth  6 each Topical Daily  . cilostazol  100 mg Oral BID  . enoxaparin (LOVENOX) injection  70 mg Subcutaneous Daily  . mouth rinse  15 mL  Mouth Rinse q12n4p  . metoprolol succinate  12.5 mg Oral Daily  . multivitamin  1 tablet Oral Daily  . nicotine  7 mg Transdermal Daily  . pantoprazole  40 mg Oral Daily  . sodium chloride flush  3 mL Intravenous Q12H  . torsemide  10 mg Oral BID    Consultants:  PCCM  Procedures:    Antibiotics:   Anti-infectives (From admission, onward)   Start     Dose/Rate Route Frequency Ordered Stop   10/20/19 2200  cephALEXin (KEFLEX) 250 MG/5ML suspension 500 mg  Status:  Discontinued     500 mg Oral Every 12 hours 10/20/19 2108 10/22/19 0924   10/18/19 0600  cephALEXin (KEFLEX) capsule 500 mg  Status:  Discontinued     500 mg Oral Every 12 hours 10/17/19 1608 10/20/19 2108   10/17/19 1600  cefTRIAXone (ROCEPHIN) 1 g in sodium chloride 0.9 % 100 mL IVPB     1 g 200 mL/hr over 30 Minutes Intravenous  Once 10/17/19 1545 10/17/19 1751       Objective   Vitals:   11/01/19 0500 11/01/19 0600 11/01/19 0700 11/01/19 0800  BP: (!) 123/47 (!) 134/48 (!) 137/54 126/64  Pulse: 78 87 81 86  Resp: 16 (!) 22 (!) 24 17  Temp:    98 F (36.7 C)  TempSrc:    Oral  SpO2: 93% 94% 90% (!) 89%  Weight: (!) 148.7 kg     Height:        Intake/Output Summary (Last 24 hours) at 11/01/2019 1038 Last data filed at 11/01/2019 5701 Gross per 24 hour  Intake 403 ml  Output 3125 ml  Net -2722 ml    05/16 1901 - 05/18 0700 In: 3 [I.V.:3] Out: 2125 [Urine:2125]  Filed Weights   10/30/19 0500 10/31/19 0500 11/01/19 0500  Weight: (!) 146.7 kg (!) 149.1 kg (!) 148.7 kg    Physical Examination:    General: Appears in no acute distress  Cardiovascular-S1-S2, regular, no murmur auscultated  Respiratory: Decreased breath sounds at lung bases  Abdomen: Soft, nontender, no organomegaly  Extremities: Trace edema in the lower extremities  Neurologic: Alert, oriented x3, intact insight and judgment, no focal deficit noted     BNP (last 3 results) Recent Labs    10/17/19 1329  BNP  769.5*    [place]   [facility] If 7PM-7AM, please contact night-coverage at [email or web address], Office  [phone]   11/01/2019, 10:38 AM  LOS: 15 days

## 2019-11-02 ENCOUNTER — Inpatient Hospital Stay (HOSPITAL_COMMUNITY)
Admission: EM | Admit: 2019-11-02 | Discharge: 2019-11-07 | DRG: 189 | Disposition: A | Discharge status: Home Health | Payer: Medicare PPO | Attending: Internal Medicine | Admitting: Internal Medicine

## 2019-11-02 ENCOUNTER — Other Ambulatory Visit: Payer: Self-pay

## 2019-11-02 ENCOUNTER — Encounter (HOSPITAL_COMMUNITY): Payer: Self-pay | Admitting: Emergency Medicine

## 2019-11-02 DIAGNOSIS — R0602 Shortness of breath: Secondary | ICD-10-CM | POA: Diagnosis not present

## 2019-11-02 DIAGNOSIS — J9611 Chronic respiratory failure with hypoxia: Principal | ICD-10-CM | POA: Diagnosis present

## 2019-11-02 DIAGNOSIS — I739 Peripheral vascular disease, unspecified: Secondary | ICD-10-CM | POA: Diagnosis present

## 2019-11-02 DIAGNOSIS — I5032 Chronic diastolic (congestive) heart failure: Secondary | ICD-10-CM | POA: Diagnosis present

## 2019-11-02 DIAGNOSIS — N1832 Chronic kidney disease, stage 3b: Secondary | ICD-10-CM | POA: Diagnosis present

## 2019-11-02 DIAGNOSIS — Z6841 Body Mass Index (BMI) 40.0 and over, adult: Secondary | ICD-10-CM

## 2019-11-02 DIAGNOSIS — F17290 Nicotine dependence, other tobacco product, uncomplicated: Secondary | ICD-10-CM | POA: Diagnosis present

## 2019-11-02 DIAGNOSIS — R0603 Acute respiratory distress: Secondary | ICD-10-CM | POA: Diagnosis present

## 2019-11-02 DIAGNOSIS — I5031 Acute diastolic (congestive) heart failure: Secondary | ICD-10-CM

## 2019-11-02 DIAGNOSIS — J9610 Chronic respiratory failure, unspecified whether with hypoxia or hypercapnia: Secondary | ICD-10-CM

## 2019-11-02 DIAGNOSIS — E785 Hyperlipidemia, unspecified: Secondary | ICD-10-CM | POA: Diagnosis present

## 2019-11-02 DIAGNOSIS — I10 Essential (primary) hypertension: Secondary | ICD-10-CM | POA: Diagnosis present

## 2019-11-02 DIAGNOSIS — I130 Hypertensive heart and chronic kidney disease with heart failure and stage 1 through stage 4 chronic kidney disease, or unspecified chronic kidney disease: Secondary | ICD-10-CM | POA: Diagnosis present

## 2019-11-02 DIAGNOSIS — K219 Gastro-esophageal reflux disease without esophagitis: Secondary | ICD-10-CM | POA: Diagnosis present

## 2019-11-02 DIAGNOSIS — Z170 Estrogen receptor positive status [ER+]: Secondary | ICD-10-CM

## 2019-11-02 DIAGNOSIS — Z853 Personal history of malignant neoplasm of breast: Secondary | ICD-10-CM

## 2019-11-02 DIAGNOSIS — J9612 Chronic respiratory failure with hypercapnia: Secondary | ICD-10-CM | POA: Diagnosis present

## 2019-11-02 DIAGNOSIS — C50411 Malignant neoplasm of upper-outer quadrant of right female breast: Secondary | ICD-10-CM | POA: Diagnosis present

## 2019-11-02 DIAGNOSIS — C50412 Malignant neoplasm of upper-outer quadrant of left female breast: Secondary | ICD-10-CM

## 2019-11-02 DIAGNOSIS — E662 Morbid (severe) obesity with alveolar hypoventilation: Secondary | ICD-10-CM | POA: Diagnosis present

## 2019-11-02 DIAGNOSIS — Z79811 Long term (current) use of aromatase inhibitors: Secondary | ICD-10-CM

## 2019-11-02 DIAGNOSIS — Z20822 Contact with and (suspected) exposure to covid-19: Secondary | ICD-10-CM | POA: Diagnosis present

## 2019-11-02 DIAGNOSIS — Z923 Personal history of irradiation: Secondary | ICD-10-CM

## 2019-11-02 DIAGNOSIS — J984 Other disorders of lung: Secondary | ICD-10-CM | POA: Diagnosis present

## 2019-11-02 DIAGNOSIS — Z90710 Acquired absence of both cervix and uterus: Secondary | ICD-10-CM

## 2019-11-02 LAB — POTASSIUM: Potassium: 3.8 mmol/L (ref 3.5–5.1)

## 2019-11-02 MED ORDER — PANTOPRAZOLE SODIUM 20 MG PO TBEC
20.0000 mg | DELAYED_RELEASE_TABLET | Freq: Every day | ORAL | Status: DC
  Administered 2019-11-03 – 2019-11-07 (×5): 20 mg via ORAL
  Filled 2019-11-02 (×5): qty 1

## 2019-11-02 MED ORDER — ACETAMINOPHEN 325 MG PO TABS: 650.0000 mg | ORAL_TABLET | ORAL | Status: AC | PRN

## 2019-11-02 MED ORDER — INSULIN ASPART 100 UNIT/ML ~~LOC~~ SOLN
0.0000 [IU] | Freq: Three times a day (TID) | SUBCUTANEOUS | Status: DC
  Filled 2019-11-02: qty 0.06

## 2019-11-02 MED ORDER — CAPSAICIN 0.025 % EX CREA: TOPICAL_CREAM | Freq: Two times a day (BID) | CUTANEOUS | 0 refills | Status: DC

## 2019-11-02 MED ORDER — METOPROLOL SUCCINATE ER 25 MG PO TB24
12.5000 mg | ORAL_TABLET | Freq: Every day | ORAL | Status: DC
  Administered 2019-11-03 – 2019-11-07 (×5): 12.5 mg via ORAL
  Filled 2019-11-02 (×5): qty 1

## 2019-11-02 MED ORDER — POLYETHYLENE GLYCOL 3350 17 G PO PACK: 17.0000 g | PACK | Freq: Every day | ORAL | 0 refills | Status: DC | PRN

## 2019-11-02 MED ORDER — HYDROCODONE-ACETAMINOPHEN 5-325 MG PO TABS: 1.0000 | ORAL_TABLET | ORAL | Status: DC | PRN

## 2019-11-02 MED ORDER — METOPROLOL SUCCINATE ER 25 MG PO TB24: 12.5000 mg | ORAL_TABLET | Freq: Every day | ORAL | Status: DC

## 2019-11-02 MED ORDER — POTASSIUM CHLORIDE CRYS ER 20 MEQ PO TBCR: 20.0000 meq | EXTENDED_RELEASE_TABLET | Freq: Every day | ORAL | Status: DC

## 2019-11-02 MED ORDER — ANASTROZOLE 1 MG PO TABS
1.0000 mg | ORAL_TABLET | Freq: Every day | ORAL | Status: DC
  Administered 2019-11-03 – 2019-11-07 (×5): 1 mg via ORAL
  Filled 2019-11-02 (×5): qty 1

## 2019-11-02 MED ORDER — CILOSTAZOL 100 MG PO TABS
100.0000 mg | ORAL_TABLET | Freq: Two times a day (BID) | ORAL | Status: DC
  Administered 2019-11-03 – 2019-11-07 (×10): 100 mg via ORAL
  Filled 2019-11-02 (×10): qty 1

## 2019-11-02 MED ORDER — TORSEMIDE 10 MG PO TABS
10.0000 mg | ORAL_TABLET | Freq: Two times a day (BID) | ORAL | Status: DC
  Administered 2019-11-03: 10 mg via ORAL
  Filled 2019-11-02: qty 1

## 2019-11-02 MED ORDER — ASPIRIN 81 MG PO CHEW
81.0000 mg | CHEWABLE_TABLET | Freq: Every day | ORAL | Status: DC
  Administered 2019-11-03 – 2019-11-07 (×5): 81 mg via ORAL
  Filled 2019-11-02 (×5): qty 1

## 2019-11-02 MED ORDER — ENOXAPARIN SODIUM 60 MG/0.6ML ~~LOC~~ SOLN
60.0000 mg | Freq: Every day | SUBCUTANEOUS | Status: DC
  Administered 2019-11-03 – 2019-11-06 (×5): 60 mg via SUBCUTANEOUS
  Filled 2019-11-02 (×6): qty 0.6

## 2019-11-02 MED ORDER — PROSIGHT PO TABS
1.0000 | ORAL_TABLET | Freq: Every day | ORAL | Status: DC
  Administered 2019-11-03 – 2019-11-07 (×5): 1 via ORAL
  Filled 2019-11-02 (×5): qty 1

## 2019-11-02 MED ORDER — POLYETHYLENE GLYCOL 3350 17 G PO PACK: 17.0000 g | PACK | Freq: Every day | ORAL | Status: DC | PRN

## 2019-11-02 MED ORDER — TORSEMIDE 10 MG PO TABS: 10.0000 mg | ORAL_TABLET | Freq: Two times a day (BID) | ORAL | Status: DC

## 2019-11-02 MED ORDER — ALBUTEROL SULFATE HFA 108 (90 BASE) MCG/ACT IN AERS: 2.0000 | INHALATION_SPRAY | Freq: Four times a day (QID) | RESPIRATORY_TRACT | Status: DC | PRN

## 2019-11-02 MED ORDER — ACETAMINOPHEN 325 MG PO TABS: 650.0000 mg | ORAL_TABLET | ORAL | Status: DC | PRN

## 2019-11-02 NOTE — ED Notes (Signed)
ED Provider at bedside. 

## 2019-11-02 NOTE — Progress Notes (Signed)
Attempted to call report to [facility] [phone]) multiple times with no success. Last time I called I was told they would give the receiving nurse my number to call me back in order to give report.  Awaiting return call.

## 2019-11-02 NOTE — ED Provider Notes (Signed)
[place] DEPT Provider Note   CSN: [id number] Arrival date & time: 11/02/19  2125     History Chief Complaint  Patient presents with  . Needs BI PAP    Daisy [name] is a 69 y.o. female.  Patient is a 69 year old female with CHF with chronic respiratory failure who was brought in by her daughter today from her skilled nursing facility just 6 hours after being discharged from her hospital stay here for a lengthy stay due to respiratory failure.  The daughter reports that when they arrived to the skilled nursing facility she had to wait 3 hours for her supplemental oxygen.  She was also told that they did not have her BiPAP for the night and would not know when she would get it.  Patient and daughter were very concerned because when patient was discharged from her hospital stay she was told that she required 2 L of oxygen all the time and required BiPAP nightly.  Daughter reports patient was satting 88% at rest on room air.  Daughter also reports that she was not given any walker or assistance to use the bedside commode and was told that she would just have to wear depends until she was evaluated by physical therapy tomorrow.  Patient reports that she has never been incontinent and did not want wear depends.  They were concerned for the patient's wellbeing and the quality of care that they were receiving and so they came back to the emergency department.        Past Medical History:  Diagnosis Date  . Acid reflux   . Arthritis   . Cholecystitis   . Depression   . Dyspnea   . History of radiation therapy 01/20/17-03/02/17   left breast was treated to 50.4 Gy in 18 fractions  . Hyperlipemia   . Irregular heart rate   . Malignant neoplasm of upper-outer quadrant of left breast in female, estrogen receptor positive ([place]) 09/30/2016  . Obesity   . Peripheral vascular disease ([place])   . Pneumonia     Patient Active Problem List   Diagnosis Date  Noted  . Acute respiratory distress 11/02/2019  . Acute on chronic respiratory failure with hypoxia and hypercapnia ([place]) 10/22/2019  . Slurred speech   . Acute heart failure ([place]) 10/17/2019  . History of breast cancer 10/17/2019  . AKI (acute kidney injury) ([place]) 10/17/2019  . Essential hypertension 10/17/2019  . Urinary tract infection with hematuria   . Breast cancer of upper-outer quadrant of right female breast ([place]) 11/19/2016  . Malignant neoplasm of upper-outer quadrant of left breast in female, estrogen receptor positive ([place]) 09/30/2016  . Claudication in peripheral vascular disease ([place]) 04/04/2014  . Obesity hypoventilation syndrome ([place]) 04/04/2014  . GERD (gastroesophageal reflux disease) 04/04/2014    Past Surgical History:  Procedure Laterality Date  . ABDOMINAL HYSTERECTOMY    . BREAST LUMPECTOMY Left 11/19/2016   BREAST LUMPECTOMY WITH RADIOACTIVE SEED AND SENTINEL LYMPH NODE BIOPSY (Left)  . BREAST LUMPECTOMY WITH RADIOACTIVE SEED AND SENTINEL LYMPH NODE BIOPSY Left 11/19/2016   Procedure: BREAST LUMPECTOMY WITH RADIOACTIVE SEED AND SENTINEL LYMPH NODE BIOPSY;  Surgeon: Rolm Bookbinder, MD;  Location: [facility];  Service: General;  Laterality: Left;  . CESAREAN SECTION    . CESAREAN SECTION     x 2  . CHOLECYSTECTOMY  2007  . KNEE ARTHROSCOPY     bilateral  . KNEE SURGERY Bilateral   . LOWER EXTREMITY ANGIOGRAM N/A 04/04/2014  Procedure: LOWER EXTREMITY ANGIOGRAM;  Surgeon: Laverda Page, MD;  Location: [facility] CATH LAB;  Service: Cardiovascular;  Laterality: N/A;  . OVARIAN CYST REMOVAL    . RE-EXCISION OF BREAST LUMPECTOMY Left 12/12/2016   Procedure: RE-EXCISION OF LEFT BREAST LUMPECTOMY;  Surgeon: Rolm Bookbinder, MD;  Location: [place];  Service: General;  Laterality: Left;  . ROTATOR CUFF REPAIR Right   . TONSILLECTOMY    . WRIST SURGERY Bilateral      OB History   No obstetric history on file.     Family History  Problem Relation Age of Onset  .  Heart disease Mother   . Lung cancer Father   . Heart disease Father   . Heart disease Brother     Social History   Tobacco Use  . Smoking status: Current Every Day Smoker    Packs/day: 0.50    Years: 30.00    Pack years: 15.00    Types: E-cigarettes  . Smokeless tobacco: Never Used  Substance Use Topics  . Alcohol use: No  . Drug use: No    Home Medications Prior to Admission medications   Medication Sig Start Date End Date Taking? Authorizing Provider  acetaminophen (TYLENOL) 325 MG tablet Take 2 tablets (650 mg total) by mouth every 4 (four) hours as needed for headache or mild pain. 11/02/19   Guilford Shi, MD  albuterol (PROAIR HFA) 108 (90 BASE) MCG/ACT inhaler Inhale 2 puffs into the lungs every 6 (six) hours as needed for wheezing or shortness of breath.    [provider]  anastrozole (ARIMIDEX) 1 MG tablet Take 1 tablet (1 mg total) by mouth daily. 08/31/19   Magrinat, Virgie Dad, MD  aspirin 81 MG chewable tablet Chew 81 mg by mouth daily.    [provider]  capsaicin (ZOSTRIX) 0.025 % cream Apply topically 2 (two) times daily. 11/02/19   Guilford Shi, MD  cilostazol (PLETAL) 100 MG tablet Take 1 tablet (100 mg total) by mouth 2 (two) times daily. 03/08/19   Adrian Prows, MD  diclofenac sodium (VOLTAREN) 1 % GEL Apply 4 g topically 4 (four) times daily as needed (knee and back pain). Knees and back for pain 02/08/17   [provider]  metoprolol succinate (TOPROL-XL) 25 MG 24 hr tablet Take 0.5 tablets (12.5 mg total) by mouth daily. 11/03/19   Guilford Shi, MD  Multiple Vitamins-Minerals (PRESERVISION AREDS 2 PO) Take 1 tablet by mouth daily.     [provider]  pantoprazole (PROTONIX) 20 MG tablet Take 20 mg by mouth daily.    [provider]  polyethylene glycol (MIRALAX / GLYCOLAX) 17 g packet Take 17 g by mouth daily as needed for mild constipation. 11/02/19   Guilford Shi, MD  potassium chloride SA (KLOR-CON)  20 MEQ tablet Take 1 tablet (20 mEq total) by mouth daily. 11/03/19   Guilford Shi, MD  Tetrahydroz-Dextran-PEG-Povid (EYE DROPS ADVANCED RELIEF) 0.05-0.1-1-1 % SOLN Place 1 drop into both eyes 2 (two) times daily.     [provider]  torsemide (DEMADEX) 10 MG tablet Take 1 tablet (10 mg total) by mouth 2 (two) times daily. 11/02/19   Guilford Shi, MD    Allergies    Levaquin [levofloxacin] and Sulfur  Review of Systems   Review of Systems  Constitutional: Negative for chills and fever.  HENT: Negative.   Respiratory: Positive for shortness of breath. Negative for cough.   Cardiovascular: Negative for chest pain.  Gastrointestinal: Negative for nausea and vomiting.  Genitourinary: Negative for dysuria.  Musculoskeletal: Negative for back pain.  Skin: Negative for rash.  Neurological: Negative for dizziness.  Psychiatric/Behavioral: Negative for confusion.    Physical Exam Updated Vital Signs BP 135/68   Pulse 92   Temp 98.2 F (36.8 C) (Oral)   Resp 15   Ht 5\' 6"  (1.676 m)   Wt (!) 149.2 kg   SpO2 96%   BMI 53.09 kg/m   Physical Exam Vitals and nursing note reviewed.  Constitutional:      Appearance: Normal appearance.  HENT:     Head: Normocephalic.  Eyes:     Conjunctiva/sclera: Conjunctivae normal.  Cardiovascular:     Rate and Rhythm: Normal rate and regular rhythm.  Pulmonary:     Effort: Pulmonary effort is normal.     Breath sounds: Normal breath sounds.     Comments: On supplemental oxygen Skin:    General: Skin is dry.  Neurological:     Mental Status: She is alert.  Psychiatric:        Mood and Affect: Mood normal.     ED Results / Procedures / Treatments   Labs (all labs ordered are listed, but only abnormal results are displayed) Labs Reviewed  HEMOGLOBIN A1C  CBC  COMPREHENSIVE METABOLIC PANEL  CBC  MAGNESIUM  COMPREHENSIVE METABOLIC PANEL    EKG None  Radiology No results found.  Procedures Procedures  (including critical care time)  Medications Ordered in ED Medications  cilostazol (PLETAL) tablet 100 mg (has no administration in time range)  torsemide (DEMADEX) tablet 10 mg (has no administration in time range)  aspirin chewable tablet 81 mg (has no administration in time range)  albuterol (VENTOLIN HFA) 108 (90 Base) MCG/ACT inhaler 2 puff (has no administration in time range)  PreserVision AREDS 2 CAPS (has no administration in time range)  pantoprazole (PROTONIX) EC tablet 20 mg (has no administration in time range)  anastrozole (ARIMIDEX) tablet 1 mg (has no administration in time range)  acetaminophen (TYLENOL) tablet 650 mg (has no administration in time range)  metoprolol succinate (TOPROL-XL) 24 hr tablet 12.5 mg (has no administration in time range)  polyethylene glycol (MIRALAX / GLYCOLAX) packet 17 g (has no administration in time range)  potassium chloride SA (KLOR-CON) CR tablet 20 mEq (has no administration in time range)  enoxaparin (LOVENOX) injection 40 mg (has no administration in time range)  HYDROcodone-acetaminophen (NORCO/VICODIN) 5-325 MG per tablet 1-2 tablet (has no administration in time range)  insulin aspart (novoLOG) injection 0-6 Units (has no administration in time range)    ED Course  I have reviewed the triage vital signs and the nursing notes.  Pertinent labs & imaging results that were available during my care of the patient were reviewed by me and considered in my medical decision making (see chart for details).  Clinical Course as of Nov 02 2335  Wed Nov 02, 2019  2309 Patient presenting to ER after being d/c from a lengthy hospital stay for respiratory failure related to CHF. Per patient and her daughter, the nursing home had none of her medication orders, did not have her required bipap and they were concerned for her well being so she was brought here. Well appearing, NAD here in ED   [KM]  2326 Dr. Reesa Chew to admit patient   [KM]    Clinical  Course User Index [KM] Kristine Royal   MDM Rules/Calculators/A&P  The patient appears reasonably stabilized for admission considering the current resources, flow, and capabilities available in the ED at this time, and I doubt any other [facility] requiring further screening and/or treatment in the ED prior to admission.  Final Clinical Impression(s) / ED Diagnoses Final diagnoses:  None    Rx / DC Orders ED Discharge Orders    None       Kristine Royal 11/02/19 2337    Daleen Bo, MD 11/03/19 1052

## 2019-11-02 NOTE — TOC Progression Note (Addendum)
Transition of Care [facility]) - Progression Note    Patient Details  Name: Daisy [name] MRN: [id number] Date of Birth: [birth year]  Transition of [facility] CM/SW Contact  Leeroy Cha, RN Phone Number: 11/02/2019, 9:32 AM  Clinical Narrative:  TCT [facility]-does not need covid if vaccinated and does have a bed.  md notified Dc summary sent to [facility] Message left for [name] the daughter that patient will be going to [facility] today. Expected Discharge Plan: Skilled Nursing Facility Barriers to Discharge: Insurance Authorization  Expected Discharge Plan and Services Expected Discharge Plan: [place]   Discharge Planning Services: CM Consult   Living arrangements for the past 2 months: Single Family Home                                       Social Determinants of Health (SDOH) Interventions    Readmission Risk Interventions No flowsheet data found.

## 2019-11-02 NOTE — Discharge Summary (Addendum)
Physician Discharge Summary  Daisy [name] WIO:[id number] DOB: [birth year] DOA: 10/17/2019  PCP: Daisy Chou, NP  Admit date: 10/17/2019 Discharge date: 11/02/2019 Consultations: Pulmonary  Admitted From: home Disposition: SNF  Discharge Diagnoses:  Active Problems:   Claudication in peripheral vascular disease (HCC)   Obesity hypoventilation syndrome (HCC)   GERD (gastroesophageal reflux disease)   Acute heart failure (HCC)   History of breast cancer   AKI (acute kidney injury) ([place])   Essential hypertension   Slurred speech   Acute on chronic respiratory failure with hypoxia and hypercapnia [facility])   Hospital Course Summary: 69 year old female was admitted on 10/17/2019 with fatigue, exertional dyspnea, hypercarbia.  Was found to be in decompensated diastolic heart failure and cor pulmonale in setting of untreated OSA.  She improved with positive pressure ventilation and IV diuresis.  Urine culture grew E. coli which was pansensitive.  1. Acute on chronic hypoxic and hypercarbic respiratory failure-multifactorial secondary to decompensated diastolic CHF, cor pulmonale, pulmonary edema.  Patient also has untreated OSA.  Requiring BiPAP at nighttime and now requiring ~ 2  L/min of oxygen via [place] during daytime.    PCCM was consulted who recommended diuresis, diligent NIPPV use whenever she is sleeping, OOB mobility as much as possible, repeat sleep study as outpatient. Initially diuresed with  IV furosemide which has been changed to torsemide 10 mg p.o. twice daily.  Continue torsemide at current dose upon discharge. She does report chronic leg edema/venous stasis at baseline and now close to baseline. 2. Obstructive sleep apnea-patient requiring BiPAP at bedtime.  She will need a sleep study done with pulmonology as outpatient.  PCCM to arrange that as outpatient.  Patient will need BIPAP qhs at setting outlined below--Trilogy ventilator when goes home.   3. Acute on chronic diastolic  CHF-patient's BNP on admission was 769, was started on IV Lasix 40 mg twice a day.  Has been switched to Demadex 10 mg twice daily from 10/23/2019.  Patient developed hypotension after starting Entresto, required Levophed gtt. Blood pressure is stable at this time.  Will closely monitor patient's blood pressure and adjust Demadex as needed.  [name] was deferred due to hypotension. 4. CKD stage IIIb-patient's creatinine is 1.33 at baseline.  Started on Demadex as above.   Potassium supplementation was discontinued during the hospital course due to mild hyperkalemia. Resumed potassium K-Dur 20 milliequivalents p.o. daily as labs on 5/18 showed -potassium 3.4, secondary to Demadex. Avoid NSAID (was on Naproxen) and other nephrotoxic agents 5. UTI-had E. coli growing in the urine culture, which was pansensitive.  Patient received ceftriaxone on 10/17/2019 and then Keflex from 10/17/2019 to  10/22/2019. 6. GERD: resume home dose PPI 7. PAD : resume asa, pletal 8. Chronic knee arthritis: Avoid NSAIDS, can use topical meds    Discharge Exam: Vitals:   11/02/19 0800 11/02/19 0900  BP: (!) 129/50 (!) 102/52  Pulse: 86 99  Resp: 18 17  Temp: 97.8 F (36.6 C)   SpO2: 91% 90%   Vitals:   11/02/19 0600 11/02/19 0700 11/02/19 0800 11/02/19 0900  BP: 129/60 (!) 127/52 (!) 129/50 (!) 102/52  Pulse: 82 99 86 99  Resp: (!) 22 17 18 17   Temp:   97.8 F (36.6 C)   TempSrc:   Axillary   SpO2: 93% (!) 88% 91% 90%  Weight:      Height:        General: Pt is alert, awake, not in acute distress, on 2lits [place] Cardiovascular: RRR, S1/S2 +,  no rubs, no gallops Respiratory: CTA bilaterally, no wheezing, no rhonchi Abdominal: Soft, NT, ND, bowel sounds + Extremities: chronic leg edema 1+, no cyanosis  Discharge Condition:Stable CODE STATUS: Full code Diet recommendation: Heart healthy BIPAP settings : Recommend 0 2 -2lits via [place] continuous and BIPAP QHS:  IPAP 14 mm Hg EPAP 8 mm Hg RR 14 FIO2  30% Recommendations for Outpatient Follow-up:  1. Follow up with PCP: at SNF in 3 days 2. Follow up with consultants: [facility] pulmonary clinic in 2 weeks, sleep study referral 3. Please obtain follow up labs including: BMP/CBC IN 2-3 Centre services upon discharge:  Equipment/Devices upon discharge: BIPAP /O2 at SNF   Discharge Instructions:  Discharge Instructions    (HEART FAILURE PATIENTS) Call MD:  Anytime you have any of the following symptoms: 1) 3 pound weight gain in 24 hours or 5 pounds in 1 week 2) shortness of breath, with or without a dry hacking cough 3) swelling in the hands, feet or stomach 4) if you have to sleep on extra pillows at night in order to breathe.   Complete by: As directed    Call MD for:  difficulty breathing, headache or visual disturbances   Complete by: As directed    Call MD for:  persistant dizziness or light-headedness   Complete by: As directed    Call MD for:  persistant nausea and vomiting   Complete by: As directed    Call MD for:  severe uncontrolled pain   Complete by: As directed    Call MD for:  temperature >100.4   Complete by: As directed    Diet - low sodium heart healthy   Complete by: As directed    Increase activity slowly   Complete by: As directed    Other Restrictions   Complete by: As directed    Recommend 0 2 -2lits via [place] continuous and BIPAP at night at settings :  IPAP 14 mm Hg EPAP 8 mm Hg RR 14 FIO2 30%     Allergies as of 11/02/2019      Reactions   Levaquin [levofloxacin] Other (See Comments)   Body aches   Sulfur Hives      Medication List    STOP taking these medications   naproxen sodium 220 MG tablet Commonly known as: ALEVE   triamterene-hydrochlorothiazide 37.5-25 MG tablet Commonly known as: MAXZIDE-25     TAKE these medications   acetaminophen 325 MG tablet Commonly known as: TYLENOL Take 2 tablets (650 mg total) by mouth every 4 (four) hours as needed for headache or mild pain.    anastrozole 1 MG tablet Commonly known as: ARIMIDEX Take 1 tablet (1 mg total) by mouth daily.   aspirin 81 MG chewable tablet Chew 81 mg by mouth daily.   capsaicin 0.025 % cream Commonly known as: ZOSTRIX Apply topically 2 (two) times daily.   cilostazol 100 MG tablet Commonly known as: PLETAL Take 1 tablet (100 mg total) by mouth 2 (two) times daily.   diclofenac sodium 1 % Gel Commonly known as: VOLTAREN Apply 4 g topically 4 (four) times daily as needed (knee and back pain). Knees and back for pain   Eye Drops Advanced Relief 0.05-0.1-1-1 % Soln Generic drug: Tetrahydroz-Dextran-PEG-Povid Place 1 drop into both eyes 2 (two) times daily.   metoprolol succinate 25 MG 24 hr tablet Commonly known as: TOPROL-XL Take 0.5 tablets (12.5 mg total) by mouth daily. Start taking on: Nov 03, 2019  pantoprazole 20 MG tablet Commonly known as: PROTONIX Take 20 mg by mouth daily.   polyethylene glycol 17 g packet Commonly known as: MIRALAX / GLYCOLAX Take 17 g by mouth daily as needed for mild constipation.   potassium chloride SA 20 MEQ tablet Commonly known as: KLOR-CON Take 1 tablet (20 mEq total) by mouth daily. Start taking on: Nov 03, 2019   PRESERVISION AREDS 2 PO Take 1 tablet by mouth daily.   ProAir HFA 108 (90 Base) MCG/ACT inhaler Generic drug: albuterol Inhale 2 puffs into the lungs every 6 (six) hours as needed for wheezing or shortness of breath.   torsemide 10 MG tablet Commonly known as: DEMADEX Take 1 tablet (10 mg total) by mouth 2 (two) times daily.       Allergies  Allergen Reactions  . Levaquin [Levofloxacin] Other (See Comments)    Body aches  . Sulfur Hives      The results of significant diagnostics from this hospitalization (including imaging, microbiology, ancillary and laboratory) are listed below for reference.    Labs: BNP (last 3 results) Recent Labs    10/17/19 1329  BNP 222.9*   Basic Metabolic Panel: Recent Labs  Lab  10/27/19 0218 10/29/19 0254 10/30/19 0324 11/01/19 0243 11/02/19 1000  NA 136 135 140 142  --   K 4.9 5.1 4.0 3.4* 3.8  CL 96* 98 99 98  --   CO2 32 28 33* 33*  --   GLUCOSE 112* 99 112* 113*  --   BUN 31* 21 21 21   --   CREATININE 1.33* 1.27* 1.33* 1.32*  --   CALCIUM 9.0 9.0 9.1 9.4  --    Liver Function Tests: Recent Labs  Lab 10/27/19 0218  AST 16  ALT 17  ALKPHOS 64  BILITOT 1.0  PROT 6.5  ALBUMIN 3.2*   No results for input(s): LIPASE, AMYLASE in the last 168 hours. No results for input(s): AMMONIA in the last 168 hours. CBC: Recent Labs  Lab 10/27/19 0218  WBC 7.0  HGB 13.4  HCT 45.2  MCV 100.7*  PLT 154   Cardiac Enzymes: No results for input(s): CKTOTAL, CKMB, CKMBINDEX, TROPONINI in the last 168 hours. BNP: Invalid input(s): POCBNP CBG: No results for input(s): GLUCAP in the last 168 hours. D-Dimer No results for input(s): DDIMER in the last 72 hours. Hgb A1c No results for input(s): HGBA1C in the last 72 hours. Lipid Profile No results for input(s): CHOL, HDL, LDLCALC, TRIG, CHOLHDL, LDLDIRECT in the last 72 hours. Thyroid function studies No results for input(s): TSH, T4TOTAL, T3FREE, THYROIDAB in the last 72 hours.  Invalid input(s): FREET3 Anemia work up No results for input(s): VITAMINB12, FOLATE, FERRITIN, TIBC, IRON, RETICCTPCT in the last 72 hours. Urinalysis    Component Value Date/Time   COLORURINE YELLOW 10/17/2019 1429   APPEARANCEUR CLOUDY (A) 10/17/2019 1429   APPEARANCEUR Cloudy 04/07/2014 2005   LABSPEC 1.010 10/17/2019 1429   LABSPEC 1.006 04/07/2014 2005   PHURINE 6.0 10/17/2019 1429   GLUCOSEU NEGATIVE 10/17/2019 1429   GLUCOSEU Negative 04/07/2014 2005   HGBUR SMALL (A) 10/17/2019 1429   BILIRUBINUR NEGATIVE 10/17/2019 1429   BILIRUBINUR Negative 04/07/2014 2005   KETONESUR NEGATIVE 10/17/2019 1429   PROTEINUR NEGATIVE 10/17/2019 1429   NITRITE POSITIVE (A) 10/17/2019 1429   LEUKOCYTESUR LARGE (A) 10/17/2019 1429    LEUKOCYTESUR 3+ 04/07/2014 2005   Sepsis Labs Invalid input(s): PROCALCITONIN,  WBC,  LACTICIDVEN Microbiology No results found for this or any previous visit (  from the past 240 hour(s)).  Procedures/Studies: DG Chest 2 View  Result Date: 10/18/2019 CLINICAL DATA:  Congestive heart failure. EXAM: CHEST - 2 VIEW COMPARISON:  10/17/2019 FINDINGS: Cardiac enlargement. Decreased lung volumes. Small pleural effusions and mild interstitial edema. Bibasilar atelectasis. No airspace consolidation. IMPRESSION: Cardiac enlargement and CHF. Electronically Signed   By: Kerby Moors M.D.   On: 10/18/2019 10:27   CT HEAD WO CONTRAST  Result Date: 10/18/2019 CLINICAL DATA:  Neurologic deficit. EXAM: CT HEAD WITHOUT CONTRAST TECHNIQUE: Contiguous axial images were obtained from the base of the skull through the vertex without intravenous contrast. COMPARISON:  None. FINDINGS: Brain: The ventricles, cisterns and other CSF spaces are normal. There is minimal chronic ischemic microvascular disease. There is no mass, mass effect, shift of midline structures or acute hemorrhage. No evidence of acute infarction. Vascular: No hyperdense vessel or unexpected calcification. Skull: Normal. Negative for fracture or focal lesion. Sinuses/Orbits: No acute finding. Other: None. IMPRESSION: No acute findings. Mild chronic ischemic microvascular disease. Electronically Signed   By: Marin Olp M.D.   On: 10/18/2019 14:54   DG CHEST PORT 1 VIEW  Result Date: 10/24/2019 CLINICAL DATA:  Shortness of breath. EXAM: PORTABLE CHEST 1 VIEW COMPARISON:  Oct 18, 2019. FINDINGS: Stable cardiomediastinal silhouette. No pneumothorax is noted. Mild bibasilar subsegmental atelectasis or edema is noted. Small pleural effusions may be present. Bony thorax is unremarkable. IMPRESSION: Mild bibasilar subsegmental atelectasis or edema is noted with possible small pleural effusions. Aortic Atherosclerosis (ICD10-I70.0). Electronically Signed    By: Marijo Conception M.D.   On: 10/24/2019 11:36   DG Chest Portable 1 View  Result Date: 10/17/2019 CLINICAL DATA:  Shortness of breath. Generalized weakness. Bilateral leg swelling and numbness. EXAM: PORTABLE CHEST 1 VIEW COMPARISON:  11/17/2014. FINDINGS: Enlarged cardiac silhouette with an interval significant increase in size. Interval mild linear density in the left mid and lower lung zones. Progressively prominent interstitial markings with no Kerley lines or pleural fluid seen. Aortic arch calcifications. No acute bony abnormalities. IMPRESSION: 1. Interval cardiomegaly. 2. Interval mild chronic interstitial lung disease or minimal interstitial pulmonary edema. 3. Interval mild linear atelectasis or scarring in the left mid and lower lung zones. Electronically Signed   By: Claudie Revering M.D.   On: 10/17/2019 12:21   ECHOCARDIOGRAM COMPLETE  Result Date: 10/18/2019    ECHOCARDIOGRAM REPORT   Patient Name:   Daisy [name] Date of Exam: 10/18/2019 Medical Rec #:  [id number]            Height:       67.0 in Accession #:    [id number]           Weight:       351.2 lb Date of Birth:  February 01, 1951            BSA:          2.569 m Patient Age:    20 years             BP:           125/62 mmHg Patient Gender: F                    HR:           95 bpm. Exam Location:  Inpatient Procedure: 2D Echo, Cardiac Doppler, Color Doppler and Intracardiac            Opacification Agent Indications:    CHF-Acute Systolic 660.63 / K16.01  History:  Patient has no prior history of Echocardiogram examinations.                 CHF; Risk Factors:Hypertension and Current Smoker. GERD.  Sonographer:    Vickie Epley RDCS Referring Phys: [id number] Emeterio Reeve  Sonographer Comments: Patient is morbidly obese and Technically difficult study due to poor echo windows. IMPRESSIONS  1. Left ventricular ejection fraction, by estimation, is 60 to 65%. The left ventricle has normal function. The left ventricle has no regional wall  motion abnormalities. Left ventricular diastolic parameters are indeterminate.  2. Right ventricular systolic function is normal. The right ventricular size is not well visualized.  3. The mitral valve is grossly normal. Trivial mitral valve regurgitation. No evidence of mitral stenosis.  4. The aortic valve was not well visualized. Aortic valve regurgitation is not visualized. No aortic stenosis is present.  5. The inferior vena cava is dilated in size with <50% respiratory variability, suggesting right atrial pressure of 15 mmHg. Comparison(s): No prior Echocardiogram. Conclusion(s)/Recommendation(s): Technically difficult study, even with use of echo contrast. LV function normal. Difficult to see RV, but in some views appears it may be mildly-moderately enlarged. IVC dilated and does not collapse, consistent with elevated right atrial pressure. FINDINGS  Left Ventricle: Left ventricular ejection fraction, by estimation, is 60 to 65%. The left ventricle has normal function. The left ventricle has no regional wall motion abnormalities. Definity contrast agent was given IV to delineate the left ventricular  endocardial borders. The left ventricular internal cavity size was normal in size. There is no left ventricular hypertrophy. Left ventricular diastolic parameters are indeterminate. Right Ventricle: The right ventricular size is not well visualized. Right vetricular wall thickness was not assessed. Right ventricular systolic function is normal. Left Atrium: Left atrial size was normal in size. Right Atrium: Right atrial size was not well visualized. Pericardium: There is no evidence of pericardial effusion. Presence of pericardial fat pad. Mitral Valve: The mitral valve is grossly normal. Trivial mitral valve regurgitation. No evidence of mitral valve stenosis. Tricuspid Valve: The tricuspid valve is grossly normal. Tricuspid valve regurgitation is trivial. No evidence of tricuspid stenosis. Aortic Valve: The  aortic valve was not well visualized. Aortic valve regurgitation is not visualized. No aortic stenosis is present. Pulmonic Valve: The pulmonic valve was not well visualized. Pulmonic valve regurgitation is not visualized. No evidence of pulmonic stenosis. Aorta: The aortic root was not well visualized, the ascending aorta was not well visualized and the aortic arch was not well visualized. Pulmonary Artery: The pulmonary artery is not well seen. Venous: The inferior vena cava is dilated in size with less than 50% respiratory variability, suggesting right atrial pressure of 15 mmHg. IAS/Shunts: The interatrial septum was not well visualized.  LEFT VENTRICLE PLAX 2D LVOT diam:     2.30 cm      Diastology LV SV:         92           LV e' lateral:   8.70 cm/s LV SV Index:   36           LV E/e' lateral: 11.2 LVOT Area:     4.15 cm     LV e' medial:    5.78 cm/s                             LV E/e' medial:  16.8  LV Volumes (MOD) LV vol d, MOD A2C: 102.0  ml LV vol d, MOD A4C: 84.0 ml LV vol s, MOD A2C: 32.6 ml LV vol s, MOD A4C: 31.3 ml LV SV MOD A2C:     69.4 ml LV SV MOD A4C:     84.0 ml LV SV MOD BP:      63.0 ml RIGHT VENTRICLE RV S prime:     14.60 cm/s TAPSE (M-mode): 2.2 cm LEFT ATRIUM             Index       RIGHT ATRIUM           Index LA Vol (A2C):   21.5 ml 8.37 ml/m  RA Area:     16.40 cm LA Vol (A4C):   26.2 ml 10.20 ml/m RA Volume:   45.20 ml  17.59 ml/m LA Biplane Vol: 25.5 ml 9.93 ml/m  AORTIC VALVE LVOT Vmax:   116.00 cm/s LVOT Vmean:  78.000 cm/s LVOT VTI:    0.222 m MITRAL VALVE MV Area (PHT): 2.79 cm     SHUNTS MV Decel Time: 272 msec     Systemic VTI:  0.22 m MV E velocity: 97.30 cm/s   Systemic Diam: 2.30 cm MV A velocity: 110.00 cm/s MV E/A ratio:  0.88 Buford Dresser MD Electronically signed by Buford Dresser MD Signature Date/Time: 10/18/2019/1:29:08 PM    Final    DG ESOPHAGUS W SINGLE CM (SOL OR THIN BA)  Result Date: 10/20/2019 CLINICAL DATA:  Acid reflux. Additional  history obtained from patient: Patient denies regurgitation of fluid or liquids at time of exam. EXAM: ESOPHOGRAM / BARIUM SWALLOW / BARIUM TABLET STUDY TECHNIQUE: A single contrast examination was performed using thin barium liquid. The patient was observed with fluoroscopy swallowing a 13 mm barium sulphate tablet. FLUOROSCOPY TIME:  Fluoroscopy Time:  3 minutes Radiation Exposure Index (if provided by the fluoroscopic device): 32.90 mGy Number of Acquired Spot Images: 4 COMPARISON:  CT abdomen/pelvis 03/14/2016, chest radiograph 10/18/2018 FINDINGS: Limited examination due to limited patient positioning. A single contrast study was performed. There is a left lateral contrast outpouching at the level of the upper cervical esophagus suspicious for Dolores Frame diverticulum (see and rotations on images). Elsewhere, there is no evidence of esophageal mass or mucosal abnormality. There is intermittent esophageal dysmotility. There is a small sliding hiatal hernia. Small volume gastroesophageal reflux to the level of the lower esophagus. A swallowed 13 mm barium tablet remained within the distal esophagus and did not pass into the stomach despite a prolonged period of observation. IMPRESSION: 1. 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 2. Intermittent esophageal dysmotility. 3. Small volume gastroesophageal reflux to the level of the lower esophagus. 4. Small sliding hiatal hernia. 5. A 13 mm barium tablet remained within the distal esophagus and did not pass into the stomach despite a prolonged period of observation. A mild smooth distal esophageal stricture cannot be excluded. Electronically Signed   By: Kellie Simmering DO   On: 10/20/2019 09:51     Time coordinating discharge: Over 30 minutes  SIGNED:   Guilford Shi, MD  [facility] 11/02/2019, 11:57 AM

## 2019-11-02 NOTE — Plan of Care (Signed)

## 2019-11-02 NOTE — H&P (Signed)
History and Physical    Mohini Heathcock SHF:[id number] DOB: 1950-08-02 DOA: 11/02/2019  PCP: Alvester Chou, NP Patient coming from: [facility]  Chief Complaint: Lack of equipment/BiPAP at [place]  HPI: Daisy [name] is a 69 y.o. female with medical history significant of chronic hypoxia, obstructive sleep apnea on nocturnal BiPAP, daytime 2 L nasal cannula, diastolic CHF, CKD stage IIIb, GERD, PAD, chronic osteoarthritis sent back from [place] due to lack of BiPAP machine and her medications medications.  Patient does not have any complaints saturating greater than 95% on 2 L of nasal cannula.  Patient and daughter reasonably frustrated due to this.  Patient was recently admitted here from 5/3-5/19/2021 for hypoxic respiratory failure.  During the hospitalization she was diuresed, seen by pulmonary and it was recommended she would require 2 L nasal cannula during daytime and BiPAP during nighttime.  Her IV Lasix was eventually converted to p.o. Demadex.  Also treated for UTI with Rocephin then Keflex for total of 5 days.  Patient in the ER doing well no complaints.   Review of Systems: As per HPI otherwise 10 point review of systems negative.  Review of Systems Otherwise negative except as per HPI, including: General: Denies fever, chills, night sweats or unintended weight loss. Resp: Denies cough, wheezing, shortness of breath. Cardiac: Denies chest pain, palpitations, orthopnea, paroxysmal nocturnal dyspnea. GI: Denies abdominal pain, nausea, vomiting, diarrhea or constipation GU: Denies dysuria, frequency, hesitancy or incontinence MS: Denies muscle aches, joint pain or swelling Neuro: Denies headache, neurologic deficits (focal weakness, numbness, tingling), abnormal gait Psych: Denies anxiety, depression, SI/HI/AVH Skin: Denies new rashes or lesions ID: Denies sick contacts, exotic exposures, travel  Past Medical History:  Diagnosis Date  . Acid reflux   .  Arthritis   . Cholecystitis   . Depression   . Dyspnea   . History of radiation therapy 01/20/17-03/02/17   left breast was treated to 50.4 Gy in 18 fractions  . Hyperlipemia   . Irregular heart rate   . Malignant neoplasm of upper-outer quadrant of left breast in female, estrogen receptor positive ([place]) 09/30/2016  . Obesity   . Peripheral vascular disease ([place])   . Pneumonia     Past Surgical History:  Procedure Laterality Date  . ABDOMINAL HYSTERECTOMY    . BREAST LUMPECTOMY Left 11/19/2016   BREAST LUMPECTOMY WITH RADIOACTIVE SEED AND SENTINEL LYMPH NODE BIOPSY (Left)  . BREAST LUMPECTOMY WITH RADIOACTIVE SEED AND SENTINEL LYMPH NODE BIOPSY Left 11/19/2016   Procedure: BREAST LUMPECTOMY WITH RADIOACTIVE SEED AND SENTINEL LYMPH NODE BIOPSY;  Surgeon: Rolm Bookbinder, MD;  Location: [facility];  Service: General;  Laterality: Left;  . CESAREAN SECTION    . CESAREAN SECTION     x 2  . CHOLECYSTECTOMY  2007  . KNEE ARTHROSCOPY     bilateral  . KNEE SURGERY Bilateral   . LOWER EXTREMITY ANGIOGRAM N/A 04/04/2014   Procedure: LOWER EXTREMITY ANGIOGRAM;  Surgeon: Laverda Page, MD;  Location: [facility] CATH LAB;  Service: Cardiovascular;  Laterality: N/A;  . OVARIAN CYST REMOVAL    . RE-EXCISION OF BREAST LUMPECTOMY Left 12/12/2016   Procedure: RE-EXCISION OF LEFT BREAST LUMPECTOMY;  Surgeon: Rolm Bookbinder, MD;  Location: [facility];  Service: General;  Laterality: Left;  . ROTATOR CUFF REPAIR Right   . TONSILLECTOMY    . WRIST SURGERY Bilateral     SOCIAL HISTORY:  reports that she has been smoking e-cigarettes. She has a 15.00 pack-year smoking history. She has never used smokeless tobacco.  She reports that she does not drink alcohol or use drugs.  Allergies  Allergen Reactions  . Levaquin [Levofloxacin] Other (See Comments)    Body aches  . Sulfur Hives    FAMILY HISTORY: Family History  Problem Relation Age of Onset  . Heart disease Mother   . Lung cancer Father   . Heart  disease Father   . Heart disease Brother      Prior to Admission medications   Medication Sig Start Date End Date Taking? Authorizing Provider  acetaminophen (TYLENOL) 325 MG tablet Take 2 tablets (650 mg total) by mouth every 4 (four) hours as needed for headache or mild pain. 11/02/19   Guilford Shi, MD  albuterol (PROAIR HFA) 108 (90 BASE) MCG/ACT inhaler Inhale 2 puffs into the lungs every 6 (six) hours as needed for wheezing or shortness of breath.    [provider]  anastrozole (ARIMIDEX) 1 MG tablet Take 1 tablet (1 mg total) by mouth daily. 08/31/19   Magrinat, Virgie Dad, MD  aspirin 81 MG chewable tablet Chew 81 mg by mouth daily.    [provider]  capsaicin (ZOSTRIX) 0.025 % cream Apply topically 2 (two) times daily. 11/02/19   Guilford Shi, MD  cilostazol (PLETAL) 100 MG tablet Take 1 tablet (100 mg total) by mouth 2 (two) times daily. 03/08/19   Adrian Prows, MD  diclofenac sodium (VOLTAREN) 1 % GEL Apply 4 g topically 4 (four) times daily as needed (knee and back pain). Knees and back for pain 02/08/17   [provider]  metoprolol succinate (TOPROL-XL) 25 MG 24 hr tablet Take 0.5 tablets (12.5 mg total) by mouth daily. 11/03/19   Guilford Shi, MD  Multiple Vitamins-Minerals (PRESERVISION AREDS 2 PO) Take 1 tablet by mouth daily.     [provider]  pantoprazole (PROTONIX) 20 MG tablet Take 20 mg by mouth daily.    [provider]  polyethylene glycol (MIRALAX / GLYCOLAX) 17 g packet Take 17 g by mouth daily as needed for mild constipation. 11/02/19   Guilford Shi, MD  potassium chloride SA (KLOR-CON) 20 MEQ tablet Take 1 tablet (20 mEq total) by mouth daily. 11/03/19   Guilford Shi, MD  Tetrahydroz-Dextran-PEG-Povid (EYE DROPS ADVANCED RELIEF) 0.05-0.1-1-1 % SOLN Place 1 drop into both eyes 2 (two) times daily.     [provider]  torsemide (DEMADEX) 10 MG tablet Take 1 tablet (10 mg total) by mouth 2 (two)  times daily. 11/02/19   Guilford Shi, MD    Physical Exam: Vitals:   11/02/19 2147 11/02/19 2149 11/02/19 2151  BP:   135/68  Pulse:   92  Resp:   15  Temp:  98.2 F (36.8 C)   TempSrc:  Oral   SpO2:   96%  Weight: (!) 149.2 kg    Height: 5\' 6"  (1.676 m)        Constitutional: NAD, calm, comfortable, 2 L nasal cannula Eyes: PERRL, lids and conjunctivae normal ENMT: Mucous membranes are moist. Posterior pharynx clear of any exudate or lesions.Normal dentition.  Neck: normal, supple, no masses, no thyromegaly Respiratory: clear to auscultation bilaterally, no wheezing, no crackles. Normal respiratory effort. No accessory muscle use.  Cardiovascular: Regular rate and rhythm, no murmurs / rubs / gallops.  3+ bilateral lower extremity pitting edema 2+ pedal pulses. No carotid bruits.  Abdomen: no tenderness, no masses palpated. No hepatosplenomegaly. Bowel sounds positive.  Musculoskeletal: no clubbing / cyanosis. No joint deformity upper and lower extremities. Good ROM,  no contractures. Normal muscle tone.  Skin: no rashes, lesions, ulcers. No induration Neurologic: CN 2-12 grossly intact. Sensation intact, DTR normal. Strength 5/5 in all 4.  Psychiatric: Normal judgment and insight. Alert and oriented x 3. Normal mood.     Labs on Admission: I have personally reviewed following labs and imaging studies  CBC: Recent Labs  Lab 10/27/19 0218  WBC 7.0  HGB 13.4  HCT 45.2  MCV 100.7*  PLT 161   Basic Metabolic Panel: Recent Labs  Lab 10/27/19 0218 10/29/19 0254 10/30/19 0324 11/01/19 0243 11/02/19 1000  NA 136 135 140 142  --   K 4.9 5.1 4.0 3.4* 3.8  CL 96* 98 99 98  --   CO2 32 28 33* 33*  --   GLUCOSE 112* 99 112* 113*  --   BUN 31* 21 21 21   --   CREATININE 1.33* 1.27* 1.33* 1.32*  --   CALCIUM 9.0 9.0 9.1 9.4  --    GFR: Estimated Creatinine Clearance: 60.5 mL/min (A) (by C-G formula based on SCr of 1.32 mg/dL (H)). Liver Function Tests: Recent Labs   Lab 10/27/19 0218  AST 16  ALT 17  ALKPHOS 64  BILITOT 1.0  PROT 6.5  ALBUMIN 3.2*   No results for input(s): LIPASE, AMYLASE in the last 168 hours. No results for input(s): AMMONIA in the last 168 hours. Coagulation Profile: No results for input(s): INR, PROTIME in the last 168 hours. Cardiac Enzymes: No results for input(s): CKTOTAL, CKMB, CKMBINDEX, TROPONINI in the last 168 hours. BNP (last 3 results) No results for input(s): PROBNP in the last 8760 hours. HbA1C: No results for input(s): HGBA1C in the last 72 hours. CBG: No results for input(s): GLUCAP in the last 168 hours. Lipid Profile: No results for input(s): CHOL, HDL, LDLCALC, TRIG, CHOLHDL, LDLDIRECT in the last 72 hours. Thyroid Function Tests: No results for input(s): TSH, T4TOTAL, FREET4, T3FREE, THYROIDAB in the last 72 hours. Anemia Panel: No results for input(s): VITAMINB12, FOLATE, FERRITIN, TIBC, IRON, RETICCTPCT in the last 72 hours. Urine analysis:    Component Value Date/Time   COLORURINE YELLOW 10/17/2019 1429   APPEARANCEUR CLOUDY (A) 10/17/2019 1429   APPEARANCEUR Cloudy 04/07/2014 2005   LABSPEC 1.010 10/17/2019 1429   LABSPEC 1.006 04/07/2014 2005   PHURINE 6.0 10/17/2019 1429   GLUCOSEU NEGATIVE 10/17/2019 1429   GLUCOSEU Negative 04/07/2014 2005   HGBUR SMALL (A) 10/17/2019 1429   BILIRUBINUR NEGATIVE 10/17/2019 1429   BILIRUBINUR Negative 04/07/2014 2005   KETONESUR NEGATIVE 10/17/2019 1429   PROTEINUR NEGATIVE 10/17/2019 1429   NITRITE POSITIVE (A) 10/17/2019 1429   LEUKOCYTESUR LARGE (A) 10/17/2019 1429   LEUKOCYTESUR 3+ 04/07/2014 2005   Sepsis Labs: !!!!!!!!!!!!!!!!!!!!!!!!!!!!!!!!!!!!!!!!!!!! @LABRCNTIP (procalcitonin:4,lacticidven:4) )No results found for this or any previous visit (from the past 240 hour(s)).   Radiological Exams on Admission: No results found.   All images have been reviewed by me personally.    Assessment/Plan Active Problems:   Claudication in  peripheral vascular disease (HCC)   Obesity hypoventilation syndrome (HCC)   Malignant neoplasm of upper-outer quadrant of left breast in female, estrogen receptor positive ([place])   Breast cancer of upper-outer quadrant of right female breast ([place])   Essential hypertension   Acute respiratory distress    Chronic hypoxic respiratory failure Obstructive sleep apnea Admit patient for observation, patient sent back from SNF due to lack of " BiPAP and medications" there.  Will order nocturnal BiPAP here, she can use daytime 2 L nasal cannula.  Bronchodilators as needed.  Chronic diastolic congestive heart failure Bilateral lower extremity swelling Previously did not tolerate Entresto.  Currently on metoprolol 12.5 mg daily.   Patient wants to rest tonight therefore will order 40 mg IV Lasix starting tomorrow morning.  Resume torsemide upon discharge  CKD stage IIIb Baseline creatinine 1.3.  Ordered labs.  GERD PPI  PAD Aspirin and Pletal  Chronic osteoarthritis of her knee Topical pain meds.  History of breast cancer On anastrozole  TOC consulted to look into the disposition.  DVT prophylaxis: Lovenox Code Status: Full code Family Communication: Daughter at bedside Consults called: None Admission status: Telemetry  Status is: Observation  The patient remains OBS appropriate and will d/c before 2 midnights.  Dispo: The patient is from: Home              Anticipated d/c is to: SNF              Anticipated d/c date is: 1 day              Patient currently is medically stable to d/c.  TOC team consulted to ensure rehab facility has all the necessities have patient require.  Time Spent: 65 minutes.  >50% of the time was devoted to discussing the patients care, assessment, plan and disposition with other care givers along with counseling the patient about the risks and benefits of treatment.    Saliha Salts Arsenio Loader MD [facility]  If 7PM-7AM, please contact  night-coverage   11/02/2019, 11:32 PM

## 2019-11-02 NOTE — ED Triage Notes (Signed)
Patient was discharged from the hospital today back to her facility. She needs Bi PAP. Since they do not have it there for her tonight, she was sent back to the hospital.   EMS vitals: 132/76 BP 95 HR 97% SPO2 on  2 L nasal canula.

## 2019-11-03 ENCOUNTER — Telehealth: Payer: Self-pay

## 2019-11-03 LAB — GLUCOSE, CAPILLARY
Glucose-Capillary: 99 mg/dL (ref 70–99)
Glucose-Capillary: 100 mg/dL — ABNORMAL HIGH (ref 70–99)
Glucose-Capillary: 100 mg/dL — ABNORMAL HIGH (ref 70–99)
Glucose-Capillary: 94 mg/dL (ref 70–99)
Glucose-Capillary: 120 mg/dL — ABNORMAL HIGH (ref 70–99)

## 2019-11-03 LAB — COMPREHENSIVE METABOLIC PANEL
ALT: 24 U/L (ref 0–44)
ALT: 24 U/L (ref 0–44)
AST: 24 U/L (ref 15–41)
AST: 23 U/L (ref 15–41)
Albumin: 3.1 g/dL — ABNORMAL LOW (ref 3.5–5.0)
Albumin: 3 g/dL — ABNORMAL LOW (ref 3.5–5.0)
Alkaline Phosphatase: 73 U/L (ref 38–126)
Alkaline Phosphatase: 73 U/L (ref 38–126)
Anion gap: 10 (ref 5–15)
Anion gap: 12 (ref 5–15)
BUN: 17 mg/dL (ref 8–23)
BUN: 17 mg/dL (ref 8–23)
CO2: 33 mmol/L — ABNORMAL HIGH (ref 22–32)
CO2: 29 mmol/L (ref 22–32)
Calcium: 9.1 mg/dL (ref 8.9–10.3)
Calcium: 8.9 mg/dL (ref 8.9–10.3)
Chloride: 99 mmol/L (ref 98–111)
Chloride: 99 mmol/L (ref 98–111)
Creatinine, Ser: 1.46 mg/dL — ABNORMAL HIGH (ref 0.44–1.00)
Creatinine, Ser: 1.24 mg/dL — ABNORMAL HIGH (ref 0.44–1.00)
GFR calc Af Amer: 42 mL/min — ABNORMAL LOW (ref >60)
GFR calc Af Amer: 51 mL/min — ABNORMAL LOW (ref >60)
GFR calc non Af Amer: 36 mL/min — ABNORMAL LOW (ref >60)
GFR calc non Af Amer: 44 mL/min — ABNORMAL LOW (ref >60)
Glucose, Bld: 111 mg/dL — ABNORMAL HIGH (ref 70–99)
Glucose, Bld: 103 mg/dL — ABNORMAL HIGH (ref 70–99)
Potassium: 4 mmol/L (ref 3.5–5.1)
Potassium: 3.9 mmol/L (ref 3.5–5.1)
Sodium: 142 mmol/L (ref 135–145)
Sodium: 140 mmol/L (ref 135–145)
Total Bilirubin: 0.5 mg/dL (ref 0.3–1.2)
Total Bilirubin: 0.6 mg/dL (ref 0.3–1.2)
Total Protein: 6.5 g/dL (ref 6.5–8.1)
Total Protein: 6.2 g/dL — ABNORMAL LOW (ref 6.5–8.1)

## 2019-11-03 LAB — CBC
HCT: 44.3 % (ref 36.0–46.0)
HCT: 42.8 % (ref 36.0–46.0)
Hemoglobin: 13.4 g/dL (ref 12.0–15.0)
Hemoglobin: 12.8 g/dL (ref 12.0–15.0)
MCH: 30.2 pg (ref 26.0–34.0)
MCH: 29.9 pg (ref 26.0–34.0)
MCHC: 30.2 g/dL (ref 30.0–36.0)
MCHC: 29.9 g/dL — ABNORMAL LOW (ref 30.0–36.0)
MCV: 100 fL (ref 80.0–100.0)
MCV: 100 fL (ref 80.0–100.0)
Platelets: 222 10*3/uL (ref 150–400)
Platelets: 213 10*3/uL (ref 150–400)
RBC: 4.43 MIL/uL (ref 3.87–5.11)
RBC: 4.28 MIL/uL (ref 3.87–5.11)
RDW: 14.6 % (ref 11.5–15.5)
RDW: 14.7 % (ref 11.5–15.5)
WBC: 6.5 10*3/uL (ref 4.0–10.5)
WBC: 5.5 10*3/uL (ref 4.0–10.5)
nRBC: 0 % (ref 0.0–0.2)
nRBC: 0 % (ref 0.0–0.2)

## 2019-11-03 LAB — MAGNESIUM: Magnesium: 1.9 mg/dL (ref 1.7–2.4)

## 2019-11-03 MED ORDER — POTASSIUM CHLORIDE CRYS ER 20 MEQ PO TBCR
40.0000 meq | EXTENDED_RELEASE_TABLET | Freq: Once | ORAL | Status: AC
  Administered 2019-11-03: 40 meq via ORAL
  Filled 2019-11-03: qty 2

## 2019-11-03 MED ORDER — ALBUTEROL SULFATE (2.5 MG/3ML) 0.083% IN NEBU: 2.5000 mg | INHALATION_SOLUTION | Freq: Four times a day (QID) | RESPIRATORY_TRACT | Status: DC | PRN

## 2019-11-03 MED ORDER — FUROSEMIDE 10 MG/ML IJ SOLN
40.0000 mg | Freq: Two times a day (BID) | INTRAMUSCULAR | Status: DC
  Administered 2019-11-03: 40 mg via INTRAVENOUS
  Filled 2019-11-03: qty 4

## 2019-11-03 MED ORDER — FUROSEMIDE 10 MG/ML IJ SOLN
60.0000 mg | Freq: Two times a day (BID) | INTRAMUSCULAR | Status: DC
  Administered 2019-11-03 – 2019-11-04 (×2): 60 mg via INTRAVENOUS
  Filled 2019-11-03 (×2): qty 6

## 2019-11-03 NOTE — Progress Notes (Signed)
Daisy [name]  TKZ:[id number] DOB: 02-Jun-1951 DOA: 11/02/2019 PCP: Alvester Chou, NP    Brief Narrative:  69 year old with a history of chronic hypoxia requiring 2 L nasal cannula oxygen support, OSA on nocturnal BiPAP, diastolic CHF, CKD stage III, GERD, PAD, and chronic osteoarthritis who was sent back to [facility] due to lack of a BiPAP machine at said facility.  Patient herself had no complaints with preserved O2 saturations on her usual oxygen support.  Patient had been admitted at [facility] 5/3-5/19 for treatment of hypoxic respiratory failure.  During that stay she was diuresed and seen by pulmonary who recommended 2 L nasal cannula oxygen support during the day and BiPAP nightly.  She was also treated for UTI at that time with Rocephin/Keflex.  Significant Events: 5/3-5/19 prior acute hospitalization for hypoxic respiratory failure 5/19 returned to [facility] ED from [facility] SNF due to lack of BiPAP  Antimicrobials:  None  Subjective: Resting comfortably in a bedside chair.  Reports worsening pedal edema today.  Denies chest pain shortness of breath nausea vomiting abdominal pain.  Is very frustrated with the events of yesterday, understandably.  Assessment & Plan:  Chronic hypoxic respiratory failure - OSA on BiPAP Clinically stable -continue nightly BiPAP  Chronic diastolic CHF Does not tolerate Entresto -continue beta-blocker -increase diuretic dosing today given pedal edema  CKD stage IIIb Baseline creatinine 1.3 -monitor intermittently with use of diuretic  GERD Continue PPI  PAD Continue aspirin and Pletal  Chronic osteoarthritis of the knee Continue usual pain regimen  History of breast cancer Continue anastrozole  DVT prophylaxis: Lovenox Code Status: FULL CODE Family Communication: Spoke with patient and family at bedside Status is: Observation  The patient remains OBS appropriate and will d/c before 2  midnights.  Dispo: The patient is from: SNF              Anticipated d/c is to: SNF              Anticipated d/c date is: 1 day              Patient currently is medically stable to d/c.  Consultants:  none  Objective: Blood pressure 111/64, pulse 89, temperature 98.4 F (36.9 C), temperature source Oral, resp. rate (!) 21, height 5\' 6"  (1.676 m), weight (!) 149.9 kg, SpO2 94 %. No intake or output data in the 24 hours ending 11/03/19 0943 Filed Weights   11/02/19 2147 11/03/19 0605  Weight: (!) 149.2 kg (!) 149.9 kg    Examination: General: No acute respiratory distress Lungs: Clear to auscultation bilaterally without wheezes or crackles Cardiovascular: Regular rate and rhythm without murmur gallop or rub normal S1 and S2 Abdomen: Nontender, nondistended, soft, bowel sounds positive, no rebound, no ascites, no appreciable mass Extremities: 1+ bilateral pedal edema to ankles  CBC: Recent Labs  Lab 11/02/19 2330 11/03/19 0558  WBC 6.5 5.5  HGB 13.4 12.8  HCT 44.3 42.8  MCV 100.0 100.0  PLT 222 573   Basic Metabolic Panel: Recent Labs  Lab 11/01/19 0243 11/01/19 0243 11/02/19 1000 11/02/19 2330 11/03/19 0558  NA 142  --   --  142 140  K 3.4*   < > 3.8 4.0 3.9  CL 98  --   --  99 99  CO2 33*  --   --  33* 29  GLUCOSE 113*  --   --  111* 103*  BUN 21  --   --  17  17  CREATININE 1.32*  --   --  1.46* 1.24*  CALCIUM 9.4  --   --  9.1 8.9  MG  --   --   --   --  1.9   < > = values in this interval not displayed.   GFR: Estimated Creatinine Clearance: 64.6 mL/min (A) (by C-G formula based on SCr of 1.24 mg/dL (H)).  Liver Function Tests: Recent Labs  Lab 11/02/19 2330 11/03/19 0558  AST 24 23  ALT 24 24  ALKPHOS 73 73  BILITOT 0.5 0.6  PROT 6.5 6.2*  ALBUMIN 3.1* 3.0*    CBG: Recent Labs  Lab 11/03/19 0145 11/03/19 0758  GLUCAP 99 100*     Scheduled Meds: . anastrozole  1 mg Oral Daily  . aspirin  81 mg Oral Daily  . cilostazol  100 mg Oral  BID  . enoxaparin (LOVENOX) injection  60 mg Subcutaneous QHS  . furosemide  40 mg Intravenous BID  . insulin aspart  0-6 Units Subcutaneous TID WC  . metoprolol succinate  12.5 mg Oral Daily  . multivitamin  1 tablet Oral Daily  . pantoprazole  20 mg Oral Daily     LOS: 0 days   Cherene Altes, MD [facility] Office  [phone] Pager - Text Page per Amion  If 7PM-7AM, please contact night-coverage per Amion 11/03/2019, 9:43 AM

## 2019-11-03 NOTE — Progress Notes (Signed)
Lovenox per Pharmacy for DVT Prophylaxis    Pharmacy has been consulted from dosing enoxaparin (lovenox) in this patient for DVT prophylaxis.  The pharmacist has reviewed pertinent labs (Hgb _13.4__; PLT__45.2_), patient weight (__149_kg) and renal function (CrCl__60_mL/min) and decided that enoxaparin _60_mg SQ Q24Hrs is appropriate for this patient.  The pharmacy department will sign off at this time.  Please reconsult pharmacy if status changes or for further issues.  Thank you  Cyndia Diver PharmD, BCPS  11/03/2019, 12:09 AM

## 2019-11-03 NOTE — Progress Notes (Signed)
   Vital Signs @MEWSNOTE @  RN reassessed patient at 2040. Patient is alert and oriented x 4. Vital signs was rechecked and documented by NT.     Trellis Guirguis M Haden Cavenaugh 11/03/2019,8:41 PM

## 2019-11-03 NOTE — Progress Notes (Signed)
Pt arrived to unit via ed stretcher and nasal cannula applied at 2L.Pt ambulated with assistance of walker to bed in room. Pt did not appear to be in distress vss cpap applied. Will continue to monitor.

## 2019-11-03 NOTE — Progress Notes (Addendum)
TOC CM spoke to [facility] Admission Rep, Janie. They have her Bipap and are will to accept pt back. Will update MD and family. Alesia Shavis RN CCM, [facility] ED TOC CM [phone]  1130 am TOC met with pt and dtr, Jennifer in room. Dtr states she would like [facility]. She has spoke to Admissions and they do have available beds. States she wants to ensure her medications, oxygen, Bipap, bedside commode and RW are at the facility prior to her dc. Updated given to TOC CSW following pt and attending MD. Explained clinicals will need to be sent with FL2 and pt/dtr gave permission to create FL2 and fax to new facility. Notified [facility] of plan. Alesia Shavis RN CCM, [facility] ED TOC CM [phone] 

## 2019-11-03 NOTE — TOC Progression Note (Addendum)
Transition of Care [facility]) - Progression Note    Patient Details  Name: Kalenna Millett MRN: [id number] Date of Birth: 1951-02-02  [facility] CM/SW Contact  Servando Snare, [place] Phone Number: 11/03/2019, 11:56 AM  Clinical Narrative:   Patient family requesting new facility. LCSW faxed pt clinicals to facility and called admissions coordinator. Patient under review. LCSW followed up with [employer]. Patient will need new authorization once bed is confirmed.   4:03 PM LCSW followed up with [employer] and was unsuccessful with reaching admissions.    TOC will continue to follow.     Expected Discharge Plan and Services                                                 Social Determinants of Health (SDOH) Interventions    Readmission Risk Interventions No flowsheet data found.

## 2019-11-03 NOTE — Progress Notes (Signed)
Pt. placed on BiPAP DS([facility])once arrived to floor with settings of I=16/E-8 with 2 lpm Oxygen added to circuit, order placed on 11/02/2019 for BiPAP placement@2200  on 11/03/2019, for "Best Practice" BiPAP placed on @ this time to better facilitate pt. transfer/placement to pending facility without further delay.

## 2019-11-03 NOTE — Telephone Encounter (Signed)
Patient daughter called stating that patient cannot be in to see her PCP because she is not seeing patients in clinic and is asking if you can fill out FMLA paperwork for her to help get patient setup in nursing home. She is requesting just a week off to assist and setup everything for the patient. She stated that patient is currently admitted at [facility], so cannot come in here right now either. Will patient need to be seen with you or another provider here at [facility] to get paperwork filled out? Please advise.

## 2019-11-04 ENCOUNTER — Institutional Professional Consult (permissible substitution): Payer: Medicare PPO | Admitting: Internal Medicine

## 2019-11-04 ENCOUNTER — Telehealth: Payer: Self-pay | Admitting: Oncology

## 2019-11-04 DIAGNOSIS — N1832 Chronic kidney disease, stage 3b: Secondary | ICD-10-CM | POA: Diagnosis present

## 2019-11-04 DIAGNOSIS — J9612 Chronic respiratory failure with hypercapnia: Secondary | ICD-10-CM | POA: Diagnosis present

## 2019-11-04 DIAGNOSIS — I739 Peripheral vascular disease, unspecified: Secondary | ICD-10-CM | POA: Diagnosis present

## 2019-11-04 DIAGNOSIS — Z90710 Acquired absence of both cervix and uterus: Secondary | ICD-10-CM | POA: Diagnosis not present

## 2019-11-04 DIAGNOSIS — J9622 Acute and chronic respiratory failure with hypercapnia: Secondary | ICD-10-CM

## 2019-11-04 DIAGNOSIS — J984 Other disorders of lung: Secondary | ICD-10-CM | POA: Diagnosis present

## 2019-11-04 DIAGNOSIS — J9610 Chronic respiratory failure, unspecified whether with hypoxia or hypercapnia: Secondary | ICD-10-CM

## 2019-11-04 DIAGNOSIS — Z170 Estrogen receptor positive status [ER+]: Secondary | ICD-10-CM | POA: Diagnosis not present

## 2019-11-04 DIAGNOSIS — F17290 Nicotine dependence, other tobacco product, uncomplicated: Secondary | ICD-10-CM | POA: Diagnosis present

## 2019-11-04 DIAGNOSIS — R0603 Acute respiratory distress: Secondary | ICD-10-CM | POA: Diagnosis not present

## 2019-11-04 DIAGNOSIS — E662 Morbid (severe) obesity with alveolar hypoventilation: Secondary | ICD-10-CM | POA: Diagnosis present

## 2019-11-04 DIAGNOSIS — Z6841 Body Mass Index (BMI) 40.0 and over, adult: Secondary | ICD-10-CM | POA: Diagnosis not present

## 2019-11-04 DIAGNOSIS — Z20822 Contact with and (suspected) exposure to covid-19: Secondary | ICD-10-CM | POA: Diagnosis present

## 2019-11-04 DIAGNOSIS — J9611 Chronic respiratory failure with hypoxia: Secondary | ICD-10-CM | POA: Diagnosis present

## 2019-11-04 DIAGNOSIS — R0602 Shortness of breath: Secondary | ICD-10-CM | POA: Diagnosis present

## 2019-11-04 DIAGNOSIS — I5032 Chronic diastolic (congestive) heart failure: Secondary | ICD-10-CM | POA: Diagnosis present

## 2019-11-04 DIAGNOSIS — Z79811 Long term (current) use of aromatase inhibitors: Secondary | ICD-10-CM | POA: Diagnosis not present

## 2019-11-04 DIAGNOSIS — Z853 Personal history of malignant neoplasm of breast: Secondary | ICD-10-CM | POA: Diagnosis not present

## 2019-11-04 DIAGNOSIS — K219 Gastro-esophageal reflux disease without esophagitis: Secondary | ICD-10-CM | POA: Diagnosis present

## 2019-11-04 DIAGNOSIS — E785 Hyperlipidemia, unspecified: Secondary | ICD-10-CM | POA: Diagnosis present

## 2019-11-04 DIAGNOSIS — Z923 Personal history of irradiation: Secondary | ICD-10-CM | POA: Diagnosis not present

## 2019-11-04 DIAGNOSIS — I130 Hypertensive heart and chronic kidney disease with heart failure and stage 1 through stage 4 chronic kidney disease, or unspecified chronic kidney disease: Secondary | ICD-10-CM | POA: Diagnosis present

## 2019-11-04 DIAGNOSIS — C50411 Malignant neoplasm of upper-outer quadrant of right female breast: Secondary | ICD-10-CM | POA: Diagnosis not present

## 2019-11-04 LAB — BASIC METABOLIC PANEL
Anion gap: 10 (ref 5–15)
BUN: 16 mg/dL (ref 8–23)
CO2: 33 mmol/L — ABNORMAL HIGH (ref 22–32)
Calcium: 9.1 mg/dL (ref 8.9–10.3)
Chloride: 96 mmol/L — ABNORMAL LOW (ref 98–111)
Creatinine, Ser: 1.41 mg/dL — ABNORMAL HIGH (ref 0.44–1.00)
GFR calc Af Amer: 44 mL/min — ABNORMAL LOW (ref >60)
GFR calc non Af Amer: 38 mL/min — ABNORMAL LOW (ref >60)
Glucose, Bld: 119 mg/dL — ABNORMAL HIGH (ref 70–99)
Potassium: 4 mmol/L (ref 3.5–5.1)
Sodium: 139 mmol/L (ref 135–145)

## 2019-11-04 LAB — HEMOGLOBIN A1C
Hgb A1c MFr Bld: 6.3 % — ABNORMAL HIGH (ref 4.8–5.6)
Mean Plasma Glucose: 134 mg/dL

## 2019-11-04 LAB — MAGNESIUM: Magnesium: 1.9 mg/dL (ref 1.7–2.4)

## 2019-11-04 LAB — GLUCOSE, CAPILLARY: Glucose-Capillary: 110 mg/dL — ABNORMAL HIGH (ref 70–99)

## 2019-11-04 LAB — SARS CORONAVIRUS 2 (TAT 6-24 HRS): SARS Coronavirus 2: NEGATIVE

## 2019-11-04 MED ORDER — FUROSEMIDE 10 MG/ML IJ SOLN
80.0000 mg | Freq: Two times a day (BID) | INTRAMUSCULAR | Status: DC
  Administered 2019-11-04 – 2019-11-06 (×5): 80 mg via INTRAVENOUS
  Filled 2019-11-04 (×5): qty 8

## 2019-11-04 NOTE — Plan of Care (Signed)

## 2019-11-04 NOTE — TOC Progression Note (Addendum)
Transition of Care [facility]) - Progression Note    Patient Details  Name: Daisy [name] MRN: [id number] Date of Birth: 1951-03-26  Transition of Care [facility]) CM/SW Contact  Servando Snare, [place] Phone Number: 11/04/2019, 3:17 PM  Clinical Narrative:   Patient will need to go home with a Trilogy. LCSW contacted Zach with [employer] to start the process for the equipment.   4:18 PM Sent signed order for trilogy to [facility]. Hospital beds out of stock.        Expected Discharge Plan and Services                                                 Social Determinants of Health (SDOH) Interventions    Readmission Risk Interventions No flowsheet data found.

## 2019-11-04 NOTE — Progress Notes (Signed)
Daisy [name]  WER:[id number] DOB: 1950/11/15 DOA: 11/02/2019 PCP: Alvester Chou, NP    Brief Narrative:  69 year old with a history of chronic hypoxia requiring 2 L nasal cannula oxygen support, OSA on nocturnal BiPAP, diastolic CHF, CKD stage III, GERD, PAD, and chronic osteoarthritis who was sent back to [facility] SNF due to lack of a BiPAP machine at said facility.  Patient herself had no complaints with preserved O2 saturations on her usual oxygen support.  Patient had been admitted at [facility] 5/3-5/19 for treatment of hypoxic respiratory failure.  During that stay she was diuresed and seen by pulmonary who recommended 2 L nasal cannula oxygen support during the day and BiPAP nightly.  She was also treated for UTI at that time with Rocephin/Keflex.  Significant Events: 5/3-5/19 prior acute hospitalization for hypoxic respiratory failure 5/19 returned to [facility] ED from [facility] SNF due to lack of BiPAP  Antimicrobials:  None  Subjective: Tolerating nightly dream machine support thus far. Continues to note increased swelling in her feet and ankles. No chest pain or abdom pain.   Pt has now decided to d/c home instead of rehab facility. Family preparing to care for patient at home. Pt can not be safely d/c home until she has a Trilogy device arranged for home use, as she will rapidly and severely decompensate without consistent use of resp support.   Assessment & Plan:  Chronic hypoxic respiratory failure - OSA on BiPAP Clinically stable - continue nightly ventilatory support (16/8, 2 LPM bleed in O2)  Chronic diastolic CHF Does not tolerate Entresto - continue beta-blocker -increase diuretic dosing again today given pedal edema and continued positive fluid balance   CKD stage IIIb Baseline creatinine 1.3 -monitor intermittently with use of diuretic  GERD Continue PPI  PAD Continue aspirin and Pletal  Chronic osteoarthritis of the  knee Continue usual pain regimen  History of breast cancer Continue anastrozole  DVT prophylaxis: Lovenox Code Status: FULL CODE Family Communication: Spoke with family at via telephone  Status is: Inpatient  Remains inpatient appropriate because:Unsafe d/c plan   Dispo: The patient is from: Home              Anticipated d/c is to: Home              Anticipated d/c date is: 1 day              Patient currently is not medically stable to d/c.   Consultants:  none  Objective: Blood pressure 121/60, pulse 100, temperature 98.3 F (36.8 C), temperature source Oral, resp. rate 18, height 5\' 6"  (1.676 m), weight (!) 146.3 kg, SpO2 95 %.  Intake/Output Summary (Last 24 hours) at 11/04/2019 0950 Last data filed at 11/04/2019 0406 Gross per 24 hour  Intake 838 ml  Output --  Net 838 ml   Filed Weights   11/02/19 2147 11/03/19 0605 11/04/19 0500  Weight: (!) 149.2 kg (!) 149.9 kg (!) 146.3 kg    Examination: General: No acute respiratory distress Lungs: distant breath sounds th/o - no wheezing  Cardiovascular: distant HS - no M or rub  Abdomen: Nontender, overweight, soft, bowel sounds positive, no rebound, no ascites, no appreciable mass Extremities: 1+ bilateral pedal edema to ankles w/o change   CBC: Recent Labs  Lab 11/02/19 2330 11/03/19 0558  WBC 6.5 5.5  HGB 13.4 12.8  HCT 44.3 42.8  MCV 100.0 100.0  PLT 222 195   Basic Metabolic Panel:  Recent Labs  Lab 11/02/19 2330 11/03/19 0558 11/04/19 0559  NA 142 140 139  K 4.0 3.9 4.0  CL 99 99 96*  CO2 33* 29 33*  GLUCOSE 111* 103* 119*  BUN 17 17 16   CREATININE 1.46* 1.24* 1.41*  CALCIUM 9.1 8.9 9.1  MG  --  1.9 1.9   GFR: Estimated Creatinine Clearance: 55.9 mL/min (A) (by C-G formula based on SCr of 1.41 mg/dL (H)).  Liver Function Tests: Recent Labs  Lab 11/02/19 2330 11/03/19 0558  AST 24 23  ALT 24 24  ALKPHOS 73 73  BILITOT 0.5 0.6  PROT 6.5 6.2*  ALBUMIN 3.1* 3.0*    CBG: Recent Labs   Lab 11/03/19 0758 11/03/19 1232 11/03/19 1656 11/03/19 2114 11/04/19 0736  GLUCAP 100* 100* 94 120* 110*     Scheduled Meds: . anastrozole  1 mg Oral Daily  . aspirin  81 mg Oral Daily  . cilostazol  100 mg Oral BID  . enoxaparin (LOVENOX) injection  60 mg Subcutaneous QHS  . furosemide  60 mg Intravenous BID  . metoprolol succinate  12.5 mg Oral Daily  . multivitamin  1 tablet Oral Daily  . pantoprazole  20 mg Oral Daily     LOS: 0 days   Cherene Altes, MD [facility] Office  [phone] Pager - Text Page per Amion  If 7PM-7AM, please contact night-coverage per Amion 11/04/2019, 9:50 AM

## 2019-11-04 NOTE — Progress Notes (Signed)
Daisy [name]   DOB:1951-01-17   YI#:[id number]   OLM#:[id number]  Subjective:  Daisy [name] tells me she was discharged to [facility] but when she got there there were no medications, no CPAP equipment and basically she was brought right back to the hospital and admitted for observation.  She is getting everything lined up to go home.  She tells me her daughter is going to be with her for at least 2 weeks and that she is hoping to get her physical therapy done at the house.  She has continued on anastrozole through all these admissions and continues to tolerate it well.   Objective: White woman examined in bed Vitals:   11/03/19 2236 11/04/19 0443  BP:  121/60  Pulse: 86 100  Resp: 18   Temp:  98.3 F (36.8 C)  SpO2: 97% 95%    Body mass index is 52.06 kg/m.  Intake/Output Summary (Last 24 hours) at 11/04/2019 1410 Last data filed at 11/04/2019 0951 Gross per 24 hour  Intake 718 ml  Output --  Net 718 ml     CBG (last 3)  Recent Labs    11/03/19 1656 11/03/19 2114 11/04/19 0736  GLUCAP 94 120* 110*     Labs:  Lab Results  Component Value Date   WBC 5.5 11/03/2019   HGB 12.8 11/03/2019   HCT 42.8 11/03/2019   MCV 100.0 11/03/2019   PLT 213 11/03/2019   NEUTROABS 4.7 10/26/2019    '@LASTCHEMISTRY' @  Urine Studies No results for input(s): UHGB, CRYS in the last 72 hours.  Invalid input(s): UACOL, UAPR, USPG, UPH, UTP, UGL, UKET, UBIL, UNIT, UROB, ULEU, UEPI, UWBC, URBC, UBAC, CAST, [place], [place]  Basic Metabolic Panel: Recent Labs  Lab 10/30/19 0324 10/30/19 0324 11/01/19 0243 11/02/19 1000 11/02/19 2330 11/02/19 2330 11/03/19 0558 11/04/19 0559  NA 140  --  142  --  142  --  140 139  K 4.0   < > 3.4*   < > 4.0   < > 3.9 4.0  CL 99  --  98  --  99  --  99 96*  CO2 33*  --  33*  --  33*  --  29 33*  GLUCOSE 112*  --  113*  --  111*  --  103* 119*  BUN 21  --  21  --  17  --  17 16  CREATININE 1.33*  --  1.32*  --  1.46*  --  1.24* 1.41*  CALCIUM 9.1  --   9.4  --  9.1  --  8.9 9.1  MG  --   --   --   --   --   --  1.9 1.9   < > = values in this interval not displayed.   GFR Estimated Creatinine Clearance: 55.9 mL/min (A) (by C-G formula based on SCr of 1.41 mg/dL (H)). Liver Function Tests: Recent Labs  Lab 11/02/19 2330 11/03/19 0558  AST 24 23  ALT 24 24  ALKPHOS 73 73  BILITOT 0.5 0.6  PROT 6.5 6.2*  ALBUMIN 3.1* 3.0*   No results for input(s): LIPASE, AMYLASE in the last 168 hours. No results for input(s): AMMONIA in the last 168 hours. Coagulation profile No results for input(s): INR, PROTIME in the last 168 hours.  CBC: Recent Labs  Lab 11/02/19 2330 11/03/19 0558  WBC 6.5 5.5  HGB 13.4 12.8  HCT 44.3 42.8  MCV 100.0 100.0  PLT 222 213   Cardiac  Enzymes: No results for input(s): CKTOTAL, CKMB, CKMBINDEX, TROPONINI in the last 168 hours. BNP: Invalid input(s): POCBNP CBG: Recent Labs  Lab 11/03/19 0758 11/03/19 1232 11/03/19 1656 11/03/19 2114 11/04/19 0736  GLUCAP 100* 100* 94 120* 110*   D-Dimer No results for input(s): DDIMER in the last 72 hours. Hgb A1c Recent Labs    11/02/19 2330  HGBA1C 6.3*   Lipid Profile No results for input(s): CHOL, HDL, LDLCALC, TRIG, CHOLHDL, LDLDIRECT in the last 72 hours. Thyroid function studies No results for input(s): TSH, T4TOTAL, T3FREE, THYROIDAB in the last 72 hours.  Invalid input(s): FREET3 Anemia work up No results for input(s): VITAMINB12, FOLATE, FERRITIN, TIBC, IRON, RETICCTPCT in the last 72 hours. Microbiology No results found for this or any previous visit (from the past 240 hour(s)).    Studies:  No results found.  Assessment: 69 y.o.   69 y.o. Fernand Parkins, [place] woman Status post left breast upper outer quadrant biopsy 09/24/2016 of an area of architectural distortion measuring 3.7 cm, showing invasive ductal carcinoma, grade 1, estrogen and progesterone receptor positive, HER-2 nonamplified, with an MIB-1 of 5%.             (a) a 0.3 cm  area of linear calcifications in the superior aspect of the breast Was benign on biopsy 10/02/2016   (1) left lumpectomy and sentinel lymph node sampling 11/19/2016 found ([id number] [id number]) invasive ductal carcinoma, measuring 1.9 cm, grade 2, with all 3 sentinel lymph nodes clear. However the medial margin was involved             (a) additional surgery 12/12/2016 cleared the involved margin (as CA [id number]  (2) Oncotype DX  score of 14 predicted a 10 year risk of outside the breast recurrence of 9% if the patient's only systemic therapy is anti-estrogens for 5 years. It also predicted no benefit from chemotherapy.  (3) adjuvant radiation 01/20/2017 to 03/02/2017 Site/dose:The Left breast was treated to 50.4 Gy in 18 fractions of 1.8 Gy.   (4) anastrozole started April 2018  Plan:  Daisy [name] does report on the current status at [facility] it is very surprising as in my past experience they have provided very good supportive services to patients.  In any case she is now feeling much more competent to care for herself, her daughter is planning to be home with her while she does home PT and she has an appointment with me in mid June but we are going to move that back to September or October.  I greatly appreciate your help to this patient!   Chauncey Cruel, MD 11/04/2019  2:10 PM Medical Oncology and Hematology [facility] [address] [place], [place] [address] Tel. [phone]    Fax. [phone]

## 2019-11-04 NOTE — Progress Notes (Addendum)
   Statement regarding the necessity of a Trilogy device for this patient:  Suspect long-standing untreated obstructive sleep apnea. She continues to exhibit signs of hypercapnia associated with her chronic respiratory failure due to restrictive pulmonary disease and hypoventilation.  She requires use of noninvasive ventilation both at hour of sleep as well as needed during the day when napping.  The use of noninvasive ventilation will treat the patient's high PCO2 level and therefore reduce her risk of exacerbations and further hospitalization when used at night and as needed during the day.  Simple BiPAP is not an option due to functional limitations and the severity of her condition.  Failure to not have noninvasive ventilation for use over 24-hour could lead to death, and would most certainly lead to the need for acute emergent hospitalization. She has been evaluated by [facility] who agrees.  Patient continues to exhibit signs of hypercapnia associated with morbid obesity that is causing thoracic restriction. Interruption or failure to provide NIV would quickly lead to exacerbation of the patient's condition, hospital admission, and likely harm to the patient. Continued use is preferred. The use of the NIV will treat patient's high PC02 levels and can reduce risk of exacerbations and future hospitalizations when used at night and during the day. BiLevel/RAD Therapy with and without a rate would be ineffective as patient requires a volume targeted mode. Ventilation is required to decrease the work of breathing and improve pulmonary status. Interruption of ventilator support would lead to decline of health status. Patient is able to protect their airways and clear secretions on their own. The head of her bed needs to be elevated at 30 degrees or greater at all times.   Cherene Altes, MD [facility] Office  [phone]  11/04/2019, 3:24 PM

## 2019-11-04 NOTE — TOC Progression Note (Addendum)
Transition of Care [facility]) - Progression Note    Patient Details  Name: Quanita Barona MRN: [id number] Date of Birth: 02/26/1951  [facility] CM/SW Contact  Servando Snare, [place] Phone Number: 11/04/2019, 10:45 AM  Clinical Narrative:  Patient declined by facility. Family requested [facility] or [facility]. LCSW faxed patient info and reached out to facility admissions. [facility]- No beds. Awaiting response from [facility].   Family and patient agreeable to home with home heath if no bed available. Patient will need DME orders if home with [facility] is the pan. Patient and family requesting hospital bed.    11:51 AM Patient and family prefer to go home with home health LCSW notified attending via secure chat.       Expected Discharge Plan and Services                                                 Social Determinants of Health (SDOH) Interventions    Readmission Risk Interventions No flowsheet data found.

## 2019-11-04 NOTE — Telephone Encounter (Signed)
Scheduled appt per 5/21 sch message - left message for patient with appt date and time

## 2019-11-05 LAB — BASIC METABOLIC PANEL
Anion gap: 13 (ref 5–15)
BUN: 17 mg/dL (ref 8–23)
CO2: 33 mmol/L — ABNORMAL HIGH (ref 22–32)
Calcium: 9.3 mg/dL (ref 8.9–10.3)
Chloride: 94 mmol/L — ABNORMAL LOW (ref 98–111)
Creatinine, Ser: 1.31 mg/dL — ABNORMAL HIGH (ref 0.44–1.00)
GFR calc Af Amer: 48 mL/min — ABNORMAL LOW (ref >60)
GFR calc non Af Amer: 41 mL/min — ABNORMAL LOW (ref >60)
Glucose, Bld: 104 mg/dL — ABNORMAL HIGH (ref 70–99)
Potassium: 3.1 mmol/L — ABNORMAL LOW (ref 3.5–5.1)
Sodium: 140 mmol/L (ref 135–145)

## 2019-11-05 MED ORDER — POTASSIUM CHLORIDE CRYS ER 20 MEQ PO TBCR
40.0000 meq | EXTENDED_RELEASE_TABLET | Freq: Three times a day (TID) | ORAL | Status: DC
  Administered 2019-11-05 – 2019-11-07 (×5): 40 meq via ORAL
  Filled 2019-11-05 (×5): qty 2

## 2019-11-05 MED ORDER — POTASSIUM CHLORIDE CRYS ER 20 MEQ PO TBCR
40.0000 meq | EXTENDED_RELEASE_TABLET | Freq: Two times a day (BID) | ORAL | Status: DC
  Administered 2019-11-05: 40 meq via ORAL
  Filled 2019-11-05: qty 2

## 2019-11-05 NOTE — Plan of Care (Signed)
  Problem: Clinical Measurements: Goal: Cardiovascular complication will be avoided Outcome: Progressing   Problem: Clinical Measurements: Goal: Respiratory complications will improve Outcome: Progressing   Problem: Clinical Measurements: Goal: Diagnostic test results will improve Outcome: Progressing   Problem: Clinical Measurements: Goal: Will remain free from infection Outcome: Progressing   

## 2019-11-05 NOTE — Telephone Encounter (Signed)
I am happy to do it

## 2019-11-05 NOTE — Progress Notes (Signed)
Daisy [name]  IRJ:[id number] DOB: 10/18/1950 DOA: 11/02/2019 PCP: Alvester Chou, NP    Brief Narrative:  69 year old with a history of chronic hypoxia requiring 2 L nasal cannula oxygen support, OSA on nocturnal BiPAP, diastolic CHF, CKD stage III, GERD, PAD, and chronic osteoarthritis who was sent back [facility] due to lack of a BiPAP machine at said facility.  Patient herself had no complaints with preserved O2 saturations on her usual oxygen support.  Patient had been admitted at [facility] 5/3-5/19 for treatment of hypoxic respiratory failure.  During that stay she was diuresed and seen by pulmonary who recommended 2 L nasal cannula oxygen support during the day and BiPAP nightly.  She was also treated for UTI at that time with Rocephin/Keflex.  Significant Events: 5/3-5/19 prior acute hospitalization for hypoxic respiratory failure 5/19 returned to [facility] ED from [facility]F due to lack of BiPAP  Antimicrobials:  None  Subjective: Resting comfortably in a bedside chair.  Her trilogy device has been delivered to the room and she is going to give it a trial run tonight.  She denies chest pain nausea vomiting or abdominal pain.  She has persistent swelling in her feet and legs but this appears to be slightly improved today.  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  Clinically stable - continue nightly ventilatory support (16/8, 2 LPM bleed in O2) -trial of trilogy device tonight  Chronic diastolic CHF Does not tolerate Entresto - continue beta-blocker -increased diuretic dose given pedal edema and continued positive fluid balance   CKD stage IIIb Baseline creatinine 1.3 -monitor intermittently with use of diuretic  GERD Continue PPI  PAD Continue aspirin and Pletal  Chronic osteoarthritis of the knee Continue usual pain regimen  History  of breast cancer Continue anastrozole  Morbid Obesity - Body mass index is 52.06 kg/m.   DVT prophylaxis: Lovenox Code Status: FULL CODE Family Communication: Spoke with daughter at bedside Status is: Inpatient  Remains inpatient appropriate because:Unsafe d/c plan   Dispo: The patient is from: Home              Anticipated d/c is to: Home              Anticipated d/c date is: 1 day              Patient currently is not medically stable to d/c.   Consultants:  none  Objective: Blood pressure 101/60, pulse 94, temperature 98.1 F (36.7 C), temperature source Oral, resp. rate 18, height 5\' 6"  (1.676 m), weight (!) 146.3 kg, SpO2 96 %.  Intake/Output Summary (Last 24 hours) at 11/05/2019 0842 Last data filed at 11/05/2019 0500 Gross per 24 hour  Intake 960 ml  Output -  Net 960 ml   Filed Weights   11/02/19 2147 11/03/19 0605 11/04/19 0500  Weight: (!) 149.2 kg (!) 149.9 kg (!) 146.3 kg    Examination: General: No acute respiratory distress -alert and conversant Lungs: distant breath sounds th/o Cardiovascular: distant HS - no M or rub appreciable Abdomen: Obese, soft, bowel sounds positive Extremities: 1+ bilateral pedal edema to ankles which is stable wound   CBC: Recent Labs  Lab 11/02/19 2330 11/03/19 0558  WBC 6.5 5.5  HGB 13.4 12.8  HCT 44.3 42.8  MCV 100.0 100.0  PLT 222 301   Basic Metabolic Panel: Recent Labs  Lab  11/03/19 0558 11/04/19 0559 11/05/19 0530  NA 140 139 140  K 3.9 4.0 3.1*  CL 99 96* 94*  CO2 29 33* 33*  GLUCOSE 103* 119* 104*  BUN 17 16 17   CREATININE 1.24* 1.41* 1.31*  CALCIUM 8.9 9.1 9.3  MG 1.9 1.9  --    GFR: Estimated Creatinine Clearance: 60.2 mL/min (A) (by C-G formula based on SCr of 1.31 mg/dL (H)).  Liver Function Tests: Recent Labs  Lab 11/02/19 2330 11/03/19 0558  AST 24 23  ALT 24 24  ALKPHOS 73 73  BILITOT 0.5 0.6  PROT 6.5 6.2*  ALBUMIN 3.1* 3.0*    CBG: Recent Labs  Lab 11/03/19 0758  11/03/19 1232 11/03/19 1656 11/03/19 2114 11/04/19 0736  GLUCAP 100* 100* 94 120* 110*     Scheduled Meds: . anastrozole  1 mg Oral Daily  . aspirin  81 mg Oral Daily  . cilostazol  100 mg Oral BID  . enoxaparin (LOVENOX) injection  60 mg Subcutaneous QHS  . furosemide  80 mg Intravenous BID  . metoprolol succinate  12.5 mg Oral Daily  . multivitamin  1 tablet Oral Daily  . pantoprazole  20 mg Oral Daily  . potassium chloride  40 mEq Oral BID     LOS: 1 day   Cherene Altes, MD [facility] Office  [phone] Pager - Text Page per Amion  If 7PM-7AM, please contact night-coverage per Amion 11/05/2019, 8:42 AM

## 2019-11-06 LAB — BASIC METABOLIC PANEL
Anion gap: 12 (ref 5–15)
BUN: 19 mg/dL (ref 8–23)
CO2: 35 mmol/L — ABNORMAL HIGH (ref 22–32)
Calcium: 9.6 mg/dL (ref 8.9–10.3)
Chloride: 96 mmol/L — ABNORMAL LOW (ref 98–111)
Creatinine, Ser: 1.34 mg/dL — ABNORMAL HIGH (ref 0.44–1.00)
GFR calc Af Amer: 47 mL/min — ABNORMAL LOW (ref >60)
GFR calc non Af Amer: 40 mL/min — ABNORMAL LOW (ref >60)
Glucose, Bld: 106 mg/dL — ABNORMAL HIGH (ref 70–99)
Potassium: 3.4 mmol/L — ABNORMAL LOW (ref 3.5–5.1)
Sodium: 143 mmol/L (ref 135–145)

## 2019-11-06 LAB — MAGNESIUM: Magnesium: 2.1 mg/dL (ref 1.7–2.4)

## 2019-11-06 NOTE — Discharge Summary (Addendum)
DISCHARGE SUMMARY  Daisy [name]  MR#: [id number]  DOB:10-Oct-1950  Date of Admission: 11/02/2019 Date of Discharge: 11/07/2019  Attending Physician:Abriel Hattery Hennie Duos, MD  Patient's YQM:VHQI, Daisy Free, NP   Consults: none  Disposition: D/C home   Follow-up Appts: Follow-up Information    Alvester Chou, NP Follow up in 1 week(s).   Specialty: Nurse Practitioner Contact information: [facility] [place] [address] [phone]           Tests Needing Follow-up: -assess volume status and possible need to titrate diuretic dose -check K+ and renal function w/ ongoing use of diuretic/KDur -assess tolerance of nightly Trilogy support -encourage pt to keep her scheduled sleep study appointment for titration of her Trilogy device   Discharge Diagnoses: Chronic hypoxic and hypercarbic respiratory failure OSA Morbid obesity leading to restrictive lung disease  Chronic diastolic CHF CKD stage IIIb GERD PAD Chronic osteoarthritis of the knee History of breast cancer Morbid Obesity - Body mass index is 52.06 kg/m.  Initial presentation: 69 year old with a history of chronic hypoxia requiring 2 L nasal cannula oxygen support, OSA on nocturnal BiPAP, diastolic CHF, CKD stage III, GERD, PAD, and chronic osteoarthritis who was [name] due to lack of a BiPAP machine at said facility.  Patient herself had no complaints with preserved O2 saturations on her usual oxygen support.  Patient had been admitted at [facility] 5/3-5/19 for treatment of hypoxic respiratory failure.  During that stay she was diuresed and seen by pulmonary who recommended 2 L nasal cannula oxygen support during the day and BiPAP nightly.  She was also treated for UTI at that time with Rocephin/Keflex.  Hospital Course:  Chronic hypoxic and hypercarbic respiratory failure - OSA on BiPAP - Morbid obesity leading to restrictive lung  disease  Clinically stable - continue nightly ventilatory support (16/8, 2 LPM bleed in O2) w/ Trilogy device at home - pt has used this device for the last 2 nights of her hospitalization and tolerated it well   Chronic diastolic CHF Does not tolerate Entresto - continue beta-blocker -increased diuretic dose given pedal edema and continued positive fluid balance during her hospital stay - edema improved - return to her usual home dose at discharge and follow up as outpt   CKD stage IIIb Baseline creatinine 1.3 - monitored intermittently with use of diuretic- renal fxn stable at time of d/c   GERD Continue PPI  PAD Continue aspirin and Pletal  Chronic osteoarthritis of the knee Continue usual pain regimen  History of breast cancer Continue anastrozole  Morbid Obesity - Body mass index is 52.06 kg/m.  Allergies as of 11/07/2019      Reactions   Levaquin [levofloxacin] Other (See Comments)   Body aches   Sulfur Hives      Medication List    TAKE these medications   acetaminophen 325 MG tablet Commonly known as: TYLENOL Take 2 tablets (650 mg total) by mouth every 4 (four) hours as needed for headache or mild pain.   anastrozole 1 MG tablet Commonly known as: ARIMIDEX Take 1 tablet (1 mg total) by mouth daily.   aspirin 81 MG chewable tablet Chew 81 mg by mouth daily.   capsaicin 0.025 % cream Commonly known as: ZOSTRIX Apply topically 2 (two) times daily.   cilostazol 100 MG tablet Commonly known as: PLETAL Take 1 tablet (100 mg total) by mouth 2 (two) times daily.   diclofenac sodium  1 % Gel Commonly known as: VOLTAREN Apply 4 g topically 4 (four) times daily as needed (knee and back pain). Knees and back for pain   Eye Drops Advanced Relief 0.05-0.1-1-1 % Soln Generic drug: Tetrahydroz-Dextran-PEG-Povid Place 1 drop into both eyes 2 (two) times daily.   metoprolol succinate 25 MG 24 hr tablet Commonly known as: TOPROL-XL Take 0.5 tablets (12.5 mg  total) by mouth daily.   pantoprazole 20 MG tablet Commonly known as: PROTONIX Take 20 mg by mouth daily.   polyethylene glycol 17 g packet Commonly known as: MIRALAX / GLYCOLAX Take 17 g by mouth daily as needed for mild constipation.   potassium chloride SA 20 MEQ tablet Commonly known as: KLOR-CON Take 1 tablet (20 mEq total) by mouth daily.   PRESERVISION AREDS 2 PO Take 1 tablet by mouth daily.   ProAir HFA 108 (90 Base) MCG/ACT inhaler Generic drug: albuterol Inhale 2 puffs into the lungs every 6 (six) hours as needed for wheezing or shortness of breath.   torsemide 10 MG tablet Commonly known as: DEMADEX Take 1 tablet (10 mg total) by mouth 2 (two) times daily.            Durable Medical Equipment  (From admission, onward)         Start     Ordered   11/07/19 0720  For home use only DME oxygen  Once    Question Answer Comment  Length of Need Lifetime   Mode or (Route) Nasal cannula   Liters per Minute 2   Frequency Continuous (stationary and portable oxygen unit needed)   Oxygen conserving device Yes   Oxygen delivery system Gas      11/07/19 0719   11/06/19 1408  For home use only DME Hospital bed  Once    Question Answer Comment  Length of Need Lifetime   Patient has (list medical condition): morbid obesity with restrictive lung disease and sleep apnea requiring Trilogy ventilator use - when in bed she must be positioned in a semi-upright position to maintian her airway thus necessiating a hospital bed at home   The above medical condition requires: Patient requires the ability to reposition frequently   Bed type Semi-electric      11/06/19 1408   11/05/19 1544  For home use only DME 4 wheeled rolling walker with seat  Once    Question:  Patient needs a walker to treat with the following condition  Answer:  Gait instability   11/05/19 1543   11/05/19 1544  For home use only DME 3 n 1  Once     11/05/19 1543          Day of Discharge BP 123/60  (BP Location: Right Arm)   Pulse 93   Temp 98.4 F (36.9 C)   Resp (!) 21   Ht 5\' 6"  (1.676 m)   Wt (!) 144.9 kg   SpO2 91%   BMI 51.56 kg/m   Physical Exam: General: No acute respiratory distress Lungs: Clear to auscultation bilaterally without wheezes or crackles Cardiovascular: Regular rate and rhythm without murmur gallop or rub normal S1 and S2 Abdomen: obese, soft, bs+, no mass, no rebound  Extremities: trace B LE edema   Basic Metabolic Panel: Recent Labs  Lab 11/02/19 2330 11/03/19 0558 11/04/19 0559 11/05/19 0530 11/06/19 0546  NA 142 140 139 140 143  K 4.0 3.9 4.0 3.1* 3.4*  CL 99 99 96* 94* 96*  CO2 33* 29 33* 33*  35*  GLUCOSE 111* 103* 119* 104* 106*  BUN 17 17 16 17 19   CREATININE 1.46* 1.24* 1.41* 1.31* 1.34*  CALCIUM 9.1 8.9 9.1 9.3 9.6  MG  --  1.9 1.9  --  2.1    Liver Function Tests: Recent Labs  Lab 11/02/19 2330 11/03/19 0558  AST 24 23  ALT 24 24  ALKPHOS 73 73  BILITOT 0.5 0.6  PROT 6.5 6.2*  ALBUMIN 3.1* 3.0*    CBC: Recent Labs  Lab 11/02/19 2330 11/03/19 0558  WBC 6.5 5.5  HGB 13.4 12.8  HCT 44.3 42.8  MCV 100.0 100.0  PLT 222 213    CBG: Recent Labs  Lab 11/03/19 0758 11/03/19 1232 11/03/19 1656 11/03/19 2114 11/04/19 0736  GLUCAP 100* 100* 94 120* 110*    Recent Results (from the past 240 hour(s))  SARS CORONAVIRUS 2 (TAT 6-24 HRS) Nasopharyngeal Nasopharyngeal Swab     Status: None   Collection Time: 11/04/19 11:16 AM   Specimen: Nasopharyngeal Swab  Result Value Ref Range Status   SARS Coronavirus 2 NEGATIVE NEGATIVE Final    Comment: (NOTE) SARS-CoV-2 target nucleic acids are NOT DETECTED. The SARS-CoV-2 RNA is generally detectable in upper and lower respiratory specimens during the acute phase of infection. Negative results do not preclude SARS-CoV-2 infection, do not rule out co-infections with other pathogens, and should not be used as the sole basis for treatment or other patient management  decisions. Negative results must be combined with clinical observations, patient history, and epidemiological information. The expected result is Negative. Fact Sheet for Patients: SugarRoll.be Fact Sheet for Healthcare Providers: https://www.woods-mathews.com/ This test is not yet approved or cleared by the [place] FDA and  has been authorized for detection and/or diagnosis of SARS-CoV-2 by FDA under an Emergency Use Authorization (EUA). This EUA will remain  in effect (meaning this test can be used) for the duration of the COVID-19 declaration under Section 56 4(b)(1) of the Act, 21 U.S.C. section 360bbb-3(b)(1), unless the authorization is terminated or revoked sooner. Performed at [facility], [place] [address], [place], [place] [address]       Time spent in discharge (includes decision making & examination of pt): 35 minutes  11/07/2019, 7:48 AM   Cherene Altes, MD [facility] Office  [phone]

## 2019-11-06 NOTE — Progress Notes (Signed)
Daisy [name]  UKG:[id number] DOB: [birth year] DOA: 11/02/2019 PCP: Alvester Chou, NP    Brief Narrative:  69 year old with a history of chronic hypoxia requiring 2 L nasal cannula oxygen support, OSA on nocturnal BiPAP, diastolic CHF, CKD stage III, GERD, PAD, and chronic osteoarthritis who was sent back to [facility] from [facility] due to lack of a BiPAP machine at said facility.  Patient herself had no complaints with preserved O2 saturations on her usual oxygen support.  Patient had been admitted at [facility] 5/3-5/19 for treatment of hypoxic respiratory failure.  During that stay she was diuresed and seen by pulmonary who recommended 2 L nasal cannula oxygen support during the day and BiPAP nightly.  She was also treated for UTI at that time with Rocephin/Keflex.  Significant Events: 5/3-5/19 prior acute hospitalization for hypoxic respiratory failure 5/19 returned to [facility] ED from [facility] SNF due to lack of BiPAP  Antimicrobials:  None  Subjective: Tolerated her trilogy device well last night.  Continues to require her usual 2 L nasal cannula oxygen during the daytime.  No new complaints today.  Medically ready for discharge home when a safe environment is able to be assured/all needed medical devices are delivered.  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  Clinically stable - continue nightly ventilatory support with trilogy device  Chronic diastolic CHF Does not tolerate Entresto - continue beta-blocker -increased diuretic dose given pedal edema and continued positive fluid balance -resume usual dose of diuretic at time of discharge  CKD stage IIIb Baseline creatinine 1.3 -monitor intermittently with use of diuretic -stable at this time  GERD Continue PPI  PAD Continue aspirin and Pletal  Chronic osteoarthritis of the knee Continue usual pain  regimen  History of breast cancer Continue anastrozole  Morbid Obesity - Body mass index is 51.24 kg/m.   DVT prophylaxis: Lovenox Code Status: FULL CODE Family Communication:  Status is: Inpatient  Remains inpatient appropriate because:Unsafe d/c plan   Dispo: The patient is from: Home              Anticipated d/c is to: Home              Anticipated d/c date is: 1 day              Patient currently is not medically stable to d/c.   Consultants:  none  Objective: Blood pressure 116/62, pulse 89, temperature (!) 97.5 F (36.4 C), temperature source Oral, resp. rate 18, height 5\' 6"  (1.676 m), weight (!) 144 kg, SpO2 98 %.  Intake/Output Summary (Last 24 hours) at 11/06/2019 1707 Last data filed at 11/06/2019 1452 Gross per 24 hour  Intake 480 ml  Output 4400 ml  Net -3920 ml   Filed Weights   11/03/19 0605 11/04/19 0500 11/06/19 0504  Weight: (!) 149.9 kg (!) 146.3 kg (!) 144 kg    Examination: General: No acute respiratory distress -alert and conversant Lungs: distant breath sounds th/o -no wheezing Cardiovascular: distant HS - no M or rub appreciable Abdomen: Obese, soft, bowel sounds positive Extremities: Trace bilateral lower extremity edema   CBC: Recent Labs  Lab 11/02/19 2330 11/03/19 0558  WBC 6.5 5.5  HGB 13.4 12.8  HCT 44.3 42.8  MCV 100.0 100.0  PLT 222 762   Basic Metabolic Panel: Recent Labs  Lab 11/03/19 0558 11/03/19 0558 11/04/19 0559 11/05/19 0530 11/06/19 0546  NA 140   < > 139 140 143  K 3.9   < > 4.0 3.1* 3.4*  CL 99   < > 96* 94* 96*  CO2 29   < > 33* 33* 35*  GLUCOSE 103*   < > 119* 104* 106*  BUN 17   < > 16 17 19   CREATININE 1.24*   < > 1.41* 1.31* 1.34*  CALCIUM 8.9   < > 9.1 9.3 9.6  MG 1.9  --  1.9  --  2.1   < > = values in this interval not displayed.   GFR: Estimated Creatinine Clearance: 58.3 mL/min (A) (by C-G formula based on SCr of 1.34 mg/dL (H)).  Liver Function Tests: Recent Labs  Lab 11/02/19 2330  11/03/19 0558  AST 24 23  ALT 24 24  ALKPHOS 73 73  BILITOT 0.5 0.6  PROT 6.5 6.2*  ALBUMIN 3.1* 3.0*    CBG: Recent Labs  Lab 11/03/19 0758 11/03/19 1232 11/03/19 1656 11/03/19 2114 11/04/19 0736  GLUCAP 100* 100* 94 120* 110*     Scheduled Meds: . anastrozole  1 mg Oral Daily  . aspirin  81 mg Oral Daily  . cilostazol  100 mg Oral BID  . enoxaparin (LOVENOX) injection  60 mg Subcutaneous QHS  . furosemide  80 mg Intravenous BID  . metoprolol succinate  12.5 mg Oral Daily  . multivitamin  1 tablet Oral Daily  . pantoprazole  20 mg Oral Daily  . potassium chloride  40 mEq Oral TID     LOS: 2 days   Cherene Altes, MD [facility] Office  [phone] Pager - Text Page per Amion  If 7PM-7AM, please contact night-coverage per Amion 11/06/2019, 5:07 PM

## 2019-11-06 NOTE — TOC Progression Note (Signed)
Transition of Care [facility]) - Progression Note    Patient Details  Name: Daisy [name] MRN: [id number] Date of Birth: April 03, 1951  [facility]) CM/SW Contact  Joaquin Courts, RN Phone Number: 11/06/2019, 1:51 PM  Clinical Narrative:    CM spoke with patient's daughter [name] who reports she has spoken with [employer] and provided them with credit card information to cover the copy for her mother's hospital bed.  [name] states she did not receive any indication about a delivery date for the bed.  CM spoke with [employer] rep Ke[place] regarding this and he states the logistics department will be calling the daughter today to arrange a delivery time.  Additionally, [employer] will provide a rollator and 3in1.     Expected Discharge Plan: [place] Barriers to Discharge: Equipment Delay(Needs trilogy set up. Hospital beds out of stock)  Expected Discharge Plan and Services Expected Discharge Plan: [place] In-house Referral: NA Discharge Planning Services: NA Post Acute Care Choice: Home Health, Durable Medical Equipment Living arrangements for the past 2 months: Single Family Home                 DME Arranged: Walker rolling with seat, 3-N-1 DME Agency: [employer] Date DME Agency Contacted: 11/06/19 Time DME Agency Contacted: [phone] Representative spoke with at DME Agency: [place] ([place]) Interventions    Readmission Risk Interventions No flowsheet data found.

## 2019-11-06 NOTE — Progress Notes (Signed)
SATURATION QUALIFICATIONS: (This note is used to comply with regulatory documentation for home oxygen)  Patient Saturations on Room Air at Rest = 90%  Patient Saturations on Room Air while Ambulating =  87%  Patient Saturations on 2 Liters of oxygen while Ambulating = 94 %  Please briefly explain why patient needs home oxygen: Patient unable to maintain adequate oxygenation while ambulating on room air.

## 2019-11-07 MED ORDER — METOPROLOL SUCCINATE ER 25 MG PO TB24: 12.5000 mg | ORAL_TABLET | Freq: Every day | ORAL | 1 refills | Status: DC

## 2019-11-07 MED ORDER — POTASSIUM CHLORIDE CRYS ER 20 MEQ PO TBCR: 20.0000 meq | EXTENDED_RELEASE_TABLET | Freq: Every day | ORAL | 1 refills | Status: DC

## 2019-11-07 MED ORDER — TORSEMIDE 10 MG PO TABS: 10.0000 mg | ORAL_TABLET | Freq: Two times a day (BID) | ORAL | 1 refills | Status: DC

## 2019-11-07 NOTE — Telephone Encounter (Signed)
I spoke with patient, she will have her daughter bring paperwork by today.

## 2019-11-07 NOTE — Telephone Encounter (Signed)
Will patient need to be seen first?

## 2019-11-07 NOTE — Telephone Encounter (Signed)
Not really. I know her well and doing if for ease for family as favor as she is having difficulty with reaching out to PCP

## 2019-11-07 NOTE — TOC Transition Note (Signed)
Transition of Care [facility]) - CM/SW Discharge Note   Patient Details  Name: Jazlyne Gauger MRN: [id number] Date of Birth: Dec 05, 1950  Transition of Care [facility]) CM/SW Contact:  Trish Mage, LCSW Phone Number: 11/07/2019, 10:03 AM   Clinical Narrative:   Lane Hacker with [employer] to confirm need for Home O2 after seeing order and SAT note.  To be delivered today.  Confirmed that patient has everything she needs at home and that she is to go home via [facility] [place].  PTAR arranged. TOC sign off.    Final next level of care: [place] Barriers to Discharge: Barriers Resolved   Patient Goals and CMS Choice Patient states their goals for this hospitalization and ongoing recovery are:: Go home CMS Medicare.gov Compare Post Acute Care list provided to:: Patient Choice offered to / list presented to : Patient  Discharge Placement                       Discharge Plan and Services In-house Referral: NA Discharge Planning Services: NA Post Acute Care Choice: Home Health, Durable Medical Equipment          DME Arranged: Walker rolling with seat, 3-N-1 DME Agency: [employer] Date DME Agency Contacted: 11/06/19 Time DME Agency Contacted: [phone] Representative spoke with at DME Agency: [place] ([place]) Interventions     Readmission Risk Interventions No flowsheet data found.

## 2019-11-07 NOTE — Discharge Instructions (Signed)
Chronic Respiratory Failure  Respiratory failure is a condition in which the lungs do not work well and the breathing (respiratory) system fails. When respiratory failure occurs, it becomes difficult for the lungs to get enough oxygen or to eliminate carbon dioxide or to do both duties. If the lungs do not work properly, the heart, brain, and other body systems do not get enough oxygen. Respiratory failure is life-threatening if it is not treated. Respiratory failure can be acute or chronic. Acute respiratory failure is sudden and severe and requires emergency medical treatment. Chronic respiratory failure happens over time, usually due to a medical condition that gets worse. What are the causes? This condition may be caused by any problem that affects the heart or lungs. Causes include:  Chronic bronchitis and emphysema (COPD).  Pulmonary fibrosis.  Water in the lungs due to heart failure, lung injury, or infection (pulmonary edema).  Asthma.  Nerve or muscle diseases that make chest movements difficult, such as Leta Baptist disease or Guillain-Barre syndrome.  A collapsed lung (pneumothorax).  Pulmonary hypertension.  Chronic sleep apnea.  Pneumonia.  Obesity.  A blood clot in a lung (pulmonary embolism).  Trauma to the chest that makes breathing difficult. What increases the risk? You are more likely to develop this condition if:  You are a smoker, or have a history of smoking.  You have a weak immune system.  You have a family history of breathing problems or lung disease.  You have a long term lung disease such as COPD. What are the signs or symptoms? Symptoms of this condition include:  Shortness of breath with or without activity.  Difficulty breathing.  Wheezing.  A fast or irregular heartbeat (arrhythmia).  Chest pain or tightness.  A bluish color to the fingernail or toenail beds (cyanosis).  Confusion.  Drowsiness.  Extreme fatigue, especially with  minimal activity. How is this diagnosed? This condition may be diagnosed based on:  Your medical history.  A physical exam.  Other tests, such as: ? A chest X-ray. ? A CT scan of your lungs. ? Blood tests, such as an arterial blood gas test. This test is done to check if you have enough oxygen in your blood. ? An electrocardiogram. This test records the electrical activity of your heart. ? An echocardiogram. This test uses sound waves to produce an image of your heart.  A check of your blood pressure, heart rate, breathing rate, and blood oxygen level. How is this treated? Treatment for this condition depends on the cause. Treatment can include the following:  Getting oxygen through a nasal cannula. This is a tube that goes in your nose.  Getting oxygen through a face mask.  Receiving noninvasive positive pressure ventilation. This is a method of breathing support in which a machine blows air into your lungs through a mask. The machine allows you to breathe on your own. It helps the body take in oxygen and eliminate carbon dioxide.  Using a ventilator. This is a breathing machine that delivers oxygen to the lungs through a breathing tube that is put into the trachea. This machine is used when you can no longer breathe well enough on your own.  Medicines to help with breathing, such as: ? Medicines that open up and relax air passages, such as bronchodilators. These may be given through a device that turns liquid medicines into a mist you can breathe in (nebulizer). These medicines help with breathing. ? Diuretics. These medicines get rid of extra fluid out  of your lungs, which can help you breathe better. ? Steroid medicines. These decrease inflammation in the lungs. ? Antibiotic medicines. These may be given to treat a bacterial infection, such as pneumonia.  Pulmonary rehabilitation. This is an exercise program that strengthens the muscles in your chest and helps you learn breathing  techniques in order to manage your condition. Follow these instructions at home: Medicines  Take over-the-counter and prescription medicines only as told by your health care provider.  If you were prescribed an antibiotic medicine, take it as told by your health care provider. Do not stop taking the antibiotic even if you start to feel better. General instructions  Use oxygen therapy and pulmonary rehabilitation if directed to by your health care provider. If you require home oxygen therapy, ask your health care provider whether you should purchase a pulse oximeter to measure your oxygen level at home.  Work with your health care provider to create a plan to help you deal with your condition. Follow this plan.  Do not use any products that contain nicotine or tobacco, such as cigarettes and e-cigarettes. If you need help quitting, ask your health care provider.  Avoid exposure to irritants that make your breathing problems worse. These include smoke, chemicals, and fumes.  Stay active, but balance activity with periods of rest. Exercise and physical activity will help you maintain your ability to do things you want to do.  Stay up to date on all vaccines, especially yearly influenza and pneumonia vaccines.  Avoid people who are sick as well as crowded places during the flu season.  Keep all follow-up visits as told by your health care provider. This is important. Contact a health care provider if:  Your shortness of breath gets worse and you cannot do the things you used to do.  You have increased mucus (sputum), wheezing, coughing, or loss of energy.  You are on oxygen therapy and you are starting to need more.  You need to use your medicines more often.  You have a fever. Get help right away if:  Your shortness of breath becomes worse.  You are unable to say more than a few words without having to catch your breath.  You develop chest pain or  tightness. Summary  Respiratory failure is a condition in which the lungs do not work well and the breathing system fails.  This condition can be very serious and is often life-threatening.  This condition is diagnosed with tests and can be treated with medicines or oxygen.  Contact a health care provider if your shortness of breath gets worse or if you need to use your oxygen or medicines more often than before. This information is not intended to replace advice given to you by your health care provider. Make sure you discuss any questions you have with your health care provider. Document Revised: 05/15/2017 Document Reviewed: 06/13/2016 Elsevier Patient Education  [place].   CPAP and BPAP Information CPAP and BPAP are methods of helping a person breathe with the use of air pressure. CPAP stands for "continuous positive airway pressure." BPAP stands for "bi-level positive airway pressure." In both methods, air is blown through your nose or mouth and into your air passages to help you breathe well. CPAP and BPAP use different amounts of pressure to blow air. With CPAP, the amount of pressure stays the same while you breathe in and out. With BPAP, the amount of pressure is increased when you breathe in (inhale) so that  you can take larger breaths. Your health care provider will recommend whether CPAP or BPAP would be more helpful for you. Why are CPAP and BPAP treatments used? CPAP or BPAP can be helpful if you have:  Sleep apnea.  Chronic obstructive pulmonary disease (COPD).  Heart failure.  Medical conditions that weaken the muscles of the chest including muscular dystrophy, or neurological diseases such as amyotrophic lateral sclerosis (ALS).  Other problems that cause breathing to be weak, abnormal, or difficult. CPAP is most commonly used for obstructive sleep apnea (OSA) to keep the airways from collapsing when the muscles relax during sleep. How is CPAP or BPAP  administered? Both CPAP and BPAP are provided by a small machine with a flexible plastic tube that attaches to a plastic mask. You wear the mask. Air is blown through the mask into your nose or mouth. The amount of pressure that is used to blow the air can be adjusted on the machine. Your health care provider will determine the pressure setting that should be used based on your individual needs. When should CPAP or BPAP be used? In most cases, the mask only needs to be worn during sleep. Generally, the mask needs to be worn throughout the night and during any daytime naps. People with certain medical conditions may also need to wear the mask at other times when they are awake. Follow instructions from your health care provider about when to use the machine. What are some tips for using the mask?   Because the mask needs to be snug, some people feel trapped or closed-in (claustrophobic) when first using the mask. If you feel this way, you may need to get used to the mask. One way to do this is by holding the mask loosely over your nose or mouth and then gradually applying the mask more snugly. You can also gradually increase the amount of time that you use the mask.  Masks are available in various types and sizes. Some fit over your mouth and nose while others fit over just your nose. If your mask does not fit well, talk with your health care provider about getting a different one.  If you are using a mask that fits over your nose and you tend to breathe through your mouth, a chin strap may be applied to help keep your mouth closed.  The CPAP and BPAP machines have alarms that may sound if the mask comes off or develops a leak.  If you have trouble with the mask, it is very important that you talk with your health care provider about finding a way to make the mask easier to tolerate. Do not stop using the mask. Stopping the use of the mask could have a negative impact on your health. What are some  tips for using the machine?  Place your CPAP or BPAP machine on a secure table or stand near an electrical outlet.  Know where the on/off switch is located on the machine.  Follow instructions from your health care provider about how to set the pressure on your machine and when you should use it.  Do not eat or drink while the CPAP or BPAP machine is on. Food or fluids could get pushed into your lungs by the pressure of the CPAP or BPAP.  Do not smoke. Tobacco smoke residue can damage the machine.  For home use, CPAP and BPAP machines can be rented or purchased through home health care companies. Many different brands of machines are  available. Renting a machine before purchasing may help you find out which particular machine works well for you.  Keep the CPAP or BPAP machine and attachments clean. Ask your health care provider for specific instructions. Get help right away if:  You have redness or open areas around your nose or mouth where the mask fits.  You have trouble using the CPAP or BPAP machine.  You cannot tolerate wearing the CPAP or BPAP mask.  You have pain, discomfort, and bloating in your abdomen. Summary  CPAP and BPAP are methods of helping a person breathe with the use of air pressure.  Both CPAP and BPAP are provided by a small machine with a flexible plastic tube that attaches to a plastic mask.  If you have trouble with the mask, it is very important that you talk with your health care provider about finding a way to make the mask easier to tolerate. This information is not intended to replace advice given to you by your health care provider. Make sure you discuss any questions you have with your health care provider. Document Revised: 09/22/2018 Document Reviewed: 04/21/2016 Elsevier Patient Education  [place].

## 2019-11-15 NOTE — Progress Notes (Addendum)
11/15/2019 at 1451:  CM received e-mail notification stating patient;s daughter has called the hospitalist office stating she was under the impression her mom would be receiving [facility] services.  Patient has been discharged for 1 week and no services have started.  CM reviewed patient's chart and noted that no HH orders were placed prior to discharge.  CM reached out to patient's daughter and discussed this with her.  Unfortunately patient has been out of the hospital too long for hospitalist to write [facility] orders and pcp will need to be contacted for this.  CM left voicemail for pcp regarding this, but has not received a response.  Patient's daughter reports she does not have a preference for agency, so CM contacted [place] who states agency can service patient and he is working with CM to contact pcp for orders.  CM updated patient's daughter regarding this.  Daughter reports she has also contacted pcp but has not received a response yet.  Alvis Lemmings and daughter will continue attempting to reach pcp or one of patient's other medical providers to obtain orders and once orders in place, agency will start services.

## 2019-11-21 ENCOUNTER — Telehealth: Payer: Self-pay

## 2019-11-22 NOTE — Telephone Encounter (Signed)
I do not need to see her now. Her issues were not necessarily heart related. Why dont we push this by 3 months

## 2019-11-30 ENCOUNTER — Encounter: Payer: Self-pay | Admitting: Pulmonary Disease

## 2019-11-30 ENCOUNTER — Telehealth: Payer: Self-pay | Admitting: Pulmonary Disease

## 2019-11-30 ENCOUNTER — Institutional Professional Consult (permissible substitution): Payer: Medicare PPO | Admitting: Pulmonary Disease

## 2019-11-30 ENCOUNTER — Ambulatory Visit: Payer: Medicare PPO | Admitting: Pulmonary Disease

## 2019-11-30 ENCOUNTER — Other Ambulatory Visit: Payer: Self-pay

## 2019-11-30 VITALS — BP 110/68 | HR 74 | Temp 98.1°F | Ht 66.0 in | Wt 314.4 lb

## 2019-11-30 DIAGNOSIS — G4733 Obstructive sleep apnea (adult) (pediatric): Secondary | ICD-10-CM | POA: Diagnosis not present

## 2019-11-30 DIAGNOSIS — E662 Morbid (severe) obesity with alveolar hypoventilation: Secondary | ICD-10-CM | POA: Diagnosis not present

## 2019-11-30 DIAGNOSIS — J449 Chronic obstructive pulmonary disease, unspecified: Secondary | ICD-10-CM | POA: Diagnosis not present

## 2019-11-30 DIAGNOSIS — J9622 Acute and chronic respiratory failure with hypercapnia: Secondary | ICD-10-CM | POA: Diagnosis not present

## 2019-11-30 MED ORDER — ANORO ELLIPTA 62.5-25 MCG/INH IN AEPB
1.0000 | INHALATION_SPRAY | Freq: Every day | RESPIRATORY_TRACT | 0 refills | Status: DC
Start: 2019-11-30 — End: 2019-11-30

## 2019-11-30 MED ORDER — ANORO ELLIPTA 62.5-25 MCG/INH IN AEPB
1.0000 | INHALATION_SPRAY | Freq: Every day | RESPIRATORY_TRACT | 0 refills | Status: DC
Start: 2019-11-30 — End: 2019-12-13

## 2019-11-30 NOTE — Patient Instructions (Signed)
  Schedule PFTs. Schedule split-night study. You'll need Covid testing prior to both these tests.  Check oxygen level off O2  Trial of Anoro -1 puff daily, call for prescription of this works.  We will consider pulmonary rehab at [facility] in the future

## 2019-11-30 NOTE — Telephone Encounter (Signed)
Spoke with the pt's daughter  She states that she had checked the pt's o2 after she had "dozed off" and it was 82%ra  She had just woke up per daughter  I called and spoke with the pt and she states that she was asleep while her daughter checked it  I had her check it again while awake and it was 95%ra  Will forward to Dr [name] to make him aware

## 2019-11-30 NOTE — Progress Notes (Signed)
Subjective:    Patient ID: Daisy [name], female    DOB: 14-Jun-1951, 69 y.o.   MRN: [id number]  HPI  Chief Complaint  Patient presents with  . Consult    wakes up 2-3 am and unable to go to sleep again, apnea while admitted to hospital, DOE, no cough, BiPap   69 year old smoker presents after hospital visit to establish care for COPD, OSA and acute on chronic hypercarbic and hypoxic respiratory failure I have reviewed her hospital discharge summary and tests performed including consultations Accompanied by her daughter [name] who is ex- EMT and provides additional history  She smokes starting in her 76s until the point of admission, more than 40 pack years Admitted 5/3-10/23/19 with working diagnosis of decompensated diastolic heart failure and cor pulmonale in the setting of untreated OSA/ OHS & ?  Underlying COPD  ABG 5/4 was 1.1 7/108/89 -She was initially diuresed and treated with BiPAP , was discharged to [place] home but readmitted since BiPAP could not be supplied, she was readmitted 5/19-5/24 and eventually discharged on trilogy machine with full facemask  She reports significant improvement, daughter reports that she is more awake, reviewed compliance which shows excellent compliance more than 6 hours average every night with good ventilation .  ABG closer to discharge was 7.3 4/58/09  Chronic diastolic CHF Does not tolerate Entresto - continue beta-blocker -discharged on Demadex and pedal edema appears improved   PMH - Morbid obesity, chronic dyspnea, left breast cancer status post radiation, peripheral vascular disease, depression, arthritis, acid reflux disease  New full sleepiness score is 6. Bedtime is between 10:11 PM, sleep latency is minimal, she sleeps on her side with 2 pillows, has oxygen blended into her trilogy machine, reports 1-2 nocturnal awakenings and is out of bed by 8 AM feeling rested with dryness of mouth but denies headaches On  ambulation today her oxygen saturation dropped from 94% at rest and 90% and heart rate increased from 77-1 01. She is started on home PT  Significant tests/ events reviewed  09/2019: Ejection fraction 65% with possible RV failure and indeterminate diastolic dysfunction.  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 Acid reflux   . Arthritis   . Cholecystitis   . Depression   . Dyspnea   . History of radiation therapy 01/20/17-03/02/17   left breast was treated to 50.4 Gy in 18 fractions  . Hyperlipemia   . Irregular heart rate   . Malignant neoplasm of upper-outer quadrant of left breast in female, estrogen receptor positive ([place]) 09/30/2016  . Obesity   . Peripheral vascular disease ([place])   . Pneumonia    Past Surgical History:  Procedure Laterality Date  . ABDOMINAL HYSTERECTOMY    . BREAST LUMPECTOMY Left 11/19/2016   BREAST LUMPECTOMY WITH RADIOACTIVE SEED AND SENTINEL LYMPH NODE BIOPSY (Left)  . BREAST LUMPECTOMY WITH RADIOACTIVE SEED AND SENTINEL LYMPH NODE BIOPSY Left 11/19/2016   Procedure: BREAST LUMPECTOMY WITH RADIOACTIVE SEED AND SENTINEL LYMPH NODE BIOPSY;  Surgeon: Rolm Bookbinder, MD;  Location: [place];  Service: General;  Laterality: Left;  . CESAREAN SECTION    . CESAREAN SECTION     x 2  . CHOLECYSTECTOMY  2007  . KNEE ARTHROSCOPY     bilateral  . KNEE SURGERY Bilateral   . LOWER EXTREMITY ANGIOGRAM N/A 04/04/2014  Procedure: LOWER EXTREMITY ANGIOGRAM;  Surgeon: Laverda Page, MD;  Location: [facility] LAB;  Service: Cardiovascular;  Laterality: N/A;  . OVARIAN CYST REMOVAL    . RE-EXCISION OF BREAST LUMPECTOMY Left 12/12/2016   Procedure: RE-EXCISION OF LEFT BREAST LUMPECTOMY;  Surgeon: Rolm Bookbinder, MD;  Location: [place];  Service: General;  Laterality: Left;  . ROTATOR CUFF  REPAIR Right   . TONSILLECTOMY    . WRIST SURGERY Bilateral     Allergies  Allergen Reactions  . Levaquin [Levofloxacin] Other (See Comments)    Body aches  . Sulfur Hives    Social History   Socioeconomic History  . Marital status: Single    Spouse name: Not on file  . Number of children: 2  . Years of education: Not on file  . Highest education level: Not on file  Occupational History  . Not on file  Tobacco Use  . Smoking status: Former Smoker    Packs/day: 0.50    Years: 30.00    Pack years: 15.00    Types: E-cigarettes    Quit date: 11/02/2019    Years since quitting: 0.0  . Smokeless tobacco: Never Used  Vaping Use  . Vaping Use: Never used  Substance and Sexual Activity  . Alcohol use: No  . Drug use: No  . Sexual activity: Not on file  Other Topics Concern  . Not on file  Social History Narrative  . Not on file   Social Determinants of Health   Financial Resource Strain:   . Difficulty of Paying Living Expenses:   Food Insecurity:   . Worried About Charity fundraiser in the Last Year:   . Arboriculturist in the Last Year:   Transportation Needs:   . Film/video editor (Medical):   Marland Kitchen Lack of Transportation (Non-Medical):   Physical Activity:   . Days of Exercise per Week:   . Minutes of Exercise per Session:   Stress:   . Feeling of Stress :   Social Connections:   . Frequency of Communication with Friends and Family:   . Frequency of Social Gatherings with Friends and Family:   . Attends Religious Services:   . Active Member of Clubs or Organizations:   . Attends Archivist Meetings:   Marland Kitchen Marital Status:   Intimate Partner Violence:   . Fear of Current or Ex-Partner:   . Emotionally Abused:   Marland Kitchen Physically Abused:   . Sexually Abused:      Family History  Problem Relation Age of Onset  . Heart disease Mother   . Lung cancer Father   . Heart disease Father   . Heart disease Brother       Review of  Systems Constitutional: negative for anorexia, fevers and sweats  Eyes: negative for irritation, redness and visual disturbance  Ears, nose, mouth, throat, and face: negative for earaches, epistaxis, nasal congestion and sore throat  Respiratory: negative for cough, , sputum and wheezing  Cardiovascular: negative for chest pain,orthopnea, palpitations and syncope  Gastrointestinal: negative for abdominal pain, constipation, diarrhea, melena, nausea and vomiting  Genitourinary:negative for dysuria, frequency and hematuria  Hematologic/lymphatic: negative for bleeding, easy bruising and lymphadenopathy  Musculoskeletal:negative for arthralgias, muscle weakness and stiff joints  Neurological: negative for coordination problems, gait problems, headaches and weakness  Endocrine: negative for diabetic symptoms including polydipsia, polyuria and weight loss     Objective:   Physical Exam  Gen. Pleasant, obese, in no distress, normal  affect ENT - no pallor,icterus, no post nasal drip, class 2-3 airway Neck: No JVD, no thyromegaly, no carotid bruits Lungs: no use of accessory muscles, no dullness to percussion, decreased without rales or rhonchi  Cardiovascular: Rhythm regular, heart sounds  normal, no murmurs or gallops, 1+ peripheral edema Abdomen: soft and non-tender, no hepatosplenomegaly, BS normal. Musculoskeletal: No deformities, no cyanosis or clubbing Neuro:  alert, non focal, no tremors       Assessment & Plan:

## 2019-11-30 NOTE — Telephone Encounter (Signed)
She may drop during sleep due to OSA. She should continue using her oxygen blended into trilogy machine during sleep. During daytime she can try staying off oxygen when awake for longer periods

## 2019-11-30 NOTE — Assessment & Plan Note (Addendum)
Okay to stay off oxygen during rest. We will decide about nocturnal oxygen after sleep study. Stay on Trilogy , if we are able to control events and hypoventilation with BiPAP during sleep study then we will switch to BiPAP and this may decrease cost for her  Pulmonary rehab in the future

## 2019-11-30 NOTE — Assessment & Plan Note (Signed)
Schedule PFTs, likely has COPD underlying. Empiric trial of Anoro meantime.  We will decide based on PFTs whether to continue long-term. Tobacco cessation again emphasized as most important intervention, she is at high risk for relapse

## 2019-11-30 NOTE — Assessment & Plan Note (Signed)
Given excessive daytime somnolence, narrow pharyngeal exam, witnessed apneas & loud snoring, obstructive sleep apnea is very likely & an overnight polysomnogram will be scheduled as a split study. The pathophysiology of obstructive sleep apnea , it's cardiovascular consequences & modes of treatment including CPAP were discused with the patient in detail & they evidenced understanding.  

## 2019-12-01 ENCOUNTER — Ambulatory Visit: Payer: Medicare PPO | Admitting: Oncology

## 2019-12-01 ENCOUNTER — Inpatient Hospital Stay: Payer: Medicare PPO

## 2019-12-01 NOTE — Telephone Encounter (Signed)
Spoke with patient and relayed Dr. Bari Mantis information to her.  She verbalized understanding.  Nothing further needed.

## 2019-12-02 ENCOUNTER — Ambulatory Visit: Payer: Medicare Other | Admitting: Cardiology

## 2019-12-05 ENCOUNTER — Telehealth: Payer: Self-pay

## 2019-12-10 ENCOUNTER — Other Ambulatory Visit: Payer: Self-pay | Admitting: Cardiology

## 2019-12-13 ENCOUNTER — Telehealth: Payer: Self-pay | Admitting: Pulmonary Disease

## 2019-12-13 MED ORDER — ANORO ELLIPTA 62.5-25 MCG/INH IN AEPB: 1.0000 | INHALATION_SPRAY | Freq: Every day | RESPIRATORY_TRACT | 1 refills | Status: DC

## 2019-12-13 NOTE — Telephone Encounter (Signed)
Spoke with pt. She is needing a prescription sent in for Anoro. This has been taken care of. Nothing further was needed.

## 2019-12-13 NOTE — Telephone Encounter (Signed)
Pt is scheduled for 7/17 @ 8.  Gave app tinfo to Daisy [name].  Nothing further needed at this time

## 2019-12-13 NOTE — Telephone Encounter (Signed)
Working on this one.  

## 2019-12-14 ENCOUNTER — Telehealth: Payer: Self-pay | Admitting: Pulmonary Disease

## 2019-12-14 MED ORDER — TRELEGY ELLIPTA 100-62.5-25 MCG/INH IN AEPB
1.0000 | INHALATION_SPRAY | Freq: Every day | RESPIRATORY_TRACT | 5 refills | Status: DC
Start: 2019-12-14 — End: 2020-03-26

## 2019-12-14 NOTE — Telephone Encounter (Signed)
Called and spoke with pt's daughter Daisy [name] who stated that she was told by pharmacy that anoro was requiring a PA. Asked Daisy [name] if pt has tried any other inhalers other than the anoro and she stated that pt has only tried that and the albuterol inhaler.  Since pt has not tried any other inhalers than the anoro, a PA would be denied as pt has not tried/failed at least two alternate inhalers.  Checked formulary list online with pt's insurance and it states that the covered alternatives include:  Advair Diskus and Advair HFA, Airduo Chiropodist and Airduo Respiclick, Arnuity, Asmanex HFA, Atrovent HFA, Bevespi, Breo, [place], [place], Flovent, Qvar, Spiriva, Symbicort, and Trelegy  Dr. [name], please advise.

## 2019-12-14 NOTE — Telephone Encounter (Signed)
Since Trelegy is covered, can change to Trelegy once daily  Once we get PFTs we will decide whether she needs this long-term or not I see that PFTs from 6/16 were not done, does she need to reschedule?

## 2019-12-14 NOTE — Telephone Encounter (Signed)
PFT is scheduled for 8/26.  Called and spoke with pt's daughter [name] letting her know that we were switching pt from Anoro to Trelegy and she verbalized understanding. Rx for Trelegy has been sent to preferred pharmacy. Nothing further needed.

## 2019-12-31 ENCOUNTER — Ambulatory Visit (HOSPITAL_BASED_OUTPATIENT_CLINIC_OR_DEPARTMENT_OTHER): Payer: Medicare PPO | Attending: Pulmonary Disease | Admitting: Pulmonary Disease

## 2019-12-31 ENCOUNTER — Other Ambulatory Visit: Payer: Self-pay

## 2019-12-31 DIAGNOSIS — E669 Obesity, unspecified: Secondary | ICD-10-CM | POA: Diagnosis not present

## 2019-12-31 DIAGNOSIS — G4733 Obstructive sleep apnea (adult) (pediatric): Secondary | ICD-10-CM | POA: Diagnosis not present

## 2019-12-31 DIAGNOSIS — J449 Chronic obstructive pulmonary disease, unspecified: Secondary | ICD-10-CM | POA: Diagnosis not present

## 2019-12-31 DIAGNOSIS — I509 Heart failure, unspecified: Secondary | ICD-10-CM | POA: Diagnosis not present

## 2019-12-31 DIAGNOSIS — I472 Ventricular tachycardia: Secondary | ICD-10-CM | POA: Diagnosis not present

## 2019-12-31 DIAGNOSIS — I110 Hypertensive heart disease with heart failure: Secondary | ICD-10-CM | POA: Diagnosis not present

## 2019-12-31 DIAGNOSIS — R5383 Other fatigue: Secondary | ICD-10-CM | POA: Insufficient documentation

## 2019-12-31 DIAGNOSIS — G4736 Sleep related hypoventilation in conditions classified elsewhere: Secondary | ICD-10-CM | POA: Insufficient documentation

## 2020-01-02 ENCOUNTER — Other Ambulatory Visit: Payer: Self-pay

## 2020-01-03 DIAGNOSIS — G4733 Obstructive sleep apnea (adult) (pediatric): Secondary | ICD-10-CM

## 2020-01-03 NOTE — Procedures (Signed)
Patient Name: Daisy [name], Daisy [name] Date: 12/31/2019 Gender: Female D.O.B: [birth year] Age (years): 35 Referring Provider: Kara Mead MD, ABSM Height (inches): 66 Interpreting Physician: Kara Mead MD, ABSM Weight (lbs): 314 RPSGT: [place], [place] BMI: 51 MRN: [id number] Neck Size: 20.00 <br> <br> CLINICAL INFORMATION Sleep Study Type: NPSG    Indication for sleep study: Congestive Heart Failure, COPD, Fatigue, Hypertension, Obesity, Snoring    Epworth Sleepiness Score: 7    SLEEP STUDY TECHNIQUE As per the AASM Manual for the Scoring of Sleep and Associated Events v2.3 (April 2016) with a hypopnea requiring 4% desaturations.  The channels recorded and monitored were frontal, central and occipital EEG, electrooculogram (EOG), submentalis EMG (chin), nasal and oral airflow, thoracic and abdominal wall motion, anterior tibialis EMG, snore microphone, electrocardiogram, and pulse oximetry.  MEDICATIONS Medications self-administered by patient taken the night of the study : N/A  SLEEP ARCHITECTURE The study was initiated at 10:01:09 PM and ended at 4:31:41 AM.  Sleep onset time was 3.8 minutes and the sleep efficiency was 84.0%%. The total sleep time was 328.2 minutes.  Stage REM latency was 116.5 minutes.  The patient spent 10.4%% of the night in stage N1 sleep, 64.4%% in stage N2 sleep, 12.7%% in stage N3 and 12.5% in REM.  Alpha intrusion was absent.  Supine sleep was 15.39%.  RESPIRATORY PARAMETERS The overall apnea/hypopnea index (AHI) was 1.8 per hour. There were 6 total apneas, including 6 obstructive, 0 central and 0 mixed apneas. There were 4 hypopneas and 77 RERAs.  The AHI during Stage REM sleep was 13.2 per hour.  AHI while supine was 0.0 per hour.  The mean oxygen saturation was 89.7%. The minimum SpO2 during sleep was 82.0%.  loud snoring was noted during this study.  CARDIAC DATA The 2 lead EKG demonstrated sinus rhythm. The mean heart rate was  73.6 beats per minute. Other EKG findings include: Ventricular Tachycardia, PVCs. LEG MOVEMENT DATA The total PLMS were 0 with a resulting PLMS index of 0.0. Associated arousal with leg movement index was 2.6 .  IMPRESSIONS - No significant obstructive sleep apnea occurred during this study (AHI = 1.8/h). - No significant central sleep apnea occurred during this study (CAI = 0.0/h). - Mild oxygen desaturation was noted during this study (Min O2 = 82.0%) especially during REM sleep which may indicate hypoventilation - The patient snored with loud snoring volume. - EKG findings include Ventricular Tachycardia, PVCs. - Clinically significant periodic limb movements did not occur during sleep. No significant associated arousals.   DIAGNOSIS - Resting & Nocturnal Hypoxemia (G47.36) - Mild OSA   RECOMMENDATIONS - O2 1 L durig sleep - BiPAP therapy would be helpful for suspected hypoventilation - Avoid alcohol, sedatives and other CNS depressants that may worsen sleep apnea and disrupt normal sleep architecture. - Sleep hygiene should be reviewed to assess factors that may improve sleep quality. - Weight management and regular exercise should be initiated or continued if appropriate.   Kara Mead MD Board Certified in [place]

## 2020-01-04 ENCOUNTER — Ambulatory Visit (INDEPENDENT_AMBULATORY_CARE_PROVIDER_SITE_OTHER): Payer: Medicare PPO | Admitting: Nurse Practitioner

## 2020-01-04 ENCOUNTER — Other Ambulatory Visit: Payer: Self-pay

## 2020-01-04 ENCOUNTER — Encounter: Payer: Self-pay | Admitting: Nurse Practitioner

## 2020-01-04 ENCOUNTER — Telehealth: Payer: Self-pay

## 2020-01-04 VITALS — BP 128/76 | HR 86 | Temp 97.8°F | Ht 67.0 in | Wt 320.0 lb

## 2020-01-04 DIAGNOSIS — N183 Chronic kidney disease, stage 3 unspecified: Secondary | ICD-10-CM

## 2020-01-04 DIAGNOSIS — Z6841 Body Mass Index (BMI) 40.0 and over, adult: Secondary | ICD-10-CM

## 2020-01-04 DIAGNOSIS — E782 Mixed hyperlipidemia: Secondary | ICD-10-CM

## 2020-01-04 DIAGNOSIS — I10 Essential (primary) hypertension: Secondary | ICD-10-CM

## 2020-01-04 DIAGNOSIS — R7303 Prediabetes: Secondary | ICD-10-CM | POA: Diagnosis not present

## 2020-01-04 DIAGNOSIS — E785 Hyperlipidemia, unspecified: Secondary | ICD-10-CM

## 2020-01-04 DIAGNOSIS — Z1159 Encounter for screening for other viral diseases: Secondary | ICD-10-CM

## 2020-01-04 DIAGNOSIS — I5032 Chronic diastolic (congestive) heart failure: Secondary | ICD-10-CM

## 2020-01-04 DIAGNOSIS — F419 Anxiety disorder, unspecified: Secondary | ICD-10-CM

## 2020-01-04 HISTORY — DX: Morbid (severe) obesity due to excess calories: E66.01

## 2020-01-04 HISTORY — DX: Prediabetes: R73.03

## 2020-01-04 HISTORY — DX: Hyperlipidemia, unspecified: E78.5

## 2020-01-04 HISTORY — DX: Chronic diastolic (congestive) heart failure: I50.32

## 2020-01-04 HISTORY — DX: Chronic kidney disease, stage 3 unspecified: N18.30

## 2020-01-04 MED ORDER — POTASSIUM CHLORIDE CRYS ER 20 MEQ PO TBCR: 20.0000 meq | EXTENDED_RELEASE_TABLET | Freq: Every day | ORAL | 1 refills | Status: DC

## 2020-01-04 MED ORDER — TORSEMIDE 10 MG PO TABS: 10.0000 mg | ORAL_TABLET | Freq: Two times a day (BID) | ORAL | 1 refills | Status: DC

## 2020-01-04 MED ORDER — METOPROLOL SUCCINATE ER 25 MG PO TB24: 12.5000 mg | ORAL_TABLET | Freq: Every day | ORAL | 1 refills | Status: DC

## 2020-01-04 NOTE — Telephone Encounter (Signed)
Patient called and wants to make sure she can get her medication refilled. She knows epic was down when she was here but wants to be sure they can be sent in now. Patient needs Klor-con, torsemide, and metoprolol refilled today. [facility] [place]

## 2020-01-04 NOTE — Telephone Encounter (Signed)
Patient has two more days of medications left. Do you want her to come tomorrow for labs? And do they need to be fasting or non fasting?

## 2020-01-04 NOTE — Telephone Encounter (Signed)
She had abnormal  Cmet in May when she was hospitalized. I need to see her electrolytes and renal function to recommend those chronic medication doses. We talked about fasting labs- when can she come in to the lab to get fasting versus non fasting labs drawn? She is available any day. When does she run out of her meds?

## 2020-01-04 NOTE — Patient Instructions (Addendum)
First visit to establish care with a new primary care provider today and EPIC was down during her entire visit. I will ask her to come back in for routine laboratory study.  History obtained from her daughter Daisy [name] who was a paramedic and is a good historian. Daisy [name] has a recent history of multiple medical problems but she seems to be overall stable now.   We  need to work on cardiovascular risk factor modification, preventative health, and medication monitoring. She seems to be settled with her pulmonary status and sleep apnea therapy. She is actively followed by cardiology, pulmonology, and oncology.

## 2020-01-04 NOTE — Progress Notes (Signed)
New Patient Office Visit  Subjective:  Patient ID: Daisy [name], female    DOB: November 23, 1950  Age: 69 y.o. MRN: [id number]  CC:  Chief Complaint  Patient presents with  . New Patient (Initial Visit)    establish care    HPI Daisy [name] is a 69 year old patient who presents to establish care with primary care provider. Her concern today is to get tested for newly Dx diabetes.   She was recently hospitalized at [facility] 10/17/19- 11/02/19 with fatigue, DOE, hypercarbia, and was found to be in decompensated diastolic heart failure or cor pulmonale in setting of untreated OSA. She improved with positive pressure ventilation and IV diuresis. Urine culture grew E. coli which was treated with Rocephin/Keflex. On 11/02/2019 through 11/07/2019 she was readmitted on to [facility] d/t lack of a BiPAP machine at said facility. The patient had no complaints with preserved O2 saturations on her usual oxygen support. She was clinically stable during that hospital course.   1 acute on chronic hypoxic and hypercarbic respiratory failure: Multifactorial secondary to decompensated diastolic CHF, cor pulmonale, pulmonary edema, untreated OSA.She was initially diuresed with IV furosemide changed to torsemide 10 mg twice daily. History of chronic leg edema and venous stasis discharge close to baseline.   2. Acute on chronic diastolic CHF: BNP on admission was 769, started on IV Lasix twice daily switch to Demadex. Developed hypotension after starting Entresto and required Levophed drip. Blood pressure has been stable.  3. OSA: She presents today on BiPAP with continuous 2 L per nasal cannula at night.  She has since had follow-up with repeat sleep study by Dr. [name]: Which showed mild OSA, resting and nocturnal hypoxemia. You recommended O2 1 L during sleep, BiPAP therapy for suspected hypoventilation, avoid alcohol, sedatives, and other CNS depressants that may worsen sleep apnea and  disrupt normal sleep architecture, sleep hygiene should be reviewed to assess factors that may improve sleep quality, weight management and regular exercise if appropriate  4. Chronic kidney disease stage IIIb, creatinine 1.33 baseline. K-Dur 20 milliequivalents daily by mouth daily in addition to Demadex  5. GERD: Presents on pantoprazole 20 mg daily without any upper GI complaints   6. Peripheral arterial disease: She has a 6 inch stent in her left femoral artery, she quit smoking. She presents on aspirin and Pletal  7. Morbid obesity/HLD: Mass index is 50.12 . Reports she cannot tolerate any statins. Crestor caused myalgias. Need lab records.  8. Chronic knee arthritis: Under she must avoid all NSAIDs, can use topical medications. Uses a walker at home. Had physical therapy in May to increase strength and is at baseline.  9. Prediabetes:  A1c of 6.3 while acutely ill in May and needs rechecked.  10. Hx of left breast cancer invasive ductal carcinoma, grade 1,  2019 which was treated with radiation therapy, left lumpectomy, no CTX, and on Arimidex followed actively by Oncology, Dr. Jana Hakim.  Past Medical History:  Diagnosis Date  . Acid reflux   . Acute heart failure ([place]) 10/17/2019  . Arthritis   . Breast cancer of upper-outer quadrant of right female breast ([place]) 11/19/2016  . Chicken pox   . Cholecystitis   . Chronic diastolic heart failure ([place]) 01/04/2020  . CKD (chronic kidney disease), stage III 01/04/2020  . Depression   . Dyspnea   . History of radiation therapy 01/20/17-03/02/17   left breast was treated to 50.4 Gy in 18 fractions  . HLD (hyperlipidemia) 01/04/2020  .  Hyperlipemia   . Irregular heart rate   . Malignant neoplasm of upper-outer quadrant of left breast in female, estrogen receptor positive ([place]) 09/30/2016  . Morbid obesity with BMI of 50.0-59.9, adult ([place]) 01/04/2020  . Obesity   . Peripheral vascular disease ([place])   . Pneumonia   . Prediabetes 01/04/2020  .  Urinary tract infection     Past Surgical History:  Procedure Laterality Date  . ABDOMINAL HYSTERECTOMY    . BREAST LUMPECTOMY Left 11/19/2016   BREAST LUMPECTOMY WITH RADIOACTIVE SEED AND SENTINEL LYMPH NODE BIOPSY (Left)  . BREAST LUMPECTOMY WITH RADIOACTIVE SEED AND SENTINEL LYMPH NODE BIOPSY Left 11/19/2016   Procedure: BREAST LUMPECTOMY WITH RADIOACTIVE SEED AND SENTINEL LYMPH NODE BIOPSY;  Surgeon: Rolm Bookbinder, MD;  Location: [facility];  Service: General;  Laterality: Left;  . CESAREAN SECTION    . CESAREAN SECTION     x 2  . CHOLECYSTECTOMY  2007  . KNEE ARTHROSCOPY     bilateral  . KNEE SURGERY Bilateral   . LOWER EXTREMITY ANGIOGRAM N/A 04/04/2014   Procedure: LOWER EXTREMITY ANGIOGRAM;  Surgeon: Laverda Page, MD;  Location: [facility] CATH LAB;  Service: Cardiovascular;  Laterality: N/A;  . OVARIAN CYST REMOVAL    . RE-EXCISION OF BREAST LUMPECTOMY Left 12/12/2016   Procedure: RE-EXCISION OF LEFT BREAST LUMPECTOMY;  Surgeon: Rolm Bookbinder, MD;  Location: [facility];  Service: General;  Laterality: Left;  . ROTATOR CUFF REPAIR Right   . TONSILLECTOMY    . WRIST SURGERY Bilateral     Family History  Problem Relation Age of Onset  . Heart disease Mother   . Lung cancer Father   . Heart disease Father   . Heart disease Brother     Social History   Socioeconomic History  . Marital status: Single    Spouse name: Not on file  . Number of children: 2  . Years of education: Not on file  . Highest education level: Not on file  Occupational History  . Not on file  Tobacco Use  . Smoking status: Former Smoker    Packs/day: 0.50    Years: 30.00    Pack years: 15.00    Types: E-cigarettes    Quit date: 11/02/2019    Years since quitting: 0.1  . Smokeless tobacco: Never Used  Vaping Use  . Vaping Use: Never used  Substance and Sexual Activity  . Alcohol use: No  . Drug use: No  . Sexual activity: Not on file  Other Topics Concern  . Not on file  Social History  Narrative   Retired Recruitment consultant. Lives in her own home. Her daughter Daisy [name] who retired from paramedic job and her partner moved in. There is another daughter Daisy [name] who lives local. No grandchildren.   Social Determinants of Health   Financial Resource Strain:   . Difficulty of Paying Living Expenses:   Food Insecurity:   . Worried About Charity fundraiser in the Last Year:   . Arboriculturist in the Last Year:   Transportation Needs:   . Film/video editor (Medical):   Marland Kitchen Lack of Transportation (Non-Medical):   Physical Activity:   . Days of Exercise per Week:   . Minutes of Exercise per Session:   Stress:   . Feeling of Stress :   Social Connections:   . Frequency of Communication with Friends and Family:   . Frequency of Social Gatherings with Friends and Family:   . Attends  Religious Services:   . Active Member of Clubs or Organizations:   . Attends Archivist Meetings:   Marland Kitchen Marital Status:   Intimate Partner Violence:   . Fear of Current or Ex-Partner:   . Emotionally Abused:   Marland Kitchen Physically Abused:   . Sexually Abused:    Review of Systems  Constitutional: Negative for chills and fever.  HENT: Negative for congestion and sinus pressure.   Eyes: Negative.   Respiratory: Negative for cough and shortness of breath.   Cardiovascular: Negative for chest pain, palpitations and leg swelling.  Genitourinary: Negative for difficulty urinating, dysuria, frequency, hematuria and pelvic pain.       Noticed a bad odor to the urine since hospital visit in May "smells like a stinky catheter. "   Musculoskeletal:       Knee pain- stable  Skin: Negative for rash.  Neurological: Negative for dizziness, weakness and headaches.  Psychiatric/Behavioral:       No concerns about depression. Chronic worrier.    Objective:   Today's Vitals: BP 128/76 (BP Location: Right Arm, Patient Position: Sitting, Cuff Size: Large)   Pulse 86   Temp 97.8 F (36.6 C) (Oral)    Ht '5\' 7"'  (1.702 m)   Wt (!) 320 lb (145.2 kg)   SpO2 98%   BMI 50.12 kg/m   Physical Exam Vitals reviewed.  Constitutional:      Appearance: She is obese.  HENT:     Head: Normocephalic and atraumatic.  Eyes:     Conjunctiva/sclera: Conjunctivae normal.     Pupils: Pupils are equal, round, and reactive to light.  Cardiovascular:     Rate and Rhythm: Normal rate and regular rhythm.     Pulses: Normal pulses.     Heart sounds: Normal heart sounds.  Pulmonary:     Effort: Pulmonary effort is normal.     Breath sounds: Normal breath sounds. No wheezing or rales.  Abdominal:     Palpations: Abdomen is soft.     Tenderness: There is no abdominal tenderness.  Musculoskeletal:        General: Normal range of motion.     Cervical back: Normal range of motion and neck supple.     Right lower leg: Edema present.     Left lower leg: Edema present.     Comments: Trace bilat pitting edema- seems to be baseline since May.   Skin:    General: Skin is warm and dry.  Neurological:     General: No focal deficit present.     Mental Status: She is alert and oriented to person, place, and time.  Psychiatric:        Mood and Affect: Mood normal.        Behavior: Behavior normal.     Comments: PHQ-9 is 0.  Gad-7 is 5     Assessment & Plan:   Problem List Items Addressed This Visit      Cardiovascular and Mediastinum   Essential hypertension - Primary   Relevant Medications   torsemide (DEMADEX) 10 MG tablet   metoprolol succinate (TOPROL-XL) 25 MG 24 hr tablet   Other Relevant Orders   Comprehensive metabolic panel   Chronic diastolic heart failure (HCC)   Relevant Medications   torsemide (DEMADEX) 10 MG tablet   metoprolol succinate (TOPROL-XL) 25 MG 24 hr tablet     Genitourinary   CKD (chronic kidney disease), stage III   Relevant Orders   CBC with Differential/Platelet  Microalbumin / creatinine urine ratio     Other   Morbid obesity with BMI of 50.0-59.9, adult (HCC)    Relevant Orders   TSH   Prediabetes   Relevant Orders   Hemoglobin A1c   HLD (hyperlipidemia)   Relevant Medications   torsemide (DEMADEX) 10 MG tablet   metoprolol succinate (TOPROL-XL) 25 MG 24 hr tablet   Other Relevant Orders   Lipid panel    Other Visit Diagnoses    Need for hepatitis C screening test       Relevant Orders   Hepatitis C antibody   Anxiety       Relevant Orders   VITAMIN D 25 Hydroxy (Vit-D Deficiency, Fractures)   B12 and Folate Panel      Outpatient Encounter Medications as of 01/04/2020  Medication Sig  . acetaminophen (TYLENOL) 325 MG tablet Take 2 tablets (650 mg total) by mouth every 4 (four) hours as needed for headache or mild pain.  Marland Kitchen albuterol (PROAIR HFA) 108 (90 BASE) MCG/ACT inhaler Inhale 2 puffs into the lungs every 6 (six) hours as needed for wheezing or shortness of breath.  . anastrozole (ARIMIDEX) 1 MG tablet Take 1 tablet (1 mg total) by mouth daily.  Marland Kitchen aspirin 81 MG chewable tablet Chew 81 mg by mouth daily.  . Blood Glucose Monitoring Suppl (ACCU-CHEK GUIDE) w/Device KIT   . cilostazol (PLETAL) 100 MG tablet Take 1 tablet (100 mg total) by mouth 2 (two) times daily.  . diclofenac sodium (VOLTAREN) 1 % GEL Apply 4 g topically 4 (four) times daily as needed (knee and back pain). Knees and back for pain  . Fluticasone-Umeclidin-Vilant (TRELEGY ELLIPTA) 100-62.5-25 MCG/INH AEPB Inhale 1 puff into the lungs daily.  . metoprolol succinate (TOPROL-XL) 25 MG 24 hr tablet Take 0.5 tablets (12.5 mg total) by mouth daily.  . Multiple Vitamins-Minerals (PRESERVISION AREDS 2 PO) Take 1 tablet by mouth daily.   . pantoprazole (PROTONIX) 20 MG tablet Take 20 mg by mouth daily.  . polyethylene glycol (MIRALAX / GLYCOLAX) 17 g packet Take 17 g by mouth daily as needed for mild constipation.  . potassium chloride SA (KLOR-CON) 20 MEQ tablet Take 1 tablet (20 mEq total) by mouth daily.  Marland Kitchen torsemide (DEMADEX) 10 MG tablet Take 1 tablet (10 mg total) by  mouth 2 (two) times daily.  . [DISCONTINUED] metoprolol succinate (TOPROL-XL) 25 MG 24 hr tablet Take 0.5 tablets (12.5 mg total) by mouth daily.  . [DISCONTINUED] potassium chloride SA (KLOR-CON) 20 MEQ tablet Take 1 tablet (20 mEq total) by mouth daily.  . [DISCONTINUED] torsemide (DEMADEX) 10 MG tablet Take 1 tablet (10 mg total) by mouth 2 (two) times daily.  . [DISCONTINUED] capsaicin (ZOSTRIX) 0.025 % cream Apply topically 2 (two) times daily.  . [DISCONTINUED] Tetrahydroz-Dextran-PEG-Povid (EYE DROPS ADVANCED RELIEF) 0.05-0.1-1-1 % SOLN Place 1 drop into both eyes 2 (two) times daily.   . [DISCONTINUED] triamterene-hydrochlorothiazide (MAXZIDE-25) 37.5-25 MG tablet TAKE 1 TABLET BY MOUTH EVERY DAY   No facility-administered encounter medications on file as of 01/04/2020.   First visit to establish care with a new primary care provider today and EPIC was down during her entire visit. I will ask her to come back in for routine laboratory study. Epic recovered and her hospital visits, past physician visits, past labs reviewed.I have reviewed past medical, surgical, medication, allergy, social and family histories today and updated them in Epic where appropriate.   History obtained from her daughter Daisy [name] who was a paramedic  and is a good historian. Daisy [name] has a recent history of multiple medical problems but she seems to be overall stable now.   We  need to work on cardiovascular risk factor modification, preventative health, and medication monitoring.  She is working on strengthening herself, increasing activity slowly.  Morbid obesity is uncontrolled.  We will need to work on diet management next.  She has chronic knee pain, and has to avoid oral NSAIDs.  She is permitted to have topical gel as needed.  She seems to be stable with her pulmonary status and sleep apnea therapy.  Trace edema, at new baseline.  Lungs are CTA.  She has no chest pain, shortness of breath, or DOE.  Her  diastolic heart failure seems to be stable. She is actively followed by cardiology, pulmonology, and oncology.  Unknown at this time is her diabetes status, lipid status, current kidney function, electrolyte status, and GAD-7: 5 is moderate anxiety and will need follow up.   I provided  60 minutes of non-face-to-face time during this encounter reviewing patient's current problems and past procedures/imaging studies, providing counseling on the above mentioned problems , and coordination  of care .  Follow-up: Return in about 1 month (around 02/04/2020).  This visit occurred during the SARS-CoV-2 public health emergency.  Safety protocols were in place, including screening questions prior to the visit, additional usage of staff PPE, and extensive cleaning of exam room while observing appropriate contact time as indicated for disinfecting solutions.   Denice Paradise, NP

## 2020-01-05 ENCOUNTER — Other Ambulatory Visit: Payer: Self-pay

## 2020-01-05 ENCOUNTER — Other Ambulatory Visit: Payer: Self-pay | Admitting: Nurse Practitioner

## 2020-01-05 MED ORDER — POTASSIUM CHLORIDE CRYS ER 20 MEQ PO TBCR: 20.0000 meq | EXTENDED_RELEASE_TABLET | Freq: Every day | ORAL | 1 refills | Status: DC

## 2020-01-05 MED ORDER — TORSEMIDE 10 MG PO TABS: 10.0000 mg | ORAL_TABLET | Freq: Two times a day (BID) | ORAL | 1 refills | Status: DC

## 2020-01-05 MED ORDER — METOPROLOL SUCCINATE ER 25 MG PO TB24: 12.5000 mg | ORAL_TABLET | Freq: Every day | ORAL | 1 refills | Status: DC

## 2020-01-06 ENCOUNTER — Other Ambulatory Visit (INDEPENDENT_AMBULATORY_CARE_PROVIDER_SITE_OTHER): Payer: Medicare PPO

## 2020-01-06 ENCOUNTER — Other Ambulatory Visit: Payer: Self-pay

## 2020-01-06 DIAGNOSIS — N183 Chronic kidney disease, stage 3 unspecified: Secondary | ICD-10-CM

## 2020-01-06 DIAGNOSIS — F419 Anxiety disorder, unspecified: Secondary | ICD-10-CM

## 2020-01-06 DIAGNOSIS — E782 Mixed hyperlipidemia: Secondary | ICD-10-CM | POA: Diagnosis not present

## 2020-01-06 DIAGNOSIS — R7303 Prediabetes: Secondary | ICD-10-CM

## 2020-01-06 DIAGNOSIS — I10 Essential (primary) hypertension: Secondary | ICD-10-CM | POA: Diagnosis not present

## 2020-01-06 DIAGNOSIS — Z1159 Encounter for screening for other viral diseases: Secondary | ICD-10-CM

## 2020-01-06 LAB — CBC WITH DIFFERENTIAL/PLATELET
Basophils Absolute: 0.1 10*3/uL (ref 0.0–0.1)
Basophils Relative: 1 % (ref 0.0–3.0)
Eosinophils Absolute: 0.2 10*3/uL (ref 0.0–0.7)
Eosinophils Relative: 2.8 % (ref 0.0–5.0)
HCT: 39.3 % (ref 36.0–46.0)
Hemoglobin: 13 g/dL (ref 12.0–15.0)
Lymphocytes Relative: 27.6 % (ref 12.0–46.0)
Lymphs Abs: 1.9 10*3/uL (ref 0.7–4.0)
MCHC: 33 g/dL (ref 30.0–36.0)
MCV: 92.8 fl (ref 78.0–100.0)
Monocytes Absolute: 0.6 10*3/uL (ref 0.1–1.0)
Monocytes Relative: 8.6 % (ref 3.0–12.0)
Neutro Abs: 4.2 10*3/uL (ref 1.4–7.7)
Neutrophils Relative %: 60 % (ref 43.0–77.0)
Platelets: 246 10*3/uL (ref 150.0–400.0)
RBC: 4.23 Mil/uL (ref 3.87–5.11)
RDW: 15.6 % — ABNORMAL HIGH (ref 11.5–15.5)
WBC: 7 10*3/uL (ref 4.0–10.5)

## 2020-01-06 LAB — COMPREHENSIVE METABOLIC PANEL
ALT: 11 U/L (ref 0–35)
AST: 12 U/L (ref 0–37)
Albumin: 3.8 g/dL (ref 3.5–5.2)
Alkaline Phosphatase: 96 U/L (ref 39–117)
BUN: 18 mg/dL (ref 6–23)
CO2: 30 mEq/L (ref 19–32)
Calcium: 9.8 mg/dL (ref 8.4–10.5)
Chloride: 102 mEq/L (ref 96–112)
Creatinine, Ser: 1.54 mg/dL — ABNORMAL HIGH (ref 0.40–1.20)
GFR: 33.37 mL/min — ABNORMAL LOW (ref >60.00)
Glucose, Bld: 107 mg/dL — ABNORMAL HIGH (ref 70–99)
Potassium: 4.5 mEq/L (ref 3.5–5.1)
Sodium: 141 mEq/L (ref 135–145)
Total Bilirubin: 0.6 mg/dL (ref 0.2–1.2)
Total Protein: 6.9 g/dL (ref 6.0–8.3)

## 2020-01-06 LAB — LIPID PANEL
Cholesterol: 265 mg/dL — ABNORMAL HIGH (ref 0–200)
HDL: 56.3 mg/dL (ref >39.00)
LDL Cholesterol: 182 mg/dL — ABNORMAL HIGH (ref 0–99)
NonHDL: 208.57
Total CHOL/HDL Ratio: 5
Triglycerides: 133 mg/dL (ref 0.0–149.0)
VLDL: 26.6 mg/dL (ref 0.0–40.0)

## 2020-01-06 LAB — B12 AND FOLATE PANEL
Folate: 8.1 ng/mL (ref >5.9)
Vitamin B-12: 175 pg/mL — ABNORMAL LOW (ref 211–911)

## 2020-01-06 LAB — MICROALBUMIN / CREATININE URINE RATIO
Creatinine,U: 39.3 mg/dL
Microalb Creat Ratio: 1.8 mg/g (ref 0.0–30.0)
Microalb, Ur: <0.7 mg/dL (ref 0.0–1.9)

## 2020-01-06 LAB — HEMOGLOBIN A1C: Hgb A1c MFr Bld: 6.5 % (ref 4.6–6.5)

## 2020-01-09 LAB — HEPATITIS C ANTIBODY
Hepatitis C Ab: NONREACTIVE
SIGNAL TO CUT-OFF: 0.01 (ref <1.00)

## 2020-01-09 NOTE — Addendum Note (Signed)
Addended by: Denice Paradise A on: 01/09/2020 08:30 AM   Modules accepted: Orders

## 2020-01-25 ENCOUNTER — Other Ambulatory Visit: Payer: Self-pay

## 2020-01-27 ENCOUNTER — Other Ambulatory Visit: Payer: Self-pay

## 2020-01-27 ENCOUNTER — Encounter: Payer: Self-pay | Admitting: Nurse Practitioner

## 2020-01-27 ENCOUNTER — Other Ambulatory Visit: Payer: Self-pay | Admitting: Nurse Practitioner

## 2020-01-27 ENCOUNTER — Telehealth (INDEPENDENT_AMBULATORY_CARE_PROVIDER_SITE_OTHER): Payer: Medicare PPO | Admitting: Nurse Practitioner

## 2020-01-27 VITALS — HR 81 | Ht 67.0 in | Wt 324.6 lb

## 2020-01-27 DIAGNOSIS — R609 Edema, unspecified: Secondary | ICD-10-CM | POA: Diagnosis not present

## 2020-01-27 DIAGNOSIS — I10 Essential (primary) hypertension: Secondary | ICD-10-CM

## 2020-01-27 DIAGNOSIS — I5032 Chronic diastolic (congestive) heart failure: Secondary | ICD-10-CM

## 2020-01-27 DIAGNOSIS — Z6841 Body Mass Index (BMI) 40.0 and over, adult: Secondary | ICD-10-CM

## 2020-01-27 DIAGNOSIS — N1832 Chronic kidney disease, stage 3b: Secondary | ICD-10-CM

## 2020-01-27 MED ORDER — POTASSIUM CHLORIDE CRYS ER 20 MEQ PO TBCR: 20.0000 meq | EXTENDED_RELEASE_TABLET | Freq: Every day | ORAL | 0 refills | Status: DC

## 2020-01-27 MED ORDER — PANTOPRAZOLE SODIUM 20 MG PO TBEC: 20.0000 mg | DELAYED_RELEASE_TABLET | Freq: Every day | ORAL | 0 refills | Status: DC

## 2020-01-27 MED ORDER — ALBUTEROL SULFATE HFA 108 (90 BASE) MCG/ACT IN AERS: 2.0000 | INHALATION_SPRAY | Freq: Four times a day (QID) | RESPIRATORY_TRACT | 3 refills | Status: DC | PRN

## 2020-01-27 MED ORDER — METOPROLOL SUCCINATE ER 25 MG PO TB24: 12.5000 mg | ORAL_TABLET | Freq: Every day | ORAL | 0 refills | Status: DC

## 2020-01-27 NOTE — Patient Instructions (Addendum)
Follow up today with weight gain and 2 + pitting edema lower legs.    PLAN:  1. Please increase the torsemide (Demadex)  from 10 mg up to 20 mg daily for just one week.   2. Please try knee high compression hose in the morning and take them off at bedtime. The prescription strength is 20-30 mm HG, but must be measured and fit properly.   3. Elevate legs while in recliner.   4. Please go to [facility] next FRI  02/03/2020 for Bmet - lab check to kidneys and electrolytes.    5. Continue to weigh daily, first thing in the morning before eating/drinking and record.  6.  If the weight and swelling do not decrease, please call me back.     Edema  Edema is an abnormal buildup of fluids in the body tissues and under the skin. Swelling of the legs, feet, and ankles is a common symptom that becomes more likely as you get older. Swelling is also common in looser tissues, like around the eyes. When the affected area is squeezed, the fluid may move out of that spot and leave a dent for a few moments. This dent is called pitting edema. There are many possible causes of edema. Eating too much salt (sodium) and being on your feet or sitting for a long time can cause edema in your legs, feet, and ankles. Hot weather may make edema worse. Common causes of edema include:  Heart failure.  Liver or kidney disease.  Weak leg blood vessels.  Cancer.  An injury.  Pregnancy.  Medicines.  Being obese.  Low protein levels in the blood. Edema is usually painless. Your skin may look swollen or shiny. Follow these instructions at home:  Keep the affected body part raised (elevated) above the level of your heart when you are sitting or lying down.  Do not sit still or stand for long periods of time.  Do not wear tight clothing. Do not wear garters on your upper legs.  Exercise your legs to get your circulation going. This helps to move the fluid back into your blood vessels, and it may help the  swelling go down.  Wear elastic bandages or support stockings to reduce swelling as told by your health care provider.  Eat a low-salt (low-sodium) diet to reduce fluid as told by your health care provider.  Depending on the cause of your swelling, you may need to limit how much fluid you drink (fluid restriction).  Take over-the-counter and prescription medicines only as told by your health care provider. Contact a health care provider if:  Your edema does not get better with treatment.  You have heart, liver, or kidney disease and have symptoms of edema.  You have sudden and unexplained weight gain. Get help right away if:  You develop shortness of breath or chest pain.  You cannot breathe when you lie down.  You develop pain, redness, or warmth in the swollen areas.  You have heart, liver, or kidney disease and suddenly get edema.  You have a fever and your symptoms suddenly get worse. Summary  Edema is an abnormal buildup of fluids in the body tissues and under the skin.  Eating too much salt (sodium) and being on your feet or sitting for a long time can cause edema in your legs, feet, and ankles.  Keep the affected body part raised (elevated) above the level of your heart when you are sitting or lying down. This  information is not intended to replace advice given to you by your health care provider. Make sure you discuss any questions you have with your health care provider. Document Revised: 10/20/2018 Document Reviewed: 07/05/2016 Elsevier Patient Education  [place].  Chronic Kidney Disease, Adult Chronic kidney disease (CKD) occurs when the kidneys become damaged slowly over a long period of time. The kidneys are a pair of organs that do many important jobs in the body, including:  Removing waste and extra fluid from the blood to make urine.  Making hormones that maintain the amount of fluid in tissues and blood vessels.  Maintaining the right amount of  fluids and chemicals in the body. A small amount of kidney damage may not cause problems, but a large amount of damage may make it hard or impossible for the kidneys to work the way they should. If steps are not taken to slow down kidney damage or to stop it from getting worse, the kidneys may stop working permanently (end-stage renal disease or ESRD). Most of the time, CKD does not go away, but it can often be controlled. People who have CKD are usually able to live normal lives. What are the causes? The most common causes of this condition are diabetes and high blood pressure (hypertension). Other causes include:  Heart and blood vessel (cardiovascular) disease.  Kidney diseases, such as: ? Glomerulonephritis. ? Interstitial nephritis. ? Polycystic kidney disease. ? Renal vascular disease.  Diseases that affect the immune system.  Genetic diseases.  Medicines that damage the kidneys, such as anti-inflammatory medicines.  Being around or being in contact with poisonous (toxic) substances.  A kidney or urinary infection that occurs again and again (recurs).  Vasculitis. This is swelling or inflammation of the blood vessels.  A problem with urine flow that may be caused by: ? Cancer. ? Having kidney stones more than one time. ? An enlarged prostate, in males. What increases the risk? You are more likely to develop this condition if you:  Are older than age 37.  Are female.  Are African-American, Hispanic, Asian, [place], or American [place].  Are a current or former smoker.  Are obese.  Have a family history of kidney disease or failure.  Often take medicines that are damaging to the kidneys. What are the signs or symptoms? Symptoms of this condition include:  Swelling (edema) of the face, legs, ankles, or feet.  Tiredness (lethargy) and having less energy.  Nausea or vomiting.  Confusion or trouble concentrating.  Problems with urination, such  as: ? Painful or burning feeling during urination. ? Decreased urine production. ? Frequent urination, especially at night. ? Bloody urine.  Muscle twitches and cramps, especially in the legs.  Shortness of breath.  Weakness.  Loss of appetite.  Metallic taste in the mouth.  Trouble sleeping.  Dry, itchy skin.  A low blood count (anemia).  Pale lining of the eyelids and surface of the eye (conjunctiva). Symptoms develop slowly and may not be obvious until the kidney damage becomes severe. It is possible to have kidney disease for years without having any symptoms. How is this diagnosed? This condition may be diagnosed based on:  Blood tests.  Urine tests.  Imaging tests, such as an ultrasound or CT scan.  A test in which a sample of tissue is removed from the kidneys to be examined under a microscope (kidney biopsy). These test results will help your health care provider determine how serious the CKD is. How is  this treated? There is no cure for most cases of this condition, but treatment usually relieves symptoms and prevents or slows the progression of the disease. Treatment may include:  Making diet changes, which may require you to avoid alcohol, salty foods (sodium), and foods that are high in potassium, calcium, and protein.  Medicines: ? To lower blood pressure. ? To control blood glucose. ? To relieve anemia. ? To relieve swelling. ? To protect your bones. ? To improve the balance of electrolytes in your blood.  Removing toxic waste from the body through types of dialysis, if the kidneys can no longer do their job (kidney failure).  Managing any other conditions that are causing your CKD or making it worse. Follow these instructions at home: Medicines  Take over-the-counter and prescription medicines only as told by your health care provider. The dose of some medicines that you take may need to be adjusted.  Do not take any new medicines unless approved  by your health care provider. Many medicines can worsen your kidney damage.  Do not take any vitamin and mineral supplements unless approved by your health care provider. Many nutritional supplements can worsen your kidney damage. General instructions  Follow your prescribed diet as told by your health care provider.  Do not use any products that contain nicotine or tobacco, such as cigarettes and e-cigarettes. If you need help quitting, ask your health care provider.  Monitor and track your blood pressure at home. Report changes in your blood pressure as told by your health care provider.  If you are being treated for diabetes, monitor and track your blood sugar (blood glucose) levels as told by your health care provider.  Maintain a healthy weight. If you need help with this, ask your health care provider.  Start or continue an exercise plan. Exercise at least 30 minutes a day, 5 days a week.  Keep your immunizations up to date as told by your health care provider.  Keep all follow-up visits as told by your health care provider. This is important. Where to find more information  American Association of Kidney Patients: BombTimer.gl  National Kidney Foundation: www.kidney.[place]: https://mathis.com/  [facility]: www.lifeoptions.org and www.kidneyschool.org Contact a health care provider if:  Your symptoms get worse.  You develop new symptoms. Get help right away if:  You develop symptoms of ESRD, which include: ? Headaches. ? Numbness in the hands or feet. ? Easy bruising. ? Frequent hiccups. ? Chest pain. ? Shortness of breath. ? Lack of menstruation, in women.  You have a fever.  You have decreased urine production.  You have pain or bleeding when you urinate. Summary  Chronic kidney disease (CKD) occurs when the kidneys become damaged slowly over a long period of time.  The most common causes of this condition are  diabetes and high blood pressure (hypertension).  There is no cure for most cases of this condition, but treatment usually relieves symptoms and prevents or slows the progression of the disease. Treatment may include a combination of medicines and lifestyle changes. This information is not intended to replace advice given to you by your health care provider. Make sure you discuss any questions you have with your health care provider. Document Revised: 05/15/2017 Document Reviewed: 07/10/2016 Elsevier Patient Education  2020 [employer].

## 2020-01-27 NOTE — Progress Notes (Signed)
Virtual Visit via Virtual Note  This visit type was conducted due to national recommendations for restrictions regarding the COVID-19 pandemic (e.g. social distancing).  This format is felt to be most appropriate for this patient at this time.  All issues noted in this document were discussed and addressed.  No physical exam was performed (except for noted visual exam findings with Video Visits).   I connected with@ on 01/27/20 at 10:30 AM EDT by a video enabled telemedicine application or telephone and verified that I am speaking with the correct person using two identifiers. Location patient: home Location provider: work or home office Persons participating in the virtual visit: patient, Daisy [name]- DTR, provider  I discussed the limitations, risks, security and privacy concerns of performing an evaluation and management service by telephone and the availability of in person appointments. I also discussed with the patient that there may be a patient responsible charge related to this service. The patient expressed understanding and agreed to proceed.   Reason for visit: Has swelling lower legs/feet  HPI: Daisy [name] is a 69 year old patient who was hospitalized in May for fatigue, DOE, hypercarbia, BNP 769  secondary to decompensated diastolic heart failure in setting of untreated OSA.  Follow-up pulmonary study showed only mild sleep apnea.  She continues to use BiPAP with O2 therapy at night.  She was treated initially with IV furosemide changed to torsemide 10 mg twice daily.  History of chronic leg edema and venous stasis discharge close to her baseline.   Today, she reports she  is doing OK but she  has been exposed to her DTR who has cold sx and is waiting for a Covid test result. Daisy [name] has  no CP or SOB. No cough. She elevates head a little bit at night, but can lay flat.  She is using Bipap with O2 at 2L/[place]. Positive pitting lower extremity edema is worsening. Wt is up 4 lb in 2 days and then  slowly increased over the last weeks up to a total of 10 lbs over 1.5 weeks. She is taking the torsemide 10 mg twice daily as directed.  She is cut back on salt.  She tries to elevate the leg whenever she sitting in her recliner.  She thinks the Lasix works better for her than the torsemide that she was given on discharge.    BUN 18/Cr 1.54 GFR 33.37 CKD 3. She has an appt with [facility] 03/14/2020. Lipids: chol 265- LDL 182- all trying to eat heat healthy diet and the whole household is working on this.  Neg  micro albumin    Wt Readings from Last 3 Encounters:  01/27/20 (!) 324 lb 9.6 oz (147.2 kg)  01/04/20 (!) 320 lb (145.2 kg)  12/31/19 (!) 318 lb (144.2 kg)    BP Readings from Last 3 Encounters:  01/04/20 128/76  11/30/19 110/68  11/07/19 123/60   HLD: She was on a case study for lipids that was not a statin as her lipids normalized. Cardiology:  Dr. Einar Gip early Sept .   Lab Results  Component Value Date   CHOL 265 (H) 01/06/2020   HDL 56.30 01/06/2020   LDLCALC 182 (H) 01/06/2020   TRIG 133.0 01/06/2020   CHOLHDL 5 01/06/2020    B12:  Low B12 175 mg on 01/06/20: She is taking B12 OTC 500 mg -x3  Daily. Her DTR is living with  her and retired paramedic and can give B12 shots.  ROS: See pertinent positives and negatives per HPI.  Past Medical History:  Diagnosis Date  . Acid reflux   . Acute heart failure ([place]) 10/17/2019  . Arthritis   . Breast cancer of upper-outer quadrant of right female breast ([place]) 11/19/2016  . Chicken pox   . Cholecystitis   . Chronic diastolic heart failure ([place]) 01/04/2020  . CKD (chronic kidney disease), stage III 01/04/2020  . Depression   . Dyspnea   . History of radiation therapy 01/20/17-03/02/17   left breast was treated to 50.4 Gy in 18 fractions  . HLD (hyperlipidemia) 01/04/2020  . Hyperlipemia   . Irregular heart rate   . Malignant neoplasm of upper-outer quadrant of left breast in female, estrogen receptor positive ([place]) 09/30/2016   . Morbid obesity with BMI of 50.0-59.9, adult ([place]) 01/04/2020  . Obesity   . Peripheral vascular disease ([place])   . Pneumonia   . Prediabetes 01/04/2020  . Urinary tract infection     Past Surgical History:  Procedure Laterality Date  . ABDOMINAL HYSTERECTOMY    . BREAST LUMPECTOMY Left 11/19/2016   BREAST LUMPECTOMY WITH RADIOACTIVE SEED AND SENTINEL LYMPH NODE BIOPSY (Left)  . BREAST LUMPECTOMY WITH RADIOACTIVE SEED AND SENTINEL LYMPH NODE BIOPSY Left 11/19/2016   Procedure: BREAST LUMPECTOMY WITH RADIOACTIVE SEED AND SENTINEL LYMPH NODE BIOPSY;  Surgeon: Rolm Bookbinder, MD;  Location: [facility];  Service: General;  Laterality: Left;  . CESAREAN SECTION    . CESAREAN SECTION     x 2  . CHOLECYSTECTOMY  2007  . KNEE ARTHROSCOPY     bilateral  . KNEE SURGERY Bilateral   . LOWER EXTREMITY ANGIOGRAM N/A 04/04/2014   Procedure: LOWER EXTREMITY ANGIOGRAM;  Surgeon: Laverda Page, MD;  Location[facility] CATH LAB;  Service: Cardiovascular;  Laterality: N/A;  . OVARIAN CYST REMOVAL    . RE-EXCISION OF BREAST LUMPECTOMY Left 12/12/2016   Procedure: RE-EXCISION OF LEFT BREAST LUMPECTOMY;  Surgeon: Rolm Bookbinder, MD;  Location: [facility];  Service: General;  Laterality: Left;  . ROTATOR CUFF REPAIR Right   . TONSILLECTOMY    . WRIST SURGERY Bilateral     Family History  Problem Relation Age of Onset  . Heart disease Mother   . Lung cancer Father   . Heart disease Father   . Heart disease Brother     SOCIAL HX: former smoker quit 11/02/2019 30 pack years   Current Outpatient Medications:  .  acetaminophen (TYLENOL) 325 MG tablet, Take 2 tablets (650 mg total) by mouth every 4 (four) hours as needed for headache or mild pain., Disp:  , Rfl:  .  albuterol (PROAIR HFA) 108 (90 Base) MCG/ACT inhaler, Inhale 2 puffs into the lungs every 6 (six) hours as needed for wheezing or shortness of breath., Disp: 18 g, Rfl: 3 .  anastrozole (ARIMIDEX) 1 MG tablet, Take 1 tablet (1 mg total) by mouth  daily., Disp: 90 tablet, Rfl: 4 .  aspirin 81 MG chewable tablet, Chew 81 mg by mouth daily., Disp: , Rfl:  .  Blood Glucose Monitoring Suppl (ACCU-CHEK GUIDE) w/Device KIT, , Disp: , Rfl:  .  cilostazol (PLETAL) 100 MG tablet, Take 1 tablet (100 mg total) by mouth 2 (two) times daily., Disp: 180 tablet, Rfl: 3 .  diclofenac sodium (VOLTAREN) 1 % GEL, Apply 4 g topically 4 (four) times daily as needed (knee and back pain). Knees and back for pain, Disp: , Rfl: 5 .  Fluticasone-Umeclidin-Vilant (TRELEGY ELLIPTA) 100-62.5-25 MCG/INH AEPB, Inhale 1 puff into the lungs daily., Disp: 60 each,  Rfl: 5 .  metoprolol succinate (TOPROL-XL) 25 MG 24 hr tablet, Take 0.5 tablets (12.5 mg total) by mouth daily., Disp: 90 tablet, Rfl: 0 .  Multiple Vitamins-Minerals (PRESERVISION AREDS 2 PO), Take 1 tablet by mouth daily. , Disp: , Rfl:  .  pantoprazole (PROTONIX) 20 MG tablet, Take 1 tablet (20 mg total) by mouth daily., Disp: 90 tablet, Rfl: 0 .  vitamin B-12 (CYANOCOBALAMIN) 500 MCG tablet, Take 500 mcg by mouth daily. Takes three tabs daily, Disp: , Rfl:  .  potassium chloride SA (KLOR-CON M20) 20 MEQ tablet, Take 1 tablet (20 mEq total) by mouth daily., Disp: 90 tablet, Rfl: 0 .  torsemide (DEMADEX) 10 MG tablet, TAKE 1 TABLET BY MOUTH TWICE A DAY, Disp: 60 tablet, Rfl: 1  EXAM:  VITALS per patient if applicable: Weight 121, pulse 81, blood pressure not available, pulse oximetry  94% at rest  GENERAL: alert, oriented, appears well and in no acute distress.  No breathiness with speaking.  Speaks in complete sentences.  Appears comfortable.  HEENT: atraumatic, conjunctiva clear, no obvious abnormalities on inspection of external nose and ears  NECK: normal movements of the head and neck  LUNGS: on inspection no signs of respiratory distress, breathing rate appears normal, no obvious gross SOB, gasping or wheezing  CV: no obvious cyanosis  MS: moves all visible extremities without noticeable  abnormality.  Able to see her lower extremities and she has 2+ pitting edema now.  This is greater than when she came in July.  PSYCH/NEURO: pleasant and cooperative, no obvious depression or anxiety, speech and thought processing grossly intact  ASSESSMENT AND PLAN:  Discussed the following assessment and plan:  Edema, unspecified type - Plan: Basic metabolic panel  Morbid obesity with BMI of 50.0-59.9, adult (HCC)  Stage 3b chronic kidney disease  Chronic diastolic heart failure ([place]) - Plan: Basic metabolic panel  No problem-specific Assessment & Plan notes found for this encounter.  Follow up today with weight gain and 2 + pitting edema lower legs.    PLAN:  1. Please increase the torsemide (Demadex)  from 10 mg up to 20 mg daily for just one week.   2. Please try knee high compression hose in the morning and take them off at bedtime. The prescription strength is 20-30 mm HG, but must be measured and fit properly.   3. Elevate legs while in recliner.   4. Please go to [facility] next FRI  02/03/2020 for Bmet - lab check to kidneys and electrolytes.    5. Continue to weigh daily, first thing in the morning before eating/drinking and record.  6.  If the weight and swelling do not decrease, please call me back.   7. Look into B12 injections to be given at home in place of oral replacement.   I discussed the assessment and treatment plan with the patient. The patient was provided an opportunity to ask questions and all were answered. The patient agreed with the plan and demonstrated an understanding of the instructions.   The patient was advised to call back or seek an in-person evaluation if the symptoms worsen or if the condition fails to improve as anticipated.  I provided 22 minutes of non-face-to-face time during this encounter. Denice Paradise, NP Adult Nurse Practitioner [facility] [phone]

## 2020-01-27 NOTE — Telephone Encounter (Signed)
I spoke to her DTR [name] and will increase her diuretic to 20 mg daily for one week only and get labs next FRI - if able. May be a few days late, but understands the importance. Daisy [name]

## 2020-01-29 ENCOUNTER — Encounter: Payer: Self-pay | Admitting: Nurse Practitioner

## 2020-01-29 ENCOUNTER — Other Ambulatory Visit: Payer: Self-pay | Admitting: Nurse Practitioner

## 2020-01-29 DIAGNOSIS — E538 Deficiency of other specified B group vitamins: Secondary | ICD-10-CM

## 2020-01-29 NOTE — Assessment & Plan Note (Signed)
Normotensive 

## 2020-01-29 NOTE — Telephone Encounter (Signed)
I would like her to get B12 injections at home weekly x 4 and them monthly with x3 months with B12 level to follow. Her DTR is retired Audiological scientist and can give them. Does she need any instruction?   1. Advise that she stop the oral B12 - taking 1500 mcg daily.   2. Please arrange the B12 home injections.

## 2020-01-31 NOTE — Telephone Encounter (Signed)
Please ask one of your co-workers or [employer].

## 2020-01-31 NOTE — Telephone Encounter (Signed)
Did you send in an rx for this? I have no idea how to arrange this

## 2020-02-01 MED ORDER — CYANOCOBALAMIN 1000 MCG/ML IJ SOLN
1000.0000 ug | INTRAMUSCULAR | 2 refills | Status: DC
Start: 2020-02-01 — End: 2020-02-23

## 2020-02-01 NOTE — Telephone Encounter (Signed)
Can you help me with this later today please. I'm not sure what to do here.

## 2020-02-08 ENCOUNTER — Other Ambulatory Visit: Payer: Self-pay | Admitting: Oncology

## 2020-02-09 ENCOUNTER — Other Ambulatory Visit: Payer: Self-pay

## 2020-02-09 ENCOUNTER — Ambulatory Visit (INDEPENDENT_AMBULATORY_CARE_PROVIDER_SITE_OTHER): Payer: Medicare PPO | Admitting: Pulmonary Disease

## 2020-02-09 ENCOUNTER — Encounter: Payer: Self-pay | Admitting: Pulmonary Disease

## 2020-02-09 ENCOUNTER — Ambulatory Visit: Payer: Medicare PPO | Admitting: Pulmonary Disease

## 2020-02-09 DIAGNOSIS — J9612 Chronic respiratory failure with hypercapnia: Secondary | ICD-10-CM

## 2020-02-09 DIAGNOSIS — G4733 Obstructive sleep apnea (adult) (pediatric): Secondary | ICD-10-CM

## 2020-02-09 DIAGNOSIS — J449 Chronic obstructive pulmonary disease, unspecified: Secondary | ICD-10-CM | POA: Diagnosis not present

## 2020-02-09 DIAGNOSIS — Z6841 Body Mass Index (BMI) 40.0 and over, adult: Secondary | ICD-10-CM

## 2020-02-09 LAB — PULMONARY FUNCTION TEST
DL/VA % pred: 103 %
DL/VA: 4.21 ml/min/mmHg/L
DLCO cor % pred: 96 %
DLCO cor: 20.15 ml/min/mmHg
DLCO unc % pred: 95 %
DLCO unc: 19.9 ml/min/mmHg
FEF 25-75 Post: 1.43 L/sec
FEF 25-75 Pre: 1.32 L/sec
FEF2575-%Change-Post: 8 %
FEF2575-%Pred-Post: 69 %
FEF2575-%Pred-Pre: 63 %
FEV1-%Change-Post: 1 %
FEV1-%Pred-Post: 72 %
FEV1-%Pred-Pre: 71 %
FEV1-Post: 1.79 L
FEV1-Pre: 1.77 L
FEV1FVC-%Change-Post: 5 %
FEV1FVC-%Pred-Pre: 97 %
FEV6-%Change-Post: -4 %
FEV6-%Pred-Post: 72 %
FEV6-%Pred-Pre: 75 %
FEV6-Post: 2.28 L
FEV6-Pre: 2.38 L
FEV6FVC-%Pred-Post: 104 %
FEV6FVC-%Pred-Pre: 104 %
FVC-%Change-Post: -4 %
FVC-%Pred-Post: 69 %
FVC-%Pred-Pre: 72 %
FVC-Post: 2.28 L
FVC-Pre: 2.38 L
Post FEV1/FVC ratio: 79 %
Post FEV6/FVC ratio: 100 %
Pre FEV1/FVC ratio: 74 %
Pre FEV6/FVC Ratio: 100 %

## 2020-02-09 NOTE — Progress Notes (Signed)
Full PFT performed today. °

## 2020-02-09 NOTE — Assessment & Plan Note (Signed)
Surprisingly not significant airway obstruction on PFTs.  No significant sleep apnea on sleep study.  He she likely has obesity hypoventilation however and will require NIV.  Perhaps BiPAP would be: To decrease cost. We will check download on trilogy machine and switch her to equal on BiPAP machine

## 2020-02-09 NOTE — Progress Notes (Signed)
   Subjective:    Patient ID: Daisy [name], female    DOB: 08/07/1950, 69 y.o.   MRN: [id number]  HPI  69 year old smoker for FU COPD, OSA and acute on chronic hypercarbic and hypoxic respiratory failure  She smokes starting in her 57s until the point of admission, more than 40 pack years Admitted 5/3-10/23/19 with working diagnosis of decompensated diastolic heart failure and cor pulmonale in the setting of untreated OSA/ OHS & ?  Underlying COPD  ABG 5/4 was 1.1 7/108/89 -She was initially diuresed and treated with BiPAP , was discharged to [facility] home but readmitted since BiPAP could not be supplied, she was readmitted 5/19-5/24 and eventually discharged on trilogy machine with full facemask  ABG closer to discharge was 7.3 03/09/25  Chronic diastolic CHF Does not tolerate Entresto - continue beta-blocker -discharged on Demadex and pedal edema appears improved   PMH - Morbid obesity, chronic dyspnea, left breast cancer status post radiation, peripheral vascular disease, depression, arthritis, acid reflux disease   Labs bicarbonate 30 12/2019 Significant tests/ events reviewed  NPSG 12/2019 >> no sig OSA , mild O2 desatn 82% lowest  PFTs 01/2020 mild restriction, TLC 80%, ratio 74, FVC 72%, FEV1 70%, DLCO 95%  09/2019: Ejection fraction 65% with possible RV failure and indeterminate diastolic dysfunction.  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 Also denies presyncope, palpitations, heartburn, abdominal pain, nausea, vomiting, diarrhea or change in bowel or  urinary habits, dysuria,hematuria, rash, arthralgias, visual complaints, headache, numbness weakness or ataxia.     Objective:   Physical Exam  Gen. Pleasant, obese, in no distress ENT - no lesions, no post nasal drip Neck: No JVD, no thyromegaly, no carotid bruits Lungs: no use of accessory muscles, no dullness to percussion, decreased without rales or rhonchi  Cardiovascular: Rhythm regular, heart sounds  normal, no murmurs or gallops, no peripheral edema Musculoskeletal: No deformities, no cyanosis or clubbing , no tremors       Assessment & Plan:

## 2020-02-09 NOTE — Patient Instructions (Signed)
Stay on Anoro for now. PFTs showed lung function at 70% Sleep study surprisingly did not show sleep apnea Stay on trilogy machine, I will attempt to obtain BiPAP machine for you to decrease cost

## 2020-02-09 NOTE — Assessment & Plan Note (Signed)
Surprisingly not significant airway obstruction. Stay on Anoro for now

## 2020-02-14 NOTE — Addendum Note (Signed)
Addended by: Sherrilee Gilles B on: 02/14/2020 09:44 AM   Modules accepted: Orders

## 2020-02-14 NOTE — Assessment & Plan Note (Signed)
Weight loss encouraged

## 2020-02-14 NOTE — Addendum Note (Signed)
Addended by: Sherrilee Gilles B on: 02/14/2020 09:42 AM   Modules accepted: Orders

## 2020-02-15 ENCOUNTER — Telehealth: Payer: Self-pay | Admitting: Pulmonary Disease

## 2020-02-15 LAB — BASIC METABOLIC PANEL
BUN/Creatinine Ratio: 12 (ref 12–28)
BUN: 17 mg/dL (ref 8–27)
CO2: 25 mmol/L (ref 20–29)
Calcium: 9.6 mg/dL (ref 8.7–10.3)
Chloride: 102 mmol/L (ref 96–106)
Creatinine, Ser: 1.45 mg/dL — ABNORMAL HIGH (ref 0.57–1.00)
GFR calc Af Amer: 42 mL/min/{1.73_m2} — ABNORMAL LOW (ref >59)
GFR calc non Af Amer: 37 mL/min/{1.73_m2} — ABNORMAL LOW (ref >59)
Glucose: 96 mg/dL (ref 65–99)
Potassium: 4.8 mmol/L (ref 3.5–5.2)
Sodium: 143 mmol/L (ref 134–144)

## 2020-02-15 LAB — B12 AND FOLATE PANEL
Folate: 3.8 ng/mL (ref >3.0)
Vitamin B-12: 414 pg/mL (ref 232–1245)

## 2020-02-15 NOTE — Telephone Encounter (Signed)
Rigoberto Noel, MD  02/13/2020 2:24 PM EDT     Mild restriction COPD is not significant If Anoro has helped she can stay on this medication otherwise discontinue   Called and spoke with pt letting her know the info stated by RA and she verbalized understanding. Pt stated that she cannot tell if the Anoro has been making any difference so she said she will probably stop taking it. Nothing further needed.

## 2020-02-17 ENCOUNTER — Telehealth: Payer: Self-pay | Admitting: Nurse Practitioner

## 2020-02-17 ENCOUNTER — Telehealth: Payer: Self-pay | Admitting: Pulmonary Disease

## 2020-02-17 NOTE — Telephone Encounter (Signed)
Patient was returning call for results 

## 2020-02-17 NOTE — Telephone Encounter (Signed)
Did not see fax in RA's folder.  Will await fax.

## 2020-02-22 ENCOUNTER — Other Ambulatory Visit: Payer: Self-pay | Admitting: Nurse Practitioner

## 2020-02-23 ENCOUNTER — Ambulatory Visit: Payer: Medicare Other | Admitting: Cardiology

## 2020-02-23 ENCOUNTER — Other Ambulatory Visit: Payer: Self-pay

## 2020-02-23 ENCOUNTER — Encounter: Payer: Self-pay | Admitting: Cardiology

## 2020-02-23 VITALS — BP 138/76 | HR 91 | Resp 16 | Ht 66.0 in | Wt 329.4 lb

## 2020-02-23 DIAGNOSIS — I5032 Chronic diastolic (congestive) heart failure: Secondary | ICD-10-CM

## 2020-02-23 DIAGNOSIS — R0989 Other specified symptoms and signs involving the circulatory and respiratory systems: Secondary | ICD-10-CM

## 2020-02-23 DIAGNOSIS — E7800 Pure hypercholesterolemia, unspecified: Secondary | ICD-10-CM

## 2020-02-23 DIAGNOSIS — I739 Peripheral vascular disease, unspecified: Secondary | ICD-10-CM

## 2020-02-23 MED ORDER — NEXLIZET 180-10 MG PO TABS: 1.0000 | ORAL_TABLET | Freq: Every day | ORAL | 6 refills | Status: DC

## 2020-02-23 NOTE — Progress Notes (Signed)
Primary Physician/Referring:  Marval Regal, NP  Patient ID: Daisy [name], female    DOB: 11-22-1950, 69 y.o.   MRN: [id number]  Chief Complaint  Patient presents with  . Hypertension  . Claudication  . Follow-up    1 year   HPI: Daisy [name]  is a 69 y.o. female  with peripheral arterial disease and claudication. She underwent peripheral arteriogram on 04/04/2014 and successful revascularization of chronically occluded left SFA, right lower extremity was not studied. Her past medical history significant for GERD, macular degeneration, morbid obesity, hyperlipidemia. Diagnosed with breast cancer and underwent left upper quadrant lumpectomy 2 without periprocedural cardiac complications on 7/82/9562 and is S/P RT.   Patient was recently hospitalized in May 2021 with pneumonia, respiratory failure with hypercarbia, and decompensated diastolic heart failure.  She presents today for annual follow-up of chronic diastolic heart failure and peripheral artery disease.  Continues to have stable exertional dyspnea as well as claudication symptoms.  Since her hospitalization patient has been following with pulmonology as well as nephrology. She is continuing to focus on improving her diet and maintaining weight loss.   Past Medical History:  Diagnosis Date  . Acid reflux   . Acute heart failure ([place]) 10/17/2019  . AKI (acute kidney injury) ([place]) 10/17/2019  . Arthritis   . Breast cancer of upper-outer quadrant of right female breast ([place]) 11/19/2016  . Chicken pox   . Cholecystitis   . Chronic diastolic heart failure ([place]) 01/04/2020  . CKD (chronic kidney disease), stage III 01/04/2020  . Depression   . Dyspnea   . History of radiation therapy 01/20/17-03/02/17   left breast was treated to 50.4 Gy in 18 fractions  . HLD (hyperlipidemia) 01/04/2020  . Hyperlipemia   . Irregular heart rate   . Malignant neoplasm of upper-outer quadrant of left breast in female, estrogen receptor  positive ([place]) 09/30/2016  . Morbid obesity with BMI of 50.0-59.9, adult ([place]) 01/04/2020  . Obesity   . Peripheral vascular disease ([place])   . Pneumonia   . Prediabetes 01/04/2020  . Urinary tract infection   . Urinary tract infection with hematuria     Past Surgical History:  Procedure Laterality Date  . ABDOMINAL HYSTERECTOMY    . BREAST LUMPECTOMY Left 11/19/2016   BREAST LUMPECTOMY WITH RADIOACTIVE SEED AND SENTINEL LYMPH NODE BIOPSY (Left)  . BREAST LUMPECTOMY WITH RADIOACTIVE SEED AND SENTINEL LYMPH NODE BIOPSY Left 11/19/2016   Procedure: BREAST LUMPECTOMY WITH RADIOACTIVE SEED AND SENTINEL LYMPH NODE BIOPSY;  Surgeon: Rolm Bookbinder, MD;  Location: [facility];  Service: General;  Laterality: Left;  . CESAREAN SECTION    . CESAREAN SECTION     x 2  . CHOLECYSTECTOMY  2007  . KNEE ARTHROSCOPY     bilateral  . KNEE SURGERY Bilateral   . LOWER EXTREMITY ANGIOGRAM N/A 04/04/2014   Procedure: LOWER EXTREMITY ANGIOGRAM;  Surgeon: Laverda Page, MD;  Location: [facility] CATH LAB;  Service: Cardiovascular;  Laterality: N/A;  . OVARIAN CYST REMOVAL    . RE-EXCISION OF BREAST LUMPECTOMY Left 12/12/2016   Procedure: RE-EXCISION OF LEFT BREAST LUMPECTOMY;  Surgeon: Rolm Bookbinder, MD;  Location: [place];  Service: General;  Laterality: Left;  . ROTATOR CUFF REPAIR Right   . TONSILLECTOMY    . WRIST SURGERY Bilateral    Social History   Tobacco Use  . Smoking status: Former Smoker    Packs/day: 0.50    Years: 30.00    Pack years: 15.00  Types: E-cigarettes    Quit date: 11/02/2019    Years since quitting: 0.3  . Smokeless tobacco: Never Used  Substance Use Topics  . Alcohol use: No   Marital Status: Single  Review of Systems  Constitutional: Negative for chills, decreased appetite, malaise/fatigue and weight gain.  Cardiovascular: Positive for dyspnea on exertion (chronic, stable). Negative for leg swelling and syncope.  Endocrine: Negative for cold intolerance.    Hematologic/Lymphatic: Does not bruise/bleed easily.  Musculoskeletal: Positive for back pain and joint pain (worst in right knee). Negative for joint swelling.  Gastrointestinal: Negative for abdominal pain, anorexia, change in bowel habit, hematochezia and melena.  Neurological: Negative for headaches and light-headedness.  Psychiatric/Behavioral: Negative for substance abuse.  All other systems reviewed and are negative.     Objective  Blood pressure 138/76, pulse 91, resp. rate 16, height _0  (1.676 m), weight (!) 329 lb 6.4 oz (149.4 kg), SpO2 (!) 85 %. Body mass index is 53.17 kg/m.    Physical Exam Constitutional:      General: She is not in acute distress.    Appearance: She is well-developed.     Comments: Morbidly obese  Neck:     Thyroid: No thyromegaly.     Comments: Short neck and difficult to evaluate JVP Cardiovascular:     Rate and Rhythm: Normal rate and regular rhythm.     Pulses:          Carotid pulses are 2+ on the right side with bruit and 2+ on the left side with bruit.      Dorsalis pedis pulses are 0 on the right side and 0 on the left side.       Posterior tibial pulses are 1+ on the right side and 1+ on the left side.     Heart sounds: Normal heart sounds. No murmur heard.  No gallop.      Comments: Trace edema Pulmonary:     Effort: Pulmonary effort is normal.     Breath sounds: Normal breath sounds. No wheezing, rhonchi or rales.  Abdominal:     Comments: Obese. Pannus present  Musculoskeletal:        General: Normal range of motion.     Cervical back: Neck supple.  Skin:    General: Skin is warm and dry.  Neurological:     Mental Status: She is alert.    Radiology: No results found.  Laboratory examination:   05/27/2018: Glucose 109, creatinine 0.95, EGFR 62/72, sodium 133, potassium 4.2, CMP otherwise normal.  Cholesterol 266, triglycerides 146, HDL 58, LDL 179.  CMP Latest Ref Rng & Units 02/14/2020 01/06/2020 11/06/2019  Glucose 65  - 99 mg/dL 96 107(H) 106(H)  BUN 8 - 27 mg/dL _1 Creatinine 0.57 - 1.00 mg/dL 1.45(H) 1.54(H) 1.34(H)  Sodium 134 - 144 mmol/L 143 141 143  Potassium 3.5 - 5.2 mmol/L 4.8 4.5 3.4(L)  Chloride 96 - 106 mmol/L 102 102 96(L)  CO2 20 - 29 mmol/L 25 30 35(H)  Calcium 8.7 - 10.3 mg/dL 9.6 9.8 9.6  Total Protein 6.0 - 8.3 g/dL - 6.9 -  Total Bilirubin 0.2 - 1.2 mg/dL - 0.6 -  Alkaline Phos 39 - 117 U/L - 96 -  AST 0 - 37 U/L - 12 -  ALT 0 - 35 U/L - 11 -   CBC Latest Ref Rng & Units 01/06/2020 11/03/2019 11/02/2019  WBC 4.0 - 10.5 K/uL 7.0 5.5 6.5  Hemoglobin 12.0 - 15.0 g/dL  13.0 12.8 13.4  Hematocrit 36 - 46 % 39.3 42.8 44.3  Platelets 150 - 400 K/uL 246.0 213 222   Lipid Panel     Component Value Date/Time   CHOL 265 (H) 01/06/2020 0839   TRIG 133.0 01/06/2020 0839   HDL 56.30 01/06/2020 0839   CHOLHDL 5 01/06/2020 0839   VLDL 26.6 01/06/2020 0839   LDLCALC 182 (H) 01/06/2020 0839   Medications   Medications Discontinued During This Encounter  Medication Reason  . cyanocobalamin (,VITAMIN B-12,) 1000 MCG/ML injection Change in therapy   Current Meds  Medication Sig  . acetaminophen (TYLENOL) 325 MG tablet Take 2 tablets (650 mg total) by mouth every 4 (four) hours as needed for headache or mild pain.  Marland Kitchen albuterol (PROAIR HFA) 108 (90 Base) MCG/ACT inhaler Inhale 2 puffs into the lungs every 6 (six) hours as needed for wheezing or shortness of breath.  . anastrozole (ARIMIDEX) 1 MG tablet TAKE 1 TABLET BY MOUTH EVERY DAY  . aspirin 81 MG chewable tablet Chew 81 mg by mouth daily.  . Blood Glucose Monitoring Suppl (ACCU-CHEK GUIDE) w/Device KIT   . cilostazol (PLETAL) 100 MG tablet Take 1 tablet (100 mg total) by mouth 2 (two) times daily.  . diclofenac sodium (VOLTAREN) 1 % GEL Apply 4 g topically 4 (four) times daily as needed (knee and back pain). Knees and back for pain  . Fluticasone-Umeclidin-Vilant (TRELEGY ELLIPTA) 100-62.5-25 MCG/INH AEPB Inhale 1 puff into the  lungs daily.  . metoprolol succinate (TOPROL-XL) 25 MG 24 hr tablet Take 0.5 tablets (12.5 mg total) by mouth daily.  . Multiple Vitamins-Minerals (PRESERVISION AREDS 2 PO) Take 1 tablet by mouth daily.   . pantoprazole (PROTONIX) 20 MG tablet Take 1 tablet (20 mg total) by mouth daily.  . potassium chloride SA (KLOR-CON M20) 20 MEQ tablet Take 1 tablet (20 mEq total) by mouth daily.  Marland Kitchen torsemide (DEMADEX) 10 MG tablet TAKE 1 TABLET BY MOUTH TWICE A DAY    Cardiac Studies:  Echo 10/18/2019:  1. Left ventricular ejection fraction, by estimation, is 60 to 65%. The left ventricle has normal function. The left ventricle has no regional wall motion abnormalities. Left ventricular diastolic parameters are indeterminate.  2. Right ventricular systolic function is normal. The right ventricular size is not well visualized.  3. The mitral valve is grossly normal. Trivial mitral valve regurgitation. No evidence of mitral stenosis.  4. The aortic valve was not well visualized. Aortic valve regurgitation is not visualized. No aortic stenosis is present.  5. The inferior vena cava is dilated in size with <50% respiratory variability, suggesting right atrial pressure of 15 mmHg.  Comparison(s): No prior Echocardiogram. Conclusion(s)/Recommendation(s): Technically difficult study, even with use of echo contrast. LV function normal. Difficult to see RV, but in some views appears it may be mildly-moderately enlarged. IVC dilated and does not collapse, consistent with elevated right atrial pressure.   Echocardiogram 10/14/2016: Poor echo window. Wall motion abnormality has reduced sensitivity. Left ventricle cavity is normal in size. Moderate concentric hypertrophy of the left ventricle. Normal global wall motion. Visual EF is 50-55%. Doppler evidence of grade II (pseudonormal) diastolic dysfunction, elevated LAP. Left atrial cavity is mildly dilated at 4.1 cm. Trace tricuspid regurgitation. Unable to estimate  PA pressure due to absence/minimal TR signal.  Peripheral arteriogram 04/04/2014: Stenting left SFA 6.0 x 150 mm Smartflex self-expanding stent, 100% to 0%. Three-vessel runoff below left knee. Right leg not studied.  Lower extremity arterial duplex 07/17/2015: No hemodynamically  significant stenoses are identified in both the lower extremity arterial system. Severe dampened biphasic waveforms suggest diffuse disease. This exam reveals moderately decreased perfusion of the lower extremity bilaterally. LABI 0.68 and RABI 0.61. Consider further work-up if clinically indicated.  Assessment   1. Claudication in peripheral vascular disease ([place])   2. Chronic diastolic (congestive) heart failure ([place])   3. Bilateral carotid bruits   4. Hypercholesterolemia    EKG 09/14/2017: Sinus tachycardia at the rate of 103 bpm, borderline right ear for left atrial enlargement, normal axis. However progression, cannot exclude anteroseptal infarct old. Low-voltage complexes. No evidence of ischemia. No significant change from EKG 04/23/2016.  Recommendations:   Patient presents today for follow-up on chronic diastolic heart failure, peripheral artery disease, hyperlipidemia.  Reviewed external labs, LDL elevated at 182 will start her on next NextLizet as patient has been unable to tolerate statins in the past.  Will recheck lipid panel in about 2 months.  On physical exam patient also noted to have new bilateral carotid bruits.  Will evaluate with carotid artery duplex. Blood pressure well controlled presently.   In regard to her peripheral artery disease and diastolic heart failure patient is stable.  Will continue current medications.  Encourage patient to continue to focus on losing weight and increasing her daily exercise.  Discussed the importance of heart healthy diet, patient expressed understanding. Encouraged patient to focus on tobacco cessation as well.   Encouraged patient to continue to follow with  pulmonology, nephrology, as well as her primary care provider.  Follow-up in 6 months for chronic diastolic heart failure, hyperlipidemia, and peripheral artery disease.   Patient was seen in collaboration with Dr. Einar Gip. He also reviewed patient's chart and examined the patient. Dr. Einar Gip is in agreement of the plan.    Alethia Berthold, PA-C 02/23/2020, 4:57 PM Office: [phone]

## 2020-02-23 NOTE — Progress Notes (Signed)
EKG 02/23/2020: Normal sinus rhythm at rate of 93 bpm, left atrial enlargement, normal axis.  Poor R wave progression, cannot exclude anteroseptal infarct old.  Low-voltage complexes.  Consider pulmonary disease pattern.

## 2020-02-23 NOTE — Telephone Encounter (Addendum)
Fax has been received from [employer] on pt. Dr. [name] has been out of the office but is scheduled to return back to office 02/29/20. Fax is in Dr. Bari Mantis mail slot in [place].

## 2020-02-23 NOTE — Telephone Encounter (Signed)
Triage, please advise if this has been received. Thanks.

## 2020-02-28 ENCOUNTER — Encounter: Payer: Self-pay | Admitting: Student

## 2020-03-01 NOTE — Telephone Encounter (Signed)
Triage, can you help to see if this has been faxed? Thanks.

## 2020-03-01 NOTE — Telephone Encounter (Signed)
Fax has been obtained, signed, and faxed to provided fax number on paperwork. Nothing further needed.

## 2020-03-15 ENCOUNTER — Ambulatory Visit (INDEPENDENT_AMBULATORY_CARE_PROVIDER_SITE_OTHER): Payer: Medicare PPO

## 2020-03-15 VITALS — Ht 66.0 in | Wt 329.0 lb

## 2020-03-15 DIAGNOSIS — Z780 Asymptomatic menopausal state: Secondary | ICD-10-CM | POA: Diagnosis not present

## 2020-03-15 DIAGNOSIS — Z0000 Encounter for general adult medical examination without abnormal findings: Secondary | ICD-10-CM

## 2020-03-15 NOTE — Progress Notes (Signed)
Subjective:   Daisy [name] is a 69 y.o. female who presents for an Initial Medicare Annual Wellness Visit.  Review of Systems    No ROS.  Medicare Wellness Virtual Visit.   Cardiac Risk Factors include: advanced age (>74mn, >>13women);hypertension     Objective:    Today's Vitals   03/15/20 1134  Weight: (!) 329 lb (149.2 kg)  Height: '5\' 6"'  (1.676 m)   Body mass index is 53.1 kg/m.  Advanced Directives 03/15/2020 12/31/2019 11/03/2019 10/19/2019 10/17/2019 10/17/2019 09/21/2017  Does Patient Have a Medical Advance Directive? No No No No No No No  Would patient like information on creating a medical advance directive? No - Patient declined No - Patient declined No - Patient declined No - Patient declined Yes (Inpatient - patient requests chaplain consult to create a medical advance directive) No - Patient declined -    Current Medications (verified) Outpatient Encounter Medications as of 03/15/2020  Medication Sig  . acetaminophen (TYLENOL) 325 MG tablet Take 2 tablets (650 mg total) by mouth every 4 (four) hours as needed for headache or mild pain.  .Marland Kitchenalbuterol (PROAIR HFA) 108 (90 Base) MCG/ACT inhaler Inhale 2 puffs into the lungs every 6 (six) hours as needed for wheezing or shortness of breath.  . anastrozole (ARIMIDEX) 1 MG tablet TAKE 1 TABLET BY MOUTH EVERY DAY  . aspirin 81 MG chewable tablet Chew 81 mg by mouth daily.  . Bempedoic Acid-Ezetimibe (NEXLIZET) 180-10 MG TABS Take 1 tablet by mouth daily.  . Blood Glucose Monitoring Suppl (ACCU-CHEK GUIDE) w/Device KIT   . cilostazol (PLETAL) 100 MG tablet Take 1 tablet (100 mg total) by mouth 2 (two) times daily.  . diclofenac sodium (VOLTAREN) 1 % GEL Apply 4 g topically 4 (four) times daily as needed (knee and back pain). Knees and back for pain  . Fluticasone-Umeclidin-Vilant (TRELEGY ELLIPTA) 100-62.5-25 MCG/INH AEPB Inhale 1 puff into the lungs daily.  . metoprolol succinate (TOPROL-XL) 25 MG 24 hr tablet Take 0.5  tablets (12.5 mg total) by mouth daily.  . Multiple Vitamins-Minerals (PRESERVISION AREDS 2 PO) Take 1 tablet by mouth daily.   . pantoprazole (PROTONIX) 20 MG tablet Take 1 tablet (20 mg total) by mouth daily.  . potassium chloride SA (KLOR-CON M20) 20 MEQ tablet Take 1 tablet (20 mEq total) by mouth daily.  .Marland Kitchentorsemide (DEMADEX) 10 MG tablet TAKE 1 TABLET BY MOUTH TWICE A DAY   No facility-administered encounter medications on file as of 03/15/2020.    Allergies (verified) Levaquin [levofloxacin], Sulfur, and Statins   History: Past Medical History:  Diagnosis Date  . Acid reflux   . Acute heart [place] ([place] 10/17[place]  . AKI (acute kidney injury) ([facility] 10/17/2019  . Arthritis   . Breast cancer of upper-outer quadrant of right female breast ([facility] 11/19/2016  . Chicken pox   . Cholecystitis   . Chronic diastolic heart failure ([place] 01/04/2020  . CKD (chronic kidney disease), stage III 01/04/2020  . Depression   . Dyspnea   . History of radiation therapy 01/20/17-03/02/17   left breast was treated to 50.4 Gy in 18 fractions  . HLD (hyperlipidemia) 01/04/2020  . Hyperlipemia   . Irregular heart rate   . Malignant neoplasm of upper-outer quadrant of left breast in female, estrogen receptor positive ([facility] 09/30/2016  . Morbid obesity with BMI of 50.0-59.9, adult ([facility] 01/04/2020  . Obesity   . Peripheral vascular disease ([facility]   . Pneumonia   . Prediabetes 01/04/2020  .  Urinary tract infection   . Urinary tract infection with hematuria    Past Surgical History:  Procedure Laterality Date  . ABDOMINAL HYSTERECTOMY    . BREAST LUMPECTOMY Left 11/19/2016   BREAST LUMPECTOMY WITH RADIOACTIVE SEED AND SENTINEL LYMPH NODE BIOPSY (Left)  . BREAST LUMPECTOMY WITH RADIOACTIVE SEED AND SENTINEL LYMPH NODE BIOPSY Left 11/19/2016   Procedure: BREAST LUMPECTOMY WITH RADIOACTIVE SEED AND SENTINEL LYMPH NODE BIOPSY;  Surgeon: Rolm Bookbinder, MD;  Location: [facility];  Service: General;  Laterality: Left;    . CESAREAN SECTION    . CESAREAN SECTION     x 2  . CHOLECYSTECTOMY  2007  . KNEE ARTHROSCOPY     bilateral  . KNEE SURGERY Bilateral   . LOWER EXTREMITY ANGIOGRAM N/A 04/04/2014   Procedure: LOWER EXTREMITY ANGIOGRAM;  Surgeon: Laverda Page, MD;  Location: [facility] C[facility];  Service: Cardiovascular;  Laterality: N/A;  . OVARIAN CYST REMOVAL    . RE-EXCISION OF BREAST LUMPECTOMY Left 12/12/2016   Procedure: RE-EXCISION OF LEFT BREAST LUMPECTOMY;  Surgeon: Rolm Bookbinder, MD;  Location: [facility];  Service: General;  Laterality: Left;  . ROTATOR CUFF REPAIR Right   . TONSILLECTOMY    . WRIST SURGERY Bilateral    Family History  Problem Relation Age of Onset  . Heart disease Mother   . Lung cancer Father   . Heart disease Father   . Heart disease Brother    Social History   Socioeconomic History  . Marital status: Single    Spouse name: Not on file  . Number of children: 2  . Years of education: Not on file  . Highest education level: Not on file  Occupational History  . Not on file  Tobacco Use  . Smoking status: Former Smoker    Packs/day: 0.50    Years: 30.00    Pack years: 15.00    Types: E-cigarettes    Quit date: 11/02/2019    Years since quitting: 0.3  . Smokeless tobacco: Never Used  Vaping Use  . Vaping Use: Never used  Substance and Sexual Activity  . Alcohol use: No  . Drug use: No  . Sexual activity: Not on file  Other Topics Concern  . Not on file  Social History Narrative   Retired Recruitment consultant. Lives in her own home. Her daughter Daisy [name] who retired from paramedic job and her partner moved in. There is another daughter Daisy [name] who lives local. No grandchildren.   Social Determinants of Health   Financial Resource Strain: Low Risk   . Difficulty of Paying Living Expenses: Not hard at all  Food Insecurity: No Food Insecurity  . Worried About Charity fundraiser in the Last Year: Never true  . Ran Out of Food in the Last Year: Never true   Transportation Needs: No Transportation Needs  . Lack of Transportation (Medical): No  . Lack of Transportation (Non-Medical): No  Physical Activity: Insufficiently Active  . Days of Exercise per Week: 2 days  . Minutes of Exercise per Session: 30 min  Stress: No Stress Concern Present  . Feeling of Stress : Not at all  Social Connections: Unknown  . Frequency of Communication with Friends and Family: More than three times a week  . Frequency of Social Gatherings with Friends and Family: More than three times a week  . Attends Religious Services: Not on file  . Active Member of Clubs or Organizations: Not on file  . Attends Archivist  Meetings: Not on file  . Marital Status: Not on file    Tobacco Counseling Counseling given: Not Answered   Clinical Intake:  Pre-visit preparation completed: Yes        Diabetes: Yes  How often do you need to have someone help you when you read instructions, pamphlets, or other written materials from your doctor or pharmacy?: 1 - Never   Interpreter Needed?: No      Activities of Daily Living In your present state of health, do you have any difficulty performing the following activities: 03/15/2020 11/03/2019  Hearing? N N  Vision? N N  Difficulty concentrating or making decisions? N N  Walking or climbing stairs? Y Y  Dressing or bathing? N Y  Doing errands, shopping? N Y  Conservation officer, nature and eating ? Y -  Comment Daughter assist -  Using the Toilet? N -  In the past six months, have you accidently leaked urine? N -  Do you have problems with loss of bowel control? N -  Managing your Medications? N -  Managing your Finances? N -  Housekeeping or managing your Housekeeping? Y -  Comment Daughter assist -  Some recent data might be hidden    Patient Care Team: Marval Regal, NP as PCP - General (Nurse Practitioner) Rolm Bookbinder, MD as Consulting Physician (General Surgery) Magrinat, Virgie Dad, MD as  Consulting Physician (Oncology) Gery Pray, MD as Consulting Physician (Radiation Oncology) Lorelle Gibbs, MD (Radiology) Armbruster, Carlota Raspberry, MD as Consulting Physician (Gastroenterology) Adrian Prows, MD as Consulting Physician (Cardiology) Earlie Server, MD as Consulting Physician (Orthopedic Surgery) Rigoberto Noel, MD as Consulting Physician (Pulmonary Disease)  Indicate any recent Medical Services you may have received from other than [facility] providers in the past year (date may be approximate).     Assessment:   This is a routine wellness examination for [place].  I connected with Kemia today by telephone and verified that I am speaking with the correct person using two identifiers. Location patient: home Location provider: work Persons participating in the virtual visit: patient, Marine scientist.    I discussed the limitations, risks, security and privacy concerns of performing an evaluation and management service by telephone and the availability of in person appointments. The patient expressed understanding and verbally consented to this telephonic visit.    Interactive audio and video telecommunications were attempted between this provider and patient, however failed, due to patient having technical difficulties OR patient did not have access to video capability.  We continued and completed visit with audio only.  Some vital signs may be absent or patient reported.   Hearing/Vision screen  Hearing Screening   '125Hz'  '250Hz'  '500Hz'  '1000Hz'  '2000Hz'  '3000Hz'  '4000Hz'  '6000Hz'  '8000Hz'   Right ear:           Left ear:           Comments: Patient is able to hear conversational tones without difficulty.  No issues reported.  Vision Screening Comments: Visual acuity not assessed, virtual visit.     Dietary issues and exercise activities discussed: Current Exercise Habits: Home exercise routine, Type of exercise: stretching, Time (Minutes): 30, Frequency (Times/Week): 2, Weekly Exercise  (Minutes/Week): 60, Intensity: Mild  Goals      Patient Stated   .  Maintain Healthy Lifestyle (pt-stated)      Stay active  Healthy diet Stay hydrated      Depression Screen PHQ 2/9 Scores 03/15/2020 01/04/2020 03/26/2017 12/25/2016  PHQ - 2 Score 0 0 0  0  PHQ- 9 Score - 0 - -    Fall Risk Fall Risk  03/15/2020 01/04/2020 09/21/2017 03/26/2017 12/25/2016  Falls in the past year? 0 0 No No No  Number falls in past yr: 0 - - - -  Risk for fall due to : - History of fall(s);Impaired balance/gait - - -  Follow up Falls evaluation completed Falls evaluation completed - - -   Handrails in use when climbing stairs? Yes Home [name] of loose throw rugs in walkways, pet beds, electrical cords, etc? Yes  Adequate lighting in your home to reduce risk of falls? Yes   ASSISTIVE DEVICES UTILIZED TO PREVENT FALLS: Life alert? No  Use of a cane, walker or w/c? Yes  Grab bars in the bathroom? Yes  Shower chair or bench in shower? Yes  Elevated toilet seat or a handicapped toilet? Yes   TIMED UP AND GO: Was the test performed? No . Virtual visit.   Cognitive Function: Patient is alert and oriented x3.  Denies difficulty focusing, making decisions, memory loss.       Immunizations Immunization History  Administered Date(s) Administered  . PFIZER SARS-COV-2 Vaccination 08/12/2019, 09/07/2019  . Tdap 05/25/2011    Health Maintenance Health Maintenance  Topic Date Due  . DEXA SCAN  Never done  . MAMMOGRAM  09/24/2018  . INFLUENZA VACCINE  03/26/2020 (Originally 01/15/2020)  . COLONOSCOPY  03/26/2020 (Originally 09/12/2000)  . PNA vac Low Risk Adult (1 of 2 - PCV13) 03/15/2021 (Originally 09/13/2015)  . URINE MICROALBUMIN  01/05/2021  . TETANUS/TDAP  05/24/2021  . COVID-19 Vaccine  Completed  . Hepatitis C Screening  Completed   Dental Screening: Recommended annual dental exams for proper oral hygiene.  Dexa Scan- ordered per consent.   Community Resource Referral / Chronic Care  Management: CRR required this visit?  No   CCM required this visit?  No      Plan:   Keep all routine maintenance appointments.   Follow up 03/26/20 @ 11:00.  I have personally reviewed and noted the following in the patient's chart:   . Medical and social history . Use of alcohol, tobacco or illicit drugs  . Current medications and supplements . Functional ability and status . Nutritional status . Physical activity . Advanced directives . List of other physicians . Hospitalizations, surgeries, and ER visits in previous 12 months . Vitals . Screenings to include cognitive, depression, and falls . Referrals and appointments  In addition, I have reviewed and discussed with patient certain preventive protocols, quality metrics, and best practice recommendations. A written personalized care plan for preventive services as well as general preventive health recommendations were provided to patient via mychart.     Varney Biles, LPN   [phone]

## 2020-03-15 NOTE — Patient Instructions (Addendum)
Ms. Brenton , Thank you for taking time to come for your Medicare Wellness Visit. I appreciate your ongoing commitment to your health goals. Please review the following plan we discussed and let me know if I can assist you in the future.   These are the goals we discussed: Goals      Patient Stated   .  Maintain Healthy Lifestyle (pt-stated)      Stay active  Healthy diet Stay hydrated       This is a list of the screening recommended for you and due dates:  Health Maintenance  Topic Date Due  . DEXA scan (bone density measurement)  Never done  . Mammogram  09/24/2018  . Flu Shot  03/26/2020*  . Colon Cancer Screening  03/26/2020*  . Pneumonia vaccines (1 of 2 - PCV13) 03/15/2021*  . Urine Protein Check  01/05/2021  . Tetanus Vaccine  05/24/2021  . COVID-19 Vaccine  Completed  .  Hepatitis C: One time screening is recommended by Center for Disease Control  (CDC) for  adults born from [birth year] through 1965.   Completed  *Topic was postponed. The date shown is not the original due date.    Immunizations Immunization History  Administered Date(s) Administered  . PFIZER SARS-COV-2 Vaccination 08/12/2019, 09/07/2019  . Tdap 05/25/2011   Bone Density Test The bone density test uses a special type of X-ray to measure the amount of calcium and other minerals in your bones. It can measure bone density in the hip and the spine. The test procedure is similar to having a regular X-ray. This test may also be called:  Bone densitometry.  Bone mineral density test.  Dual-energy X-ray absorptiometry (DEXA). You may have this test to:  Diagnose a condition that causes weak or thin bones (osteoporosis).  Screen you for osteoporosis.  Predict your risk for a broken bone (fracture).  Determine how well your osteoporosis treatment is working. Tell a health care provider about:  Any allergies you have.  All medicines you are taking, including vitamins, herbs, eye drops, creams, and  over-the-counter medicines.  Any problems you or family members have had with anesthetic medicines.  Any blood disorders you have.  Any surgeries you have had.  Any medical conditions you have.  Whether you are pregnant or may be pregnant.  Any medical tests you have had within the past 14 days that used contrast material. What are the risks? Generally, this is a safe procedure. However, it does expose you to a small amount of radiation, which can slightly increase your cancer risk. What happens before the procedure?  Do not take any calcium supplements starting 24 hours before your test.  Remove all metal jewelry, eyeglasses, dental appliances, and any other metal objects. What happens during the procedure?   You will lie down on an exam table. There will be an X-ray generator below you and an imaging device above you.  Other devices, such as boxes or braces, may be used to position your body properly for the scan.  The machine will slowly scan your body. You will need to keep still.  The images will show up on a screen in the room. Images will be examined by a specialist after your test is done. The procedure may vary among health care providers and hospitals. What happens after the procedure?  It is up to you to get your test results. Ask your health care provider, or the department that is doing the test, when your results  will be ready. Summary  A bone density test is an imaging test that uses a type of X-ray to measure the amount of calcium and other minerals in your bones.  The test may be used to diagnose or screen you for a condition that causes weak or thin bones (osteoporosis), predict your risk for a broken bone (fracture), or determine how well your osteoporosis treatment is working.  Do not take any calcium supplements starting 24 hours before your test.  Ask your health care provider, or the department that is doing the test, when your results will be  ready. This information is not intended to replace advice given to you by your health care provider. Make sure you discuss any questions you have with your health care provider. Document Revised: 06/18/2017 Document Reviewed: 04/06/2017 Elsevier Patient Education  [place].

## 2020-03-20 ENCOUNTER — Other Ambulatory Visit: Payer: Self-pay | Admitting: Nephrology

## 2020-03-20 DIAGNOSIS — R8271 Bacteriuria: Secondary | ICD-10-CM

## 2020-03-20 DIAGNOSIS — N1832 Chronic kidney disease, stage 3b: Secondary | ICD-10-CM

## 2020-03-20 DIAGNOSIS — R809 Proteinuria, unspecified: Secondary | ICD-10-CM

## 2020-03-23 ENCOUNTER — Other Ambulatory Visit: Payer: Self-pay | Admitting: Nurse Practitioner

## 2020-03-23 DIAGNOSIS — J9622 Acute and chronic respiratory failure with hypercapnia: Secondary | ICD-10-CM | POA: Diagnosis not present

## 2020-03-23 DIAGNOSIS — J9621 Acute and chronic respiratory failure with hypoxia: Secondary | ICD-10-CM | POA: Diagnosis not present

## 2020-03-23 DIAGNOSIS — E662 Morbid (severe) obesity with alveolar hypoventilation: Secondary | ICD-10-CM | POA: Diagnosis not present

## 2020-03-26 ENCOUNTER — Ambulatory Visit: Payer: Medicare PPO | Admitting: Nurse Practitioner

## 2020-03-26 ENCOUNTER — Encounter: Payer: Self-pay | Admitting: Nurse Practitioner

## 2020-03-26 ENCOUNTER — Other Ambulatory Visit: Payer: Self-pay

## 2020-03-26 ENCOUNTER — Ambulatory Visit
Admission: RE | Admit: 2020-03-26 | Discharge: 2020-03-26 | Disposition: A | Discharge status: Home / Self Care | Payer: Medicare PPO | Source: Ambulatory Visit | Attending: Nephrology | Admitting: Nephrology

## 2020-03-26 VITALS — BP 144/70 | HR 94 | Temp 98.2°F | Ht 66.0 in | Wt 316.0 lb

## 2020-03-26 DIAGNOSIS — Z23 Encounter for immunization: Secondary | ICD-10-CM | POA: Diagnosis not present

## 2020-03-26 DIAGNOSIS — R809 Proteinuria, unspecified: Secondary | ICD-10-CM | POA: Diagnosis not present

## 2020-03-26 DIAGNOSIS — E782 Mixed hyperlipidemia: Secondary | ICD-10-CM | POA: Diagnosis not present

## 2020-03-26 DIAGNOSIS — E1169 Type 2 diabetes mellitus with other specified complication: Secondary | ICD-10-CM | POA: Diagnosis not present

## 2020-03-26 DIAGNOSIS — R300 Dysuria: Secondary | ICD-10-CM | POA: Diagnosis not present

## 2020-03-26 DIAGNOSIS — Z6841 Body Mass Index (BMI) 40.0 and over, adult: Secondary | ICD-10-CM

## 2020-03-26 DIAGNOSIS — M25561 Pain in right knee: Secondary | ICD-10-CM

## 2020-03-26 DIAGNOSIS — R8271 Bacteriuria: Secondary | ICD-10-CM | POA: Diagnosis not present

## 2020-03-26 DIAGNOSIS — R7303 Prediabetes: Secondary | ICD-10-CM

## 2020-03-26 DIAGNOSIS — Z0000 Encounter for general adult medical examination without abnormal findings: Secondary | ICD-10-CM | POA: Insufficient documentation

## 2020-03-26 DIAGNOSIS — I5032 Chronic diastolic (congestive) heart failure: Secondary | ICD-10-CM

## 2020-03-26 DIAGNOSIS — Z1211 Encounter for screening for malignant neoplasm of colon: Secondary | ICD-10-CM

## 2020-03-26 DIAGNOSIS — N3289 Other specified disorders of bladder: Secondary | ICD-10-CM | POA: Diagnosis not present

## 2020-03-26 DIAGNOSIS — N1832 Chronic kidney disease, stage 3b: Secondary | ICD-10-CM

## 2020-03-26 DIAGNOSIS — M25562 Pain in left knee: Secondary | ICD-10-CM

## 2020-03-26 DIAGNOSIS — N183 Chronic kidney disease, stage 3 unspecified: Secondary | ICD-10-CM | POA: Diagnosis not present

## 2020-03-26 DIAGNOSIS — M545 Low back pain, unspecified: Secondary | ICD-10-CM

## 2020-03-26 DIAGNOSIS — G8929 Other chronic pain: Secondary | ICD-10-CM

## 2020-03-26 LAB — POCT URINALYSIS DIPSTICK
Bilirubin, UA: NEGATIVE
Blood, UA: NEGATIVE
Glucose, UA: NEGATIVE
Nitrite, UA: POSITIVE
Protein, UA: NEGATIVE
Spec Grav, UA: 1.01 (ref 1.010–1.025)
Urobilinogen, UA: 2 E.U./dL — AB
pH, UA: 6 (ref 5.0–8.0)

## 2020-03-26 LAB — URINALYSIS, MICROSCOPIC ONLY: RBC / HPF: NONE SEEN (ref 0–?)

## 2020-03-26 MED ORDER — CEPHALEXIN 500 MG PO CAPS: 500.0000 mg | ORAL_CAPSULE | Freq: Two times a day (BID) | ORAL | 0 refills | Status: AC

## 2020-03-26 NOTE — Patient Instructions (Addendum)
I have ordered lab work for next month.  Please make lab appointment.  KEFLEX for dysuria- urine culture is pending.   I have ordered referral to orthopedics for you or your low back pain and knee pain.  I have ordered Cologuard stool studies for colon cancer screening that will be delivered to you.  I will place an order for bone density study for you.  Please call and schedule your bone density as discussed  [facility]  [address] [place], [place] [phone]   Continue plans for mammogram as you already have arranged.  We discussed healthy diet and exercise.  If you would like referral to nutritionist let me know.  Flu vaccine today.  You may get your pneumonia vaccine next week either through our office or your pharmacy.  Dysuria Dysuria is pain or discomfort while urinating. The pain or discomfort may be felt in the part of your body that drains urine from the bladder (urethra) or in the surrounding tissue of the genitals. The pain may also be felt in the groin area, lower abdomen, or lower back. You may have to urinate frequently or have the sudden feeling that you have to urinate (urgency). Dysuria can affect both men and women, but it is more common in women. Dysuria can be caused by many different things, including:  Urinary tract infection.  Kidney stones or bladder stones.  Certain sexually transmitted infections (STIs), such as chlamydia.  Dehydration.  Inflammation of the tissues of the vagina.  Use of certain medicines.  Use of certain soaps or scented products that cause irritation. Follow these instructions at home: General instructions  Watch your condition for any changes.  Urinate often. Avoid holding urine for long periods of time.  After a bowel movement or urination, women should cleanse from front to back, using each tissue only once.  Urinate after sexual intercourse.  Keep all follow-up visits as told by  your health care provider. This is important.  If you had any tests done to find the cause of dysuria, it is up to you to get your test results. Ask your health care provider, or the department that is doing the test, when your results will be ready. Eating and drinking   Drink enough fluid to keep your urine pale yellow.  Avoid caffeine, tea, and alcohol. They can irritate the bladder and make dysuria worse. In men, alcohol may irritate the prostate. Medicines  Take over-the-counter and prescription medicines only as told by your health care provider.  If you were prescribed an antibiotic medicine, take it as told by your health care provider. Do not stop taking the antibiotic even if you start to feel better. Contact a health care provider if:  You have a fever.  You develop pain in your back or sides.  You have nausea or vomiting.  You have blood in your urine.  You are not urinating as often as you usually do. Get help right away if:  Your pain is severe and not relieved with medicines.  You cannot eat or drink without vomiting.  You are confused.  You have a rapid heartbeat while at rest.  You have shaking or chills.  You feel extremely weak. Summary  Dysuria is pain or discomfort while urinating. Many different conditions can lead to dysuria.  If you have dysuria, you may have to urinate frequently or have the sudden feeling that you have to urinate (urgency).  Watch your condition for any changes.  Keep all follow-up visits as told by your health care provider.  Make sure that you urinate often and drink enough fluid to keep your urine pale yellow. This information is not intended to replace advice given to you by your health care provider. Make sure you discuss any questions you have with your health care provider. Document Revised: 05/15/2017 Document Reviewed: 03/19/2017 Elsevier Patient Education  [place].

## 2020-03-26 NOTE — Progress Notes (Signed)
Established Patient Office Visit  Subjective:  Patient ID: Daisy [name], female    DOB: Apr 14, 1951  Age: 69 y.o. MRN: [id number]  CC:  Chief Complaint  Patient presents with  . Follow-up    HPI Daisy [name] presents for follow-up of chronic problems including pitting edema, and new problem with dysuria.  She has chronic back and knee pain and stable claudication symptoms.  She was found to have new bilateral carotid bruits by cardiology and carotid artery duplex ordered. No CHF or CP.  She cannot afford lipid lowering medication.  Blood pressures well controlled.  Chronic kidney disease followed by nephrology and upcoming renal ultrasound.  Chronic pulmonary disease followed by pulmonology- stable  Edema:  Resolved: She had an increase in her torsemide from 10 mg to 20 mg daily for 1 week in Aug. She is back to 10 mg daily.  She was advised compression knee highs which she was not able to wear.  Her edema has totally resolved in both legs.  Marked improvement.  She is tolerating torsemide 10 mg daily with KCL 20 mEq daily.    Morbid Obesity: BMI 51: Improving Weight is down.  Not sure how much of that is fluid versus she has been trying to eat healthier diet.  Patient is a light eater, will skip meals.  Her daughter has trouble finding food that she will eat.  She is trying to give her a low-salt, diabetic, low-fat diet and the patient does not eat those foods.  Wt Readings from Last 3 Encounters:  03/26/20 (!) 316 lb (143.3 kg)  03/15/20 (!) 329 lb (149.2 kg)  02/23/20 (!) 329 lb 6.4 oz (149.4 kg)    She reports urinary burning for 1 week or so.  No flank pain.  No fevers chills, nausea,  vomiting.  She has noted no blood in the urine.  She is followed by nephrology and has a kidney ultrasound arranged.  She does not typically get urinary tract infections.    Chronic kidney disease stage IIIb with proteinuria: History of NSAIDs, hypertension, diabetes.  Currently not on  ACE-1/ARB.  Advised to avoid NSAIDs.  Chronic diastolic heart failure/ PAD/ HLD: Followed by Cardiology : LDL 182.  Does not tolerate Entresto. She was given RX for Nextlizet since she has been able to tolerate any statins.  This is the lipids experimental drug that she was taking that worked well for her.  Unfortunately, she cannot afford it. Not on any statin now.  Will look into options with CCM.    B12:  Low B12 175 mg on 01/06/20: She is taking B12 OTC 500 mg -x3    COPD with chronic bronchitis/ emphysema/acute on chronic hypercarbic and hypoxic respiratory failure/ former smoker- quit 10/2019/ OSA and no sleep apnea seen on sleep study.  She likely has obesity hypoventilation. He advised that she stay on Anoro for now.  PFTs showed lung function 70%.  He advised trilogy machine but was attempting to get her a BiPAP machine to lower cost.  Healthcare maintenance: Patient is overdue for colon cancer screening.  She declines colonoscopy.  Cologuard is a better option for her without the need for IV sedation given her complex history.  She is already set up for mammogram, and needs  bone density study.  We also discussed immunizations today flu vaccine. She had her [place] last Covid vaccine in March and so she is eligible for her third booster. Next pneumonia vaccine and lastly Shingrix are  all recommended. She will get at her pharmacy.    Lab Results  Component Value Date   HGBA1C 6.5 01/06/2020   Past Medical History:  Diagnosis Date  . Acid reflux   . Acute heart failure ([place]) 10/17/2019  . AKI (acute kidney injury) ([place]) 10/17/2019  . Arthritis   . Breast cancer of upper-outer quadrant of right female breast ([place]) 11/19/2016  . Chicken pox   . Cholecystitis   . Chronic diastolic heart failure ([place]) 01/04/2020  . CKD (chronic kidney disease), stage III ([place]) 01/04/2020  . Depression   . Dyspnea   . History of radiation therapy 01/20/17-03/02/17   left breast was treated to 50.4 Gy in 18  fractions  . HLD (hyperlipidemia) 01/04/2020  . Hyperlipemia   . Irregular heart rate   . Malignant neoplasm of upper-outer quadrant of left breast in female, estrogen receptor positive ([place]) 09/30/2016  . Morbid obesity with BMI of 50.0-59.9, adult ([place]) 01/04/2020  . Obesity   . Peripheral vascular disease ([place])   . Pneumonia   . Prediabetes 01/04/2020  . Urinary tract infection   . Urinary tract infection with hematuria     Past Surgical History:  Procedure Laterality Date  . ABDOMINAL HYSTERECTOMY    . BREAST LUMPECTOMY Left 11/19/2016   BREAST LUMPECTOMY WITH RADIOACTIVE SEED AND SENTINEL LYMPH NODE BIOPSY (Left)  . BREAST LUMPECTOMY WITH RADIOACTIVE SEED AND SENTINEL LYMPH NODE BIOPSY Left 11/19/2016   Procedure: BREAST LUMPECTOMY WITH RADIOACTIVE SEED AND SENTINEL LYMPH NODE BIOPSY;  Surgeon: Rolm Bookbinder, MD;  Location: [facility];  Service: General;  Laterality: Left;  . CESAREAN SECTION    . CESAREAN SECTION     x 2  . CHOLECYSTECTOMY  2007  . KNEE ARTHROSCOPY     bilateral  . KNEE SURGERY Bilateral   . LOWER EXTREMITY ANGIOGRAM N/A 04/04/2014   Procedure: LOWER EXTREMITY ANGIOGRAM;  Surgeon: Laverda Page, MD;  [facility];  Service: Cardiovascular;  Laterality: N/A;  . OVARIAN CYST REMOVAL    . RE-EXCISION OF BREAST LUMPECTOMY Left 12/12/2016   Procedure: RE-EXCISION OF LEFT BREAST LUMPECTOMY;  Surgeon: Rolm Bookbinder, MD;  Location: [facility];  Service: General;  Laterality: Left;  . ROTATOR CUFF REPAIR Right   . TONSILLECTOMY    . WRIST SURGERY Bilateral     Family History  Problem Relation Age of Onset  . Heart disease Mother   . Lung cancer Father   . Heart disease Father   . Heart disease Brother     Social History   Socioeconomic History  . Marital status: Single    Spouse name: Not on file  . Number of children: 2  . Years of education: Not on file  . Highest education level: Not on file  Occupational History  . Not on file  Tobacco  Use  . Smoking status: Former Smoker    Packs/day: 0.50    Years: 30.00    Pack years: 15.00    Types: E-cigarettes    Quit date: 11/02/2019    Years since quitting: 0.4  . Smokeless tobacco: Never Used  Vaping Use  . Vaping Use: Never used  Substance and Sexual Activity  . Alcohol use: No  . Drug use: No  . Sexual activity: Not on file  Other Topics Concern  . Not on file  Social History Narrative   Retired Recruitment consultant. Lives in her own home. Her daughter [name] who retired from paramedic job and her partner  moved in. There is another daughter [name] who lives local. No grandchildren.   Social Determinants of Health   Financial Resource Strain: Low Risk   . Difficulty of Paying Living Expenses: Not hard at all  Food Insecurity: No Food Insecurity  . Worried About Charity fundraiser in the Last Year: Never true  . Ran Out of Food in the Last Year: Never true  Transportation Needs: No Transportation Needs  . Lack of Transportation (Medical): No  . Lack of Transportation (Non-Medical): No  Physical Activity: Insufficiently Active  . Days of Exercise per Week: 2 days  . Minutes of Exercise per Session: 30 min  Stress: No Stress Concern Present  . Feeling of Stress : Not at all  Social Connections: Unknown  . Frequency of Communication with Friends and Family: More than three times a week  . Frequency of Social Gatherings with Friends and Family: More than three times a week  . Attends Religious Services: Not on file  . Active Member of Clubs or Organizations: Not on file  . Attends Archivist Meetings: Not on file  . Marital Status: Not on file  Intimate Partner Violence: Not At Risk  . Fear of Current or Ex-Partner: No  . Emotionally Abused: No  . Physically Abused: No  . Sexually Abused: No    Outpatient Medications Prior to Visit  Medication Sig Dispense Refill  . acetaminophen (TYLENOL) 325 MG tablet Take 2 tablets (650 mg total) by mouth every 4  (four) hours as needed for headache or mild pain.    Marland Kitchen albuterol (PROAIR HFA) 108 (90 Base) MCG/ACT inhaler Inhale 2 puffs into the lungs every 6 (six) hours as needed for wheezing or shortness of breath. 18 g 3  . anastrozole (ARIMIDEX) 1 MG tablet TAKE 1 TABLET BY MOUTH EVERY DAY 90 tablet 0  . aspirin 81 MG chewable tablet Chew 81 mg by mouth daily.    . Bempedoic Acid-Ezetimibe (NEXLIZET) 180-10 MG TABS Take 1 tablet by mouth daily. 30 tablet 6  . Blood Glucose Monitoring Suppl (ACCU-CHEK GUIDE) w/Device KIT     . cilostazol (PLETAL) 100 MG tablet Take 1 tablet (100 mg total) by mouth 2 (two) times daily. 180 tablet 3  . diclofenac sodium (VOLTAREN) 1 % GEL Apply 4 g topically 4 (four) times daily as needed (knee and back pain). Knees and back for pain  5  . metoprolol succinate (TOPROL-XL) 25 MG 24 hr tablet Take 0.5 tablets (12.5 mg total) by mouth daily. 90 tablet 0  . Multiple Vitamins-Minerals (PRESERVISION AREDS 2 PO) Take 1 tablet by mouth daily.     . pantoprazole (PROTONIX) 20 MG tablet Take 1 tablet (20 mg total) by mouth daily. 90 tablet 0  . potassium chloride SA (KLOR-CON M20) 20 MEQ tablet Take 1 tablet (20 mEq total) by mouth daily. 90 tablet 0  . torsemide (DEMADEX) 10 MG tablet TAKE 1 TABLET BY MOUTH TWICE A DAY 60 tablet 1  . vitamin B-12 (CYANOCOBALAMIN) 500 MCG tablet Take by mouth.    . Fluticasone-Umeclidin-Vilant (TRELEGY ELLIPTA) 100-62.5-25 MCG/INH AEPB Inhale 1 puff into the lungs daily. 60 each 5   No facility-administered medications prior to visit.    Allergies  Allergen Reactions  . Levaquin [Levofloxacin] Other (See Comments)    Body aches  . Sulfur Hives  . Statins     Review of Systems  Constitutional: Negative for chills and fever.  HENT: Negative for congestion.  She gets vertigo and is concerned about laying on the table for the night kidney ultrasound.  She has Dramamine on hand to take if needed.  He does have Dramamine on hand and will  take  Eyes: Negative.   Respiratory: Negative for cough and shortness of breath.   Cardiovascular: Negative for chest pain, palpitations and leg swelling.  Gastrointestinal: Negative for abdominal pain, blood in stool, constipation, diarrhea and rectal pain.  Endocrine: Negative.   Genitourinary: Positive for dysuria.  Musculoskeletal: Positive for back pain and gait problem.       Uses a walker to get around the house.  From the waist down her right leg is asleep.  Right thumb and right index finger are asleep.  This is all from nerve damage in her back.  She has chronic lower back pain.  She quit taking NSAIDs because of her kidney disease.  She is wondering if she can use the NSAID gel on her knees.  Knees are very painful hard for her to get around.    Allergic/Immunologic: Negative.       Objective:    Physical Exam Vitals reviewed.  Constitutional:      Appearance: She is obese.  Cardiovascular:     Rate and Rhythm: Normal rate.     Pulses: Normal pulses.     Heart sounds: Normal heart sounds.  Pulmonary:     Effort: Pulmonary effort is normal.     Breath sounds: Normal breath sounds.  Abdominal:     Palpations: Abdomen is soft.     Tenderness: There is no abdominal tenderness.  Musculoskeletal:        General: Tenderness present.     Cervical back: Normal range of motion.     Comments: Bilateral knees with pain with ROM. She is in [facility] and cannot get on stool to get on the exam table.   Skin:    General: Skin is warm and dry.  Neurological:     General: No focal deficit present.     Mental Status: She is alert and oriented to person, place, and time.  Psychiatric:        Mood and Affect: Mood normal.        Behavior: Behavior normal.     BP (!) 144/70 (BP Location: Left Arm, Patient Position: Sitting, Cuff Size: Large)   Pulse 94   Temp 98.2 F (36.8 C) (Oral)   Ht '5\' 6"'  (1.676 m)   Wt (!) 316 lb (143.3 kg)   SpO2 94%   BMI 51.00 kg/m  Wt Readings from Last 3  Encounters:  03/26/20 (!) 316 lb (143.3 kg)  03/15/20 (!) 329 lb (149.2 kg)  02/23/20 (!) 329 lb 6.4 oz (149.4 kg)   Pulse Readings from Last 3 Encounters:  03/26/20 94  02/23/20 91  02/09/20 74    BP Readings from Last 3 Encounters:  03/26/20 (!) 144/70  02/23/20 138/76  02/09/20 120/72    Lab Results  Component Value Date   CHOL 265 (H) 01/06/2020   HDL 56.30 01/06/2020   LDLCALC 182 (H) 01/06/2020   TRIG 133.0 01/06/2020   CHOLHDL 5 01/06/2020      Health Maintenance Due  Topic Date Due  . COLONOSCOPY  Never done  . DEXA SCAN  Never done  . MAMMOGRAM  09/24/2018    There are no preventive care reminders to display for this patient.  Lab Results  Component Value Date   TSH 0.267 (L) 10/19/2019  Lab Results  Component Value Date   WBC 7.0 01/06/2020   HGB 13.0 01/06/2020   HCT 39.3 01/06/2020   MCV 92.8 01/06/2020   PLT 246.0 01/06/2020   Lab Results  Component Value Date   NA 143 02/14/2020   K 4.8 02/14/2020   CHLORIDE 95 (L) 02/27/2017   CO2 25 02/14/2020   GLUCOSE 96 02/14/2020   BUN 17 02/14/2020   CREATININE 1.45 (H) 02/14/2020   BILITOT 0.6 01/06/2020   ALKPHOS 96 01/06/2020   AST 12 01/06/2020   ALT 11 01/06/2020   PROT 6.9 01/06/2020   ALBUMIN 3.8 01/06/2020   CALCIUM 9.6 02/14/2020   ANIONGAP 12 11/06/2019   EGFR 64 (L) 02/27/2017   GFR 33.37 (L) 01/06/2020   Lab Results  Component Value Date   CHOL 265 (H) 01/06/2020   Lab Results  Component Value Date   HDL 56.30 01/06/2020   Lab Results  Component Value Date   LDLCALC 182 (H) 01/06/2020   Lab Results  Component Value Date   TRIG 133.0 01/06/2020   Lab Results  Component Value Date   CHOLHDL 5 01/06/2020   Lab Results  Component Value Date   HGBA1C 6.5 01/06/2020      Assessment & Plan:   Problem List Items Addressed This Visit      Cardiovascular and Mediastinum   Chronic diastolic heart failure ([place])   Relevant Orders   Referral to Chronic Care  Management Services     Genitourinary   CKD (chronic kidney disease), stage III ([place])   Relevant Orders   Referral to Chronic Care Management Services     Other   Morbid obesity with BMI of 50.0-59.9, adult ([place])   Relevant Orders   Hemoglobin A1c   Lipid panel   TSH   Referral to Chronic Care Management Services   HLD (hyperlipidemia)   Relevant Orders   Lipid panel   Referral to Chronic Care Management Services   Dysuria - Primary   Relevant Orders   POCT Urinalysis Dipstick (Completed)   Urine Culture   Urine Microscopic Only (Completed)   Chronic bilateral low back pain without sciatica   Relevant Orders   Ambulatory referral to Orthopedic Surgery   VITAMIN D 25 Hydroxy (Vit-D Deficiency, Fractures)   Well woman exam without gynecological exam   Chronic pain of both knees   Relevant Orders   Ambulatory referral to Orthopedic Surgery   Need for immunization against influenza   Relevant Orders   Flu Vaccine QUAD High Dose(Fluad) (Completed)    Other Visit Diagnoses    Type 2 diabetes mellitus with other specified complication, without long-term current use of insulin ([place])       Relevant Orders   Referral to Chronic Care Management Services      Meds ordered this encounter  Medications  . cephALEXin (KEFLEX) 500 MG capsule    Sig: Take 1 capsule (500 mg total) by mouth 2 (two) times daily for 7 days.    Dispense:  14 capsule    Refill:  0    Order Specific Question:   Supervising Provider    Answer:   Einar Pheasant [[id number]]  Patient's urine dip was positive for nitrite and leukocytes.  We will give empiric Keflex while waiting for urine culture to return.  She is symptomatic with new onset burning x1 week that she normally does not have.  We are working on cardiovascular risk factor modification, addressed preventative health issues today, I reviewed medication  list.  I would like to get her on a lipid-lowering agent and Nextlizet is the one that works for her,  but is expensive.She is in agreement with involving chronic care management. I have ordered lab work for next month.   She is in a wheelchair, unable to get up on the stool or on the table.  She has chronic back pain is not responding to Tylenol and she cannot use NSAIDs any longer.  She has chronic knee pain is actually worse. We will see if orthopedics can assist.    We discussed how weight loss would help that the patient really says she does not eat much and she is just not losing any weight. DTR reports her food choices are not the best.  She declines weight loss management referral for nutritionist at this time.    I have ordered Cologuard stool studies for colon cancer screening that will be delivered to you.  I will place an order for bone density study for you.  Please call and schedule your bone density as discussed- provided number on AVS.  Flu vaccine today. Covid booster and then  pneumonia vaccine and Shingrix through your pharmacy.  A total of 40 minutes of face to face time was spent with patient reviewing her medications, going over her review of systems with multiple positive findings, addressing her chief complaint, preventative care, referrals, and reviewing her active consultant notes, and labs,  counselling and coordination of care.   Follow-up: Return in about 3 months (around 06/26/2020).   This visit occurred during the SARS-CoV-2 public health emergency.  Safety protocols were in place, including screening questions prior to the visit, additional usage of staff PPE, and extensive cleaning of exam room while observing appropriate contact time as indicated for disinfecting solutions.   Denice Paradise, NP

## 2020-03-27 ENCOUNTER — Encounter: Payer: Self-pay | Admitting: Nurse Practitioner

## 2020-03-28 ENCOUNTER — Telehealth: Payer: Self-pay

## 2020-03-28 LAB — URINE CULTURE
MICRO NUMBER:: 11055636
SPECIMEN QUALITY:: ADEQUATE

## 2020-03-28 NOTE — Chronic Care Management (AMB) (Signed)
  Chronic Care Management   Note  03/28/2020 Name: [name] MRN: [id number] DOB: 1951/03/29  Daisy [name] is a 69 y.o. year old female who is a primary care patient of Marval Regal, NP. I reached out to [name] by phone today in response to a referral sent by Daisy [name]'s PCP, Denice Paradise, NP      Daisy [name] was given information about Chronic Care Management services today including:  1. CCM service includes personalized support from designated clinical staff supervised by her physician, including individualized plan of care and coordination with other care providers 2. 24/7 contact phone numbers for assistance for urgent and routine care needs. 3. Service will only be billed when office clinical staff spend 20 minutes or more in a month to coordinate care. 4. Only one practitioner may furnish and bill the service in a calendar month. 5. The patient may stop CCM services at any time (effective at the end of the month) by phone call to the office staff. 6. The patient will be responsible for cost sharing (co-pay) of up to 20% of the service fee (after annual deductible is met).  Patient agreed to services and verbal consent obtained.   Follow up plan: Telephone appointment with care management team member scheduled for:03/29/2020  Noreene Larsson, [place], [place], [place] [address] Direct Dial: [phone] [email or web address]

## 2020-03-29 ENCOUNTER — Telehealth: Payer: Self-pay | Admitting: Nurse Practitioner

## 2020-03-29 ENCOUNTER — Telehealth: Payer: Medicare PPO

## 2020-03-29 NOTE — Telephone Encounter (Signed)
Rejection Reason - Patient did not respond - Patient never called back to scheduled. Left 2 messages" [facility] said on Mar 28, 2020 8:42 AM  "LEFT 2ND MESSAGE FOR PATIENT TO RETURN CALL TO SCHEDULE APPT" [facility] said on Mar 27, 2020 9:27 AM  "LEFT MESSAGE FOR PATIENT TO RETURN CALL TO SCHEDULE APPT" [facility] said on Mar 26, 2020 5:40 PM

## 2020-04-01 DIAGNOSIS — J9622 Acute and chronic respiratory failure with hypercapnia: Secondary | ICD-10-CM | POA: Diagnosis not present

## 2020-04-01 DIAGNOSIS — I509 Heart failure, unspecified: Secondary | ICD-10-CM | POA: Diagnosis not present

## 2020-04-01 DIAGNOSIS — E662 Morbid (severe) obesity with alveolar hypoventilation: Secondary | ICD-10-CM | POA: Diagnosis not present

## 2020-04-01 DIAGNOSIS — J9621 Acute and chronic respiratory failure with hypoxia: Secondary | ICD-10-CM | POA: Diagnosis not present

## 2020-04-02 ENCOUNTER — Inpatient Hospital Stay: Payer: Medicare PPO

## 2020-04-02 ENCOUNTER — Inpatient Hospital Stay: Payer: Medicare PPO | Admitting: Oncology

## 2020-04-02 ENCOUNTER — Other Ambulatory Visit: Payer: Self-pay | Admitting: Oncology

## 2020-04-03 ENCOUNTER — Ambulatory Visit (INDEPENDENT_AMBULATORY_CARE_PROVIDER_SITE_OTHER): Payer: Medicare PPO | Admitting: Pharmacist

## 2020-04-03 DIAGNOSIS — I5032 Chronic diastolic (congestive) heart failure: Secondary | ICD-10-CM

## 2020-04-03 DIAGNOSIS — E782 Mixed hyperlipidemia: Secondary | ICD-10-CM

## 2020-04-03 DIAGNOSIS — N1832 Chronic kidney disease, stage 3b: Secondary | ICD-10-CM

## 2020-04-03 DIAGNOSIS — I10 Essential (primary) hypertension: Secondary | ICD-10-CM | POA: Diagnosis not present

## 2020-04-03 NOTE — Patient Instructions (Signed)
Visit Information  Goals Addressed            This Visit's Progress    PharmD "I need help affording this medication"       CARE PLAN ENTRY (see longitudinal plan of care for additional care plan information)  Current Barriers:   Social, financial, community barriers:  o Unable to afford Nexlizet. She notes that [facility] told her the medication "was not covered" and that they would communicate this to her provider. May have been a PA requirement. Patient never discussed w/ PCP  Polypharmacy; complex patient with multiple comorbidities including HFpEF, HTN, COPD, CKD, obesity hypoventilation syndrome, hx ER postive breast cancer, PAD s/p stenting  Most recent eGFR: ~37 mL/min o PAD/HLD: follows w/ Dr. Einar Gip. Prescribed bempidoic acid/ezetimibe 180/10 mg daily. Given 3 weeks of samples, and she believes she received active drug in a previously clinical trial. Most recent LDL 182, TC 265. Hx intolerance to statins (rosuvastatin 10 mg daily, atorvastatin 10 mg,  o PAD: cilostazol 100 mg BID, ASA 81 mg daily  o HFpEF: exacerbation requiring hospitalization in 10/2019. Did not tolerate Entresto d/t hypotension. Metoprolol succinate 12.5 mg daily, torsemide 10 mg daily, potassium 20 mEq  o Hx ER positive breast cancer: anastrazole 1 mg daily  o Supplement: meclizine PRN vertigo  Pharmacist Clinical Goal(s):   Over the next 90 days, patient will work with PharmD and provider towards medication affordability  Interventions:  Comprehensive medication review performed; medication list updated in electronic medical record  Inter-disciplinary care team collaboration (see longitudinal plan of care)  Contacted Dr. Irven Shelling office to inquire if PA was completed for Nexlizet. Upon review of [facility] documents, Nexlizet appears to be Tier 3, but Repatha may be Tier 2. Will communicate that information to cardiology.   Cilostazol is contraindicated in heart failure d/t increased risk  of MI, though data more in HFrEF. Left message for Dr. Einar Gip to discuss. Concern for continuing this medication in patient with other risk factors for MI (hyperlipidemia)  Patient Self Care Activities:   Patient will take medications as prescribed  Patient will collaborate w/ interdiscplinary team   Initial goal documentation        Ms. Kubicek was given information about Chronic Care Management services today including:  1. CCM service includes personalized support from designated clinical staff supervised by her physician, including individualized plan of care and coordination with other care providers 2. 24/7 contact phone numbers for assistance for urgent and routine care needs. 3. Service will only be billed when office clinical staff spend 20 minutes or more in a month to coordinate care. 4. Only one practitioner may furnish and bill the service in a calendar month. 5. The patient may stop CCM services at any time (effective at the end of the month) by phone call to the office staff. 6. The patient will be responsible for cost sharing (co-pay) of up to 20% of the service fee (after annual deductible is met).  Patient agreed to services and verbal consent obtained.   The patient verbalized understanding of instructions provided today and declined a print copy of patient instruction materials.   Plan: - Will collaborate w/ Dr. Irven Shelling office as above. If I do not hear back, will outreach again in ~ 1 week  Catie Darnelle Maffucci, PharmD, [place], [place] Pharmacist [place] [phone]

## 2020-04-03 NOTE — Chronic Care Management (AMB) (Signed)
**Note Daisy-Identified via Obfuscation** Chronic Care Management   Note  04/03/2020 Name: Daisy [name] MRN: [id number] DOB: 11-Jan-1951   Subjective:  Daisy [name] is a 69 y.o. year old female who is a primary care patient of Daisy Regal, NP. The CCM team was consulted for assistance with chronic disease management and care coordination needs.     Contacted patient for initial medication access and medication management support  [name] was given information about Chronic Care Management services today including:  1. CCM service includes personalized support from designated clinical staff supervised by her physician, including individualized plan of care and coordination with other care providers 2. 24/7 contact phone numbers for assistance for urgent and routine care needs. 3. Service will only be billed when office clinical staff spend 20 minutes or more in a month to coordinate care. 4. Only one practitioner may furnish and bill the service in a calendar month. 5. The patient may stop CCM services at any time (effective at the end of the month) by phone call to the office staff. 6. The patient will be responsible for cost sharing (co-pay) of up to 20% of the service fee (after annual deductible is met).  Patient agreed to services and verbal consent obtained.   Review of patient status, including review of consultants reports, laboratory and other test data, was performed as part of comprehensive evaluation and provision of chronic care management services.   SDOH (Social Determinants of Health) assessments and interventions performed:  SDOH Interventions     Most Recent Value  SDOH Interventions  Financial Strain Interventions Intervention Not Indicated       Objective:  Lab Results  Component Value Date   CREATININE 1.45 (H) 02/14/2020   CREATININE 1.54 (H) 01/06/2020   CREATININE 1.34 (H) 11/06/2019    Lab Results  Component Value Date   HGBA1C 6.5 01/06/2020       Component  Value Date/Time   CHOL 265 (H) 01/06/2020 0839   TRIG 133.0 01/06/2020 0839   HDL 56.30 01/06/2020 0839   CHOLHDL 5 01/06/2020 0839   VLDL 26.6 01/06/2020 0839   LDLCALC 182 (H) 01/06/2020 0839    Clinical ASCVD: Yes - PAD The 10-year ASCVD risk score Daisy [name] DC Jr., et al., 2013) is: 27.8%   Values used to calculate the score:     Age: 15 years     Sex: Female     Is Non-Hispanic African American: No     Diabetic: Yes     Tobacco smoker: No     Systolic Blood Pressure: 250 mmHg     Is BP treated: Yes     HDL Cholesterol: 56.3 mg/dL     Total Cholesterol: 265 mg/dL    BP Readings from Last 3 Encounters:  03/26/20 (!) 144/70  02/23/20 138/76  02/09/20 120/72    Allergies  Allergen Reactions   Daisy [name] [Levofloxacin] Other (See Comments)    Body aches   Sulfur Hives   Statins     Medications Reviewed Today    Reviewed by Daisy [name], Daisy [name] (Pharmacist) on 04/03/20 at 51  Med List Status: <None>  Medication Order Taking? Sig Documenting Provider Last Dose Status Informant  acetaminophen (TYLENOL) 325 MG tablet [id number] Yes Take 2 tablets (650 mg total) by mouth every 4 (four) hours as needed for headache or mild pain. Daisy Shi, [name] Taking Active Family Member  albuterol [facility] HFA) [phone] Base) MCG/ACT inhaler [id number] Yes Inhale 2 puffs into the lungs every 6 (  six) hours as needed for wheezing or shortness of breath. Daisy Regal, NP Taking Active   anastrozole (ARIMIDEX) 1 MG tablet [id number] Yes TAKE 1 TABLET BY MOUTH EVERY DAY Daisy [name] Taking Active   aspirin 81 MG chewable tablet [id number] Yes Chew 81 mg by mouth daily. Provider, Historical, [name] Taking Active Family Member  Bempedoic Acid-Ezetimibe (NEXLIZET) 180-10 MG TABS [id number] No Take 1 tablet by mouth daily.  Patient not taking: Reported on 04/03/2020   Alethia Berthold, Daisy [name] Not Taking Active   Blood Glucose Monitoring Suppl (ACCU-CHEK GUIDE) w/Device KIT [id number]  Yes  Provider, Historical, [name] Taking Active   cilostazol (PLETAL) 100 MG tablet [id number] Yes Take 1 tablet (100 mg total) by mouth 2 (two) times daily. Daisy Prows, [name] Taking Active Family Member  diclofenac sodium (VOLTAREN) 1 % GEL [id number]  Apply 4 g topically 4 (four) times daily as needed (knee and Daisy [name] pain). Daisy [name] and Daisy [name] for pain Provider, Historical, [name]  Active Family Member  meclizine (ANTIVERT) 25 MG tablet [id number] Yes Take 25 mg by mouth 3 (three) times daily as needed for dizziness. Provider, Historical, [name] Taking Active   metoprolol succinate (TOPROL-XL) 25 MG 24 hr tablet [id number] Yes Take 0.5 tablets (12.5 mg total) by mouth daily. Daisy Regal, NP Taking Active   Multiple Vitamins-Minerals (PRESERVISION AREDS 2 PO) [id number] Yes Take 1 tablet by mouth daily.  Provider, Historical, [name] Taking Active Family Member  pantoprazole (PROTONIX) 20 MG tablet [id number] Yes Take 1 tablet (20 mg total) by mouth daily. Daisy Regal, NP Taking Active   potassium chloride SA (KLOR-CON M20) 20 MEQ tablet [id number] Yes Take 1 tablet (20 mEq total) by mouth daily. Daisy Regal, NP Taking Active   torsemide (DEMADEX) 10 MG tablet [id number] Yes TAKE 1 TABLET BY MOUTH TWICE A DAY Daisy Regal, NP Taking Active            Med Note ([place] Apr 03, 2020  2:26 PM) QAM  vitamin B-12 (CYANOCOBALAMIN) 500 MCG tablet [id number] Yes Take by mouth. Provider, Historical, [name] Taking Active            Assessment:   Goals Addressed            This Visit's Progress    PharmD "I need help affording this medication"       CARE PLAN ENTRY (see longitudinal plan of care for additional care plan information)  Current Barriers:   Social, financial, community barriers:  o Unable to afford Nexlizet. She notes that Daisy [name] told her the medication "was not covered" and that they would communicate this to her provider. May have been a PA requirement. Patient never discussed  w/ PCP  Polypharmacy; complex patient with multiple comorbidities including HFpEF, HTN, COPD, CKD, obesity hypoventilation syndrome, hx ER postive breast cancer, PAD s/p stenting  Most recent eGFR: ~37 mL/min o PAD/HLD: follows w/ Daisy [name]. Prescribed bempidoic acid/ezetimibe 180/10 mg daily. Given 3 weeks of samples, and she believes she received active drug in a previously clinical trial. Most recent LDL 182, TC 265. Hx intolerance to statins (rosuvastatin 10 mg daily, atorvastatin 10 mg,  o PAD: cilostazol 100 mg BID, ASA 81 mg daily  o HFpEF: exacerbation requiring hospitalization in 10/2019. Did not tolerate Entresto d/t hypotension. Metoprolol succinate 12.5 mg daily, torsemide 10 mg daily, potassium 20 mEq  o Hx ER positive breast cancer: anastrazole 1 mg daily  o Supplement: meclizine PRN  vertigo  Pharmacist Clinical Goal(s):   Over the next 90 days, patient will work with PharmD and provider towards medication affordability  Interventions:  Comprehensive medication review performed; medication list updated in electronic medical record  Inter-disciplinary care team collaboration (see longitudinal plan of care)  Contacted Dr. Irven Shelling office to inquire if PA was completed for Nexlizet. Upon review of [place] documents, Nexlizet appears to be Tier 3, but Repatha may be Tier 2. Will communicate that information to cardiology.   Cilostazol is contraindicated in heart failure d/t increased risk of MI, though data more in HFrEF. Left message for Daisy [name] to discuss. Concern for continuing this medication in patient with other risk factors for MI (hyperlipidemia)  Patient Self Care Activities:   Patient will take medications as prescribed  Patient will collaborate w/ interdiscplinary team   Initial goal documentation        Plan: - Will collaborate w/ Dr. Irven Shelling office as above. If I do not hear Daisy [name], will outreach again in ~ 1 week  Catie Darnelle Maffucci, PharmD,  [place], [place] Pharmacist [place] [phone]

## 2020-04-06 DIAGNOSIS — J9622 Acute and chronic respiratory failure with hypercapnia: Secondary | ICD-10-CM | POA: Diagnosis not present

## 2020-04-06 DIAGNOSIS — J9621 Acute and chronic respiratory failure with hypoxia: Secondary | ICD-10-CM | POA: Diagnosis not present

## 2020-04-06 DIAGNOSIS — E662 Morbid (severe) obesity with alveolar hypoventilation: Secondary | ICD-10-CM | POA: Diagnosis not present

## 2020-04-07 DIAGNOSIS — J9621 Acute and chronic respiratory failure with hypoxia: Secondary | ICD-10-CM | POA: Diagnosis not present

## 2020-04-07 DIAGNOSIS — J9622 Acute and chronic respiratory failure with hypercapnia: Secondary | ICD-10-CM | POA: Diagnosis not present

## 2020-04-07 DIAGNOSIS — E662 Morbid (severe) obesity with alveolar hypoventilation: Secondary | ICD-10-CM | POA: Diagnosis not present

## 2020-04-08 DIAGNOSIS — I5032 Chronic diastolic (congestive) heart failure: Secondary | ICD-10-CM | POA: Diagnosis not present

## 2020-04-08 DIAGNOSIS — E662 Morbid (severe) obesity with alveolar hypoventilation: Secondary | ICD-10-CM | POA: Diagnosis not present

## 2020-04-08 DIAGNOSIS — J449 Chronic obstructive pulmonary disease, unspecified: Secondary | ICD-10-CM | POA: Diagnosis not present

## 2020-04-08 DIAGNOSIS — J9622 Acute and chronic respiratory failure with hypercapnia: Secondary | ICD-10-CM | POA: Diagnosis not present

## 2020-04-08 DIAGNOSIS — I509 Heart failure, unspecified: Secondary | ICD-10-CM | POA: Diagnosis not present

## 2020-04-08 DIAGNOSIS — J9621 Acute and chronic respiratory failure with hypoxia: Secondary | ICD-10-CM | POA: Diagnosis not present

## 2020-04-10 ENCOUNTER — Other Ambulatory Visit: Payer: Self-pay

## 2020-04-10 DIAGNOSIS — I739 Peripheral vascular disease, unspecified: Secondary | ICD-10-CM

## 2020-04-10 DIAGNOSIS — E7800 Pure hypercholesterolemia, unspecified: Secondary | ICD-10-CM

## 2020-04-10 MED ORDER — NEXLIZET 180-10 MG PO TABS: 1.0000 | ORAL_TABLET | Freq: Every day | ORAL | 3 refills | Status: DC

## 2020-04-11 DIAGNOSIS — E876 Hypokalemia: Secondary | ICD-10-CM | POA: Diagnosis not present

## 2020-04-11 DIAGNOSIS — R809 Proteinuria, unspecified: Secondary | ICD-10-CM | POA: Diagnosis not present

## 2020-04-11 DIAGNOSIS — E1122 Type 2 diabetes mellitus with diabetic chronic kidney disease: Secondary | ICD-10-CM | POA: Diagnosis not present

## 2020-04-11 DIAGNOSIS — I129 Hypertensive chronic kidney disease with stage 1 through stage 4 chronic kidney disease, or unspecified chronic kidney disease: Secondary | ICD-10-CM | POA: Diagnosis not present

## 2020-04-11 DIAGNOSIS — N1832 Chronic kidney disease, stage 3b: Secondary | ICD-10-CM | POA: Diagnosis not present

## 2020-04-18 ENCOUNTER — Other Ambulatory Visit: Payer: Self-pay | Admitting: Nurse Practitioner

## 2020-04-20 DIAGNOSIS — Z853 Personal history of malignant neoplasm of breast: Secondary | ICD-10-CM | POA: Diagnosis not present

## 2020-04-20 DIAGNOSIS — R921 Mammographic calcification found on diagnostic imaging of breast: Secondary | ICD-10-CM | POA: Diagnosis not present

## 2020-04-23 ENCOUNTER — Other Ambulatory Visit: Payer: Self-pay | Admitting: Nurse Practitioner

## 2020-04-23 DIAGNOSIS — E662 Morbid (severe) obesity with alveolar hypoventilation: Secondary | ICD-10-CM | POA: Diagnosis not present

## 2020-04-23 DIAGNOSIS — J9621 Acute and chronic respiratory failure with hypoxia: Secondary | ICD-10-CM | POA: Diagnosis not present

## 2020-04-23 DIAGNOSIS — J9622 Acute and chronic respiratory failure with hypercapnia: Secondary | ICD-10-CM | POA: Diagnosis not present

## 2020-04-26 ENCOUNTER — Other Ambulatory Visit: Payer: Self-pay | Admitting: *Deleted

## 2020-04-26 DIAGNOSIS — C50412 Malignant neoplasm of upper-outer quadrant of left female breast: Secondary | ICD-10-CM

## 2020-04-26 DIAGNOSIS — Z170 Estrogen receptor positive status [ER+]: Secondary | ICD-10-CM

## 2020-04-26 NOTE — Progress Notes (Signed)
Daisy [name]  [phone] [phone]     ID: Daisy [name] DOB: 01/03/1951  MR#: [id number]  JYN#:[id number]  Patient Care Team: Marval Regal, NP as PCP - General (Nurse Practitioner) Rolm Bookbinder, MD as Consulting Physician (General Surgery) Aamira Bischoff, Virgie Dad, MD as Consulting Physician (Oncology) Gery Pray, MD as Consulting Physician (Radiation Oncology) Lorelle Gibbs, MD (Radiology) Armbruster, Carlota Raspberry, MD as Consulting Physician (Gastroenterology) Adrian Prows, MD as Consulting Physician (Cardiology) Earlie Server, MD as Consulting Physician (Orthopedic Surgery) Rigoberto Noel, MD as Consulting Physician (Pulmonary Disease) De Hollingshead, RPH-CPP (Pharmacist) Lavonia Dana, MD as Consulting Physician (Nephrology) Chauncey Cruel, MD OTHER MD:  CHIEF COMPLAINT: Estrogen receptor positive breast cancer  CURRENT TREATMENT: Anastrozole   INTERVAL HISTORY: [name] returns today for follow-up of her estrogen receptor positive breast cancer accompanied by her daughter [name].  She continues on anastrozole, with good tolerance.  Currently she is spending less than $20 for a 46-monthsupply.  She does not have problems with hot flashes or vaginal dryness issues.  She does have some urethral irritation and some urinary tract problems which have been taking care of with a couple of rounds of antibiotics through her primary care physician  [facility] the breast density to be category A.  There are  calcifications in the left breast which are felt to be probably benign but a follow-up left breast mammogram has been scheduled for 10/20/2020.   REVIEW OF SYSTEMS: LVaughan Basta   COVID 19 VACCINATION STATUS: fully vaccinated (Therapist, music   BREAST CANCER HISTORY: From the original intake note:  [facility] The breast density was  category A. At the 12:00 location anteriorly in the left breast there was an area of linear calcifications measuring 0.3 cm. There was also an area of architectural distortion in the left breast upper outer quadrant. On 09/23/2016 the patient underwent left diagnostic mammography and this confirmed an area of calcifications and architectural distortion. Accordingly on 09/24/2016 the patient underwent left breast ultrasonography which did not show any abnormality in the left breast or left axilla.  On 09/24/2016 the patient underwent biopsy of the left breast area of architectural distortion and this showed ([id number]) invasive ductal carcinoma, grade 1, estrogen receptor 100% positive, and progesterone receptor 100% positive, both with strong staining intensity, with an MIB-1 of 5%, and no HER-2 amplification, the signals ratio being 1.51 and the number per cell 2.65.  The patient's subsequent history is as detailed below.   PAST MEDICAL HISTORY: Past Medical History:  Diagnosis Date  . Acid reflux   . Acute heart failure ([facility] 10/17/2019  . AKI (acute kidney injury) ([facility] 10/17/2019  . Arthritis   . Breast cancer of upper-outer quadrant of right female breast ([facility] 11/19/2016  . Chicken pox   . Cholecystitis   . Chronic diastolic heart failure ([facility] 01/04/2020  . CKD (chronic kidney disease), stage III ([facility] 01/04/2020  . Depression   . Dyspnea   . History of radiation therapy 01/20/17-03/02/17   left breast was treated to 50.4 Gy in 18 fractions  . HLD (hyperlipidemia) 01/04/2020  . Hyperlipemia   . Irregular heart rate   . Malignant neoplasm of upper-outer quadrant of left breast in female, estrogen receptor positive ([facility] 09/30/2016  . Morbid obesity with BMI of 50.0-59.9, adult ([facility] 01/04/2020  . Obesity   . Peripheral vascular disease ([facility]   . Pneumonia   .  Prediabetes 01/04/2020  . Urinary tract infection   . Urinary tract infection with hematuria     PAST SURGICAL HISTORY: Past  Surgical History:  Procedure Laterality Date  . ABDOMINAL HYSTERECTOMY    . BREAST LUMPECTOMY Left 11/19/2016   BREAST LUMPECTOMY WITH RADIOACTIVE SEED AND SENTINEL LYMPH NODE BIOPSY (Left)  . BREAST LUMPECTOMY WITH RADIOACTIVE SEED AND SENTINEL LYMPH NODE BIOPSY Left 11/19/2016   Procedure: BREAST LUMPECTOMY WITH RADIOACTIVE SEED AND SENTINEL LYMPH NODE BIOPSY;  Surgeon: Rolm Bookbinder, MD;  Location: [place];  Service: General;  Laterality: Left;  . CESAREAN SECTION    . CESAREAN SECTION     x 2  . CHOLECYSTECTOMY  2007  . KNEE ARTHROSCOPY     bilateral  . KNEE SURGERY Bilateral   . LOWER EXTREMITY ANGIOGRAM N/A 04/04/2014   Procedure: LOWER EXTREMITY ANGIOGRAM;  Surgeon: Laverda Page, MD;  [facility] C[facility] LAB;  Service: Cardiovascular;  Laterality: N/A;  . OVARIAN CYST REMOVAL    . RE-EXCISION OF BREAST LUMPECTOMY Left 12/12/2016   Procedure: RE-EXCISION OF LEFT BREAST LUMPECTOMY;  Surgeon: Rolm Bookbinder, MD;  Location: [place];  Service: General;  Laterality: Left;  . ROTATOR CUFF REPAIR Right   . TONSILLECTOMY    . WRIST SURGERY Bilateral     FAMILY HISTORY Family History  Problem Relation Age of Onset  . Heart disease Mother   . Lung cancer Father   . Heart disease Father   . Heart disease Brother   The patient's father died at age 72 from lung cancer in the setting of tobacco abuse. The patient's mother died at the age of 86 from pneumonia. The patient has 3 brothers, 1 sister. There is no history of breast or ovarian cancer in the family.   GYNECOLOGIC HISTORY:  No LMP recorded. Patient has had a hysterectomy. Menarche age 6, first live birth age 43. The patient is GX P2. She underwent simple hysterectomy without salpingo-oophorectomy in 1986. She did not take hormone replacement. She did use oral contraceptives remotely for about 5 years, with no complications.   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 She now lives  at home by herself along with her dog Daisy [name]. Her daughter Daisy [name] worked as a Audiological scientist for 30 years but retired 2020 and now works for [employer] in [place]. Daughter [name] lives in [place] and works as a Gaffer. The patient has no grandchildren. She is a Psychologist, forensic.    ADVANCED DIRECTIVES: Not in place. The patient was given the appropriate documents to complete and notarize at her discretion in course of her 10/01/2016 visit   HEALTH MAINTENANCE: Social History   Tobacco Use  . Smoking status: Former Smoker    Packs/day: 0.50    Years: 30.00    Pack years: 15.00    Types: E-cigarettes    Quit date: 11/02/2019    Years since quitting: 0.4  . Smokeless tobacco: Never Used  Vaping Use  . Vaping Use: Never used  Substance Use Topics  . Alcohol use: No  . Drug use: No     Colonoscopy:Never  PAP:  Bone density: Never   Allergies  Allergen Reactions  . Levaquin [Levofloxacin] Other (See Comments)    Body aches  . Sulfur Hives  . Statins     Current Outpatient Medications  Medication Sig Dispense Refill  . acetaminophen (TYLENOL) 325 MG tablet Take 2 tablets (650 mg total) by mouth every 4 (four)  hours as needed for headache or mild pain.    Marland Kitchen albuterol (PROAIR HFA) 108 (90 Base) MCG/ACT inhaler Inhale 2 puffs into the lungs every 6 (six) hours as needed for wheezing or shortness of breath. 18 g 3  . anastrozole (ARIMIDEX) 1 MG tablet Take 1 tablet (1 mg total) by mouth daily. 90 tablet 4  . aspirin 81 MG chewable tablet Chew 81 mg by mouth daily.    . Bempedoic Acid-Ezetimibe (NEXLIZET) 180-10 MG TABS Take 1 tablet by mouth daily. 90 tablet 3  . Blood Glucose Monitoring Suppl (ACCU-CHEK GUIDE) w/Device KIT     . cilostazol (PLETAL) 100 MG tablet Take 1 tablet (100 mg total) by mouth 2 (two) times daily. 180 tablet 3  . diclofenac sodium (VOLTAREN) 1 % GEL Apply 4 g topically 4 (four) times daily as needed (knee and back pain). Knees and back for pain  5  .  KLOR-CON M20 20 MEQ tablet TAKE 1 TABLET BY MOUTH EVERY DAY 90 tablet 0  . meclizine (ANTIVERT) 25 MG tablet Take 25 mg by mouth 3 (three) times daily as needed for dizziness.    . metoprolol succinate (TOPROL-XL) 25 MG 24 hr tablet Take 0.5 tablets (12.5 mg total) by mouth daily. 90 tablet 0  . Multiple Vitamins-Minerals (PRESERVISION AREDS 2 PO) Take 1 tablet by mouth daily.     . pantoprazole (PROTONIX) 20 MG tablet Take 1 tablet (20 mg total) by mouth daily. 90 tablet 0  . torsemide (DEMADEX) 10 MG tablet TAKE 1 TABLET BY MOUTH TWICE A DAY 60 tablet 1  . vitamin B-12 (CYANOCOBALAMIN) 500 MCG tablet Take by mouth.     No current facility-administered medications for this visit.    OBJECTIVE: Morbidly obese white woman examined in a wheelchair  Vitals:   04/27/20 1200  BP: 106/62  Pulse: 80  Resp: 18  Temp: (!) 97 F (36.1 C)  SpO2: 93%     Body mass index is 51 kg/m.    ECOG FS:2 - Symptomatic, <50% confined to bed  Sclerae unicteric, EOMs intact Wearing a mask No cervical or supraclavicular adenopathy Lungs no rales or rhonchi Heart regular rate and rhythm Abd soft, obese, nontender, positive bowel sounds MSK no focal spinal tenderness, no upper extremity lymphedema Neuro: nonfocal, well oriented, appropriate affect Breasts: The right breast is benign.  The left breast is status post lumpectomy followed by radiation.  There is no evidence of local recurrence.  Both axillae are benign.  LAB RESULTS:  CMP     Component Value Date/Time   NA 140 04/27/2020 1136   NA 143 02/14/2020 1024   NA 137 02/27/2017 1007   K 4.3 04/27/2020 1136   K 4.4 02/27/2017 1007   CL 102 04/27/2020 1136   CL 105 04/08/2014 0355   CO2 29 04/27/2020 1136   CO2 32 (H) 02/27/2017 1007   GLUCOSE 121 (H) 04/27/2020 1136   GLUCOSE 127 02/27/2017 1007   BUN 22 04/27/2020 1136   BUN 17 02/14/2020 1024   BUN 7.7 02/27/2017 1007   CREATININE 2.02 (H) 04/27/2020 1136   CREATININE 0.9 02/27/2017  1007   CALCIUM 9.8 04/27/2020 1136   CALCIUM 10.2 02/27/2017 1007   PROT 7.8 04/27/2020 1136   PROT 7.0 02/27/2017 1007   ALBUMIN 3.5 04/27/2020 1136   ALBUMIN 3.3 (L) 02/27/2017 1007   AST 14 (L) 04/27/2020 1136   AST 14 02/27/2017 1007   ALT 9 04/27/2020 1136   ALT 9 02/27/2017  1007   ALKPHOS 79 04/27/2020 1136   ALKPHOS 78 02/27/2017 1007   BILITOT 0.6 04/27/2020 1136   BILITOT 0.60 02/27/2017 1007   GFRNONAA 26 (L) 04/27/2020 1136   GFRNONAA 56 (L) 04/08/2014 0355   GFRAA 42 (L) 02/14/2020 1024   GFRAA >60 04/08/2014 0355    No results found for: TOTALPROTELP, ALBUMINELP, A1GS, A2GS, BETS, BETA2SER, GAMS, MSPIKE, SPEI  No results found for: Nils Pyle, [facility]  Lab Results  Component Value Date   WBC 7.1 04/27/2020   NEUTROABS 4.3 04/27/2020   HGB 13.2 04/27/2020   HCT 41.9 04/27/2020   MCV 95.2 04/27/2020   PLT 302 04/27/2020      Chemistry      Component Value Date/Time   NA 140 04/27/2020 1136   NA 143 02/14/2020 1024   NA 137 02/27/2017 1007   K 4.3 04/27/2020 1136   K 4.4 02/27/2017 1007   CL 102 04/27/2020 1136   CL 105 04/08/2014 0355   CO2 29 04/27/2020 1136   CO2 32 (H) 02/27/2017 1007   BUN 22 04/27/2020 1136   BUN 17 02/14/2020 1024   BUN 7.7 02/27/2017 1007   CREATININE 2.02 (H) 04/27/2020 1136   CREATININE 0.9 02/27/2017 1007      Component Value Date/Time   CALCIUM 9.8 04/27/2020 1136   CALCIUM 10.2 02/27/2017 1007   ALKPHOS 79 04/27/2020 1136   ALKPHOS 78 02/27/2017 1007   AST 14 (L) 04/27/2020 1136   AST 14 02/27/2017 1007   ALT 9 04/27/2020 1136   ALT 9 02/27/2017 1007   BILITOT 0.6 04/27/2020 1136   BILITOT 0.60 02/27/2017 1007     No results found for: LABCA2  No components found for: QIHKVQ259  No results for input(s): INR in the last 168 hours.  Urinalysis    Component Value Date/Time   COLORURINE YELLOW 10/17/2019 1429   APPEARANCEUR CLOUDY (A) 10/17/2019 1429   APPEARANCEUR Cloudy 04/07/2014 2005    LABSPEC 1.010 10/17/2019 1429   LABSPEC 1.006 04/07/2014 2005   PHURINE 6.0 10/17/2019 1429   GLUCOSEU NEGATIVE 10/17/2019 1429   GLUCOSEU Negative 04/07/2014 2005   HGBUR SMALL (A) 10/17/2019 1429   BILIRUBINUR neg 03/26/2020 1109   BILIRUBINUR Negative 04/07/2014 2005   KETONESUR NEGATIVE 10/17/2019 1429   PROTEINUR Negative 03/26/2020 1109   PROTEINUR NEGATIVE 10/17/2019 1429   UROBILINOGEN 2.0 (A) 03/26/2020 1109   NITRITE pos 03/26/2020 1109   NITRITE POSITIVE (A) 10/17/2019 1429   LEUKOCYTESUR Moderate (2+) (A) 03/26/2020 1109   LEUKOCYTESUR LARGE (A) 10/17/2019 1429   LEUKOCYTESUR 3+ 04/07/2014 2005    STUDIES: No results found.     ELIGIBLE FOR AVAILABLE RESEARCH PROTOCOL: no  ASSESSMENT: 69 y.o. [place], [place] woman Status post left breast upper outer quadrant biopsy 09/24/2016 of an area of architectural distortion measuring 3.7 cm, showing invasive ductal carcinoma, grade 1, estrogen and progesterone receptor positive, HER-2 nonamplified, with an MIB-1 of 5%.  (a) a 0.3 cm area of linear calcifications in the superior aspect of the breast Was benign on biopsy 10/02/2016   (1) left lumpectomy and sentinel lymph node sampling 11/19/2016 found ([id number]) invasive ductal carcinoma, measuring 1.9 cm, grade 2, with all 3 sentinel lymph nodes clear. However the medial margin was involved  (a) additional surgery 12/12/2016 cleared the involved margin (as CA [id number]  (2) Oncotype DX  score of 14 predicted a 10 year risk of outside the breast recurrence of 9% if the patient's only systemic therapy is tamoxifen  for 5 years. It also predicted no benefit from chemotherapy.  (3) adjuvant radiation 01/20/2017 to 03/02/2017 Site/dose:   The Left breast was treated to 50.4 Gy in 18 fractions of 1.8 Gy.   (4) anastrozole started April 2018   PLAN: Miaisabella is now a little over 3 years out from definitive surgery for her breast cancer with no evidence of disease recurrence.  This  is very favorable.  She is tolerating anastrozole well unlike tamoxifen this is not associated with an increased risk of clots.  The plan is to continue this medication for a total of 5 years.  She is having significant pulmonary and renal issues and she is followed appropriately for those by pulmonary and nephrology.  She will have a repeat mammography May 2022 to follow the slight change in calcifications is noted otherwise she will have her annual routine mammography in a year and see me shortly after that  Total encounter time 25 minutes.*   Iyanni Hepp, Virgie Dad, MD  04/27/20 12:37 PM Medical Oncology and Hematology [facility] [place], [place] [address] Tel. [phone]    Fax. [phone]    I, Wilburn Mylar, am acting as scribe for Dr. Virgie Dad. Ashtian Villacis.  I, Lurline Del MD, have reviewed the above documentation for accuracy and completeness, and I agree with the above.   *Total Encounter Time as defined by the Centers for Medicare and Medicaid Services includes, in addition to the face-to-face time of a patient visit (documented in the note above) non-face-to-face time: obtaining and reviewing outside history, ordering and reviewing medications, tests or procedures, care coordination (communications with other health care professionals or caregivers) and documentation in the medical record.

## 2020-04-27 ENCOUNTER — Inpatient Hospital Stay (HOSPITAL_BASED_OUTPATIENT_CLINIC_OR_DEPARTMENT_OTHER): Payer: Medicare PPO | Admitting: Oncology

## 2020-04-27 ENCOUNTER — Other Ambulatory Visit: Payer: Self-pay

## 2020-04-27 ENCOUNTER — Inpatient Hospital Stay: Payer: Medicare PPO | Attending: Oncology

## 2020-04-27 VITALS — BP 106/62 | HR 80 | Temp 97.0°F | Resp 18 | Ht 66.0 in

## 2020-04-27 DIAGNOSIS — Z6841 Body Mass Index (BMI) 40.0 and over, adult: Secondary | ICD-10-CM

## 2020-04-27 DIAGNOSIS — I10 Essential (primary) hypertension: Secondary | ICD-10-CM | POA: Diagnosis not present

## 2020-04-27 DIAGNOSIS — Z79899 Other long term (current) drug therapy: Secondary | ICD-10-CM | POA: Insufficient documentation

## 2020-04-27 DIAGNOSIS — C50412 Malignant neoplasm of upper-outer quadrant of left female breast: Secondary | ICD-10-CM

## 2020-04-27 DIAGNOSIS — Z170 Estrogen receptor positive status [ER+]: Secondary | ICD-10-CM | POA: Insufficient documentation

## 2020-04-27 DIAGNOSIS — Z87891 Personal history of nicotine dependence: Secondary | ICD-10-CM | POA: Insufficient documentation

## 2020-04-27 DIAGNOSIS — J9612 Chronic respiratory failure with hypercapnia: Secondary | ICD-10-CM | POA: Diagnosis not present

## 2020-04-27 DIAGNOSIS — Z79811 Long term (current) use of aromatase inhibitors: Secondary | ICD-10-CM | POA: Diagnosis not present

## 2020-04-27 DIAGNOSIS — J449 Chronic obstructive pulmonary disease, unspecified: Secondary | ICD-10-CM

## 2020-04-27 LAB — CMP (CANCER CENTER ONLY)
ALT: 9 U/L (ref 0–44)
AST: 14 U/L — ABNORMAL LOW (ref 15–41)
Albumin: 3.5 g/dL (ref 3.5–5.0)
Alkaline Phosphatase: 79 U/L (ref 38–126)
Anion gap: 9 (ref 5–15)
BUN: 22 mg/dL (ref 8–23)
CO2: 29 mmol/L (ref 22–32)
Calcium: 9.8 mg/dL (ref 8.9–10.3)
Chloride: 102 mmol/L (ref 98–111)
Creatinine: 2.02 mg/dL — ABNORMAL HIGH (ref 0.44–1.00)
GFR, Estimated: 26 mL/min — ABNORMAL LOW
Glucose, Bld: 121 mg/dL — ABNORMAL HIGH (ref 70–99)
Potassium: 4.3 mmol/L (ref 3.5–5.1)
Sodium: 140 mmol/L (ref 135–145)
Total Bilirubin: 0.6 mg/dL (ref 0.3–1.2)
Total Protein: 7.8 g/dL (ref 6.5–8.1)

## 2020-04-27 LAB — CBC WITH DIFFERENTIAL (CANCER CENTER ONLY)
Abs Immature Granulocytes: 0.02 10*3/uL (ref 0.00–0.07)
Basophils Absolute: 0 10*3/uL (ref 0.0–0.1)
Basophils Relative: 1 %
Eosinophils Absolute: 0.2 10*3/uL (ref 0.0–0.5)
Eosinophils Relative: 3 %
HCT: 41.9 % (ref 36.0–46.0)
Hemoglobin: 13.2 g/dL (ref 12.0–15.0)
Immature Granulocytes: 0 %
Lymphocytes Relative: 29 %
Lymphs Abs: 2.1 10*3/uL (ref 0.7–4.0)
MCH: 30 pg (ref 26.0–34.0)
MCHC: 31.5 g/dL (ref 30.0–36.0)
MCV: 95.2 fL (ref 80.0–100.0)
Monocytes Absolute: 0.5 10*3/uL (ref 0.1–1.0)
Monocytes Relative: 7 %
Neutro Abs: 4.3 10*3/uL (ref 1.7–7.7)
Neutrophils Relative %: 60 %
Platelet Count: 302 10*3/uL (ref 150–400)
RBC: 4.4 MIL/uL (ref 3.87–5.11)
RDW: 13.9 % (ref 11.5–15.5)
WBC Count: 7.1 10*3/uL (ref 4.0–10.5)
nRBC: 0 % (ref 0.0–0.2)

## 2020-04-27 MED ORDER — ANASTROZOLE 1 MG PO TABS
1.0000 mg | ORAL_TABLET | Freq: Every day | ORAL | 4 refills | Status: DC
Start: 2020-04-27 — End: 2021-05-22

## 2020-04-30 ENCOUNTER — Encounter: Payer: Self-pay | Admitting: *Deleted

## 2020-04-30 ENCOUNTER — Telehealth: Payer: Self-pay | Admitting: Oncology

## 2020-04-30 NOTE — Telephone Encounter (Signed)
Scheduled appts per 11/12 los. Pt confirmed appt date and time.

## 2020-04-30 NOTE — Progress Notes (Signed)
Per MD request, recent lab work successfully routed to pt PCP Denice Paradise, NP via epic.

## 2020-05-02 ENCOUNTER — Telehealth: Payer: Self-pay | Admitting: Nurse Practitioner

## 2020-05-02 DIAGNOSIS — I509 Heart failure, unspecified: Secondary | ICD-10-CM | POA: Diagnosis not present

## 2020-05-02 DIAGNOSIS — E662 Morbid (severe) obesity with alveolar hypoventilation: Secondary | ICD-10-CM | POA: Diagnosis not present

## 2020-05-02 DIAGNOSIS — J9622 Acute and chronic respiratory failure with hypercapnia: Secondary | ICD-10-CM | POA: Diagnosis not present

## 2020-05-02 DIAGNOSIS — J9621 Acute and chronic respiratory failure with hypoxia: Secondary | ICD-10-CM | POA: Diagnosis not present

## 2020-05-02 NOTE — Telephone Encounter (Signed)
Labs by Oncology show Cr 2.02 and GFR 26% down from Cr 1.54 and GFR  37%. I contacted Dr.Kolluru via [place] and he acknowledges that her diuretics were increased and placed on an ARB. He is seeing her soon and will address this worsening CKD.

## 2020-05-03 ENCOUNTER — Telehealth: Payer: Self-pay

## 2020-05-03 NOTE — Chronic Care Management (AMB) (Signed)
  Care Management   Note  05/03/2020 Name: Tierney Behl MRN: [id number] DOB: [birth year]  [name] is a 69 y.o. year old female who is a primary care patient of Marval Regal, NP and is actively engaged with the care management team. I reached out to [name] by phone today to assist with re-scheduling a follow up visit with the Pharmacist  Follow up plan:  Patients PCP is leaving the practice and she will be going to another PCP at another practice. The patient has been provided with contact information for the care management team and has been advised to call with any health related questions or concerns.   Noreene Larsson, [place], [place], [place] [address] Direct Dial: [phone] [email or web address]: [email or web address]

## 2020-05-06 ENCOUNTER — Other Ambulatory Visit: Payer: Self-pay | Admitting: Cardiology

## 2020-05-07 DIAGNOSIS — E662 Morbid (severe) obesity with alveolar hypoventilation: Secondary | ICD-10-CM | POA: Diagnosis not present

## 2020-05-07 DIAGNOSIS — J9621 Acute and chronic respiratory failure with hypoxia: Secondary | ICD-10-CM | POA: Diagnosis not present

## 2020-05-07 DIAGNOSIS — J9622 Acute and chronic respiratory failure with hypercapnia: Secondary | ICD-10-CM | POA: Diagnosis not present

## 2020-05-08 DIAGNOSIS — E662 Morbid (severe) obesity with alveolar hypoventilation: Secondary | ICD-10-CM | POA: Diagnosis not present

## 2020-05-08 DIAGNOSIS — J9621 Acute and chronic respiratory failure with hypoxia: Secondary | ICD-10-CM | POA: Diagnosis not present

## 2020-05-08 DIAGNOSIS — J9622 Acute and chronic respiratory failure with hypercapnia: Secondary | ICD-10-CM | POA: Diagnosis not present

## 2020-05-09 DIAGNOSIS — J9621 Acute and chronic respiratory failure with hypoxia: Secondary | ICD-10-CM | POA: Diagnosis not present

## 2020-05-09 DIAGNOSIS — J9622 Acute and chronic respiratory failure with hypercapnia: Secondary | ICD-10-CM | POA: Diagnosis not present

## 2020-05-09 DIAGNOSIS — I509 Heart failure, unspecified: Secondary | ICD-10-CM | POA: Diagnosis not present

## 2020-05-09 DIAGNOSIS — J449 Chronic obstructive pulmonary disease, unspecified: Secondary | ICD-10-CM | POA: Diagnosis not present

## 2020-05-09 DIAGNOSIS — I5032 Chronic diastolic (congestive) heart failure: Secondary | ICD-10-CM | POA: Diagnosis not present

## 2020-05-09 DIAGNOSIS — E662 Morbid (severe) obesity with alveolar hypoventilation: Secondary | ICD-10-CM | POA: Diagnosis not present

## 2020-05-16 ENCOUNTER — Encounter: Payer: Self-pay | Admitting: Nurse Practitioner

## 2020-05-16 DIAGNOSIS — I129 Hypertensive chronic kidney disease with stage 1 through stage 4 chronic kidney disease, or unspecified chronic kidney disease: Secondary | ICD-10-CM | POA: Diagnosis not present

## 2020-05-16 DIAGNOSIS — I509 Heart failure, unspecified: Secondary | ICD-10-CM | POA: Diagnosis not present

## 2020-05-16 DIAGNOSIS — N1832 Chronic kidney disease, stage 3b: Secondary | ICD-10-CM | POA: Diagnosis not present

## 2020-05-16 DIAGNOSIS — R809 Proteinuria, unspecified: Secondary | ICD-10-CM | POA: Diagnosis not present

## 2020-05-16 DIAGNOSIS — E876 Hypokalemia: Secondary | ICD-10-CM | POA: Diagnosis not present

## 2020-05-16 DIAGNOSIS — E1122 Type 2 diabetes mellitus with diabetic chronic kidney disease: Secondary | ICD-10-CM | POA: Diagnosis not present

## 2020-05-23 ENCOUNTER — Other Ambulatory Visit: Payer: Self-pay

## 2020-05-23 ENCOUNTER — Encounter: Payer: Self-pay | Admitting: Pulmonary Disease

## 2020-05-23 ENCOUNTER — Ambulatory Visit (INDEPENDENT_AMBULATORY_CARE_PROVIDER_SITE_OTHER): Payer: Medicare PPO | Admitting: Pulmonary Disease

## 2020-05-23 DIAGNOSIS — J9611 Chronic respiratory failure with hypoxia: Secondary | ICD-10-CM

## 2020-05-23 DIAGNOSIS — J9612 Chronic respiratory failure with hypercapnia: Secondary | ICD-10-CM | POA: Diagnosis not present

## 2020-05-23 DIAGNOSIS — I5032 Chronic diastolic (congestive) heart failure: Secondary | ICD-10-CM

## 2020-05-23 DIAGNOSIS — Z23 Encounter for immunization: Secondary | ICD-10-CM | POA: Diagnosis not present

## 2020-05-23 DIAGNOSIS — E662 Morbid (severe) obesity with alveolar hypoventilation: Secondary | ICD-10-CM | POA: Diagnosis not present

## 2020-05-23 DIAGNOSIS — J9621 Acute and chronic respiratory failure with hypoxia: Secondary | ICD-10-CM | POA: Diagnosis not present

## 2020-05-23 DIAGNOSIS — J9622 Acute and chronic respiratory failure with hypercapnia: Secondary | ICD-10-CM | POA: Diagnosis not present

## 2020-05-23 MED ORDER — TORSEMIDE 10 MG PO TABS
10.0000 mg | ORAL_TABLET | Freq: Two times a day (BID) | ORAL | 1 refills | Status: DC
Start: 2020-05-23 — End: 2020-07-24

## 2020-05-23 NOTE — Assessment & Plan Note (Addendum)
Continue torsemide once daily, weight has increased by 10 pounds since last office visit 01/2020  if weight increases by 5 pounds she should increase to twice daily

## 2020-05-23 NOTE — Progress Notes (Signed)
   Subjective:    Patient ID: Daisy [name], female    DOB: 1950/09/23, 69 y.o.   MRN: [id number]  HPI  69-y.o smoker for FU COPD, OSA and acute on chronic hypercarbic and hypoxic respiratory failure  She smokes starting in her 25s until the point of admission, more than 40 pack years 69Admitted5/2021with working diagnosis of decompensated diastolic heart failure and cor pulmonale in the setting of untreated OSA/ OHS   ABG 5/4 was 7.17/108/89 -She was initially diuresed and treated with BiPAP,was discharged to [facility] [facility] home but readmitted since BiPAP could not be supplied,she was readmitted 5/19-5/24 and eventually discharged on trilogy machine with full facemask  ABG at discharge was 7.3 6/70/78  PMH -Morbid obesity, chronic dyspnea, left breast cancer status post radiation, peripheral vascular disease, depression, arthritis, acid reflux disease  Chief Complaint  Patient presents with  . Follow-up    Patient is using Trilogy machine and is wondering if she needs it anymore, patient cant use CPAP and was on Bipap in hospital. 2 liters bled into Trilogy   Breathing has been good, arrives in a wheelchair Decrease torsemide to 10 mg once daily, weight is stable. Compliant with Trelegy, very expensive and wonders if she can use a different type of machine.  She is obtained a friend CPAP and wonders if she can use this.     Significant tests/ events reviewed  NPSG 12/2019 >> no sig OSA , mild O2 desatn 82% lowest  PFTs 01/2020 mild restriction, TLC 80%, ratio 74, FVC 72%, FEV1 70%, DLCO 95%  09/2019: Ejection fraction 65% with possible RV failure and indeterminate diastolic dysfunction.  5/2021esophagram>>small sliding hiatal hernia and mild reflux, mild dysmotility,cannot exclude stricture-barium tablet did not pass, diverticulumin upper cervical esophagus  Review of Systems neg for any significant sore throat, dysphagia, itching, sneezing, nasal  congestion or excess/ purulent secretions, fever, chills, sweats, unintended wt loss, pleuritic or exertional cp, hempoptysis, orthopnea pnd or change in chronic leg swelling. Also denies presyncope, palpitations, heartburn, abdominal pain, nausea, vomiting, diarrhea or change in bowel or urinary habits, dysuria,hematuria, rash, arthralgias, visual complaints, headache, numbness weakness or ataxia.     Objective:   Physical Exam  Gen. Pleasant, obese, in no distress ENT - no lesions, no post nasal drip Neck: No JVD, no thyromegaly, no carotid bruits Lungs: no use of accessory muscles, no dullness to percussion, decreased without rales or rhonchi  Cardiovascular: Rhythm regular, heart sounds  normal, no murmurs or gallops, 1+ peripheral edema Musculoskeletal: No deformities, no cyanosis or clubbing , no tremors       Assessment & Plan:

## 2020-05-23 NOTE — Assessment & Plan Note (Addendum)
This is likely on the basis of obesity hypoventilation.  PFTs have not shown significant airway obstruction. An PSG surprisingly did not show OSA. She has been maintained on Trelegy since her last hospitalization in May with excellent results.  She is very compliant, we reviewed download which shows excellent compliance more than 7 hours per night with average BiPAP settings 15/7 and 40% patient triggered breaths, tidal volumes are good. She is on auto AVAPS mode -we will continue this. She also has 2 L oxygen blended into the machine  Like to obtain an ABG on room air but patient would like to avoid this, last bicarbonate was 29 suggesting that PCO2 is probably in the 50 range.  She will likely do just as well with BiPAP ST but DME requirements would preclude this  Pneumovax today.  Okay to get Covid booster shot

## 2020-05-23 NOTE — Patient Instructions (Signed)
  Pneumovax today. Trilogy machine is working well Please monitor your weight daily and take an extra dose of torsemide if this increases by 5 pounds

## 2020-05-24 ENCOUNTER — Telehealth: Payer: Self-pay | Admitting: Family Medicine

## 2020-05-24 MED ORDER — PANTOPRAZOLE SODIUM 20 MG PO TBEC: 20.0000 mg | DELAYED_RELEASE_TABLET | Freq: Every day | ORAL | 0 refills | Status: DC

## 2020-05-24 NOTE — Telephone Encounter (Signed)
Pt needs a refill on pantoprazole (PROTONIX) 20 MG tablet sent [facility]

## 2020-06-01 DIAGNOSIS — J9621 Acute and chronic respiratory failure with hypoxia: Secondary | ICD-10-CM | POA: Diagnosis not present

## 2020-06-01 DIAGNOSIS — I509 Heart failure, unspecified: Secondary | ICD-10-CM | POA: Diagnosis not present

## 2020-06-01 DIAGNOSIS — E662 Morbid (severe) obesity with alveolar hypoventilation: Secondary | ICD-10-CM | POA: Diagnosis not present

## 2020-06-01 DIAGNOSIS — J9622 Acute and chronic respiratory failure with hypercapnia: Secondary | ICD-10-CM | POA: Diagnosis not present

## 2020-06-05 ENCOUNTER — Other Ambulatory Visit: Payer: Self-pay

## 2020-06-05 DIAGNOSIS — J449 Chronic obstructive pulmonary disease, unspecified: Secondary | ICD-10-CM

## 2020-06-05 MED ORDER — ALBUTEROL SULFATE HFA 108 (90 BASE) MCG/ACT IN AERS: 2.0000 | INHALATION_SPRAY | Freq: Four times a day (QID) | RESPIRATORY_TRACT | 3 refills | Status: DC | PRN

## 2020-06-06 DIAGNOSIS — J9621 Acute and chronic respiratory failure with hypoxia: Secondary | ICD-10-CM | POA: Diagnosis not present

## 2020-06-06 DIAGNOSIS — J9622 Acute and chronic respiratory failure with hypercapnia: Secondary | ICD-10-CM | POA: Diagnosis not present

## 2020-06-06 DIAGNOSIS — E662 Morbid (severe) obesity with alveolar hypoventilation: Secondary | ICD-10-CM | POA: Diagnosis not present

## 2020-06-08 DIAGNOSIS — J9621 Acute and chronic respiratory failure with hypoxia: Secondary | ICD-10-CM | POA: Diagnosis not present

## 2020-06-08 DIAGNOSIS — I509 Heart failure, unspecified: Secondary | ICD-10-CM | POA: Diagnosis not present

## 2020-06-08 DIAGNOSIS — J9622 Acute and chronic respiratory failure with hypercapnia: Secondary | ICD-10-CM | POA: Diagnosis not present

## 2020-06-08 DIAGNOSIS — J449 Chronic obstructive pulmonary disease, unspecified: Secondary | ICD-10-CM | POA: Diagnosis not present

## 2020-06-08 DIAGNOSIS — I5032 Chronic diastolic (congestive) heart failure: Secondary | ICD-10-CM | POA: Diagnosis not present

## 2020-06-08 DIAGNOSIS — E662 Morbid (severe) obesity with alveolar hypoventilation: Secondary | ICD-10-CM | POA: Diagnosis not present

## 2020-06-13 ENCOUNTER — Ambulatory Visit: Payer: Medicare PPO | Admitting: Adult Health

## 2020-06-14 ENCOUNTER — Ambulatory Visit: Payer: Medicare PPO | Admitting: Family Medicine

## 2020-06-17 ENCOUNTER — Other Ambulatory Visit: Payer: Self-pay | Admitting: Family

## 2020-06-18 NOTE — Telephone Encounter (Signed)
Okay to refill? Pt needs new pcp. Previous pt of Daisy [name]

## 2020-06-19 ENCOUNTER — Telehealth: Payer: Self-pay | Admitting: Family

## 2020-06-19 NOTE — Telephone Encounter (Signed)
Call pt She had been pt of Daisy [name] I rec'd request for demadex refill Please advise to have this refill requested through cardiology, if any problems with this , let me know.     ask if she has est with another practice She is due for labs as well

## 2020-06-19 NOTE — Telephone Encounter (Signed)
I called patient & her prescription was from her cardiologist. Not sure what the issue was here. She is establishing with Dr. Einar Pheasant next month.

## 2020-06-23 DIAGNOSIS — E662 Morbid (severe) obesity with alveolar hypoventilation: Secondary | ICD-10-CM | POA: Diagnosis not present

## 2020-06-23 DIAGNOSIS — J9622 Acute and chronic respiratory failure with hypercapnia: Secondary | ICD-10-CM | POA: Diagnosis not present

## 2020-06-23 DIAGNOSIS — J9621 Acute and chronic respiratory failure with hypoxia: Secondary | ICD-10-CM | POA: Diagnosis not present

## 2020-06-26 ENCOUNTER — Ambulatory Visit: Payer: Medicare PPO | Admitting: Nurse Practitioner

## 2020-07-24 ENCOUNTER — Ambulatory Visit (INDEPENDENT_AMBULATORY_CARE_PROVIDER_SITE_OTHER): Payer: Medicare PPO | Admitting: Family Medicine

## 2020-07-24 ENCOUNTER — Other Ambulatory Visit: Payer: Self-pay

## 2020-07-24 ENCOUNTER — Encounter: Payer: Self-pay | Admitting: Family Medicine

## 2020-07-24 VITALS — BP 102/52 | HR 86 | Temp 96.0°F | Ht 67.0 in | Wt 343.8 lb

## 2020-07-24 DIAGNOSIS — J449 Chronic obstructive pulmonary disease, unspecified: Secondary | ICD-10-CM

## 2020-07-24 DIAGNOSIS — I10 Essential (primary) hypertension: Secondary | ICD-10-CM | POA: Diagnosis not present

## 2020-07-24 DIAGNOSIS — N1832 Chronic kidney disease, stage 3b: Secondary | ICD-10-CM

## 2020-07-24 DIAGNOSIS — I5032 Chronic diastolic (congestive) heart failure: Secondary | ICD-10-CM | POA: Diagnosis not present

## 2020-07-24 DIAGNOSIS — K219 Gastro-esophageal reflux disease without esophagitis: Secondary | ICD-10-CM

## 2020-07-24 DIAGNOSIS — Z6841 Body Mass Index (BMI) 40.0 and over, adult: Secondary | ICD-10-CM

## 2020-07-24 DIAGNOSIS — J9611 Chronic respiratory failure with hypoxia: Secondary | ICD-10-CM

## 2020-07-24 DIAGNOSIS — J9612 Chronic respiratory failure with hypercapnia: Secondary | ICD-10-CM

## 2020-07-24 DIAGNOSIS — R7303 Prediabetes: Secondary | ICD-10-CM

## 2020-07-24 LAB — POCT GLYCOSYLATED HEMOGLOBIN (HGB A1C): Hemoglobin A1C: 5.7 % — AB (ref 4.0–5.6)

## 2020-07-24 MED ORDER — TORSEMIDE 10 MG PO TABS: 10.0000 mg | ORAL_TABLET | Freq: Two times a day (BID) | ORAL | 1 refills | Status: DC

## 2020-07-24 MED ORDER — PANTOPRAZOLE SODIUM 20 MG PO TBEC: 20.0000 mg | DELAYED_RELEASE_TABLET | Freq: Every day | ORAL | 1 refills | Status: DC

## 2020-07-24 MED ORDER — METOPROLOL SUCCINATE ER 25 MG PO TB24: 12.5000 mg | ORAL_TABLET | Freq: Every day | ORAL | 1 refills | Status: DC

## 2020-07-24 MED ORDER — POTASSIUM CHLORIDE CRYS ER 20 MEQ PO TBCR: 20.0000 meq | EXTENDED_RELEASE_TABLET | Freq: Every day | ORAL | 1 refills | Status: DC

## 2020-07-24 NOTE — Assessment & Plan Note (Signed)
On losartan 25 mg. Following with nephrology

## 2020-07-24 NOTE — Assessment & Plan Note (Signed)
Follows with Dr. Dedra Skeens. Stable lung disease. Cont Prn albuterol inhaler

## 2020-07-24 NOTE — Assessment & Plan Note (Addendum)
Notes some more swelling in thighs since switching to once daily torsemide. Discussed trial of afternoon torsemide. To see if improves symptoms. Lungs clear. Appreciate cardiology support. Weight is up from 05/23/2020 visit with Dr. [name]

## 2020-07-24 NOTE — Assessment & Plan Note (Signed)
Discussed deconditioning overall - recent fall and had to call EMS to get up. Encouraged consideration for cardiac rehab or some other program for strengthening. They have lift seat and grab bar and encouraged continued walker use. She will discuss rehab consideration with cardiologist at next visit to see what he thinks

## 2020-07-24 NOTE — Assessment & Plan Note (Signed)
BP on low side. On cont metoprolol 12.5 mg for cardiac protection and losartan 25 mg for kidney protection.

## 2020-07-24 NOTE — Assessment & Plan Note (Signed)
Stable per family on 1L O2 overnight. Appreciate Dr. Bari Mantis care. Cont nighttime trelegy

## 2020-07-24 NOTE — Assessment & Plan Note (Signed)
Unable to tolerate stopping pantoprazole. Controlled on pantoprazole 20 mg. Was suppose to see GI a few years ago, discussed consideration for GI follow-up due to persistent symptoms.

## 2020-07-24 NOTE — Progress Notes (Signed)
Subjective:     Daisy [name] is a 70 y.o. female presenting for [place] and fluid retention (In thighs )     HPI  #Fluid retention - in both thighs - feet are fine - has chronic knee pain - admitted in May to [facility] for mental concerns and retained CO2 - was on bipap and in ICU with acute right sided heart failure - on torsemide daily - Daisy [name] - said OK to decrease to 1 per day - has noticed more upper leg swelling over the last month has worsened - was decreased due to the fluid improving  #knee issues - saw DaisyCaffrey  - considering following up - never had surgery  #Breast Cancer - following with Daisy [name]  - cancer was 2018    Review of Systems   Social History   Tobacco Use  Smoking Status Former Smoker  . Packs/day: 1.00  . Years: 30.00  . Pack years: 30.00  . Types: E-cigarettes, Cigarettes  . Quit date: 11/02/2019  . Years since quitting: 0.7  Smokeless Tobacco Never Used        Objective:    BP Readings from Last 3 Encounters:  07/24/20 (!) 102/52  05/23/20 130/66  04/27/20 106/62   Wt Readings from Last 3 Encounters:  07/24/20 (!) 343 lb 12 oz (155.9 kg)  05/23/20 (!) 335 lb (152 kg)  03/26/20 (!) 316 lb (143.3 kg)    BP (!) 102/52   Pulse 86   Temp (!) 96 F (35.6 C) (Temporal)   Ht 5\' 7"  (1.702 m)   Wt (!) 343 lb 12 oz (155.9 kg)   SpO2 96%   BMI 53.84 kg/m    Physical Exam Constitutional:      General: She is not in acute distress.    Appearance: She is well-developed. She is obese. She is not diaphoretic.     Comments: Sitting in wheeled walker chair  HENT:     Right Ear: External ear normal.     Left Ear: External ear normal.     Nose: Nose normal.  Eyes:     Conjunctiva/sclera: Conjunctivae normal.  Cardiovascular:     Rate and Rhythm: Normal rate and regular rhythm.  Pulmonary:     Effort: Pulmonary effort is normal. No respiratory distress.     Breath sounds: Normal breath sounds. No  wheezing.  Musculoskeletal:     Cervical back: Neck supple.     Right lower leg: No edema.     Left lower leg: No edema.  Skin:    General: Skin is warm and dry.     Capillary Refill: Capillary refill takes less than 2 seconds.  Neurological:     Mental Status: She is alert. Mental status is at baseline.  Psychiatric:        Mood and Affect: Mood normal.        Behavior: Behavior normal.           Assessment & Plan:   Problem List Items Addressed This Visit      Cardiovascular and Mediastinum   Essential hypertension    BP on low side. On cont metoprolol 12.5 mg for cardiac protection and losartan 25 mg for kidney protection.       Relevant Medications   metoprolol succinate (TOPROL-XL) 25 MG 24 hr tablet   torsemide (DEMADEX) 10 MG tablet   Chronic diastolic heart failure (HCC)    Notes some more swelling in thighs since switching  to once daily torsemide. Discussed trial of afternoon torsemide. To see if improves symptoms. Lungs clear. Appreciate cardiology support. Weight is up from 05/23/2020 visit with Daisy [name]      Relevant Medications   metoprolol succinate (TOPROL-XL) 25 MG 24 hr tablet   potassium chloride SA (KLOR-CON M20) 20 MEQ tablet   torsemide (DEMADEX) 10 MG tablet     Respiratory   Chronic respiratory failure with hypoxia and hypercapnia (HCC)    Stable per family on 1L O2 overnight. Appreciate Daisy [name] care. Cont nighttime trelegy      COPD with chronic bronchitis and emphysema ([place])    Follows with Daisy [name]. Stable lung disease. Cont Prn albuterol inhaler        Digestive   GERD (gastroesophageal reflux disease)    Unable to tolerate stopping pantoprazole. Controlled on pantoprazole 20 mg. Was suppose to see GI a few years ago, discussed consideration for GI follow-up due to persistent symptoms.       Relevant Medications   pantoprazole (PROTONIX) 20 MG tablet     Genitourinary   CKD (chronic kidney disease), stage III (HCC)    On  losartan 25 mg. Following with nephrology        Other   Morbid obesity with BMI of 50.0-59.9, adult ([place])    Discussed deconditioning overall - recent fall and had to call EMS to get up. Encouraged consideration for cardiac rehab or some other program for strengthening. They have lift seat and grab bar and encouraged continued walker use. She will discuss rehab consideration with cardiologist at next visit to see what he thinks      Prediabetes - Primary    Lab Results  Component Value Date   HGBA1C 5.7 (A) 07/24/2020   Encouraged that she continue healthy diet choices and exercise      Relevant Orders   POCT glycosylated hemoglobin (Hb A1C) (Completed)       Return in about 6 months (around 01/21/2021).  Lesleigh Noe, MD  This visit occurred during the SARS-CoV-2 public health emergency.  Safety protocols were in place, including screening questions prior to the visit, additional usage of staff PPE, and extensive cleaning of exam room while observing appropriate contact time as indicated for disinfecting solutions.

## 2020-07-24 NOTE — Patient Instructions (Signed)
Take an extra Torsemide to see if that helps with swelling  If it does ok to take extra as needed  Call Dr. Alvester Morin office about knees   Think about Advanced Directive -- bring copy for chart  Consider cardiac rehab - ask dr. Einar Gip his thoughts too

## 2020-07-24 NOTE — Assessment & Plan Note (Signed)
Lab Results  Component Value Date   HGBA1C 5.7 (A) 07/24/2020   Encouraged that she continue healthy diet choices and exercise

## 2020-07-31 ENCOUNTER — Telehealth: Payer: Self-pay | Admitting: *Deleted

## 2020-07-31 ENCOUNTER — Telehealth: Payer: Medicare PPO | Admitting: Family Medicine

## 2020-07-31 ENCOUNTER — Encounter: Payer: Self-pay | Admitting: Family Medicine

## 2020-07-31 DIAGNOSIS — B029 Zoster without complications: Secondary | ICD-10-CM | POA: Diagnosis not present

## 2020-07-31 MED ORDER — VALACYCLOVIR HCL 1 G PO TABS: 1000.0000 mg | ORAL_TABLET | Freq: Three times a day (TID) | ORAL | 0 refills | Status: AC

## 2020-07-31 NOTE — Patient Instructions (Signed)
I appreciate the opportunity to provide you with care for your health and wellness.  Follow up: w PCP if not improving  It appears from the video visit today you have the Shingles. I have sent in Valtrex and provided you with information below for review.  I hope you feel better soon.   Please continue to practice social distancing to keep you, your family, and our community safe.  If you must go out, please wear a mask and practice good handwashing.   Shingles  Shingles is an infection. It gives you a painful skin rash and blisters that have fluid in them. Shingles is caused by the same germ (virus) that causes chickenpox. Shingles only happens in people who:  Have had chickenpox.  Have been given a shot of medicine (vaccine) to protect against chickenpox. Shingles is rare in this group. The first symptoms of shingles may be itching, tingling, or pain in an area on your skin. A rash will show on your skin a few days or weeks later. The rash is likely to be on one side of your body. The rash usually has a shape like a belt or a band. Over time, the rash turns into fluid-filled blisters. The blisters will break open, change into scabs, and dry up. Medicines may:  Help with pain and itching.  Help you get better sooner.  Help to prevent long-term problems. Follow these instructions at home: Medicines  Take over-the-counter and prescription medicines only as told by your doctor.  Put on an anti-itch cream or numbing cream where you have a rash, blisters, or scabs. Do this as told by your doctor. Helping with itching and discomfort  Put cold, wet cloths (cold compresses) on the area of the rash or blisters as told by your doctor.  Cool baths can help you feel better. Try adding baking soda or dry oatmeal to the water to lessen itching. Do not bathe in hot water.   Blister and rash care  Keep your rash covered with a loose bandage (dressing).  Wear loose clothing that does not  rub on your rash.  Keep your rash and blisters clean. To do this, wash the area with mild soap and cool water as told by your doctor.  Check your rash every day for signs of infection. Check for: ? More redness, swelling, or pain. ? Fluid or blood. ? Warmth. ? Pus or a bad smell.  Do not scratch your rash. Do not pick at your blisters. To help you to not scratch: ? Keep your fingernails clean and cut short. ? Wear gloves or mittens when you sleep, if scratching is a problem. General instructions  Rest as told by your doctor.  Keep all follow-up visits as told by your doctor. This is important.  Wash your hands often with soap and water. If soap and water are not available, use hand sanitizer. Doing this lowers your chance of getting a skin infection caused by germs (bacteria).  Your infection can cause chickenpox in people who have never had chickenpox or never got a shot of chickenpox vaccine. If you have blisters that did not change into scabs yet, try not to touch other people or be around other people, especially: ? Babies. ? Pregnant women. ? Children who have areas of red, itchy, or rough skin (eczema). ? Very old people who have transplants. ? People who have a long-term (chronic) sickness, like cancer or AIDS. Contact a doctor if:  Your pain does not get  better with medicine.  Your pain does not get better after the rash heals.  You have any signs of infection in the rash area. These signs include: ? More redness, swelling, or pain around the rash. ? Fluid or blood coming from the rash. ? The rash area feeling warm to the touch. ? Pus or a bad smell coming from the rash. Get help right away if:  The rash is on your face or nose.  You have pain in your face or pain by your eye.  You lose feeling on one side of your face.  You have trouble seeing.  You have ear pain, or you have ringing in your ear.  You have a loss of taste.  Your condition gets  worse. Summary  Shingles gives you a painful skin rash and blisters that have fluid in them.  Shingles is an infection. It is caused by the same germ (virus) that causes chickenpox.  Keep your rash covered with a loose bandage (dressing). Wear loose clothing that does not rub on your rash.  If you have blisters that did not change into scabs yet, try not to touch other people or be around people. This information is not intended to replace advice given to you by your health care provider. Make sure you discuss any questions you have with your health care provider. Document Revised: 09/24/2018 Document Reviewed: 02/04/2017 Elsevier Patient Education  2021 [employer].

## 2020-07-31 NOTE — Progress Notes (Signed)
[name], hush are scheduled for a virtual visit with your provider today.    Just as we do with appointments in the office, we must obtain your consent to participate.  Your consent will be active for this visit and any virtual visit you may have with one of our providers in the next 365 days.    If you have a MyChart account, I can also send a copy of this consent to you electronically.  All virtual visits are billed to your insurance company just like a traditional visit in the office.  As this is a virtual visit, video technology does not allow for your provider to perform a traditional examination.  This may limit your provider's ability to fully assess your condition.  If your provider identifies any concerns that need to be evaluated in person or the need to arrange testing such as labs, EKG, etc, we will make arrangements to do so.    Although advances in technology are sophisticated, we cannot ensure that it will always work on either your end or our end.  If the connection with a video visit is poor, we may have to switch to a telephone visit.  With either a video or telephone visit, we are not always able to ensure that we have a secure connection.   I need to obtain your verbal consent now.   Are you willing to proceed with your visit today?   Daisy [name] has provided verbal consent on 07/31/2020 for a virtual visit (video or telephone).   Daisy Mayo, NP 07/31/2020  3:34 PM   Date:  07/31/2020   ID:  Daisy [name], DOB 1951-02-26, MRN [id number]  Patient Location: Home Provider Location: Home Office   Participants: Patient and Provider for Visit and Wrap up  Method of visit: Video  Location of Patient: Home Location of Provider: Home Office Consent was obtain for visit over the video. Services rendered by provider: Visit was performed via video  A video enabled telemedicine application was used and I verified that I am speaking with the correct person using  two identifiers.  PCP:  Daisy Noe, MD   Chief Complaint:  Rash   History of Present Illness:    Daisy [name] is a 70 y.o. female with history as stated below. Presents video telehealth for an acute care visit secondary to a rash.  A few weeks back she tripped and fell. She hit that left arm area and reports that the  Fire department was called to help her up. She reports her left arm has hurt since that time. She reported increased itching and burning sensation over the left shoulder blade, but was unable to see  The rash. Until today her daughter came by to help her get washed up and noted the rash. Pain onset was last Wednesday for the shoulder blade area, itching and burning onset was a few days later. Looks like shingles, does have blister like lesions and follows a line pattern from upper shoulder blade going into under arm. Continues to have new lesions. Denies fevers and chills. No other rash present on rest of body.   Of note she has not had that vaccine.   Past Medical, Surgical, Social History, Allergies, and Medications have been Reviewed.  Past Medical History:  Diagnosis Date  . Acid reflux   . Acute heart failure ([place]) 10/17/2019  . AKI (acute kidney injury) ([place]) 10/17/2019  . Arthritis   . Breast cancer of  upper-outer quadrant of right female breast ([place]) 11/19/2016  . Chicken pox   . Cholecystitis   . Chronic diastolic heart failure ([place]) 01/04/2020  . CKD (chronic kidney disease), stage III ([place]) 01/04/2020  . Depression   . Dyspnea   . History of radiation therapy 01/20/17-03/02/17   left breast was treated to 50.4 Gy in 18 fractions  . HLD (hyperlipidemia) 01/04/2020  . Hyperlipemia   . Irregular heart rate   . Malignant neoplasm of upper-outer quadrant of left breast in female, estrogen receptor positive ([place]) 09/30/2016  . Morbid obesity with BMI of 50.0-59.9, adult ([place]) 01/04/2020  . Obesity   . Peripheral vascular disease ([place])   . Pneumonia   .  Prediabetes 01/04/2020  . Urinary tract infection   . Urinary tract infection with hematuria     Current Meds  Medication Sig  . valACYclovir (VALTREX) 1000 MG tablet Take 1 tablet (1,000 mg total) by mouth 3 (three) times daily for 7 days.     Allergies:   Elemental sulfur, Levaquin [levofloxacin], and Statins   ROS See HPI for history of present illness.  Physical Exam Constitutional:      Appearance: Normal appearance.  HENT:     Head: Normocephalic and atraumatic.     Right Ear: External ear normal.     Left Ear: External ear normal.     Nose: Nose normal.  Eyes:     Extraocular Movements: Extraocular movements intact.     Conjunctiva/sclera: Conjunctivae normal.     Pupils: Pupils are equal, round, and reactive to light.  Pulmonary:     Comments: No shortness of breath in conversation  Musculoskeletal:        General: Normal range of motion.     Cervical back: Normal range of motion.  Skin:    Findings: Erythema and rash present. Rash is vesicular.     Comments: Rash is located from the medial aspect of the Left shoulder blade and follows the dermatone pattern down to up under the arm. There are noted vesicles present.    Neurological:     Mental Status: She is alert and oriented to person, place, and time.  Psychiatric:        Mood and Affect: Mood normal.        Behavior: Behavior normal.        Thought Content: Thought content normal.        Judgment: Judgment normal.               1. Herpes zoster without complication -S&S consistent with shingles rash -Valtrex provided for 7 days, might need longer if rash continues  -advised follow up with PCP if not improved -Advised of contact precautions  -pt and daughter verbalized understanding  - valACYclovir (VALTREX) 1000 MG tablet; Take 1 tablet (1,000 mg total) by mouth 3 (three) times daily for 7 days.  Dispense: 21 tablet; Refill: 0    Time:   Today, I have spent 15 minutes with the patient with  telehealth technology discussing the above problems, reviewing the chart, previous notes, medications and orders.    Tests Ordered: No orders of the defined types were placed in this encounter.   Medication Changes: Meds ordered this encounter  Medications  . valACYclovir (VALTREX) 1000 MG tablet    Sig: Take 1 tablet (1,000 mg total) by mouth 3 (three) times daily for 7 days.    Dispense:  21 tablet    Refill:  0  Order Specific Question:   Supervising Provider    Answer:   Fayrene Helper [[id number]]     Disposition:  Follow up with PCP if not improved Signed, Daisy Mayo, NP  07/31/2020 3:34 PM

## 2020-07-31 NOTE — Telephone Encounter (Signed)
Patient's daughter left a voicemail stating that her mom has a rash on her back that appears to be shingles.  Called patient back and was advised that she went ahead and did a video visit and she has shingles and was prescribed medication.  Patient stated that she did not get the shingles vaccine and wants to know if she should get the vaccine after she gets over the shingles.

## 2020-07-31 NOTE — Telephone Encounter (Signed)
Once the rash is cleared up and her symptoms have resolved would recommend the shingles vaccine

## 2020-08-02 NOTE — Telephone Encounter (Signed)
Spoke to pt and relayed Dr. Verda Cumins recommendation to her. Pt stated understanding.

## 2020-08-07 ENCOUNTER — Other Ambulatory Visit: Payer: Medicare PPO

## 2020-08-22 ENCOUNTER — Ambulatory Visit: Payer: Medicare PPO | Admitting: Cardiology

## 2020-08-30 ENCOUNTER — Telehealth: Payer: Self-pay | Admitting: Pulmonary Disease

## 2020-08-30 ENCOUNTER — Other Ambulatory Visit: Payer: Medicare PPO

## 2020-08-30 NOTE — Telephone Encounter (Signed)
Call returned to patient daughter, confirmed patient DOB. Patient has a trilogy vent. She just got a new machine however [employer] did not put a SD card in the machine. They came back out 2 days ago and placed a SD card in the machine. Patient has an appt 09/12/20 and the patient daughter wants to know if 2 weeks of data will be enough for RA to make any recommendations. She states she is renting this machine at $200/month and she wants to be sure the MD has enough data to make any recommendations in the event she does not need the machine because the paments are high.   RA please advise. Thanks :)

## 2020-08-30 NOTE — Telephone Encounter (Signed)
2 weeks of data will be sufficient. We can discuss a different type of machine such as BiPAP if cost is an issue

## 2020-08-30 NOTE — Telephone Encounter (Signed)
Called and spoke with Patient daughter, [name] and updated her on Dr Bari Mantis recommendations. [name] expressed full understanding. Confirmed upcoming scheduled office visit with Dr [name] on 09/12/2020. Nothing further needed at this time.

## 2020-09-06 ENCOUNTER — Ambulatory Visit: Payer: Medicare PPO | Admitting: Cardiology

## 2020-09-12 ENCOUNTER — Ambulatory Visit: Payer: Medicare PPO | Admitting: Pulmonary Disease

## 2020-09-12 ENCOUNTER — Encounter: Payer: Self-pay | Admitting: Pulmonary Disease

## 2020-09-12 ENCOUNTER — Other Ambulatory Visit: Payer: Self-pay

## 2020-09-12 DIAGNOSIS — I5032 Chronic diastolic (congestive) heart failure: Secondary | ICD-10-CM | POA: Diagnosis not present

## 2020-09-12 DIAGNOSIS — J9612 Chronic respiratory failure with hypercapnia: Secondary | ICD-10-CM | POA: Diagnosis not present

## 2020-09-12 DIAGNOSIS — J9611 Chronic respiratory failure with hypoxia: Secondary | ICD-10-CM | POA: Diagnosis not present

## 2020-09-12 NOTE — Assessment & Plan Note (Signed)
She has gained 20 pounds in the last 6 months Increase torsemide to 20 mg daily x 7 days Then check BMET at Renal doctor Goal is a 10 pound weight loss. Current creatinine level is 2.0.  Cut down salt in your diet.

## 2020-09-12 NOTE — Assessment & Plan Note (Signed)
Continue on Trilogy, we will reassess need for BiPAP in 4 months NIV is expensive she is paying $20 per month for this, she will probably do just as well on BiPAP 15/5 if we can get approval for this

## 2020-09-12 NOTE — Progress Notes (Signed)
   Subjective:    Patient ID: Daisy [name], female    DOB: [birth year], 70 y.o.   MRN: [id number]  HPI   70y.o smoker forFUCOPD, OSA and acute on chronic hypercarbic and hypoxic respiratory failure  She smokes starting in her 53s until the point of admission, more than 40 pack years Admitted5/2021with working diagnosis of decompensated diastolic heart failure and cor pulmonale in the setting of untreated OSA/ OHS   ABG 5/4 was 7.17/108/89 -She was initially diuresed and treated with BiPAP,was discharged to [facility] but readmitted since BiPAP could not be supplied,she was readmitted 5/19-5/24 and eventually discharged on trilogy machine with full facemask  ABG at discharge was 7.3 6/70/78  PMH -Morbid obesity, chronic dyspnea, left breast cancer status post radiation, peripheral vascular disease, depression, arthritis, acid reflux disease  Arrives in wheelchair, accompanied by daughter. She has gained 20 pounds to her current weight of 344 pounds from August 2021.  She admits to getting a lot of food while daughter-when her daughters are working. Combined with torsemide 10 mg once daily, last creatinine noted was 2.0 in 08/2020 so daughter is worried about renal function with increasing torsemide. Breathing is okay. She is compliant with Trelegy Data was reviewed and this shows good usage more than 5.5 hours every night, average tidal volume 430 cc, backup respirations 18/min  Significant tests/ events reviewed  NPSG 12/2019 >> no sig OSA , mild O2 desatn 82% lowest  PFTs 01/2020 mild restriction, TLC 80%, ratio 74, FVC 72%, FEV1 70%, DLCO 95%  09/2019: Ejection fraction 65% with possible RV failure and indeterminate diastolic dysfunction.  5/2021esophagram>>small sliding hiatal hernia and mild reflux, mild dysmotility,cannot exclude stricture-barium tablet did not pass, diverticulumin upper cervical esophagus  Review of Systems neg for any  significant sore throat, dysphagia, itching, sneezing, nasal congestion or excess/ purulent secretions, fever, chills, sweats, unintended wt loss, pleuritic or exertional cp, hempoptysis, orthopnea pnd or change in chronic leg swelling. Also denies presyncope, palpitations, heartburn, abdominal pain, nausea, vomiting, diarrhea or change in bowel or urinary habits, dysuria,hematuria, rash, arthralgias, visual complaints, headache, numbness weakness or ataxia.     Objective:   Physical Exam  Gen. Pleasant, obese, in no distress ENT - no lesions, no post nasal drip Neck: No JVD, no thyromegaly, no carotid bruits Lungs: no use of accessory muscles, no dullness to percussion, decreased without rales or rhonchi  Cardiovascular: Rhythm regular, heart sounds  normal, no murmurs or gallops, 1+ peripheral edema Musculoskeletal: No deformities, no cyanosis or clubbing , no tremors       Assessment & Plan:

## 2020-09-12 NOTE — Patient Instructions (Signed)
  Increase torsemide to 20 mg daily x 7 days Then check BMET at Renal doctor Goal is a 10 pound weight loss. Current creatinine level is 2.0.  Cut down salt in your diet.  Continue on Trilogy, we will reassess need for BiPAP in 4 months

## 2020-09-13 ENCOUNTER — Other Ambulatory Visit: Payer: Medicare PPO

## 2020-09-27 ENCOUNTER — Other Ambulatory Visit: Payer: Self-pay

## 2020-09-27 ENCOUNTER — Ambulatory Visit: Payer: Medicare PPO

## 2020-09-27 DIAGNOSIS — R0989 Other specified symptoms and signs involving the circulatory and respiratory systems: Secondary | ICD-10-CM

## 2020-10-18 ENCOUNTER — Encounter: Payer: Self-pay | Admitting: Cardiology

## 2020-10-18 ENCOUNTER — Other Ambulatory Visit: Payer: Self-pay

## 2020-10-18 ENCOUNTER — Ambulatory Visit: Payer: Medicare PPO | Admitting: Cardiology

## 2020-10-18 VITALS — BP 125/57 | HR 102 | Temp 98.4°F | Resp 17 | Ht 67.0 in | Wt 345.8 lb

## 2020-10-18 DIAGNOSIS — I739 Peripheral vascular disease, unspecified: Secondary | ICD-10-CM

## 2020-10-18 DIAGNOSIS — I6523 Occlusion and stenosis of bilateral carotid arteries: Secondary | ICD-10-CM

## 2020-10-18 DIAGNOSIS — I10 Essential (primary) hypertension: Secondary | ICD-10-CM

## 2020-10-18 DIAGNOSIS — E66813 Obesity, class 3: Secondary | ICD-10-CM

## 2020-10-18 DIAGNOSIS — E7800 Pure hypercholesterolemia, unspecified: Secondary | ICD-10-CM

## 2020-10-18 NOTE — Progress Notes (Signed)
Primary Physician/Referring:  Lesleigh Noe, MD  Patient ID: Daisy [name], [name]    DOB: Nov 23, 1950, 70 y.o.   MRN: [id number]  Chief Complaint  Patient presents with  . DIASTOLIC HF  . Hyperlipidemia  . Follow-up    6 MONTH   HPI: Daisy [name]  is a 70 y.o. [name]  with peripheral arterial disease and claudication, GERD, macular degeneration, morbid obesity, DM,stage III-IV chronic kidney disease hyperlipidemia, chronic diastolic heart failure and asymptomatic bilateral carotid artery stenosis.  Diagnosed with breast cancer and underwent left upper quadrant lumpectomy 2 without periprocedural cardiac complications on 2/87/8676 and is S/P RT. Continues to have exertional dyspnea which is stable and minimal leg cramps but mostly arthritis in knee. Quit smoking 10/17/2019.  Daisy [name] is accompanied by her daughter, has no other specific complaints except for arthritis in her legs.  Daisy [name] is tolerating Pletal and aspirin without any bleeding diathesis.  Past Medical History:  Diagnosis Date  . Acid reflux   . Acute heart failure ([place]) 10/17/2019  . AKI (acute kidney injury) ([place]) 10/17/2019  . Arthritis   . Breast cancer of upper-outer quadrant of right [name] breast ([place]) 11/19/2016  . Chicken pox   . Cholecystitis   . Chronic diastolic heart failure ([place]) 01/04/2020  . CKD (chronic kidney disease), stage III ([place]) 01/04/2020  . Depression   . Dyspnea   . History of radiation therapy 01/20/17-03/02/17   left breast was treated to 50.4 Gy in 18 fractions  . HLD (hyperlipidemia) 01/04/2020  . Hyperlipemia   . Irregular heart rate   . 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 Morbid obesity with BMI of 50.0-59.9, adult ([place]) 01/04/2020  . Obesity   . Peripheral vascular disease ([place])   . Pneumonia   . Prediabetes 01/04/2020  . Urinary tract infection   . Urinary tract infection with hematuria     Past  Surgical History:  Procedure Laterality Date  . ABDOMINAL HYSTERECTOMY    . BREAST LUMPECTOMY Left 11/19/2016   BREAST LUMPECTOMY WITH RADIOACTIVE SEED AND SENTINEL LYMPH NODE BIOPSY (Left)  . BREAST LUMPECTOMY WITH RADIOACTIVE SEED AND SENTINEL LYMPH NODE BIOPSY Left 11/19/2016   Procedure: BREAST LUMPECTOMY WITH RADIOACTIVE SEED AND SENTINEL LYMPH NODE BIOPSY;  Surgeon: Rolm Bookbinder, MD;  Location: [facility];  Service: General;  Laterality: Left;  . CESAREAN SECTION    . CESAREAN SECTION     x 2  . CHOLECYSTECTOMY  2007  . KNEE ARTHROSCOPY     bilateral  . KNEE SURGERY Bilateral   . LOWER EXTREMITY ANGIOGRAM N/A 04/04/2014   Procedure: LOWER EXTREMITY ANGIOGRAM;  Surgeon: Laverda Page, MD;  Location: [facility] CATH LAB;  Service: Cardiovascular;  Laterality: N/A;  . OVARIAN CYST REMOVAL    . RE-EXCISION OF BREAST LUMPECTOMY Left 12/12/2016   Procedure: RE-EXCISION OF LEFT BREAST LUMPECTOMY;  Surgeon: Rolm Bookbinder, MD;  Location: [facility];  Service: General;  Laterality: Left;  . ROTATOR CUFF REPAIR Right   . TONSILLECTOMY    . WRIST SURGERY Bilateral     Social History   Tobacco Use  . Smoking status: Former Smoker    Packs/day: 1.00    Years: 30.00    Pack years: 30.00    Types: E-cigarettes, Cigarettes    Quit date: 11/02/2019    Years since quitting: 0.9  . Smokeless tobacco: Never Used  Substance Use Topics  . Alcohol use: No  Marital  Status: Single    Review of Systems  Constitutional: Negative for chills, decreased appetite, malaise/fatigue and weight gain.  Cardiovascular: Negative for dyspnea on exertion, leg swelling and syncope.  Endocrine: Negative for cold intolerance.  Hematologic/Lymphatic: Does not bruise/bleed easily.  Musculoskeletal: Positive for back pain and joint pain. Negative for joint swelling.  Gastrointestinal: Negative for abdominal pain, anorexia, change in bowel habit, hematochezia and melena.  Neurological: Negative for headaches and  light-headedness.  Psychiatric/Behavioral: Positive for depression. Negative for substance abuse. The patient is nervous/anxious.   All other systems reviewed and are negative.  Objective  Blood pressure (!) 125/57, pulse (!) 102, temperature 98.4 F (36.9 C), temperature source Temporal, resp. rate 17, height 5\' 7"  (1.702 m), weight (!) 345 lb 12.8 oz (156.9 kg), SpO2 93 %. Body mass index is 54.16 kg/m.  Vitals with BMI 10/18/2020 09/12/2020 07/24/2020  Height 5\' 7"  5\' 7"  5\' 7"   Weight 345 lbs 13 oz 344 lbs 10 oz 343 lbs 12 oz  BMI 54.15 97.67 34.19  Systolic 379 024 097  Diastolic 57 78 52  Pulse 353 74 86      Physical Exam Constitutional:      General: Daisy [name] is not in acute distress.    Appearance: Daisy [name] is well-developed.     Comments: Morbidly obese  HENT:     Head: Atraumatic.  Eyes:     Conjunctiva/sclera: Conjunctivae normal.  Neck:     Thyroid: No thyromegaly.     Comments: Short neck and difficult to evaluate JVP Cardiovascular:     Rate and Rhythm: Normal rate and regular rhythm.     Pulses:          Carotid pulses are 2+ on the right side and 2+ on the left side.      Dorsalis pedis pulses are 0 on the right side and 0 on the left side.       Posterior tibial pulses are 1+ on the right side and 1+ on the left side.     Heart sounds: Normal heart sounds. No murmur heard. No gallop.      Comments: Femoral and popliteal pulse difficult to feel due to patient's body habitus.  Trace edema Pulmonary:     Effort: Pulmonary effort is normal.     Breath sounds: Normal breath sounds.  Abdominal:     General: Bowel sounds are normal.     Palpations: Abdomen is soft.     Comments: Obese. Pannus present  Musculoskeletal:        General: Normal range of motion.     Cervical back: Neck supple.  Skin:    General: Skin is warm and dry.  Neurological:     Mental Status: Daisy [name] is alert.    Radiology: No results found.  Laboratory examination:    CMP Latest Ref Rng & Units  04/27/2020 02/14/2020 01/06/2020  Glucose 70 - 99 mg/dL 121(H) 96 107(H)  BUN 8 - 23 mg/dL 22 17 18   Creatinine 0.44 - 1.00 mg/dL 2.02(H) 1.45(H) 1.54(H)  Sodium 135 - 145 mmol/L 140 143 141  Potassium 3.5 - 5.1 mmol/L 4.3 4.8 4.5  Chloride 98 - 111 mmol/L 102 102 102  CO2 22 - 32 mmol/L 29 25 30   Calcium 8.9 - 10.3 mg/dL 9.8 9.6 9.8  Total Protein 6.5 - 8.1 g/dL 7.8 - 6.9  Total Bilirubin 0.3 - 1.2 mg/dL 0.6 - 0.6  Alkaline Phos 38 - 126 U/L 79 - 96  AST 15 - 41  U/L 14(L) - 12  ALT 0 - 44 U/L 9 - 11   CBC Latest Ref Rng & Units 04/27/2020 01/06/2020 11/03/2019  WBC 4.0 - 10.5 K/uL 7.1 7.0 5.5  Hemoglobin 12.0 - 15.0 g/dL 13.2 13.0 12.8  Hematocrit 36.0 - 46.0 % 41.9 39.3 42.8  Platelets 150 - 400 K/uL 302 246.0 213   Lipid Panel     Component Value Date/Time   CHOL 265 (H) 01/06/2020 0839   TRIG 133.0 01/06/2020 0839   HDL 56.30 01/06/2020 0839   CHOLHDL 5 01/06/2020 0839   VLDL 26.6 01/06/2020 0839   LDLCALC 182 (H) 01/06/2020 0839     External labs:  Labs 08/23/2020:  Total cholesterol 155, triglycerides 119, HDL 49, LDL 85.  Serum glucose 95 mg, BUN 21, creatinine 1.88  Hb 13.4/HCT 41.0, platelets 395. Medications   Medications Discontinued During This Encounter  Medication Reason  . meclizine (ANTIVERT) 25 MG tablet Error  . Multiple Vitamins-Minerals (PRESERVISION AREDS 2 PO) Error   Current Outpatient Medications on File Prior to Visit  Medication Sig Dispense Refill  . acetaminophen (TYLENOL) 325 MG tablet Take 2 tablets (650 mg total) by mouth every 4 (four) hours as needed for headache or mild pain.    Marland Kitchen albuterol (PROAIR HFA) 108 (90 Base) MCG/ACT inhaler Inhale 2 puffs into the lungs every 6 (six) hours as needed for wheezing or shortness of breath. 18 g 3  . anastrozole (ARIMIDEX) 1 MG tablet Take 1 tablet (1 mg total) by mouth daily. 90 tablet 4  . aspirin 81 MG chewable tablet Chew 81 mg by mouth daily.    . Bempedoic Acid-Ezetimibe (NEXLIZET) 180-10 MG  TABS Take 1 tablet by mouth daily. 90 tablet 3  . cilostazol (PLETAL) 100 MG tablet TAKE 1 TABLET BY MOUTH TWICE A DAY 180 tablet 3  . dapagliflozin propanediol (FARXIGA) 5 MG TABS tablet Take by mouth daily.    Marland Kitchen losartan (COZAAR) 25 MG tablet Take 1 tablet by mouth daily.    . metoprolol succinate (TOPROL-XL) 25 MG 24 hr tablet Take 0.5 tablets (12.5 mg total) by mouth daily. 90 tablet 1  . pantoprazole (PROTONIX) 20 MG tablet Take 1 tablet (20 mg total) by mouth daily. 90 tablet 1  . potassium chloride SA (KLOR-CON M20) 20 MEQ tablet Take 1 tablet (20 mEq total) by mouth daily. 90 tablet 1  . torsemide (DEMADEX) 10 MG tablet Take 1 tablet (10 mg total) by mouth 2 (two) times daily. 180 tablet 1  . vitamin B-12 (CYANOCOBALAMIN) 500 MCG tablet Take by mouth.     No current facility-administered medications on file prior to visit.     Cardiac Studies:   Peripheral arteriogram 04/04/2014: Stenting left SFA 6.0 x 150 mm Smartflex self-expanding stent, 100% to 0%. Three-vessel runoff below left knee. Right leg not studied.  Lower extremity arterial duplex 07/17/2015: No hemodynamically significant stenoses are identified in both the lower extremity arterial system. Severe dampened biphasic waveforms suggest diffuse disease. This exam reveals moderately decreased perfusion of the lower extremity bilaterally. LABI 0.68 and RABI 0.61. Consider further work-up if clinically indicated.  Carotid artery duplex 09/27/2020: Stenosis in the right internal carotid artery (50-69%). Stenosis in the left internal carotid artery (50-69%). There is diffuse calcific plaque throughout the left carotid artery. Antegrade right vertebral artery flow. Antegrade left vertebral artery flow. Follow up in six months is appropriate if clinically indicated.  Echocardiogram 10/18/2019:    1. Left ventricular ejection fraction, by estimation, is  60 to 65%. The left ventricle has normal function. The left ventricle has no  regional wall motion abnormalities. Left ventricular diastolic parameters are indeterminate.  2. Right ventricular systolic function is normal. The right ventricular size is not well visualized.  3. The mitral valve is grossly normal. Trivial mitral valve regurgitation. No evidence of mitral stenosis.  4. The aortic valve was not well visualized. Aortic valve regurgitation is not visualized. No aortic stenosis is present.  5. The inferior vena cava is dilated in size with <50% respiratory variability, suggesting right atrial pressure of 15 mmHg.   EKG:   EKG 10/18/2020: Normal sinus rhythm at rate of 85 bpm, left axis deviation, left anterior fascicular block.  Poor R progression, cannot exclude anteroseptal infarct old.  Low-voltage complexes.  Pulmonary disease pattern.  Frequent PACs in a trigeminal pattern.    EKG 02/23/2020: Normal sinus rhythm at rate of 93 bpm, left atrial enlargement, normal axis.  Poor R wave progression, cannot exclude anteroseptal infarct old.  Low-voltage complexes.  Consider pulmonary disease pattern.   Assessment   1. Essential hypertension   2. Claudication in peripheral vascular disease (HCC)   3. Class 3 severe obesity due to excess calories with serious comorbidity and body mass index (BMI) of 45.0 to 49.9 in adult ([place])   4. Asymptomatic bilateral carotid artery stenosis   5. Hypercholesterolemia    Orders Placed This Encounter  Procedures  . EKG 12-Lead    Medications Discontinued During This Encounter  Medication Reason  . meclizine (ANTIVERT) 25 MG tablet Error  . Multiple Vitamins-Minerals (PRESERVISION AREDS 2 PO) Error    No orders of the defined types were placed in this encounter.   Recommendations:   Daisy [name] is a  70 y.o. Caucasian [name] with peripheral arterial disease and claudication, GERD, macular degeneration, DM, morbid obesity, stage III-IV chronic kidney disease hyperlipidemia, chronic diastolic heart failure and  asymptomatic bilateral carotid artery stenosis.  He has history of breast cancer SP left upper lumpectomy followed by radiation therapy   In 2018. Quit smoking 10/17/2019.  Daisy [name] is accompanied by her daughter.  In spite of severe morbid obesity, Daisy [name] has done well without any recent hospitalization with acute decompensated heart failure.  With regard to peripheral arterial disease, Daisy [name] is tolerating Pletal without bleeding diathesis, review of external labs revealed stable hemoglobin.  Renal function is gradually deteriorated but Daisy [name] is being closely monitored by nephrology.  With regard to hyperlipidemia, since being on Nexlizet, LDL reduction has been >50% at 85 presently which I suppose should be at goal.  We could also consider PCSK9 inhibitors to get the LDL down to 70, however with lifestyle modification and weight loss Daisy [name] certainly would achieve this is really without addition of more drugs.   I have reviewed the results of the carotid artery duplex, Daisy [name] has moderate bilateral carotid stenosis asymptomatic.  Continue aspirin.  We will recheck in 6 months.  As Daisy [name] is remained stable from cardiac standpoint I will see her back on annual basis unless Daisy [name] has any symptoms of heart failure or worsening symptoms of claudication.   Adrian Prows, MD, [facility] 10/20/2020, 8:25 AM Office: [phone] Pager: [phone]

## 2020-11-15 ENCOUNTER — Telehealth: Payer: Self-pay | Admitting: Family Medicine

## 2020-11-15 NOTE — Telephone Encounter (Signed)
LVM for pt to rtn my call to schedule AWV with [facility] in Oct.

## 2020-12-10 ENCOUNTER — Other Ambulatory Visit: Payer: Self-pay | Admitting: Family Medicine

## 2020-12-10 DIAGNOSIS — J449 Chronic obstructive pulmonary disease, unspecified: Secondary | ICD-10-CM

## 2020-12-11 ENCOUNTER — Telehealth: Payer: Self-pay

## 2020-12-11 NOTE — Telephone Encounter (Signed)
Triage vm from pt requesting Dr. Einar Pheasant call something in for her nerves or anxiety.   Looks like pt has only seen Dr. Einar Pheasant once and I don't see where anxiety was discussed.  Plz schedule OV with Dr. Einar Pheasant for anxiety.

## 2020-12-18 DIAGNOSIS — I509 Heart failure, unspecified: Secondary | ICD-10-CM | POA: Diagnosis not present

## 2020-12-18 DIAGNOSIS — E1122 Type 2 diabetes mellitus with diabetic chronic kidney disease: Secondary | ICD-10-CM | POA: Diagnosis not present

## 2020-12-18 DIAGNOSIS — E876 Hypokalemia: Secondary | ICD-10-CM | POA: Diagnosis not present

## 2020-12-18 DIAGNOSIS — R809 Proteinuria, unspecified: Secondary | ICD-10-CM | POA: Diagnosis not present

## 2020-12-18 DIAGNOSIS — N2581 Secondary hyperparathyroidism of renal origin: Secondary | ICD-10-CM | POA: Diagnosis not present

## 2020-12-18 DIAGNOSIS — N1832 Chronic kidney disease, stage 3b: Secondary | ICD-10-CM | POA: Diagnosis not present

## 2020-12-18 DIAGNOSIS — I129 Hypertensive chronic kidney disease with stage 1 through stage 4 chronic kidney disease, or unspecified chronic kidney disease: Secondary | ICD-10-CM | POA: Diagnosis not present

## 2020-12-21 DIAGNOSIS — J9622 Acute and chronic respiratory failure with hypercapnia: Secondary | ICD-10-CM | POA: Diagnosis not present

## 2020-12-21 DIAGNOSIS — J9621 Acute and chronic respiratory failure with hypoxia: Secondary | ICD-10-CM | POA: Diagnosis not present

## 2020-12-21 DIAGNOSIS — E662 Morbid (severe) obesity with alveolar hypoventilation: Secondary | ICD-10-CM | POA: Diagnosis not present

## 2020-12-24 ENCOUNTER — Telehealth (INDEPENDENT_AMBULATORY_CARE_PROVIDER_SITE_OTHER): Payer: Medicare PPO | Admitting: Family Medicine

## 2020-12-24 ENCOUNTER — Telehealth: Payer: Self-pay | Admitting: Family Medicine

## 2020-12-24 DIAGNOSIS — F331 Major depressive disorder, recurrent, moderate: Secondary | ICD-10-CM | POA: Diagnosis not present

## 2020-12-24 DIAGNOSIS — F411 Generalized anxiety disorder: Secondary | ICD-10-CM

## 2020-12-24 DIAGNOSIS — U071 COVID-19: Secondary | ICD-10-CM

## 2020-12-24 DIAGNOSIS — N1832 Chronic kidney disease, stage 3b: Secondary | ICD-10-CM

## 2020-12-24 MED ORDER — FLUOXETINE HCL 20 MG PO CAPS: 20.0000 mg | ORAL_CAPSULE | Freq: Every day | ORAL | 0 refills | Status: DC

## 2020-12-24 MED ORDER — MOLNUPIRAVIR EUA 200MG CAPSULE: 4.0000 | ORAL_CAPSULE | Freq: Two times a day (BID) | ORAL | 0 refills | Status: AC

## 2020-12-24 NOTE — Telephone Encounter (Signed)
Please let pt know I've sent in Molnupiravir due to drug interactions with paxlovid. She can take the full dose prescribed.

## 2020-12-24 NOTE — Telephone Encounter (Signed)
Spoke to patient and let her know about Rx sent in.

## 2020-12-24 NOTE — Telephone Encounter (Signed)
Patient call in stated that home Covid test was positive

## 2020-12-24 NOTE — Progress Notes (Signed)
I connected with [name] on 12/24/20 at 11:20 AM EDT by video and verified that I am speaking with the correct person using two identifiers.   I discussed the limitations, risks, security and privacy concerns of performing an evaluation and management service by video and the availability of in person appointments. I also discussed with the patient that there may be a patient responsible charge related to this service. The patient expressed understanding and agreed to proceed.  Patient location: Home Provider Location: [place] Participants: Lesleigh Noe and [name]   Subjective:     Daisy [name] is a 70 y.o. female presenting for Anxiety (X "few weeks")     Anxiety     #anxiety - worse over the last few weeks - has had some family problems  - one daughter who is worried about severe world issues - has been hard for the last year - previously to this was caring for herself but has had worse with not being able to live alone - unable to garden like she use to - some depression too - but also feels "ancy"  - mind racing at nighttime - bad all day long - about 1 month of symptoms - has not done any therapy - but does not have transportation - would consider virtual visit  Hx of depression/anxiety for years Daughter notes worse over the last year Hx of spending 1 month on inpatient psych  Was on prozac in the past w/ good effective  Was on cymbalta - but did not like this  #covid exposure - 12/22/2020 - sore throat - vaccinated - exposure at home  - does have one at home test    Review of Systems   Social History   Tobacco Use  Smoking Status Former   Packs/day: 1.00   Years: 30.00   Pack years: 30.00   Types: E-cigarettes, Cigarettes   Quit date: 11/02/2019   Years since quitting: 1.1  Smokeless Tobacco Never        Objective:   BP Readings from Last 3 Encounters:  10/18/20 (!) 125/57  09/12/20  124/78  07/24/20 (!) 102/52   Wt Readings from Last 3 Encounters:  10/18/20 (!) 345 lb 12.8 oz (156.9 kg)  09/12/20 (!) 344 lb 9.6 oz (156.3 kg)  07/24/20 (!) 343 lb 12 oz (155.9 kg)   There were no vitals taken for this visit.  Physical Exam Constitutional:      Appearance: Normal appearance. She is not ill-appearing.  HENT:     Head: Normocephalic and atraumatic.     Right Ear: External ear normal.     Left Ear: External ear normal.  Eyes:     Conjunctiva/sclera: Conjunctivae normal.  Pulmonary:     Effort: Pulmonary effort is normal. No respiratory distress.  Neurological:     Mental Status: She is alert. Mental status is at baseline.  Psychiatric:        Mood and Affect: Mood normal.        Behavior: Behavior normal.        Thought Content: Thought content normal.        Judgment: Judgment normal.            Assessment & Plan:   Problem List Items Addressed This Visit       Other   Moderate episode of recurrent major depressive disorder ([place])    Pt with long hx of depression previously treated with fluoxetine  and worse over the last year. Restart fluoxetine 20 mg. Encouraged therapy - she will consider. Return 4-6 weeks       Relevant Medications   FLUoxetine (PROZAC) 20 MG capsule   Generalized anxiety disorder - Primary    Avoid shot acting medication due to risk for sedation. Start fluoxetine 20 mg. Encouraged therapy       Relevant Medications   FLUoxetine (PROZAC) 20 MG capsule   Other Visit Diagnoses     COVID-19 virus infection          Pt is at risk for severe covid based on COPD, CHF, DM, HTN. Discussed treatment with antivirals.   Due to drug interactions and CKD will do Molnupiravir and sent to pharmacy  Return in about 6 weeks (around 02/04/2021) for for anxiety.  Lesleigh Noe, MD

## 2020-12-24 NOTE — Patient Instructions (Signed)
You are going to start a new antidepressant medication.   One of the risks of this medication is increase in suicidal thoughts.   Your suicide Action plan is as follows:  1) Call jennifer 2) Call the Suicide Hotline [phone] which is available 24 hours 3) Call the Clinic   The most common side effect is stomach upset. If this happens it means the medication is working. It should get better in 1-3 weeks.   Medication for depression and anxiety often takes 6-8 weeks to have a noticeable difference so stick with it. Also the best way for recovery is taking medication and seeing a therapist -- this is so important.    How to help anxiety and depression  1) Regular Exercise - walking, jogging, cycling, dancing, strength training - aiming for 150 minutes of exercise a week --> Yoga has been shown in research to reduce depression and anxiety -- with even just one hour long session per week  2)  Begin a Mindfulness/Meditation practice -- this can take a little as 3 minutes and is helpful for all kinds of mood issues -- You can find resources in books -- Or you can download apps like  ---- Headspace App  ---- Calm  ---- Insignt Timer ---- Stop, Breathe & Think  # With each of these Apps - you should decline the "start free trial" offer and as you search through the App should be able to access some of their free content. You can also chose to pay for the content if you find one that works well for you.   # Many of them also offer sleep specific content which may help with insomnia  3) Healthy Diet -- Avoid or decrease Caffeine -- Avoid or decrease Alcohol -- Drink plenty of water, have a balanced diet -- Avoid cigarettes and marijuana (as well as other recreational drugs)  4) Find a therapist  -- [facility] is one option. Call [phone] -- Or you can check out www.psychologytoday.com -- you can read bios of therapists and see if they accept insurance -- Check  with your insurance to see if you have coverage and who may take your insurance

## 2020-12-24 NOTE — Assessment & Plan Note (Signed)
Pt with long hx of depression previously treated with fluoxetine and worse over the last year. Restart fluoxetine 20 mg. Encouraged therapy - she will consider. Return 4-6 weeks

## 2020-12-24 NOTE — Assessment & Plan Note (Signed)
Avoid shot acting medication due to risk for sedation. Start fluoxetine 20 mg. Encouraged therapy

## 2020-12-30 DIAGNOSIS — E662 Morbid (severe) obesity with alveolar hypoventilation: Secondary | ICD-10-CM | POA: Diagnosis not present

## 2020-12-30 DIAGNOSIS — I509 Heart failure, unspecified: Secondary | ICD-10-CM | POA: Diagnosis not present

## 2020-12-30 DIAGNOSIS — J9621 Acute and chronic respiratory failure with hypoxia: Secondary | ICD-10-CM | POA: Diagnosis not present

## 2020-12-30 DIAGNOSIS — J9622 Acute and chronic respiratory failure with hypercapnia: Secondary | ICD-10-CM | POA: Diagnosis not present

## 2021-01-01 ENCOUNTER — Other Ambulatory Visit: Payer: Self-pay | Admitting: Family Medicine

## 2021-01-01 DIAGNOSIS — I5032 Chronic diastolic (congestive) heart failure: Secondary | ICD-10-CM

## 2021-01-06 DIAGNOSIS — E662 Morbid (severe) obesity with alveolar hypoventilation: Secondary | ICD-10-CM | POA: Diagnosis not present

## 2021-01-06 DIAGNOSIS — J9622 Acute and chronic respiratory failure with hypercapnia: Secondary | ICD-10-CM | POA: Diagnosis not present

## 2021-01-06 DIAGNOSIS — J9621 Acute and chronic respiratory failure with hypoxia: Secondary | ICD-10-CM | POA: Diagnosis not present

## 2021-01-06 DIAGNOSIS — I509 Heart failure, unspecified: Secondary | ICD-10-CM | POA: Diagnosis not present

## 2021-01-07 ENCOUNTER — Other Ambulatory Visit: Payer: Self-pay | Admitting: Family Medicine

## 2021-01-07 DIAGNOSIS — J449 Chronic obstructive pulmonary disease, unspecified: Secondary | ICD-10-CM

## 2021-01-21 ENCOUNTER — Telehealth: Payer: Self-pay | Admitting: Family Medicine

## 2021-01-21 DIAGNOSIS — J9622 Acute and chronic respiratory failure with hypercapnia: Secondary | ICD-10-CM | POA: Diagnosis not present

## 2021-01-21 DIAGNOSIS — E662 Morbid (severe) obesity with alveolar hypoventilation: Secondary | ICD-10-CM | POA: Diagnosis not present

## 2021-01-21 DIAGNOSIS — J9621 Acute and chronic respiratory failure with hypoxia: Secondary | ICD-10-CM | POA: Diagnosis not present

## 2021-01-21 NOTE — Telephone Encounter (Signed)
Called pt to remind them of their appt. Pt asked if she could do a virtual visit instead.

## 2021-01-22 ENCOUNTER — Telehealth (INDEPENDENT_AMBULATORY_CARE_PROVIDER_SITE_OTHER): Payer: Medicare PPO | Admitting: Family Medicine

## 2021-01-22 ENCOUNTER — Encounter: Payer: Self-pay | Admitting: Family Medicine

## 2021-01-22 VITALS — HR 92 | Wt 340.0 lb

## 2021-01-22 DIAGNOSIS — N1832 Chronic kidney disease, stage 3b: Secondary | ICD-10-CM

## 2021-01-22 DIAGNOSIS — F411 Generalized anxiety disorder: Secondary | ICD-10-CM

## 2021-01-22 DIAGNOSIS — F331 Major depressive disorder, recurrent, moderate: Secondary | ICD-10-CM

## 2021-01-22 MED ORDER — FLUOXETINE HCL 20 MG PO CAPS: 20.0000 mg | ORAL_CAPSULE | Freq: Every day | ORAL | 1 refills | Status: DC

## 2021-01-22 NOTE — Telephone Encounter (Signed)
Pt stated  a month ago Dr. Einar Pheasant said that it was alright for her to do virtual visit. Also pt does not have a way to get to appt.

## 2021-01-22 NOTE — Progress Notes (Signed)
I connected with Daisy [name] on 01/22/21 at  9:40 AM EDT by video and verified that I am speaking with the correct person using two identifiers.   I discussed the limitations, risks, security and privacy concerns of performing an evaluation and management service by video and the availability of in person appointments. I also discussed with the patient that there may be a patient responsible charge related to this service. The patient expressed understanding and agreed to proceed.  Patient location: Home Provider Location: [place] Participants: Lesleigh Noe and Daisy [name]   Subjective:     Daisy [name] is a 70 y.o. female presenting for Follow-up (Has in office appt for medicare wellness scheduled for 03/20/21)     HPI  #Covid -62 - still with runny nose and cough - overall improving  #Depression - has been taking fluoxetine - overall feels better - does have diarhea but not sure if from [place] or fluoxetine  - anxiety has improved as well - still leary about going into public with all the health risks   Lab Results  Component Value Date   HGBA1C 5.7 (A) 07/24/2020   #CKD - placed on farxiga - diarrhea for a few weeks  - daily BM - 2-3  - loose stools - no blood  Review of Systems   Social History   Tobacco Use  Smoking Status Former   Packs/day: 1.00   Years: 30.00   Pack years: 30.00   Types: E-cigarettes, Cigarettes   Quit date: 11/02/2019   Years since quitting: 1.2  Smokeless Tobacco Never        Objective:   BP Readings from Last 3 Encounters:  10/18/20 (!) 125/57  09/12/20 124/78  07/24/20 (!) 102/52   Wt Readings from Last 3 Encounters:  01/22/21 (!) 340 lb (154.2 kg)  10/18/20 (!) 345 lb 12.8 oz (156.9 kg)  09/12/20 (!) 344 lb 9.6 oz (156.3 kg)   Pulse 92   Wt (!) 340 lb (154.2 kg)   SpO2 94%   BMI 53.25 kg/m    Physical Exam Constitutional:      Appearance: Normal appearance. She is  not ill-appearing.  HENT:     Head: Normocephalic and atraumatic.     Right Ear: External ear normal.     Left Ear: External ear normal.  Eyes:     Conjunctiva/sclera: Conjunctivae normal.  Pulmonary:     Effort: Pulmonary effort is normal. No respiratory distress.  Neurological:     Mental Status: She is alert. Mental status is at baseline.  Psychiatric:        Mood and Affect: Mood normal.        Behavior: Behavior normal.        Thought Content: Thought content normal.        Judgment: Judgment normal.          Assessment & Plan:   Problem List Items Addressed This Visit       Genitourinary   CKD (chronic kidney disease), stage III ([place]) - Primary    Recently started on farxiga. Pt not complaining of diarrhea may be fluoxetine vs farxgia. Recommend trial off farxiga and to update after 5 days on symptoms.          Other   Moderate episode of recurrent major depressive disorder (HCC)    Improved on fluoxetine. Cont med       Relevant Medications   FLUoxetine (PROZAC) 20 MG capsule  Generalized anxiety disorder    Improved on fluoxetine 20 mg. Continue medication.        Relevant Medications   FLUoxetine (PROZAC) 20 MG capsule     Return in about 2 months (around 03/24/2021) for scheduled wellness.  Lesleigh Noe, MD

## 2021-01-22 NOTE — Assessment & Plan Note (Signed)
Improved on fluoxetine 20 mg. Continue medication.

## 2021-01-22 NOTE — Patient Instructions (Addendum)
#  Diarrhea  - Hold Farxiga for up to 5 days - monitor for improvement  - If diarrhea improves - message Dr. Juleen China and myself for next steps - if no change - recommend restarting Farxiga and let me know so we can determine next step   #Depression - continue Fluoxetine

## 2021-01-22 NOTE — Assessment & Plan Note (Signed)
Improved on fluoxetine. Cont med

## 2021-01-22 NOTE — Assessment & Plan Note (Signed)
Recently started on farxiga. Pt not complaining of diarrhea may be fluoxetine vs farxgia. Recommend trial off farxiga and to update after 5 days on symptoms.

## 2021-01-23 ENCOUNTER — Other Ambulatory Visit: Payer: Self-pay | Admitting: Family Medicine

## 2021-01-23 DIAGNOSIS — K219 Gastro-esophageal reflux disease without esophagitis: Secondary | ICD-10-CM

## 2021-01-31 ENCOUNTER — Telehealth: Payer: Self-pay | Admitting: Pulmonary Disease

## 2021-01-31 NOTE — Telephone Encounter (Signed)
Added Televisit to appointment desk for patient.

## 2021-01-31 NOTE — Telephone Encounter (Signed)
Can patient's visit for next Tuesday 8/23 be switched to a televisit due to not having a ride to the office?  Dr. [name] please advise  Thanks

## 2021-02-05 ENCOUNTER — Other Ambulatory Visit: Payer: Self-pay

## 2021-02-05 ENCOUNTER — Encounter: Payer: Self-pay | Admitting: Pulmonary Disease

## 2021-02-05 ENCOUNTER — Ambulatory Visit (INDEPENDENT_AMBULATORY_CARE_PROVIDER_SITE_OTHER): Payer: Medicare PPO | Admitting: Pulmonary Disease

## 2021-02-05 DIAGNOSIS — E662 Morbid (severe) obesity with alveolar hypoventilation: Secondary | ICD-10-CM | POA: Diagnosis not present

## 2021-02-05 DIAGNOSIS — G4733 Obstructive sleep apnea (adult) (pediatric): Secondary | ICD-10-CM | POA: Diagnosis not present

## 2021-02-05 NOTE — Progress Notes (Signed)
   Subjective:    Patient ID: Daisy [name], female    DOB: 04/24/1951, 70 y.o.   MRN: [id number]  HPI    I connected with  Daisy [name] on 02/05/21 by phone/  video enabled telemedicine application and verified that I am speaking with the correct person using two identifiers.     Location: Patient: Home Provider: Office - [facility] - [address], [address], [place], [place] [address]   I discussed the limitations of evaluation and management by telemedicine and the availability of in person appointments. The patient expressed understanding and agreed to proceed. I also discussed with the patient that there may be a patient responsible charge related to this service. The patient expressed understanding and agreed to proceed.   Patient consented to consult via telephone: Yes People present and their role in pt care: Pt   70y.o smoker for FU COPD, OSA and acute on chronic hypercarbic and hypoxic respiratory failure   She smoked starting in her 72s until the point of admission, more than 40 pack years Admitted 10/2019 for decompensated diastolic heart failure and cor pulmonale in the setting of untreated OSA/ OHS    ABG 5/4 was 7.17/108/89 -She was initially diuresed and treated with BiPAP , was discharged to [facility] but readmitted since BiPAP could not be supplied, she was readmitted 5/19-5/24 and eventually discharged on trilogy machine with full facemask   ABG at discharge was 7.3 6/70/78   PMH - Morbid obesity, chronic dyspnea, left breast cancer status post radiation, peripheral vascular disease, depression, arthritis, acid reflux disease  Chief Complaint  Patient presents with   Follow-up    Had covid week of fourth of July. Doing well. Going well with trilogy.   She is compliant with Trilogy by report.  Co-pay is very high and she complains about this Denies pedal edema, weight is at 339 pounds about 5 pounds lower than last visit.   Compliant with diuretic. She had COVID infection about 6 weeks ago, and her family had it, only had a mild illness, took antiviral but this gave her diarrhea   Significant tests/ events reviewed  NPSG 12/2019 >> no sig OSA , mild O2 desatn 82% lowest   PFTs 01/2020 mild restriction, TLC 80%, ratio 74, FVC 72%, FEV1 70%, DLCO 95%   09/2019: Ejection fraction 65% with possible RV failure and indeterminate diastolic dysfunction.   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 Also denies presyncope, palpitations, heartburn, abdominal pain, nausea, vomiting, diarrhea or change in bowel or urinary habits, dysuria,hematuria, rash, arthralgias, visual complaints, headache, numbness weakness or ataxia.     Objective:   Physical Exam Unable to perform since televisit       Assessment & Plan:    Total encounter time x 21 m

## 2021-02-05 NOTE — Assessment & Plan Note (Signed)
Weight loss again emphasized is the most important intervention here. PFTs have shown restriction related to obesity

## 2021-02-05 NOTE — Assessment & Plan Note (Signed)
She is compliant with Trelegy by report.  I think she will do just as well without a BiPAP this will help reduce her co-pay. We will send prescription for auto BiPAP EPAP of 5, pressure support of 10, IPAP maximum of 20.  And follow this up with a download.

## 2021-02-05 NOTE — Patient Instructions (Signed)
Change from NIV to auto biPAP EPAP 5 , IPAP max 20 ,PS +10 , DL in 1 month

## 2021-02-06 DIAGNOSIS — J9622 Acute and chronic respiratory failure with hypercapnia: Secondary | ICD-10-CM | POA: Diagnosis not present

## 2021-02-06 DIAGNOSIS — E662 Morbid (severe) obesity with alveolar hypoventilation: Secondary | ICD-10-CM | POA: Diagnosis not present

## 2021-02-06 DIAGNOSIS — I509 Heart failure, unspecified: Secondary | ICD-10-CM | POA: Diagnosis not present

## 2021-02-06 DIAGNOSIS — J9621 Acute and chronic respiratory failure with hypoxia: Secondary | ICD-10-CM | POA: Diagnosis not present

## 2021-02-21 DIAGNOSIS — J9622 Acute and chronic respiratory failure with hypercapnia: Secondary | ICD-10-CM | POA: Diagnosis not present

## 2021-02-21 DIAGNOSIS — J9621 Acute and chronic respiratory failure with hypoxia: Secondary | ICD-10-CM | POA: Diagnosis not present

## 2021-02-21 DIAGNOSIS — E662 Morbid (severe) obesity with alveolar hypoventilation: Secondary | ICD-10-CM | POA: Diagnosis not present

## 2021-03-09 DIAGNOSIS — I509 Heart failure, unspecified: Secondary | ICD-10-CM | POA: Diagnosis not present

## 2021-03-09 DIAGNOSIS — J9621 Acute and chronic respiratory failure with hypoxia: Secondary | ICD-10-CM | POA: Diagnosis not present

## 2021-03-09 DIAGNOSIS — E662 Morbid (severe) obesity with alveolar hypoventilation: Secondary | ICD-10-CM | POA: Diagnosis not present

## 2021-03-09 DIAGNOSIS — J9622 Acute and chronic respiratory failure with hypercapnia: Secondary | ICD-10-CM | POA: Diagnosis not present

## 2021-03-18 ENCOUNTER — Ambulatory Visit: Payer: Medicare PPO

## 2021-03-20 ENCOUNTER — Other Ambulatory Visit: Payer: Self-pay

## 2021-03-20 ENCOUNTER — Ambulatory Visit (INDEPENDENT_AMBULATORY_CARE_PROVIDER_SITE_OTHER): Payer: Medicare PPO | Admitting: Family Medicine

## 2021-03-20 VITALS — BP 128/82 | HR 73 | Temp 97.0°F | Ht 66.0 in | Wt 339.5 lb

## 2021-03-20 DIAGNOSIS — Z1211 Encounter for screening for malignant neoplasm of colon: Secondary | ICD-10-CM

## 2021-03-20 DIAGNOSIS — E782 Mixed hyperlipidemia: Secondary | ICD-10-CM | POA: Diagnosis not present

## 2021-03-20 DIAGNOSIS — N1832 Chronic kidney disease, stage 3b: Secondary | ICD-10-CM | POA: Diagnosis not present

## 2021-03-20 DIAGNOSIS — I5032 Chronic diastolic (congestive) heart failure: Secondary | ICD-10-CM | POA: Diagnosis not present

## 2021-03-20 DIAGNOSIS — Z23 Encounter for immunization: Secondary | ICD-10-CM | POA: Diagnosis not present

## 2021-03-20 DIAGNOSIS — R7303 Prediabetes: Secondary | ICD-10-CM | POA: Diagnosis not present

## 2021-03-20 DIAGNOSIS — K219 Gastro-esophageal reflux disease without esophagitis: Secondary | ICD-10-CM

## 2021-03-20 DIAGNOSIS — Z0000 Encounter for general adult medical examination without abnormal findings: Secondary | ICD-10-CM | POA: Diagnosis not present

## 2021-03-20 LAB — CBC WITH DIFFERENTIAL/PLATELET
Basophils Absolute: 0 10*3/uL (ref 0.0–0.1)
Basophils Relative: 0.5 % (ref 0.0–3.0)
Eosinophils Absolute: 0.1 10*3/uL (ref 0.0–0.7)
Eosinophils Relative: 1.2 % (ref 0.0–5.0)
HCT: 43.8 % (ref 36.0–46.0)
Hemoglobin: 14.2 g/dL (ref 12.0–15.0)
Lymphocytes Relative: 22.2 % (ref 12.0–46.0)
Lymphs Abs: 1.8 10*3/uL (ref 0.7–4.0)
MCHC: 32.3 g/dL (ref 30.0–36.0)
MCV: 96.7 fl (ref 78.0–100.0)
Monocytes Absolute: 0.6 10*3/uL (ref 0.1–1.0)
Monocytes Relative: 6.7 % (ref 3.0–12.0)
Neutro Abs: 5.7 10*3/uL (ref 1.4–7.7)
Neutrophils Relative %: 69.4 % (ref 43.0–77.0)
Platelets: 229 10*3/uL (ref 150.0–400.0)
RBC: 4.53 Mil/uL (ref 3.87–5.11)
RDW: 13.8 % (ref 11.5–15.5)
WBC: 8.2 10*3/uL (ref 4.0–10.5)

## 2021-03-20 LAB — PHOSPHORUS: Phosphorus: 3.5 mg/dL (ref 2.3–4.6)

## 2021-03-20 LAB — LIPID PANEL
Cholesterol: 155 mg/dL (ref 0–200)
HDL: 43.2 mg/dL (ref >39.00)
LDL Cholesterol: 83 mg/dL (ref 0–99)
NonHDL: 111.87
Total CHOL/HDL Ratio: 4
Triglycerides: 145 mg/dL (ref 0.0–149.0)
VLDL: 29 mg/dL (ref 0.0–40.0)

## 2021-03-20 LAB — COMPREHENSIVE METABOLIC PANEL
ALT: 11 U/L (ref 0–35)
AST: 14 U/L (ref 0–37)
Albumin: 3.9 g/dL (ref 3.5–5.2)
Alkaline Phosphatase: 85 U/L (ref 39–117)
BUN: 19 mg/dL (ref 6–23)
CO2: 27 mEq/L (ref 19–32)
Calcium: 9.4 mg/dL (ref 8.4–10.5)
Chloride: 104 mEq/L (ref 96–112)
Creatinine, Ser: 1.64 mg/dL — ABNORMAL HIGH (ref 0.40–1.20)
GFR: 31.52 mL/min — ABNORMAL LOW (ref >60.00)
Glucose, Bld: 105 mg/dL — ABNORMAL HIGH (ref 70–99)
Potassium: 4.6 mEq/L (ref 3.5–5.1)
Sodium: 141 mEq/L (ref 135–145)
Total Bilirubin: 0.5 mg/dL (ref 0.2–1.2)
Total Protein: 6.6 g/dL (ref 6.0–8.3)

## 2021-03-20 LAB — HEMOGLOBIN A1C: Hgb A1c MFr Bld: 6.1 % (ref 4.6–6.5)

## 2021-03-20 MED ORDER — TORSEMIDE 10 MG PO TABS: 10.0000 mg | ORAL_TABLET | Freq: Two times a day (BID) | ORAL | 3 refills | Status: DC

## 2021-03-20 MED ORDER — METOPROLOL SUCCINATE ER 25 MG PO TB24: 12.5000 mg | ORAL_TABLET | Freq: Every day | ORAL | 3 refills | Status: DC

## 2021-03-20 MED ORDER — PANTOPRAZOLE SODIUM 20 MG PO TBEC: 20.0000 mg | DELAYED_RELEASE_TABLET | Freq: Every day | ORAL | 3 refills | Status: AC

## 2021-03-20 MED ORDER — LOSARTAN POTASSIUM 25 MG PO TABS: 25.0000 mg | ORAL_TABLET | Freq: Every day | ORAL | 3 refills | Status: DC

## 2021-03-20 NOTE — Progress Notes (Signed)
Subjective:   Daisy [name] is a 70 y.o. female who presents for Medicare Annual (Subsequent) preventive examination.  Review of Systems    Review of Systems  Constitutional:  Negative for chills and fever.  HENT:  Negative for congestion and sore throat.   Eyes:  Negative for blurred vision and double vision.  Respiratory:  Negative for shortness of breath.   Cardiovascular:  Negative for chest pain.  Gastrointestinal:  Negative for heartburn, nausea and vomiting.  Genitourinary: Negative.   Musculoskeletal: Negative.  Negative for myalgias.  Skin:  Negative for rash.  Neurological:  Negative for dizziness and headaches.  Endo/Heme/Allergies:  Does not bruise/bleed easily.  Psychiatric/Behavioral:  Negative for depression. The patient is not nervous/anxious.    Cardiac Risk Factors include: advanced age (>62men, >54 women);dyslipidemia;hypertension     Objective:    Today's Vitals   03/20/21 1033  BP: 128/82  Pulse: 73  Temp: (!) 97 F (36.1 C)  TempSrc: Temporal  SpO2: 95%  Weight: (!) 339 lb 8 oz (154 kg)  Height: 5\' 6"  (1.676 m)   Body mass index is 54.8 kg/m.  Advanced Directives 03/20/2021 03/15/2020 12/31/2019 11/03/2019 10/19/2019 10/17/2019 10/17/2019  Does Patient Have a Medical Advance Directive? No No No No No No No  Would patient like information on creating a medical advance directive? Yes (MAU/Ambulatory/Procedural Areas - Information given) No - Patient declined No - Patient declined No - Patient declined No - Patient declined Yes (Inpatient - patient requests chaplain consult to create a medical advance directive) No - Patient declined    Current Medications (verified) Outpatient Encounter Medications as of 03/20/2021  Medication Sig   acetaminophen (TYLENOL) 325 MG tablet Take 2 tablets (650 mg total) by mouth every 4 (four) hours as needed for headache or mild pain.   albuterol (VENTOLIN HFA) 108 (90 Base) MCG/ACT inhaler TAKE 2 PUFFS BY MOUTH EVERY 6  HOURS AS NEEDED FOR WHEEZE OR SHORTNESS OF BREATH   anastrozole (ARIMIDEX) 1 MG tablet Take 1 tablet (1 mg total) by mouth daily.   aspirin 81 MG chewable tablet Chew 81 mg by mouth daily.   Bempedoic Acid-Ezetimibe (NEXLIZET) 180-10 MG TABS Take 1 tablet by mouth daily.   cilostazol (PLETAL) 100 MG tablet TAKE 1 TABLET BY MOUTH TWICE A DAY   dapagliflozin propanediol (FARXIGA) 5 MG TABS tablet Take by mouth daily.   FLUoxetine (PROZAC) 20 MG capsule Take 1 capsule (20 mg total) by mouth daily.   KLOR-CON M20 20 MEQ tablet TAKE 1 TABLET BY MOUTH EVERY DAY   vitamin B-12 (CYANOCOBALAMIN) 500 MCG tablet Take by mouth.   [DISCONTINUED] losartan (COZAAR) 25 MG tablet Take 1 tablet by mouth daily.   [DISCONTINUED] metoprolol succinate (TOPROL-XL) 25 MG 24 hr tablet Take 0.5 tablets (12.5 mg total) by mouth daily.   [DISCONTINUED] pantoprazole (PROTONIX) 20 MG tablet TAKE 1 TABLET BY MOUTH EVERY DAY   [DISCONTINUED] torsemide (DEMADEX) 10 MG tablet Take 1 tablet (10 mg total) by mouth 2 (two) times daily.   losartan (COZAAR) 25 MG tablet Take 1 tablet (25 mg total) by mouth daily.   metoprolol succinate (TOPROL-XL) 25 MG 24 hr tablet Take 0.5 tablets (12.5 mg total) by mouth daily.   pantoprazole (PROTONIX) 20 MG tablet Take 1 tablet (20 mg total) by mouth daily.   torsemide (DEMADEX) 10 MG tablet Take 1 tablet (10 mg total) by mouth 2 (two) times daily.   No facility-administered encounter medications on file as of 03/20/2021.  Allergies (verified) Elemental sulfur, Levaquin [levofloxacin], and Statins   History: Past Medical History:  Diagnosis Date   Acid reflux    Acute heart failure ([place]) 10/17/2019   AKI (acute kidney injury) ([place]) 10/17/2019   Arthritis    Breast cancer of upper-outer quadrant of right female breast ([place]) 11/19/2016   Chicken pox    Cholecystitis    Chronic diastolic heart failure ([place]) 01/04/2020   CKD (chronic kidney disease), stage III ([place]) 01/04/2020   Depression     Dyspnea    History of radiation therapy 01/20/17-03/02/17   left breast was treated to 50.4 Gy in 18 fractions   HLD (hyperlipidemia) 01/04/2020   Hyperlipemia    Irregular heart rate    Malignant neoplasm of upper-outer quadrant of left breast in female, estrogen receptor positive ([place]) 09/30/2016   Morbid obesity with BMI of 50.0-59.9, adult ([place]) 01/04/2020   Obesity    Peripheral vascular disease ([place])    Pneumonia    Prediabetes 01/04/2020   Urinary tract infection    Urinary tract infection with hematuria    Past Surgical History:  Procedure Laterality Date   ABDOMINAL HYSTERECTOMY     BREAST LUMPECTOMY Left 11/19/2016   BREAST LUMPECTOMY WITH RADIOACTIVE SEED AND SENTINEL LYMPH NODE BIOPSY (Left)   BREAST LUMPECTOMY WITH RADIOACTIVE SEED AND SENTINEL LYMPH NODE BIOPSY Left 11/19/2016   Procedure: BREAST LUMPECTOMY WITH RADIOACTIVE SEED AND SENTINEL LYMPH NODE BIOPSY;  Surgeon: Rolm Bookbinder, MD;  Location: [facility];  Service: General;  Laterality: Left;   CESAREAN SECTION     CESAREAN SECTION     x 2   CHOLECYSTECTOMY  2007   KNEE ARTHROSCOPY     bilateral   KNEE SURGERY Bilateral    LOWER EXTREMITY ANGIOGRAM N/A 04/04/2014   Procedure: LOWER EXTREMITY ANGIOGRAM;  Surgeon: Laverda Page, MD;  Location: [facility] C[facility] LAB;  Service: Cardiovascular;  Laterality: N/A;   OVARIAN CYST REMOVAL     RE-EXCISION OF BREAST LUMPECTOMY Left 12/12/2016   Procedure: RE-EXCISION OF LEFT BREAST LUMPECTOMY;  Surgeon: Rolm Bookbinder, MD;  Location: [facility];  Service: General;  Laterality: Left;   ROTATOR CUFF REPAIR Right    TONSILLECTOMY     WRIST SURGERY Bilateral    Family History  Problem Relation Age of Onset   Heart disease Mother    Lung cancer Father    Heart disease Father    Heart disease Brother    Social History   Socioeconomic History   Marital status: Single    Spouse name: Not on file   Number of children: 2   Years of education: high school   Highest education  level: Not on file  Occupational History   Not on file  Tobacco Use   Smoking status: Former    Packs/day: 1.00    Years: 30.00    Pack years: 30.00    Types: E-cigarettes, Cigarettes    Quit date: 11/02/2019    Years since quitting: 1.3   Smokeless tobacco: Never  Vaping Use   Vaping Use: Never used  Substance and Sexual Activity   Alcohol use: No   Drug use: No   Sexual activity: Not Currently  Other Topics Concern   Not on file  Social History Narrative   07/24/20   From: the area   Living: with [name] (daughter) and her daughter-in-law   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      Family: Raymond and [name] -  no grandchildren      Enjoys: Programmer, systems (lab, Barista), play cards      Exercise: not currently    Diet: limits fried foods, tries to follow heart healthy - baked and grilled chicken, limit canned products      Safety   Seat belts: Yes    Guns: Yes  and secure   Safe in relationships: Yes    Social Determinants of Health   Financial Resource Strain: Medium Risk   Difficulty of Paying Living Expenses: Somewhat hard  Food Insecurity: Not on file  Transportation Needs: Not on file  Physical Activity: Not on file  Stress: Not on file  Social Connections: Not on file    Tobacco Counseling Counseling given: Not Answered   Clinical Intake:  Pre-visit preparation completed: No  Pain : No/denies pain     BMI - recorded: 54.8 Nutritional Status: BMI > 30  Obese Nutritional Risks: None Diabetes: No CBG done?: No Did pt. bring in CBG monitor from home?: No  How often do you need to have someone help you when you read instructions, pamphlets, or other written materials from your doctor or pharmacy?: 1 - Never What is the last grade level you completed in school?: high school  Diabetic? no  Interpreter Needed?: No      Activities of Daily Living In your present state of health, do you have any difficulty  performing the following activities: 03/20/2021  Hearing? N  Vision? Y  Comment planning to see eye doctor  Difficulty concentrating or making decisions? Y  Walking or climbing stairs? Y  Comment knees  Dressing or bathing? Y  Comment daughter helps with transfer  Doing errands, shopping? N  Preparing Food and eating ? Y  Comment family helps  Using the Toilet? N  In the past six months, have you accidently leaked urine? Y  Comment using depends, does not want a specialist  Do you have problems with loss of bowel control? N  Managing your Medications? N  Managing your Finances? N  Housekeeping or managing your Housekeeping? Y  Some recent data might be hidden    Patient Care Team: Lesleigh Noe, MD as PCP - General (Family Medicine) Magrinat, Virgie Dad, MD as Consulting Physician (Oncology) Adrian Prows, MD as Consulting Physician (Cardiology) Rigoberto Noel, MD as Consulting Physician (Pulmonary Disease) Lavonia Dana, MD as Consulting Physician (Nephrology)  Indicate any recent Medical Services you may have received from other than [facility] providers in the past year (date may be approximate).     Assessment:   This is a routine wellness examination for [place].  Hearing/Vision screen Hearing Screening   250Hz  500Hz  1000Hz  2000Hz  4000Hz   Right ear 20 20 20 20 20   Left ear 20 20 20 20 20    Vision Screening   Right eye Left eye Both eyes  Without correction n/a 20/50 20/40  With correction     Comments: Macular degeneration    Dietary issues and exercise activities discussed: Current Exercise Habits: The patient does not participate in regular exercise at present, Exercise limited by: orthopedic condition(s)   Goals Addressed             This Visit's Progress    Patient Stated       Maintain current health      Depression Screen PHQ 2/9 Scores 12/24/2020 07/24/2020 03/15/2020 01/04/2020 03/26/2017 12/25/2016  PHQ - 2 Score 6 4 0 0 0 0  PHQ- 9 Score 15 7 - 0 - -  Fall Risk Fall Risk  03/20/2021 03/15/2020 01/04/2020 09/21/2017 03/26/2017  Falls in the past year? 1 0 0 No No  Number falls in past yr: 0 0 - - -  Injury with Fall? 0 - - - -  Risk for fall due to : - - History of fall(s);Impaired balance/gait - -  Follow up - Falls evaluation completed Falls evaluation completed - -    FALL RISK PREVENTION PERTAINING TO THE HOME:  Any stairs in or around the home? No  If so, are there any without handrails? Yes  Home free of loose throw rugs in walkways, pet beds, electrical cords, etc? Yes  Adequate lighting in your home to reduce risk of falls? Yes   ASSISTIVE DEVICES UTILIZED TO PREVENT FALLS:  Life alert? No  Use of a cane, walker or w/c? Yes  Grab bars in the bathroom? Yes  Shower chair or bench in shower? Yes  Elevated toilet seat or a handicapped toilet? Yes    Cognitive Function:       Mini-Cog - 03/20/21 1052     Normal clock drawing test? yes    How many words correct? 3                Immunizations Immunization History  Administered Date(s) Administered   Fluad Quad(high Dose 65+) 03/26/2020   PFIZER(Purple Top)SARS-COV-2 Vaccination 08/12/2019, 09/07/2019, 06/05/2020   Pneumococcal Polysaccharide-23 05/23/2020   Tdap 05/25/2011    TDAP status: Up to date  Flu Vaccine status: Completed at today's visit  Pneumococcal vaccine status: Due, Education has been provided regarding the importance of this vaccine. Advised may receive this vaccine at local pharmacy or Health Dept. Aware to provide a copy of the vaccination record if obtained from local pharmacy or Health Dept. Verbalized acceptance and understanding.  Covid-19 vaccine status: Information provided on how to obtain vaccines.   Qualifies for Shingles Vaccine? Yes   Zostavax completed Yes   Shingrix Completed?: No.    Education has been provided regarding the importance of this vaccine. Patient has been advised to call insurance company to determine out of  pocket expense if they have not yet received this vaccine. Advised may also receive vaccine at local pharmacy or Health Dept. Verbalized acceptance and understanding.  Screening Tests Health Maintenance  Topic Date Due   Zoster Vaccines- Shingrix (1 of 2) Never done   COLONOSCOPY (Pts 45-37yrs Insurance coverage will need to be confirmed)  Never done   DEXA SCAN  Never done   COVID-19 Vaccine (4 - Booster for Pfizer series) 08/28/2020   INFLUENZA VACCINE  01/14/2021   TETANUS/TDAP  05/24/2021   MAMMOGRAM  12/13/2022   Hepatitis C Screening  Completed   HPV VACCINES  Aged Out    Health Maintenance  Health Maintenance Due  Topic Date Due   Zoster Vaccines- Shingrix (1 of 2) Never done   COLONOSCOPY (Pts 45-43yrs Insurance coverage will need to be confirmed)  Never done   DEXA SCAN  Never done   COVID-19 Vaccine (4 - Booster for Pfizer series) 08/28/2020   INFLUENZA VACCINE  01/14/2021    Colorectal cancer screening: Type of screening: Cologuard. Completed not done. Repeat every 3 years  Mammogram status: Completed 11/2020. Repeat every year  Bone Density status: Ordered today. Pt provided with contact info and advised to call to schedule appt.  Lung Cancer Screening: (Low Dose CT Chest recommended if Age 55-80 years, 30 pack-year currently smoking OR have quit w/in 15years.) does qualify.  Lung Cancer Screening Referral: declined  Additional Screening:  Hepatitis C Screening: does qualify; Completed 12/2019  Vision Screening: Recommended annual ophthalmology exams for early detection of glaucoma and other disorders of the eye. Is the patient up to date with their annual eye exam?  Yes    Dental Screening: Recommended annual dental exams for proper oral hygiene  Community Resource Referral / Chronic Care Management: CRR required this visit?  Yes   CCM required this visit?  Yes      Plan:     I have personally reviewed and noted the following in the patient's  chart:   Medical and social history Use of alcohol, tobacco or illicit drugs  Current medications and supplements including opioid prescriptions.  Functional ability and status Nutritional status Physical activity Advanced directives List of other physicians Hospitalizations, surgeries, and ER visits in previous 12 months Vitals Screenings to include cognitive, depression, and falls Referrals and appointments  In addition, I have reviewed and discussed with patient certain preventive protocols, quality metrics, and best practice recommendations. A written personalized care plan for preventive services as well as general preventive health recommendations were provided to patient.     Lesleigh Noe, MD   03/20/2021

## 2021-03-20 NOTE — Patient Instructions (Addendum)
Shingles with your pharmacy  Call or mychart next to get DEXA with [facility] and mammo  Cologuard to your house

## 2021-03-21 LAB — EXTRA SPECIMEN

## 2021-03-21 LAB — PARATHYROID HORMONE, INTACT (NO CA): PTH: 90 pg/mL — ABNORMAL HIGH (ref 16–77)

## 2021-03-23 DIAGNOSIS — J9622 Acute and chronic respiratory failure with hypercapnia: Secondary | ICD-10-CM | POA: Diagnosis not present

## 2021-03-23 DIAGNOSIS — J9621 Acute and chronic respiratory failure with hypoxia: Secondary | ICD-10-CM | POA: Diagnosis not present

## 2021-03-23 DIAGNOSIS — E662 Morbid (severe) obesity with alveolar hypoventilation: Secondary | ICD-10-CM | POA: Diagnosis not present

## 2021-03-26 DIAGNOSIS — E1122 Type 2 diabetes mellitus with diabetic chronic kidney disease: Secondary | ICD-10-CM | POA: Diagnosis not present

## 2021-03-26 DIAGNOSIS — E876 Hypokalemia: Secondary | ICD-10-CM | POA: Diagnosis not present

## 2021-03-26 DIAGNOSIS — I129 Hypertensive chronic kidney disease with stage 1 through stage 4 chronic kidney disease, or unspecified chronic kidney disease: Secondary | ICD-10-CM | POA: Diagnosis not present

## 2021-03-26 DIAGNOSIS — N2581 Secondary hyperparathyroidism of renal origin: Secondary | ICD-10-CM | POA: Diagnosis not present

## 2021-03-26 DIAGNOSIS — R809 Proteinuria, unspecified: Secondary | ICD-10-CM | POA: Diagnosis not present

## 2021-03-26 DIAGNOSIS — N1832 Chronic kidney disease, stage 3b: Secondary | ICD-10-CM | POA: Diagnosis not present

## 2021-03-26 DIAGNOSIS — I509 Heart failure, unspecified: Secondary | ICD-10-CM | POA: Diagnosis not present

## 2021-03-28 ENCOUNTER — Other Ambulatory Visit: Payer: Self-pay | Admitting: Cardiology

## 2021-04-08 ENCOUNTER — Other Ambulatory Visit: Payer: Self-pay | Admitting: Student

## 2021-04-08 DIAGNOSIS — E662 Morbid (severe) obesity with alveolar hypoventilation: Secondary | ICD-10-CM | POA: Diagnosis not present

## 2021-04-08 DIAGNOSIS — J9622 Acute and chronic respiratory failure with hypercapnia: Secondary | ICD-10-CM | POA: Diagnosis not present

## 2021-04-08 DIAGNOSIS — J9621 Acute and chronic respiratory failure with hypoxia: Secondary | ICD-10-CM | POA: Diagnosis not present

## 2021-04-08 DIAGNOSIS — E7800 Pure hypercholesterolemia, unspecified: Secondary | ICD-10-CM

## 2021-04-08 DIAGNOSIS — I739 Peripheral vascular disease, unspecified: Secondary | ICD-10-CM

## 2021-04-08 DIAGNOSIS — I509 Heart failure, unspecified: Secondary | ICD-10-CM | POA: Diagnosis not present

## 2021-04-22 ENCOUNTER — Other Ambulatory Visit: Payer: Medicare PPO

## 2021-04-23 DIAGNOSIS — J9621 Acute and chronic respiratory failure with hypoxia: Secondary | ICD-10-CM | POA: Diagnosis not present

## 2021-04-23 DIAGNOSIS — E662 Morbid (severe) obesity with alveolar hypoventilation: Secondary | ICD-10-CM | POA: Diagnosis not present

## 2021-04-23 DIAGNOSIS — J9622 Acute and chronic respiratory failure with hypercapnia: Secondary | ICD-10-CM | POA: Diagnosis not present

## 2021-04-30 ENCOUNTER — Ambulatory Visit: Payer: Medicare PPO | Admitting: Oncology

## 2021-04-30 ENCOUNTER — Other Ambulatory Visit: Payer: Medicare PPO

## 2021-05-22 ENCOUNTER — Other Ambulatory Visit: Payer: Self-pay | Admitting: Cardiology

## 2021-05-22 ENCOUNTER — Other Ambulatory Visit: Payer: Self-pay | Admitting: Oncology

## 2021-05-22 ENCOUNTER — Other Ambulatory Visit: Payer: Self-pay | Admitting: Family Medicine

## 2021-05-22 DIAGNOSIS — I5032 Chronic diastolic (congestive) heart failure: Secondary | ICD-10-CM

## 2021-06-03 ENCOUNTER — Other Ambulatory Visit: Payer: Self-pay | Admitting: *Deleted

## 2021-06-03 DIAGNOSIS — C50412 Malignant neoplasm of upper-outer quadrant of left female breast: Secondary | ICD-10-CM

## 2021-06-03 NOTE — Progress Notes (Signed)
[place]  [phone] Fax:([phone]     ID: Daisy [name] DOB: 09/23/1950  MR#: [id number]  NTI#:[id number]  Patient Care Team: Daisy Noe, MD as PCP - General (Family Medicine) Daisy [name], Daisy Dad, MD as Consulting Physician (Oncology) Daisy Prows, MD as Consulting Physician (Cardiology) Daisy Noel, MD as Consulting Physician (Pulmonary Disease) Daisy Dana, MD as Consulting Physician (Nephrology) Daisy Cruel, MD OTHER MD:  CHIEF COMPLAINT: Estrogen receptor positive breast cancer  CURRENT TREATMENT: Anastrozole   INTERVAL HISTORY: Daisy [name] returns today for follow-up of her estrogen receptor positive breast cancer accompanied by her daughter Daisy [name].  She continues on anastrozole, with good tolerance.  Hot flashes are not a problem and vaginal dryness is a very minimal problem for her.  Since her last visit, she underwent bilateral diagnostic mammography with tomography at [facility] on 12/12/2020, for 6 month follow up of probably-dystrophic left breast calcifications, showing: breast density category A; stable architectural distortion in left breast resembles a post-lumpectomy scar; curvilinear calcifications within lumpectomy bed appear stable and are probably benign, early dystrophic. Recommendation is to return for diagnostic mammography in 12 months.   REVIEW OF SYSTEMS: Daisy [name] was recently admitted with respiratory failure.  She was found to not have sleep apnea but she does have breathing problems and she is using Trelegy at 1 L/min at bedtime, of which functions like CPAP.  She does not exercise regularly.  At home she does use a walker.  She tells me there have been no falls.  A detailed review of systems was otherwise stable   COVID 19 VACCINATION STATUS: Daisy [name] x3, also had COVID in June 2022.  She received Paxlobid and did well with it.   BREAST CANCER HISTORY: From the original intake note:  Daisy [name] had routine  screening mammography at [facility] 09/17/2016. The breast density was category A. At the 12:00 location anteriorly in the left breast there was an area of linear calcifications measuring 0.3 cm. There was also an area of architectural distortion in the left breast upper outer quadrant. On 09/23/2016 the patient underwent left diagnostic mammography and this confirmed an area of calcifications and architectural distortion. Accordingly on 09/24/2016 the patient underwent left breast ultrasonography which did not show any abnormality in the left breast or left axilla.  On 09/24/2016 the patient underwent biopsy of the left breast area of architectural distortion and this showed ([id number]) invasive ductal carcinoma, grade 1, estrogen receptor 100% positive, and progesterone receptor 100% positive, both with strong staining intensity, with an MIB-1 of 5%, and no HER-2 amplification, the signals ratio being 1.51 and the number per cell 2.65.  The patient's subsequent history is as detailed below.   PAST MEDICAL HISTORY: Past Medical History:  Diagnosis Date   Acid reflux    Acute heart failure ([place]) 10/17/2019   AKI (acute kidney injury) ([place]) 10/17/2019   Arthritis    Breast cancer of upper-outer quadrant of right female breast ([place]) 11/19/2016   Chicken pox    Cholecystitis    Chronic diastolic heart failure ([place]) 01/04/2020   CKD (chronic kidney disease), stage III ([place]) 01/04/2020   Depression    Dyspnea    History of radiation therapy 01/20/17-03/02/17   left breast was treated to 50.4 Gy in 18 fractions   HLD (hyperlipidemia) 01/04/2020   Hyperlipemia    Irregular heart rate    Malignant neoplasm of upper-outer quadrant of left breast in female, estrogen receptor positive ([place]) 09/30/2016   Morbid obesity  with BMI of 50.0-59.9, adult ([place]) 01/04/2020   Obesity    Peripheral vascular disease ([place])    Pneumonia    Prediabetes 01/04/2020   Urinary tract infection    Urinary tract infection with  hematuria     PAST SURGICAL HISTORY: Past Surgical History:  Procedure Laterality Date   ABDOMINAL HYSTERECTOMY     BREAST LUMPECTOMY Left 11/19/2016   BREAST LUMPECTOMY WITH RADIOACTIVE SEED AND SENTINEL LYMPH NODE BIOPSY (Left)   BREAST LUMPECTOMY WITH RADIOACTIVE SEED AND SENTINEL LYMPH NODE BIOPSY Left 11/19/2016   Procedure: BREAST LUMPECTOMY WITH RADIOACTIVE SEED AND SENTINEL LYMPH NODE BIOPSY;  Surgeon: Rolm Bookbinder, MD;  Location: [facility];  Service: General;  Laterality: Left;   CESAREAN SECTION     CESAREAN SECTION     x 2   CHOLECYSTECTOMY  2007   KNEE ARTHROSCOPY     bilateral   KNEE SURGERY Bilateral    LOWER EXTREMITY ANGIOGRAM N/A 04/04/2014   Procedure: LOWER EXTREMITY ANGIOGRAM;  Surgeon: Laverda Page, MD;  Location[facility] C[facility] LAB;  Service: Cardiovascular;  Laterality: N/A;   OVARIAN CYST REMOVAL     RE-EXCISION OF BREAST LUMPECTOMY Left 12/12/2016   Procedure: RE-EXCISION OF LEFT BREAST LUMPECTOMY;  Surgeon: Rolm Bookbinder, MD;  Location: [facility];  Service: General;  Laterality: Left;   ROTATOR CUFF REPAIR Right    TONSILLECTOMY     WRIST SURGERY Bilateral     FAMILY HISTORY Family History  Problem Relation Age of Onset   Heart disease Mother    Lung cancer Father    Heart disease Father    Heart disease Brother   The patient's father died at age 5 from lung cancer in the setting of tobacco abuse. The patient's mother died at the age of 21 from pneumonia. The patient has 3 brothers, 1 sister. There is no history of breast or ovarian cancer in the family.   GYNECOLOGIC HISTORY:  No LMP recorded. Patient has had a hysterectomy. Menarche age 73, first live birth age 77. The patient is GX P2. She underwent simple hysterectomy without salpingo-oophorectomy in 1986. She did not take hormone replacement. She did use oral contraceptives remotely for about 5 years, with no complications.   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 She now lives at home by herself along with her dog Maurene Capes. Her daughter Daisy Mcgregor worked as a Audiological scientist for 30 years but retired 2020 and now works for [employer] in [place]. Daughter [name] lives in [place] and works as a Gaffer. The patient has no grandchildren. She is a Psychologist, forensic.    ADVANCED DIRECTIVES: Not in place. The patient was given the appropriate documents to complete and notarize at her discretion in course of her 10/01/2016 visit   HEALTH MAINTENANCE: Social History   Tobacco Use   Smoking status: Former    Packs/day: 1.00    Years: 30.00    Pack years: 30.00    Types: E-cigarettes, Cigarettes    Quit date: 11/02/2019    Years since quitting: 1.5   Smokeless tobacco: Never  Vaping Use   Vaping Use: Never used  Substance Use Topics   Alcohol use: No   Drug use: No     Colonoscopy:Never  PAP:  Bone density: Never   Allergies  Allergen Reactions   Elemental Sulfur Hives   Levaquin [Levofloxacin] Other (See Comments)    Body aches   Statins     Current Outpatient  Medications  Medication Sig Dispense Refill   acetaminophen (TYLENOL) 325 MG tablet Take 2 tablets (650 mg total) by mouth every 4 (four) hours as needed for headache or mild pain.     albuterol (VENTOLIN HFA) 108 (90 Base) MCG/ACT inhaler TAKE 2 PUFFS BY MOUTH EVERY 6 HOURS AS NEEDED FOR WHEEZE OR SHORTNESS OF BREATH 17 each 3   anastrozole (ARIMIDEX) 1 MG tablet TAKE 1 TABLET BY MOUTH EVERY DAY 90 tablet 4   aspirin 81 MG chewable tablet Chew 81 mg by mouth daily.     cilostazol (PLETAL) 100 MG tablet TAKE 1 TABLET BY MOUTH TWICE A DAY 180 tablet 2   dapagliflozin propanediol (FARXIGA) 5 MG TABS tablet Take by mouth daily.     FLUoxetine (PROZAC) 20 MG capsule Take 1 capsule (20 mg total) by mouth daily. 90 capsule 1   KLOR-CON M20 20 MEQ tablet TAKE 1 TABLET BY MOUTH EVERY DAY 90 tablet 1   losartan (COZAAR) 25 MG tablet Take 1 tablet (25 mg total) by mouth daily. 90  tablet 3   metoprolol succinate (TOPROL-XL) 25 MG 24 hr tablet Take 0.5 tablets (12.5 mg total) by mouth daily. 90 tablet 3   NEXLIZET 180-10 MG TABS TAKE 1 TABLET BY MOUTH EVERY DAY 90 tablet 3   pantoprazole (PROTONIX) 20 MG tablet Take 1 tablet (20 mg total) by mouth daily. 90 tablet 3   torsemide (DEMADEX) 10 MG tablet Take 1 tablet (10 mg total) by mouth 2 (two) times daily. 180 tablet 3   vitamin B-12 (CYANOCOBALAMIN) 500 MCG tablet Take by mouth.     No current facility-administered medications for this visit.    OBJECTIVE: white woman examined in a wheelchair  Vitals:   06/04/21 1354  BP: (!) 135/58  Pulse: 83  Resp: 18  Temp: 97.9 F (36.6 C)  SpO2: 98%      Body mass index is 53.94 kg/m.    ECOG FS:2 - Symptomatic, <50% confined to bed  Sclerae unicteric, EOMs intact Wearing a mask No cervical or supraclavicular adenopathy Lungs no rales or rhonchi Heart regular rate and rhythm Abd soft obese, nontender, positive bowel sounds MSK no focal spinal tenderness, no upper extremity lymphedema Neuro: nonfocal, well oriented, appropriate affect Breasts: The right breast is unremarkable.  The left breast is status postlumpectomy and radiation.  There is no evidence of local recurrence.  Both axillae are benign   LAB RESULTS:  CMP     Component Value Date/Time   NA 141 03/20/2021 1119   NA 143 02/14/2020 1024   NA 137 02/27/2017 1007   K 4.6 03/20/2021 1119   K 4.4 02/27/2017 1007   CL 104 03/20/2021 1119   CL 105 04/08/2014 0355   CO2 27 03/20/2021 1119   CO2 32 (H) 02/27/2017 1007   GLUCOSE 105 (H) 03/20/2021 1119   GLUCOSE 127 02/27/2017 1007   BUN 19 03/20/2021 1119   BUN 17 02/14/2020 1024   BUN 7.7 02/27/2017 1007   CREATININE 1.64 (H) 03/20/2021 1119   CREATININE 2.02 (H) 04/27/2020 1136   CREATININE 0.9 02/27/2017 1007   CALCIUM 9.4 03/20/2021 1119   CALCIUM 10.2 02/27/2017 1007   PROT 6.6 03/20/2021 1119   PROT 7.0 02/27/2017 1007   ALBUMIN 3.9  03/20/2021 1119   ALBUMIN 3.3 (L) 02/27/2017 1007   AST 14 03/20/2021 1119   AST 14 (L) 04/27/2020 1136   AST 14 02/27/2017 1007   ALT 11 03/20/2021 1119   ALT  9 04/27/2020 1136   ALT 9 02/27/2017 1007   ALKPHOS 85 03/20/2021 1119   ALKPHOS 78 02/27/2017 1007   BILITOT 0.5 03/20/2021 1119   BILITOT 0.6 04/27/2020 1136   BILITOT 0.60 02/27/2017 1007   GFRNONAA 26 (L) 04/27/2020 1136   GFRNONAA 56 (L) 04/08/2014 0355   GFRAA 42 (L) 02/14/2020 1024   GFRAA >60 04/08/2014 0355    No results found for: Ronnald Ramp, A1GS, A2GS, BETS, BETA2SER, GAMS, MSPIKE, SPEI  No results found for: Nils Pyle, [facility]  Lab Results  Component Value Date   WBC 8.1 06/04/2021   NEUTROABS 5.2 06/04/2021   HGB 14.5 06/04/2021   HCT 47.3 (H) 06/04/2021   MCV 101.1 (H) 06/04/2021   PLT 242 06/04/2021      Chemistry      Component Value Date/Time   NA 141 03/20/2021 1119   NA 143 02/14/2020 1024   NA 137 02/27/2017 1007   K 4.6 03/20/2021 1119   K 4.4 02/27/2017 1007   CL 104 03/20/2021 1119   CL 105 04/08/2014 0355   CO2 27 03/20/2021 1119   CO2 32 (H) 02/27/2017 1007   BUN 19 03/20/2021 1119   BUN 17 02/14/2020 1024   BUN 7.7 02/27/2017 1007   CREATININE 1.64 (H) 03/20/2021 1119   CREATININE 2.02 (H) 04/27/2020 1136   CREATININE 0.9 02/27/2017 1007      Component Value Date/Time   CALCIUM 9.4 03/20/2021 1119   CALCIUM 10.2 02/27/2017 1007   ALKPHOS 85 03/20/2021 1119   ALKPHOS 78 02/27/2017 1007   AST 14 03/20/2021 1119   AST 14 (L) 04/27/2020 1136   AST 14 02/27/2017 1007   ALT 11 03/20/2021 1119   ALT 9 04/27/2020 1136   ALT 9 02/27/2017 1007   BILITOT 0.5 03/20/2021 1119   BILITOT 0.6 04/27/2020 1136   BILITOT 0.60 02/27/2017 1007     No results found for: LABCA2  No components found for: [id number]  No results for input(s): INR in the last 168 hours.  Urinalysis    Component Value Date/Time   COLORURINE YELLOW 10/17/2019 1429    APPEARANCEUR CLOUDY (A) 10/17/2019 1429   APPEARANCEUR Cloudy 04/07/2014 2005   LABSPEC 1.010 10/17/2019 1429   LABSPEC 1.006 04/07/2014 2005   PHURINE 6.0 10/17/2019 1429   GLUCOSEU NEGATIVE 10/17/2019 1429   GLUCOSEU Negative 04/07/2014 2005   HGBUR SMALL (A) 10/17/2019 1429   BILIRUBINUR neg 03/26/2020 1109   BILIRUBINUR Negative 04/07/2014 2005   KETONESUR NEGATIVE 10/17/2019 1429   PROTEINUR Negative 03/26/2020 1109   PROTEINUR NEGATIVE 10/17/2019 1429   UROBILINOGEN 2.0 (A) 03/26/2020 1109   NITRITE pos 03/26/2020 1109   NITRITE POSITIVE (A) 10/17/2019 1429   LEUKOCYTESUR Moderate (2+) (A) 03/26/2020 1109   LEUKOCYTESUR LARGE (A) 10/17/2019 1429   LEUKOCYTESUR 3+ 04/07/2014 2005    STUDIES: No results found.     ELIGIBLE FOR AVAILABLE RESEARCH PROTOCOL: no  ASSESSMENT: 69 y.o. Fernand Parkins, [place] woman Status post left breast upper outer quadrant biopsy 09/24/2016 of an area of architectural distortion measuring 3.7 cm, showing invasive ductal carcinoma, grade 1, estrogen and progesterone receptor positive, HER-2 nonamplified, with an MIB-1 of 5%.  (a) a 0.3 cm area of linear calcifications in the superior aspect of the breast Was benign on biopsy 10/02/2016   (1) left lumpectomy and sentinel lymph node sampling 11/19/2016 found ([id number]) invasive ductal carcinoma, measuring 1.9 cm, grade 2, with all 3 sentinel lymph nodes clear. However the medial margin was  involved  (a) additional surgery 12/12/2016 cleared the involved margin (as [id number]  (2) Oncotype DX  score of 14 predicted a 10 year risk of outside the breast recurrence of 9% if the patient's only systemic therapy is tamoxifen for 5 years. It also predicted no benefit from chemotherapy.  (3) adjuvant radiation 01/20/2017 to 03/02/2017  Site/dose:   The Left breast was treated to 50.4 Gy in 18 fractions of 1.8 Gy.   (4) anastrozole started April 2018   PLAN: Verdean is now 4-1/2 years out from definitive surgery  for her breast cancer with no evidence of disease recurrence.  This is very favorable.  She is tolerating anastrozole well and she will complete 5 years April 2023.  She will have her next mammogram in June and she will see [place] again in July 2023.  That likely will be her "graduation" visit.  Her hematocrit is climbing as a reflection of hypoxia.  She is not a smoker.I have encouraged her to do some in chair exercises.  Total encounter time 25 minutes.*   Kamori Kitchens, Daisy Dad, MD  06/04/21 2:12 PM Medical Oncology and Hematology [facility] [place], [place] [address] Tel. [phone]    Fax. [phone]    I, Wilburn Mylar, am acting as scribe for Dr. Virgie [name]. Samadhi Mahurin.  I, Lurline Del MD, have reviewed the above documentation for accuracy and completeness, and I agree with the above.   *Total Encounter Time as defined by the Centers for Medicare and Medicaid Services includes, in addition to the face-to-face time of a patient visit (documented in the note above) non-face-to-face time: obtaining and reviewing outside history, ordering and reviewing medications, tests or procedures, care coordination (communications with other health care professionals or caregivers) and documentation in the medical record.

## 2021-06-04 ENCOUNTER — Other Ambulatory Visit: Payer: Self-pay

## 2021-06-04 ENCOUNTER — Inpatient Hospital Stay: Payer: Medicare PPO | Attending: Oncology

## 2021-06-04 ENCOUNTER — Inpatient Hospital Stay: Payer: Medicare PPO | Admitting: Oncology

## 2021-06-04 VITALS — BP 135/58 | HR 83 | Temp 97.9°F | Resp 18 | Ht 66.0 in | Wt 334.2 lb

## 2021-06-04 DIAGNOSIS — C50412 Malignant neoplasm of upper-outer quadrant of left female breast: Secondary | ICD-10-CM

## 2021-06-04 DIAGNOSIS — Z170 Estrogen receptor positive status [ER+]: Secondary | ICD-10-CM

## 2021-06-04 DIAGNOSIS — Z87891 Personal history of nicotine dependence: Secondary | ICD-10-CM | POA: Diagnosis not present

## 2021-06-04 DIAGNOSIS — Z79811 Long term (current) use of aromatase inhibitors: Secondary | ICD-10-CM | POA: Insufficient documentation

## 2021-06-04 LAB — CBC WITH DIFFERENTIAL (CANCER CENTER ONLY)
Abs Immature Granulocytes: 0.02 10*3/uL (ref 0.00–0.07)
Basophils Absolute: 0 10*3/uL (ref 0.0–0.1)
Basophils Relative: 0 %
Eosinophils Absolute: 0.1 10*3/uL (ref 0.0–0.5)
Eosinophils Relative: 1 %
HCT: 47.3 % — ABNORMAL HIGH (ref 36.0–46.0)
Hemoglobin: 14.5 g/dL (ref 12.0–15.0)
Immature Granulocytes: 0 %
Lymphocytes Relative: 29 %
Lymphs Abs: 2.3 10*3/uL (ref 0.7–4.0)
MCH: 31 pg (ref 26.0–34.0)
MCHC: 30.7 g/dL (ref 30.0–36.0)
MCV: 101.1 fL — ABNORMAL HIGH (ref 80.0–100.0)
Monocytes Absolute: 0.4 10*3/uL (ref 0.1–1.0)
Monocytes Relative: 5 %
Neutro Abs: 5.2 10*3/uL (ref 1.7–7.7)
Neutrophils Relative %: 65 %
Platelet Count: 242 10*3/uL (ref 150–400)
RBC: 4.68 MIL/uL (ref 3.87–5.11)
RDW: 13.9 % (ref 11.5–15.5)
WBC Count: 8.1 10*3/uL (ref 4.0–10.5)
nRBC: 0 % (ref 0.0–0.2)

## 2021-06-04 LAB — CMP (CANCER CENTER ONLY)
ALT: 14 U/L (ref 0–44)
AST: 17 U/L (ref 15–41)
Albumin: 3.9 g/dL (ref 3.5–5.0)
Alkaline Phosphatase: 88 U/L (ref 38–126)
Anion gap: 8 (ref 5–15)
BUN: 24 mg/dL — ABNORMAL HIGH (ref 8–23)
CO2: 28 mmol/L (ref 22–32)
Calcium: 9.5 mg/dL (ref 8.9–10.3)
Chloride: 106 mmol/L (ref 98–111)
Creatinine: 1.72 mg/dL — ABNORMAL HIGH (ref 0.44–1.00)
GFR, Estimated: 32 mL/min — ABNORMAL LOW (ref >60)
Glucose, Bld: 96 mg/dL (ref 70–99)
Potassium: 4.5 mmol/L (ref 3.5–5.1)
Sodium: 142 mmol/L (ref 135–145)
Total Bilirubin: 0.6 mg/dL (ref 0.3–1.2)
Total Protein: 7.3 g/dL (ref 6.5–8.1)

## 2021-06-04 MED ORDER — ANASTROZOLE 1 MG PO TABS: 1.0000 mg | ORAL_TABLET | Freq: Every day | ORAL | 4 refills | Status: DC

## 2021-06-11 ENCOUNTER — Telehealth: Payer: Self-pay | Admitting: Oncology

## 2021-06-11 NOTE — Telephone Encounter (Signed)
Scheduled appointment per 12/20 los. Patient is aware.

## 2021-08-06 ENCOUNTER — Other Ambulatory Visit: Payer: Self-pay | Admitting: Family Medicine

## 2021-08-06 DIAGNOSIS — J449 Chronic obstructive pulmonary disease, unspecified: Secondary | ICD-10-CM

## 2021-08-06 DIAGNOSIS — F411 Generalized anxiety disorder: Secondary | ICD-10-CM

## 2021-08-06 DIAGNOSIS — F331 Major depressive disorder, recurrent, moderate: Secondary | ICD-10-CM

## 2021-09-18 ENCOUNTER — Ambulatory Visit: Payer: Medicare PPO | Admitting: Family Medicine

## 2021-09-25 ENCOUNTER — Ambulatory Visit: Payer: Medicare PPO | Admitting: Family Medicine

## 2021-10-02 DIAGNOSIS — H353231 Exudative age-related macular degeneration, bilateral, with active choroidal neovascularization: Secondary | ICD-10-CM | POA: Diagnosis not present

## 2021-10-17 ENCOUNTER — Ambulatory Visit: Payer: Medicare PPO | Admitting: Cardiology

## 2021-10-25 ENCOUNTER — Encounter (HOSPITAL_BASED_OUTPATIENT_CLINIC_OR_DEPARTMENT_OTHER): Payer: Self-pay

## 2021-10-25 ENCOUNTER — Telehealth: Payer: Self-pay | Admitting: Pulmonary Disease

## 2021-10-25 ENCOUNTER — Ambulatory Visit (INDEPENDENT_AMBULATORY_CARE_PROVIDER_SITE_OTHER): Payer: Medicare PPO

## 2021-10-25 ENCOUNTER — Encounter: Payer: Self-pay | Admitting: Nurse Practitioner

## 2021-10-25 ENCOUNTER — Ambulatory Visit: Payer: Medicare PPO | Admitting: Nurse Practitioner

## 2021-10-25 ENCOUNTER — Inpatient Hospital Stay (HOSPITAL_BASED_OUTPATIENT_CLINIC_OR_DEPARTMENT_OTHER)
Admission: EM | Admit: 2021-10-25 | Discharge: 2021-10-29 | DRG: 291 | Disposition: A | Discharge status: Home Health | Payer: Medicare PPO | Attending: Internal Medicine | Admitting: Internal Medicine

## 2021-10-25 ENCOUNTER — Other Ambulatory Visit: Payer: Self-pay

## 2021-10-25 VITALS — BP 130/68 | HR 70 | Temp 98.2°F | Ht 67.0 in | Wt 340.0 lb

## 2021-10-25 DIAGNOSIS — Z9981 Dependence on supplemental oxygen: Secondary | ICD-10-CM | POA: Diagnosis not present

## 2021-10-25 DIAGNOSIS — R0602 Shortness of breath: Secondary | ICD-10-CM | POA: Diagnosis present

## 2021-10-25 DIAGNOSIS — I130 Hypertensive heart and chronic kidney disease with heart failure and stage 1 through stage 4 chronic kidney disease, or unspecified chronic kidney disease: Principal | ICD-10-CM | POA: Diagnosis present

## 2021-10-25 DIAGNOSIS — J440 Chronic obstructive pulmonary disease with acute lower respiratory infection: Secondary | ICD-10-CM | POA: Diagnosis present

## 2021-10-25 DIAGNOSIS — I739 Peripheral vascular disease, unspecified: Secondary | ICD-10-CM | POA: Diagnosis present

## 2021-10-25 DIAGNOSIS — R7303 Prediabetes: Secondary | ICD-10-CM | POA: Diagnosis present

## 2021-10-25 DIAGNOSIS — J9622 Acute and chronic respiratory failure with hypercapnia: Secondary | ICD-10-CM | POA: Diagnosis present

## 2021-10-25 DIAGNOSIS — I5032 Chronic diastolic (congestive) heart failure: Secondary | ICD-10-CM

## 2021-10-25 DIAGNOSIS — Z20822 Contact with and (suspected) exposure to covid-19: Secondary | ICD-10-CM | POA: Diagnosis present

## 2021-10-25 DIAGNOSIS — J441 Chronic obstructive pulmonary disease with (acute) exacerbation: Secondary | ICD-10-CM | POA: Diagnosis present

## 2021-10-25 DIAGNOSIS — J189 Pneumonia, unspecified organism: Secondary | ICD-10-CM | POA: Diagnosis present

## 2021-10-25 DIAGNOSIS — Z170 Estrogen receptor positive status [ER+]: Secondary | ICD-10-CM | POA: Diagnosis not present

## 2021-10-25 DIAGNOSIS — E785 Hyperlipidemia, unspecified: Secondary | ICD-10-CM | POA: Diagnosis present

## 2021-10-25 DIAGNOSIS — F17210 Nicotine dependence, cigarettes, uncomplicated: Secondary | ICD-10-CM | POA: Diagnosis present

## 2021-10-25 DIAGNOSIS — N183 Chronic kidney disease, stage 3 unspecified: Secondary | ICD-10-CM | POA: Diagnosis present

## 2021-10-25 DIAGNOSIS — Z801 Family history of malignant neoplasm of trachea, bronchus and lung: Secondary | ICD-10-CM

## 2021-10-25 DIAGNOSIS — Z7982 Long term (current) use of aspirin: Secondary | ICD-10-CM

## 2021-10-25 DIAGNOSIS — M5136 Other intervertebral disc degeneration, lumbar region: Secondary | ICD-10-CM | POA: Diagnosis not present

## 2021-10-25 DIAGNOSIS — I509 Heart failure, unspecified: Secondary | ICD-10-CM | POA: Diagnosis not present

## 2021-10-25 DIAGNOSIS — F411 Generalized anxiety disorder: Secondary | ICD-10-CM | POA: Diagnosis present

## 2021-10-25 DIAGNOSIS — I110 Hypertensive heart disease with heart failure: Secondary | ICD-10-CM | POA: Diagnosis not present

## 2021-10-25 DIAGNOSIS — I5033 Acute on chronic diastolic (congestive) heart failure: Secondary | ICD-10-CM | POA: Diagnosis present

## 2021-10-25 DIAGNOSIS — J449 Chronic obstructive pulmonary disease, unspecified: Secondary | ICD-10-CM | POA: Diagnosis present

## 2021-10-25 DIAGNOSIS — J9601 Acute respiratory failure with hypoxia: Secondary | ICD-10-CM

## 2021-10-25 DIAGNOSIS — J90 Pleural effusion, not elsewhere classified: Secondary | ICD-10-CM

## 2021-10-25 DIAGNOSIS — I10 Essential (primary) hypertension: Secondary | ICD-10-CM | POA: Diagnosis present

## 2021-10-25 DIAGNOSIS — J9621 Acute and chronic respiratory failure with hypoxia: Secondary | ICD-10-CM | POA: Diagnosis present

## 2021-10-25 DIAGNOSIS — Z881 Allergy status to other antibiotic agents status: Secondary | ICD-10-CM

## 2021-10-25 DIAGNOSIS — Z7984 Long term (current) use of oral hypoglycemic drugs: Secondary | ICD-10-CM

## 2021-10-25 DIAGNOSIS — Z79899 Other long term (current) drug therapy: Secondary | ICD-10-CM

## 2021-10-25 DIAGNOSIS — Z8249 Family history of ischemic heart disease and other diseases of the circulatory system: Secondary | ICD-10-CM

## 2021-10-25 DIAGNOSIS — Z6841 Body Mass Index (BMI) 40.0 and over, adult: Secondary | ICD-10-CM

## 2021-10-25 DIAGNOSIS — R0902 Hypoxemia: Secondary | ICD-10-CM | POA: Diagnosis not present

## 2021-10-25 DIAGNOSIS — Z923 Personal history of irradiation: Secondary | ICD-10-CM

## 2021-10-25 DIAGNOSIS — M545 Low back pain, unspecified: Secondary | ICD-10-CM | POA: Diagnosis not present

## 2021-10-25 DIAGNOSIS — Z888 Allergy status to other drugs, medicaments and biological substances status: Secondary | ICD-10-CM

## 2021-10-25 DIAGNOSIS — J9600 Acute respiratory failure, unspecified whether with hypoxia or hypercapnia: Secondary | ICD-10-CM | POA: Diagnosis present

## 2021-10-25 DIAGNOSIS — N1832 Chronic kidney disease, stage 3b: Secondary | ICD-10-CM | POA: Diagnosis present

## 2021-10-25 DIAGNOSIS — E662 Morbid (severe) obesity with alveolar hypoventilation: Secondary | ICD-10-CM | POA: Diagnosis present

## 2021-10-25 DIAGNOSIS — J811 Chronic pulmonary edema: Secondary | ICD-10-CM | POA: Diagnosis not present

## 2021-10-25 DIAGNOSIS — I2781 Cor pulmonale (chronic): Secondary | ICD-10-CM | POA: Diagnosis present

## 2021-10-25 DIAGNOSIS — Z853 Personal history of malignant neoplasm of breast: Secondary | ICD-10-CM

## 2021-10-25 DIAGNOSIS — Z882 Allergy status to sulfonamides status: Secondary | ICD-10-CM

## 2021-10-25 DIAGNOSIS — M47816 Spondylosis without myelopathy or radiculopathy, lumbar region: Secondary | ICD-10-CM | POA: Diagnosis present

## 2021-10-25 DIAGNOSIS — J9602 Acute respiratory failure with hypercapnia: Secondary | ICD-10-CM | POA: Diagnosis not present

## 2021-10-25 LAB — CBC
HCT: 48.2 % — ABNORMAL HIGH (ref 36.0–46.0)
Hemoglobin: 14.4 g/dL (ref 12.0–15.0)
MCH: 30.4 pg (ref 26.0–34.0)
MCHC: 29.9 g/dL — ABNORMAL LOW (ref 30.0–36.0)
MCV: 101.9 fL — ABNORMAL HIGH (ref 80.0–100.0)
Platelets: 258 10*3/uL (ref 150–400)
RBC: 4.73 MIL/uL (ref 3.87–5.11)
RDW: 14.5 % (ref 11.5–15.5)
WBC: 10.8 10*3/uL — ABNORMAL HIGH (ref 4.0–10.5)
nRBC: 0 % (ref 0.0–0.2)

## 2021-10-25 LAB — BASIC METABOLIC PANEL
Anion gap: 9 (ref 5–15)
BUN: 19 mg/dL (ref 8–23)
CO2: 28 mmol/L (ref 22–32)
Calcium: 9.2 mg/dL (ref 8.9–10.3)
Chloride: 102 mmol/L (ref 98–111)
Creatinine, Ser: 1.85 mg/dL — ABNORMAL HIGH (ref 0.44–1.00)
GFR, Estimated: 29 mL/min — ABNORMAL LOW (ref >60)
Glucose, Bld: 131 mg/dL — ABNORMAL HIGH (ref 70–99)
Potassium: 4.1 mmol/L (ref 3.5–5.1)
Sodium: 139 mmol/L (ref 135–145)

## 2021-10-25 LAB — I-STAT VENOUS BLOOD GAS, ED
Acid-Base Excess: 1 mmol/L (ref 0.0–2.0)
Bicarbonate: 30.3 mmol/L — ABNORMAL HIGH (ref 20.0–28.0)
Calcium, Ion: 1.28 mmol/L (ref 1.15–1.40)
HCT: 47 % — ABNORMAL HIGH (ref 36.0–46.0)
Hemoglobin: 16 g/dL — ABNORMAL HIGH (ref 12.0–15.0)
O2 Saturation: 77 %
Patient temperature: 98.6
Potassium: 4.4 mmol/L (ref 3.5–5.1)
Sodium: 137 mmol/L (ref 135–145)
TCO2: 32 mmol/L (ref 22–32)
pCO2, Ven: 67.4 mmHg — ABNORMAL HIGH (ref 44–60)
pH, Ven: 7.261 (ref 7.25–7.43)
pO2, Ven: 50 mmHg — ABNORMAL HIGH (ref 32–45)

## 2021-10-25 LAB — RESP PANEL BY RT-PCR (FLU A&B, COVID) ARPGX2
Influenza A by PCR: NEGATIVE
Influenza B by PCR: NEGATIVE
SARS Coronavirus 2 by RT PCR: NEGATIVE

## 2021-10-25 LAB — BRAIN NATRIURETIC PEPTIDE: B Natriuretic Peptide: 187.5 pg/mL — ABNORMAL HIGH (ref 0.0–100.0)

## 2021-10-25 MED ORDER — SODIUM CHLORIDE 0.9 % IV SOLN
1.0000 g | Freq: Once | INTRAVENOUS | Status: AC
  Administered 2021-10-25: 1 g via INTRAVENOUS
  Filled 2021-10-25: qty 10

## 2021-10-25 MED ORDER — FUROSEMIDE 10 MG/ML IJ SOLN
40.0000 mg | Freq: Once | INTRAMUSCULAR | Status: AC
  Administered 2021-10-25: 40 mg via INTRAVENOUS
  Filled 2021-10-25: qty 4

## 2021-10-25 MED ORDER — SODIUM CHLORIDE 0.9 % IV SOLN
500.0000 mg | Freq: Once | INTRAVENOUS | Status: AC
  Administered 2021-10-25: 500 mg via INTRAVENOUS
  Filled 2021-10-25 (×2): qty 5

## 2021-10-25 MED ORDER — ACETAMINOPHEN 500 MG PO TABS
1000.0000 mg | ORAL_TABLET | Freq: Once | ORAL | Status: AC
  Administered 2021-10-25: 1000 mg via ORAL
  Filled 2021-10-25: qty 2

## 2021-10-25 NOTE — ED Notes (Addendum)
RT processed VBG on pt with the following results. MD aware of all results and criticals from VBG. Pt also educated at this time about smoking cessation and compliance with her trilogy while sleeping as instructed by pulmonology. Pt verbalizes understanding of information. Pt respiratory status stable at this time w/no distress noted at this time. RT will continue to monitor.  ? ? Latest Reference Range & Units 10/25/21 19:42  ?Sample type  VENOUS  ?pH, Ven 7.25 - 7.43  7.261  ?pCO2, Ven 44 - 60 mmHg 67.4 (H)  ?pO2, Ven 32 - 45 mmHg 50 (H)  ?TCO2 22 - 32 mmol/L 32  ?Acid-Base Excess 0.0 - 2.0 mmol/L 1.0  ?Bicarbonate 20.0 - 28.0 mmol/L 30.3 (H)  ?O2 Saturation % 77  ?Patient temperature  98.6 F  ?Collection site  IV start  ? ?

## 2021-10-25 NOTE — Progress Notes (Signed)
? ?@Patient  ID: Daisy [name], female    DOB: 1950-12-26, 71 y.o.   MRN: [id number] ? ?Chief Complaint  ?Patient presents with  ? Acute Visit  ?  Pt is here due to low oxygen. Family states that Dr [name] took her off of oxygen last year. But they still and all the oxygen at home. They state this morning that they had to place her on 2L and only got her up to 81%. Back been hurting for a few days. Chest cold noted for a few weeks!   ? ? ?Referring provider: ?Lesleigh Noe, MD ? ?HPI: ?71 year old female, active smoker followed for obesity hypoventilation syndrome on BiPAP and acute on chronic respiratory failure, not on supplemental O2.  She is a patient Dr. Bari Mantis and last seen in office 02/05/2021.  Past medical history significant for hypertension, CHF, GERD, CKD, breast cancer stage Ib, morbid obesity, prediabetes, HLD, MDD, GAD. ? ?TEST/EVENTS:  ?10/18/2019 echocardiogram: EF 60 to 65%.  Diastolic parameters indeterminate.  RV size and function is normal.  There is trivial MR. ?12/31/2019 split-night: Total AHI 1.8/h; REM AHI 13.2/h, SPO2 low 82% ?02/09/2020 PFTs: FVC 72, FEV1 72, ratio 79, DLCO corrected for alveolar volume 96%.  No significant BD. ? ?02/05/2021: OV with Dr. [name]. Previous PFTs with restriction; strongly advised weight loss. Compliant with Trilogy vent at night but it is costly. Transitioned to BiPAP therapy EPAP of 5, IPAP 20, pressure support 10. Advised to follow up in one month to evaluate effectiveness. She was taken off oxygen some time before; sats stable in the 90's on room air.  ? ?10/25/2021: Today - acute visit ?Patient presents today with daughters for increased shortness of breath and low oxygen levels.  Family reports that she was taken off oxygen sometime last year.  Has not had to use it but they still had it available at home.  Last week, she developed a chest cold with cough.  Reports that sputum was clear and minimal.  She woke up this morning and had increasing shortness of  breath.  They checked her oxygen levels and noted that she was 81%.  They placed her on 2 L and were able to get her up into the low 90s.  They feel like she has not been as sharp as she normally is.  Feel like she may be getting a little more confused.  She does not feel like she has any increased welling in her lower legs but her daughters feel like she is a little more swollen than she normally is.  Also just feeling like her chest is heavy.  She is having some lower back pain as well which is worse than usual.  Denies any fevers, hemoptysis, palpitations, chest pain.  She continues to wear her BiPAP most nights.  Did have a few days where she was not wearing it.  She takes torsemide 10 mg daily.  They feel like she has not been peeing as much as she normally does during the day.  Do report episodes of nocturia. Weight is stable. ? ?Allergies  ?Allergen Reactions  ? Elemental Sulfur Hives  ? Levaquin [Levofloxacin] Other (See Comments)  ?  Body aches  ? Statins   ? ? ?Immunization History  ?Administered Date(s) Administered  ? Fluad Quad(high Dose 65+) 03/26/2020, 03/20/2021  ? PFIZER(Purple Top)SARS-COV-2 Vaccination 08/12/2019, 09/07/2019, 06/05/2020  ? Pneumococcal Polysaccharide-23 05/23/2020  ? Tdap 05/25/2011  ? ? ?Past Medical History:  ?Diagnosis Date  ? Acid reflux   ?  Acute heart failure ([place]) 10/17/2019  ? AKI (acute kidney injury) ([place]) 10/17/2019  ? Arthritis   ? Breast cancer of upper-outer quadrant of right female breast ([place]) 11/19/2016  ? Chicken pox   ? Cholecystitis   ? Chronic diastolic heart failure ([place]) 01/04/2020  ? CKD (chronic kidney disease), stage III ([place]) 01/04/2020  ? Depression   ? Dyspnea   ? History of radiation therapy 01/20/17-03/02/17  ? left breast was treated to 50.4 Gy in 18 fractions  ? HLD (hyperlipidemia) 01/04/2020  ? Hyperlipemia   ? Irregular heart rate   ? Malignant neoplasm of upper-outer quadrant of left breast in female, estrogen receptor positive ([place]) 09/30/2016  ? Morbid  obesity with BMI of 50.0-59.9, adult ([place]) 01/04/2020  ? Obesity   ? Peripheral vascular disease ([place])   ? Pneumonia   ? Prediabetes 01/04/2020  ? Urinary tract infection   ? Urinary tract infection with hematuria   ? ? ?Tobacco History: ?Social History  ? ?Tobacco Use  ?Smoking Status Former  ? Packs/day: 1.00  ? Years: 30.00  ? Pack years: 30.00  ? Types: E-cigarettes, Cigarettes  ? Quit date: 11/02/2019  ? Years since quitting: 1.9  ?Smokeless Tobacco Never  ? ?Counseling given: Not Answered ? ? ?Outpatient Medications Prior to Visit  ?Medication Sig Dispense Refill  ? acetaminophen (TYLENOL) 325 MG tablet Take 2 tablets (650 mg total) by mouth every 4 (four) hours as needed for headache or mild pain.    ? albuterol (VENTOLIN HFA) 108 (90 Base) MCG/ACT inhaler TAKE 2 PUFFS BY MOUTH EVERY 6 HOURS AS NEEDED FOR WHEEZE OR SHORTNESS OF BREATH 17 each 3  ? aspirin 81 MG chewable tablet Chew 81 mg by mouth daily.    ? cilostazol (PLETAL) 100 MG tablet TAKE 1 TABLET BY MOUTH TWICE A DAY 180 tablet 2  ? dapagliflozin propanediol (FARXIGA) 5 MG TABS tablet Take by mouth daily.    ? FLUoxetine (PROZAC) 20 MG capsule TAKE 1 CAPSULE BY MOUTH EVERY DAY 90 capsule 0  ? KLOR-CON M20 20 MEQ tablet TAKE 1 TABLET BY MOUTH EVERY DAY 90 tablet 1  ? losartan (COZAAR) 25 MG tablet Take 1 tablet (25 mg total) by mouth daily. 90 tablet 3  ? metoprolol succinate (TOPROL-XL) 25 MG 24 hr tablet Take 0.5 tablets (12.5 mg total) by mouth daily. 90 tablet 3  ? NEXLIZET 180-10 MG TABS TAKE 1 TABLET BY MOUTH EVERY DAY 90 tablet 3  ? pantoprazole (PROTONIX) 20 MG tablet Take 1 tablet (20 mg total) by mouth daily. 90 tablet 3  ? torsemide (DEMADEX) 10 MG tablet Take 1 tablet (10 mg total) by mouth 2 (two) times daily. 180 tablet 3  ? vitamin B-12 (CYANOCOBALAMIN) 500 MCG tablet Take by mouth.    ? anastrozole (ARIMIDEX) 1 MG tablet Take 1 tablet (1 mg total) by mouth daily. 90 tablet 4  ? ?No facility-administered medications prior to visit.   ? ? ? ?Review of Systems:  ? ?Constitutional: No weight loss or gain, night sweats, fevers, chills +fatigue, lassitude. ?HEENT: No headaches, difficulty swallowing, tooth/dental problems, or sore throat. No sneezing, itching, ear ache, nasal congestion, or post nasal drip ?CV:  +swelling in lower extremities; orthopnea. No chest pain,  anasarca, dizziness, palpitations, syncope ?Resp: +shortness of breath with exertion; new O2 requirement; minimal cough with clear sputum. No excess mucus or change in color of mucus.  No hemoptysis. No wheezing.  No chest wall deformity ?GU: +nocturia; decreased urination during  the day. No dysuria, change in color of urine, urgency or frequency.  No flank pain, no hematuria  ?Skin: No rash, lesions, ulcerations ?MSK:  +lower back pain. No joint pain or swelling.  No decreased range of motion.  ?Neuro: No dizziness or lightheadedness. +cognitive changes ?Psych: No depression or anxiety. Mood stable.  ? ? ? ?Physical Exam: ? ?BP 130/68 (BP Location: Left Arm, Patient Position: Sitting, Cuff Size: Normal)   Pulse 70   Temp 98.2 ?F (36.8 ?C) (Oral)   Ht 5\' 7"  (1.702 m)   Wt (!) 340 lb (154.2 kg)   SpO2 91%   BMI 53.25 kg/m?  ? ?GEN: Pleasant, interactive, chronically-ill appearing; morbidly obese; in no acute distress. ?HEENT:  Normocephalic and atraumatic. PERRLA. Sclera white. Nasal turbinates pink, moist and patent bilaterally. No rhinorrhea present. Oropharynx pink and moist, without exudate or edema. No lesions, ulcerations, or postnasal drip.  ?NECK:  Supple w/ fair ROM. No JVD present. Normal carotid impulses w/o bruits. Thyroid symmetrical with no goiter or nodules palpated. No lymphadenopathy.   ?CV: RRR, no m/r/g, no peripheral edema. Pulses intact, +2 bilaterally. No cyanosis, pallor or clubbing. ?PULMONARY:  Unlabored, regular breathing. Diminished bilaterally posteriorly L>R w/o wheezes/rales/rhonchi. No accessory muscle use. ?GI: BS present and normoactive. Soft,  non-tender to palpation.  ?Neuro: A/Ox3. No focal deficits noted.   ?Skin: Warm, no lesions or rashe ?Psych: Normal affect and behavior. Judgement and thought content appropriate.  ? ? ? ?Lab Results: ? ?CBC ?   ?Comp

## 2021-10-25 NOTE — ED Provider Notes (Signed)
?[facility] EMERGENCY DEPT ?Provider Note ? ? ?CSN: [id number] ?Arrival date & time: 10/25/21  1642 ? ?  ? ?History ? ?Chief Complaint  ?Patient presents with  ? Shortness of Breath  ? ? ?Daisy [name] is a 71 y.o. female. ? ? ?Shortness of Breath ?Patient presents with shortness of breath.  Has had for the last few days.  Comes from pulmonary office.  Sats down into the 80s at home.  Not on oxygen at baseline although has not for at night for her CPAP.  History of obesity hypoventilation.  Has had a cough with some sputum production.  Chest x-ray shows potential edema and pneumonia.  Has had decreased activity due to it.  States she feels if her weight is doing very well.  Is on torsemide at home. ?  ? ?Home Medications ?Prior to Admission medications   ?Medication Sig Start Date End Date Taking? Authorizing Provider  ?acetaminophen (TYLENOL) 325 MG tablet Take 2 tablets (650 mg total) by mouth every 4 (four) hours as needed for headache or mild pain. 11/02/19   Guilford Shi, MD  ?albuterol (VENTOLIN HFA) 108 (90 Base) MCG/ACT inhaler TAKE 2 PUFFS BY MOUTH EVERY 6 HOURS AS NEEDED FOR WHEEZE OR SHORTNESS OF BREATH 08/06/21   Lesleigh Noe, MD  ?aspirin 81 MG chewable tablet Chew 81 mg by mouth daily.    [provider]  ?cilostazol (PLETAL) 100 MG tablet TAKE 1 TABLET BY MOUTH TWICE A DAY 05/22/21   Adrian Prows, MD  ?dapagliflozin propanediol (FARXIGA) 5 MG TABS tablet Take by mouth daily.    [provider]  ?FLUoxetine (PROZAC) 20 MG capsule TAKE 1 CAPSULE BY MOUTH EVERY DAY 08/06/21   Lesleigh Noe, MD  ?KLOR-CON M20 20 MEQ tablet TAKE 1 TABLET BY MOUTH EVERY DAY 05/23/21   Lesleigh Noe, MD  ?losartan (COZAAR) 25 MG tablet Take 1 tablet (25 mg total) by mouth daily. 03/20/21 03/20/22  Lesleigh Noe, MD  ?metoprolol succinate (TOPROL-XL) 25 MG 24 hr tablet Take 0.5 tablets (12.5 mg total) by mouth daily. 03/20/21   Lesleigh Noe, MD  ?NEXLIZET 180-10 MG TABS TAKE 1  TABLET BY MOUTH EVERY DAY 04/08/21   Adrian Prows, MD  ?pantoprazole (PROTONIX) 20 MG tablet Take 1 tablet (20 mg total) by mouth daily. 03/20/21   Lesleigh Noe, MD  ?torsemide (DEMADEX) 10 MG tablet Take 1 tablet (10 mg total) by mouth 2 (two) times daily. 03/20/21   Lesleigh Noe, MD  ?vitamin B-12 (CYANOCOBALAMIN) 500 MCG tablet Take by mouth.    [provider]  ?   ? ?Allergies    ?Elemental sulfur, Levaquin [levofloxacin], and Statins   ? ?Review of Systems   ?Review of Systems  ?Respiratory:  Positive for shortness of breath.   ? ?Physical Exam ?Updated Vital Signs ?BP (!) 141/67   Pulse 90   Temp 98.6 ?F (37 ?C) (Temporal)   Resp (!) 26   Ht 5\' 7"  (1.702 m)   Wt (!) 154.2 kg   SpO2 91%   BMI 53.25 kg/m?  ?Physical Exam ?Vitals and nursing note reviewed.  ?Constitutional:   ?   Appearance: She is obese.  ?Cardiovascular:  ?   Rate and Rhythm: Normal rate.  ?Pulmonary:  ?   Breath sounds: No wheezing.  ?   Comments: Mildly harsh breath sounds bilaterally. ?Chest:  ?   Chest wall: No tenderness.  ?Musculoskeletal:  ?   Right lower leg: Edema  present.  ?   Left lower leg: Edema present.  ?   Comments: Some edema bilateral lower extremities.  ?Neurological:  ?   Mental Status: She is alert.  ? ? ?ED Results / Procedures / Treatments   ?Labs ?(all labs ordered are listed, but only abnormal results are displayed) ?Labs Reviewed  ?BASIC METABOLIC PANEL - Abnormal; Notable for the following components:  ?    Result Value  ? Glucose, Bld 131 (*)   ? Creatinine, Ser 1.85 (*)   ? GFR, Estimated 29 (*)   ? All other components within normal limits  ?CBC - Abnormal; Notable for the following components:  ? WBC 10.8 (*)   ? HCT 48.2 (*)   ? MCV 101.9 (*)   ? MCHC 29.9 (*)   ? All other components within normal limits  ?BRAIN NATRIURETIC PEPTIDE - Abnormal; Notable for the following components:  ? B Natriuretic Peptide 187.5 (*)   ? All other components within normal limits  ?I-STAT VENOUS BLOOD GAS, ED -  Abnormal; Notable for the following components:  ? pCO2, Ven 67.4 (*)   ? pO2, Ven 50 (*)   ? Bicarbonate 30.3 (*)   ? HCT 47.0 (*)   ? Hemoglobin 16.0 (*)   ? All other components within normal limits  ?RESP PANEL BY RT-PCR (FLU A&B, COVID) [id number]  ? ? ?EKG ?EKG Interpretation ? ?Date/Time:  Friday Oct 25 2021 17:09:48 EDT ?Ventricular Rate:  92 ?PR Interval:  144 ?QRS Duration: 78 ?QT Interval:  380 ?QTC Calculation: 469 ?R Axis:   9 ?Text Interpretation: Sinus rhythm with Premature atrial complexes Low voltage QRS Cannot rule out Anterior infarct , age undetermined Abnormal ECG When compared with ECG of 19-Nov-2016 08:59, Premature atrial complexes are now Present Minimal criteria for Anterior infarct are now Present T wave inversion now evident in Inferior leads T wave inversion now evident in Anterior leads Confirmed by Davonna Belling [phone]) on 10/25/2021 6:53:25 PM ? ?Radiology ?DG Chest 2 View ? ?Result Date: 10/25/2021 ?CLINICAL DATA:  Shortness of breath, hypoxia, chest congestion EXAM: CHEST - 2 VIEW COMPARISON:  10/24/2019 FINDINGS: Enlargement of cardiac silhouette with pulmonary vascular congestion. Mediastinal contours normal with atherosclerotic calcifications aorta. LEFT pleural effusion and basilar atelectasis versus consolidation. Remaining lungs clear. No pneumothorax or acute osseous findings. IMPRESSION: Enlargement of cardiac silhouette with pulmonary vascular congestion. LEFT basilar opacity in consistent with LEFT pleural effusion and associated atelectasis versus consolidation LEFT lower lobe. Aortic Atherosclerosis (ICD10-I70.0). Electronically Signed   By: Lavonia Dana M.D.   On: 10/25/2021 15:00   ? ?Procedures ?Procedures  ? ? ?Medications Ordered in ED ?Medications  ?acetaminophen (TYLENOL) tablet 1,000 mg (1,000 mg Oral Given 10/25/21 1718)  ?cefTRIAXone (ROCEPHIN) 1 g in sodium chloride 0.9 % 100 mL IVPB (0 g Intravenous Stopped 10/25/21 2055)  ?azithromycin (ZITHROMAX) 500 mg in  sodium chloride 0.9 % 250 mL IVPB (500 mg Intravenous New Bag/Given 10/25/21 2125)  ?furosemide (LASIX) injection 40 mg (40 mg Intravenous Given 10/25/21 2125)  ? ? ?ED Course/ Medical Decision Making/ A&P ?  ?                        ?Medical Decision Making ?Amount and/or Complexity of Data Reviewed ?Labs: ordered. ? ?Risk ?OTC drugs. ?Prescription drug management. ?Decision regarding hospitalization. ? ? ?Patient with shortness of breath and hypoxia.  Cough.  Requiring oxygen.  Reviewed note from pulmonary office this.  Pneumonia versus CHF  or potentially component of both.  Will require admission the hospital.  On 2 L which she is not normally on and sats will be right about 90%.  We will give some Lasix since she is carrying fluid and BNP is somewhat up.  Will require admission to the hospital.  Antibiotics given potential pneumonia since has been coughing and bringing up more sputum. ? ? ? ? ? ? ? ? ? ?Final Clinical Impression(s) / ED Diagnoses ?Final diagnoses:  ?Acute hypoxemic respiratory failure ([place])  ?Acute on chronic congestive heart failure, unspecified heart failure type ([place])  ?Community acquired pneumonia, unspecified laterality  ? ? ?Rx / DC Orders ?ED Discharge Orders   ? ? None  ? ?  ? ? ?  ?Davonna Belling, MD ?10/25/21 2349 ? ?

## 2021-10-25 NOTE — Patient Instructions (Addendum)
Recommend further evaluation at the ED given your new oxygen requirements, shortness of breath, pleural effusion and pulmonary edema  ? ?Please call to schedule a follow up with our office after you are discharged.  ?

## 2021-10-25 NOTE — Telephone Encounter (Signed)
This patient has a follow up on 10/25/2021 with Roxan Diesel, NP.  ?

## 2021-10-25 NOTE — Assessment & Plan Note (Addendum)
New oxygen requirement. Able to maintain saturations in the 90's. CXR questionable for pneumonia vs atelectasis. Discussed risks vs benefits of treating her outpatient vs having her go to the hospital. Given her new oxygen requirement, increasing SOB, new pulmonary edema/pleural effusion, and that we are heading into the weekend, we agreed the safest option would be for her to be further evaluated in the ED with IV diuresis and labs. Emergency transport offered and declined; pt will go via private vehicle.  ?

## 2021-10-25 NOTE — ED Triage Notes (Signed)
Pt started having ShOB last PM. Pt had an apnea alarm on Bi-Pap overnight and SpO2 in the 70s. Pt saw pulmonology today and had a CXR which showed a pleural effusion and pulmonary edema. Pt was instructed to go to the ED.   ?

## 2021-10-25 NOTE — Plan of Care (Signed)
[facility] will assume care on arrival to accepting facility. Until arrival, care as per EDP. However, [facility] available 24/7 for questions and assistance.  Nursing staff, please page [facility] Admits and Consults ([phone]) as soon as the patient arrives to the hospital.   

## 2021-10-25 NOTE — Assessment & Plan Note (Addendum)
Increased left sided pleural effusion with vascular congestion and cardiomegaly. Suspect she is having an acute on chronic CHF exacerbation. She is also at risk for pulmonary HTN given her hx of OSA/OHS; last echo 2 years ago and did not measure PASP. Further eval in ED appropriate given limited options for workup/treatment in an outpatient setting.  ?

## 2021-10-25 NOTE — Assessment & Plan Note (Addendum)
No evidence of BLE edema on exam. Currently only taking 10 mg of torsemide daily. Will need further evaluation with labs and possible echo. ?

## 2021-10-26 ENCOUNTER — Inpatient Hospital Stay (HOSPITAL_COMMUNITY): Payer: Medicare PPO

## 2021-10-26 DIAGNOSIS — J189 Pneumonia, unspecified organism: Secondary | ICD-10-CM | POA: Diagnosis present

## 2021-10-26 DIAGNOSIS — I5033 Acute on chronic diastolic (congestive) heart failure: Secondary | ICD-10-CM

## 2021-10-26 DIAGNOSIS — J441 Chronic obstructive pulmonary disease with (acute) exacerbation: Secondary | ICD-10-CM | POA: Diagnosis present

## 2021-10-26 DIAGNOSIS — J9622 Acute and chronic respiratory failure with hypercapnia: Secondary | ICD-10-CM | POA: Diagnosis present

## 2021-10-26 DIAGNOSIS — Z7984 Long term (current) use of oral hypoglycemic drugs: Secondary | ICD-10-CM | POA: Diagnosis not present

## 2021-10-26 DIAGNOSIS — J9621 Acute and chronic respiratory failure with hypoxia: Secondary | ICD-10-CM | POA: Diagnosis present

## 2021-10-26 DIAGNOSIS — F411 Generalized anxiety disorder: Secondary | ICD-10-CM | POA: Diagnosis present

## 2021-10-26 DIAGNOSIS — J440 Chronic obstructive pulmonary disease with acute lower respiratory infection: Secondary | ICD-10-CM | POA: Diagnosis present

## 2021-10-26 DIAGNOSIS — E662 Morbid (severe) obesity with alveolar hypoventilation: Secondary | ICD-10-CM | POA: Diagnosis present

## 2021-10-26 DIAGNOSIS — Z801 Family history of malignant neoplasm of trachea, bronchus and lung: Secondary | ICD-10-CM | POA: Diagnosis not present

## 2021-10-26 DIAGNOSIS — Z7982 Long term (current) use of aspirin: Secondary | ICD-10-CM | POA: Diagnosis not present

## 2021-10-26 DIAGNOSIS — Z9981 Dependence on supplemental oxygen: Secondary | ICD-10-CM | POA: Diagnosis not present

## 2021-10-26 DIAGNOSIS — I509 Heart failure, unspecified: Secondary | ICD-10-CM | POA: Diagnosis not present

## 2021-10-26 DIAGNOSIS — E785 Hyperlipidemia, unspecified: Secondary | ICD-10-CM | POA: Diagnosis present

## 2021-10-26 DIAGNOSIS — M47816 Spondylosis without myelopathy or radiculopathy, lumbar region: Secondary | ICD-10-CM | POA: Diagnosis present

## 2021-10-26 DIAGNOSIS — F17210 Nicotine dependence, cigarettes, uncomplicated: Secondary | ICD-10-CM | POA: Diagnosis present

## 2021-10-26 DIAGNOSIS — Z6841 Body Mass Index (BMI) 40.0 and over, adult: Secondary | ICD-10-CM | POA: Diagnosis not present

## 2021-10-26 DIAGNOSIS — J9602 Acute respiratory failure with hypercapnia: Secondary | ICD-10-CM | POA: Diagnosis not present

## 2021-10-26 DIAGNOSIS — I739 Peripheral vascular disease, unspecified: Secondary | ICD-10-CM | POA: Diagnosis present

## 2021-10-26 DIAGNOSIS — I2781 Cor pulmonale (chronic): Secondary | ICD-10-CM | POA: Diagnosis present

## 2021-10-26 DIAGNOSIS — I130 Hypertensive heart and chronic kidney disease with heart failure and stage 1 through stage 4 chronic kidney disease, or unspecified chronic kidney disease: Secondary | ICD-10-CM | POA: Diagnosis present

## 2021-10-26 DIAGNOSIS — N1832 Chronic kidney disease, stage 3b: Secondary | ICD-10-CM | POA: Diagnosis present

## 2021-10-26 DIAGNOSIS — R0602 Shortness of breath: Secondary | ICD-10-CM | POA: Diagnosis present

## 2021-10-26 DIAGNOSIS — R7303 Prediabetes: Secondary | ICD-10-CM | POA: Diagnosis present

## 2021-10-26 DIAGNOSIS — Z20822 Contact with and (suspected) exposure to covid-19: Secondary | ICD-10-CM | POA: Diagnosis present

## 2021-10-26 DIAGNOSIS — Z8249 Family history of ischemic heart disease and other diseases of the circulatory system: Secondary | ICD-10-CM | POA: Diagnosis not present

## 2021-10-26 DIAGNOSIS — J9601 Acute respiratory failure with hypoxia: Principal | ICD-10-CM

## 2021-10-26 DIAGNOSIS — Z170 Estrogen receptor positive status [ER+]: Secondary | ICD-10-CM | POA: Diagnosis not present

## 2021-10-26 LAB — PROCALCITONIN: Procalcitonin: 12.71 ng/mL

## 2021-10-26 LAB — MAGNESIUM: Magnesium: 1.9 mg/dL (ref 1.7–2.4)

## 2021-10-26 MED ORDER — ASPIRIN 81 MG PO CHEW
81.0000 mg | CHEWABLE_TABLET | Freq: Every day | ORAL | Status: DC
  Administered 2021-10-26 – 2021-10-29 (×4): 81 mg via ORAL
  Filled 2021-10-26 (×4): qty 1

## 2021-10-26 MED ORDER — PANTOPRAZOLE SODIUM 40 MG PO TBEC
40.0000 mg | DELAYED_RELEASE_TABLET | Freq: Every day | ORAL | Status: DC
  Administered 2021-10-26 – 2021-10-29 (×4): 40 mg via ORAL
  Filled 2021-10-26 (×4): qty 1

## 2021-10-26 MED ORDER — POTASSIUM CHLORIDE CRYS ER 20 MEQ PO TBCR
20.0000 meq | EXTENDED_RELEASE_TABLET | Freq: Every day | ORAL | Status: DC
  Administered 2021-10-26 – 2021-10-27 (×2): 20 meq via ORAL
  Filled 2021-10-26 (×2): qty 1

## 2021-10-26 MED ORDER — BEMPEDOIC ACID-EZETIMIBE 180-10 MG PO TABS: 1.0000 | ORAL_TABLET | Freq: Every day | ORAL | Status: DC

## 2021-10-26 MED ORDER — ACETAMINOPHEN 325 MG PO TABS: 650.0000 mg | ORAL_TABLET | Freq: Four times a day (QID) | ORAL | Status: DC | PRN

## 2021-10-26 MED ORDER — FUROSEMIDE 10 MG/ML IJ SOLN
40.0000 mg | Freq: Once | INTRAMUSCULAR | Status: AC
  Administered 2021-10-26: 40 mg via INTRAVENOUS
  Filled 2021-10-26: qty 4

## 2021-10-26 MED ORDER — FUROSEMIDE 10 MG/ML IJ SOLN
40.0000 mg | Freq: Once | INTRAMUSCULAR | Status: DC
  Filled 2021-10-26: qty 4

## 2021-10-26 MED ORDER — HYDROCODONE-ACETAMINOPHEN 5-325 MG PO TABS: 1.0000 | ORAL_TABLET | ORAL | Status: DC | PRN

## 2021-10-26 MED ORDER — FLUOXETINE HCL 20 MG PO CAPS
20.0000 mg | ORAL_CAPSULE | Freq: Every day | ORAL | Status: DC
  Administered 2021-10-26 – 2021-10-29 (×4): 20 mg via ORAL
  Filled 2021-10-26: qty 1
  Filled 2021-10-26: qty 2
  Filled 2021-10-26 (×2): qty 1

## 2021-10-26 MED ORDER — ALBUTEROL SULFATE HFA 108 (90 BASE) MCG/ACT IN AERS: 2.0000 | INHALATION_SPRAY | RESPIRATORY_TRACT | Status: DC | PRN

## 2021-10-26 MED ORDER — CILOSTAZOL 100 MG PO TABS
100.0000 mg | ORAL_TABLET | Freq: Two times a day (BID) | ORAL | Status: DC
  Administered 2021-10-26 – 2021-10-29 (×6): 100 mg via ORAL
  Filled 2021-10-26 (×7): qty 1

## 2021-10-26 MED ORDER — METOPROLOL SUCCINATE ER 25 MG PO TB24
12.5000 mg | ORAL_TABLET | Freq: Every day | ORAL | Status: DC
  Administered 2021-10-27 – 2021-10-29 (×3): 12.5 mg via ORAL
  Filled 2021-10-26 (×3): qty 1

## 2021-10-26 MED ORDER — IPRATROPIUM-ALBUTEROL 0.5-2.5 (3) MG/3ML IN SOLN
3.0000 mL | Freq: Four times a day (QID) | RESPIRATORY_TRACT | Status: AC
  Administered 2021-10-26: 3 mL via RESPIRATORY_TRACT
  Filled 2021-10-26: qty 3

## 2021-10-26 MED ORDER — ENOXAPARIN SODIUM 80 MG/0.8ML IJ SOSY
75.0000 mg | PREFILLED_SYRINGE | INTRAMUSCULAR | Status: DC
  Administered 2021-10-26 – 2021-10-28 (×3): 75 mg via SUBCUTANEOUS
  Filled 2021-10-26 (×3): qty 0.8

## 2021-10-26 MED ORDER — TORSEMIDE 10 MG PO TABS
10.0000 mg | ORAL_TABLET | Freq: Every day | ORAL | Status: DC
  Filled 2021-10-26: qty 1

## 2021-10-26 MED ORDER — SODIUM CHLORIDE 0.9 % IV SOLN
500.0000 mg | INTRAVENOUS | Status: DC
  Administered 2021-10-26: 500 mg via INTRAVENOUS
  Filled 2021-10-26: qty 5

## 2021-10-26 MED ORDER — SODIUM CHLORIDE 0.9 % IV SOLN: 250.0000 mL | INTRAVENOUS | Status: DC | PRN

## 2021-10-26 MED ORDER — SODIUM CHLORIDE 0.9% FLUSH
3.0000 mL | INTRAVENOUS | Status: DC | PRN
  Administered 2021-10-26: 3 mL via INTRAVENOUS

## 2021-10-26 MED ORDER — LOSARTAN POTASSIUM 50 MG PO TABS
25.0000 mg | ORAL_TABLET | Freq: Every day | ORAL | Status: DC
Start: 2021-10-26 — End: 2021-10-27
  Administered 2021-10-26: 25 mg via ORAL
  Filled 2021-10-26: qty 1

## 2021-10-26 MED ORDER — SODIUM CHLORIDE 0.9 % IV SOLN
1.0000 g | INTRAVENOUS | Status: DC
  Administered 2021-10-26 – 2021-10-28 (×3): 1 g via INTRAVENOUS
  Filled 2021-10-26 (×3): qty 10

## 2021-10-26 MED ORDER — SODIUM CHLORIDE 0.9% FLUSH
3.0000 mL | Freq: Two times a day (BID) | INTRAVENOUS | Status: DC
  Administered 2021-10-26 – 2021-10-29 (×5): 3 mL via INTRAVENOUS

## 2021-10-26 MED ORDER — ACETAMINOPHEN 650 MG RE SUPP: 650.0000 mg | Freq: Four times a day (QID) | RECTAL | Status: DC | PRN

## 2021-10-26 MED ORDER — DAPAGLIFLOZIN PROPANEDIOL 5 MG PO TABS
5.0000 mg | ORAL_TABLET | Freq: Every day | ORAL | Status: DC
  Administered 2021-10-27 – 2021-10-29 (×3): 5 mg via ORAL
  Filled 2021-10-26 (×4): qty 1

## 2021-10-26 NOTE — Assessment & Plan Note (Addendum)
Continue nexlizet  ?

## 2021-10-26 NOTE — Assessment & Plan Note (Signed)
She has occasional quiet expiratory wheeze, moving air well ?Will schedule duonebs, continue SABA  ?If worsening wheezing will add on steroids, but do not feel like she is in exacerbation  ?

## 2021-10-26 NOTE — Assessment & Plan Note (Signed)
Unsure if contributing to her symptoms, ?Continue bipap at night if no trilogy offered at hospital.  ?

## 2021-10-26 NOTE — Progress Notes (Signed)
Returned from XR IVPB ATB resumed ?

## 2021-10-26 NOTE — Assessment & Plan Note (Signed)
Well controlled  ?Continue toprol-xl  ?

## 2021-10-26 NOTE — Assessment & Plan Note (Signed)
Continue prozac. 

## 2021-10-26 NOTE — Assessment & Plan Note (Signed)
Hypoxic on room air to the 80s, CXR with pulmonary vascular congestion and left pleural effusion and elevated bnp supports acute on chronic diastolic CHF, ? Right heart failure  ?-continue 40mg  lasix IV daily ?-strict I/O ?-daily weights ?-repeat echo pending ?-last echo 10/2019: EF 60-65% with normal LVF. Indeterminate DD, ? RHF.  ?-Continue ARB  ?

## 2021-10-26 NOTE — H&P (Signed)
?History and Physical  ? ? ?Patient: Daisy [name] CLE:[id number] DOB: 12/14/1950 ?DOA: 10/25/2021 ?DOS: the patient was seen and examined on 10/26/2021 ?PCP: Lesleigh Noe, MD  ?Patient coming from:  [place]  - lives with her daughter, uses walker  ? ? ?Chief Complaint: shortness of breath/hypoxia  ? ?HPI: Daisy [name] is a 71 y.o. female with medical history significant of CKD stage 3b, GAD/depression, hx of breast cancer,  HLD, HTN, COPD, diastolic CHF, obesity hypoventilation syndrome on bipap, hx of chronic respiratory failure, but not on oxygen who presented to her pulmonology office today with low oxygen. This morning she was short of breath and family checked oxygen and it was 81%.  They still have oxygen at home and after putting her on 2L her oxygen was in the low 90s. In the office they suggested she go to ED. She also developed a chest cold last week. She states she has a mild productive cough with clear phlegm.  ? ?She denies any weight gain or orthopnea  ?She endorses lower back pain that is worse with movement that started about one week ago. 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 No numbness in legs, no weakness, no numbness in her buttocks or urinary incontinence. Pain rated as a 8-9/10 with movement, okay with rest.  ? ?Does not smoke or drink alcohol  ? ? ?Denies any fever/chills. Denies sick contacts, vision changes/headaches, chest pain or palpitations, abdominal pain, N/V/D, dysuria or leg swelling.  ? ? ?ER Course:  vitals: afebrile, bp: 108/55, HR: 60, RR: 24, oxygen: 91% on 2L oxygen ?Pertinent labs: creatinine: 1.85 (1.6-1.7), wbc: 10.8, bnp: 187, covid/flu negative,  ?CXR: enlarged cardiac silhouette with pulmonary vascular congestion. Left basilar opacity is consistent with left pleural effusion and associated atelectasis vs. Consolidation left lower lobe.  ?In ED: given abx and 40mg  lasix.  ? ? ? ?Review of Systems: As mentioned in the history of present illness. All other systems reviewed  and are negative. ?Past Medical History:  ?Diagnosis Date  ? Acid reflux   ? Acute heart failure ([place]) 10/17/2019  ? AKI (acute kidney injury) ([place]) 10/17/2019  ? Arthritis   ? Breast cancer of upper-outer quadrant of right female breast ([place]) 11/19/2016  ? Chicken pox   ? Cholecystitis   ? Chronic diastolic heart failure ([place]) 01/04/2020  ? CKD (chronic kidney disease), stage III ([place]) 01/04/2020  ? Depression   ? Dyspnea   ? History of radiation therapy 01/20/17-03/02/17  ? left breast was treated to 50.4 Gy in 18 fractions  ? HLD (hyperlipidemia) 01/04/2020  ? Hyperlipemia   ? Irregular heart rate   ? Malignant neoplasm of upper-outer quadrant of left breast in female, estrogen receptor positive ([place]) 09/30/2016  ? Morbid obesity with BMI of 50.0-59.9, adult ([place]) 01/04/2020  ? Obesity   ? Peripheral vascular disease ([place])   ? Pneumonia   ? Prediabetes 01/04/2020  ? Urinary tract infection   ? Urinary tract infection with hematuria   ? ?Past Surgical History:  ?Procedure Laterality Date  ? ABDOMINAL HYSTERECTOMY    ? BREAST LUMPECTOMY Left 11/19/2016  ? BREAST LUMPECTOMY WITH RADIOACTIVE SEED AND SENTINEL LYMPH NODE BIOPSY (Left)  ? BREAST LUMPECTOMY WITH RADIOACTIVE SEED AND SENTINEL LYMPH NODE BIOPSY Left 11/19/2016  ? Procedure: BREAST LUMPECTOMY WITH RADIOACTIVE SEED AND SENTINEL LYMPH NODE BIOPSY;  Surgeon: Rolm Bookbinder, MD;  Location: [facility];  Service: General;  Laterality: Left;  ? CESAREAN SECTION    ? CESAREAN SECTION    ?  x 2  ? CHOLECYSTECTOMY  2007  ? KNEE ARTHROSCOPY    ? bilateral  ? KNEE SURGERY Bilateral   ? LOWER EXTREMITY ANGIOGRAM N/A 04/04/2014  ? Procedure: LOWER EXTREMITY ANGIOGRAM;  Surgeon: Laverda Page, MD;  Location: [facility] CATH LAB;  Service: Cardiovascular;  Laterality: N/A;  ? OVARIAN CYST REMOVAL    ? RE-EXCISION OF BREAST LUMPECTOMY Left 12/12/2016  ? Procedure: RE-EXCISION OF LEFT BREAST LUMPECTOMY;  Surgeon: Rolm Bookbinder, MD;  Location: [facility];  Service: General;  Laterality: Left;  ?  ROTATOR CUFF REPAIR Right   ? TONSILLECTOMY    ? WRIST SURGERY Bilateral   ? ?Social History:  reports that she has been smoking e-cigarettes and cigarettes. She has a 30.00 pack-year smoking history. She has never used smokeless tobacco. She reports that she does not drink alcohol and does not use drugs. ? ?Allergies  ?Allergen Reactions  ? Elemental Sulfur Hives  ? Levaquin [Levofloxacin] Other (See Comments)  ?  Body aches  ? Statins   ? ? ?Family History  ?Problem Relation Age of Onset  ? Heart disease Mother   ? Lung cancer Father   ? Heart disease Father   ? Heart disease Brother   ? ? ?Prior to Admission medications   ?Medication Sig Start Date End Date Taking? Authorizing Provider  ?albuterol (VENTOLIN HFA) 108 (90 Base) MCG/ACT inhaler TAKE 2 PUFFS BY MOUTH EVERY 6 HOURS AS NEEDED FOR WHEEZE OR SHORTNESS OF BREATH 08/06/21  Yes Lesleigh Noe, MD  ?aspirin 81 MG chewable tablet Chew 81 mg by mouth daily.   Yes [provider]  ?cilostazol (PLETAL) 100 MG tablet TAKE 1 TABLET BY MOUTH TWICE A DAY 05/22/21  Yes Adrian Prows, MD  ?dapagliflozin propanediol (FARXIGA) 5 MG TABS tablet Take by mouth daily.   Yes [provider]  ?FLUoxetine (PROZAC) 20 MG capsule TAKE 1 CAPSULE BY MOUTH EVERY DAY 08/06/21  Yes Lesleigh Noe, MD  ?KLOR-CON M20 20 MEQ tablet TAKE 1 TABLET BY MOUTH EVERY DAY 05/23/21  Yes Lesleigh Noe, MD  ?losartan (COZAAR) 25 MG tablet Take 1 tablet (25 mg total) by mouth daily. 03/20/21 03/20/22 Yes Lesleigh Noe, MD  ?metoprolol succinate (TOPROL-XL) 25 MG 24 hr tablet Take 0.5 tablets (12.5 mg total) by mouth daily. 03/20/21  Yes Lesleigh Noe, MD  ?NEXLIZET 180-10 MG TABS TAKE 1 TABLET BY MOUTH EVERY DAY 04/08/21  Yes Adrian Prows, MD  ?pantoprazole (PROTONIX) 20 MG tablet Take 1 tablet (20 mg total) by mouth daily. 03/20/21  Yes Lesleigh Noe, MD  ?torsemide (DEMADEX) 10 MG tablet Take 1 tablet (10 mg total) by mouth 2 (two) times daily. ?Patient taking differently: Take 10  mg by mouth daily. 03/20/21  Yes Lesleigh Noe, MD  ?vitamin B-12 (CYANOCOBALAMIN) 500 MCG tablet Take by mouth.   Yes [provider]  ?acetaminophen (TYLENOL) 325 MG tablet Take 2 tablets (650 mg total) by mouth every 4 (four) hours as needed for headache or mild pain. 11/02/19   Guilford Shi, MD  ? ? ?Physical Exam: ?Vitals:  ? 10/26/21 1300 10/26/21 1400 10/26/21 1502 10/26/21 1540  ?BP: 129/80 126/60 125/77 (!) 120/49  ?Pulse: 95 88 94 95  ?Resp: 20 (!) 21 (!) 22 18  ?Temp:   98.3 ?F (36.8 ?C) 98.1 ?F (36.7 ?C)  ?TempSrc:   Oral Oral  ?SpO2: 94% 93% 99% 93%  ?Weight:      ?Height:      ? ?  General:  Appears calm and comfortable and is in NAD. Obese  ?Eyes:  PERRL, EOMI, normal lids, iris ?ENT:  grossly normal hearing, lips & tongue, mmm; appropriate dentition ?Neck:  no LAD, masses or thyromegaly; no carotid bruits,no JVD ?Cardiovascular:  RRR, no m/r/g. No LE edema.  ?Respiratory:   CTA bilaterally with no wheezes/rales/rhonchi. Occasional quiet expiratory wheeze.  Normal respiratory effort. ?Abdomen:  soft, NT, ND, NABS ?Back:   normal alignment, no CVAT ?Skin:  no rash or induration seen on limited exam ?Musculoskeletal:  grossly normal tone BUE/BLE, good ROM, no bony abnormality ?Lower extremity:  No LE edema.  Limited foot exam with no ulcerations.  2+ distal pulses. ?Psychiatric:  grossly normal mood and affect, speech fluent and appropriate, AOx3 ?Neurologic:  CN 2-12 grossly intact, moves all extremities in coordinated fashion, sensation intact ? ? ?Radiological Exams on Admission: ?Independently reviewed - see discussion in A/P where applicable ? ?DG Chest 2 View ? ?Result Date: 10/25/2021 ?CLINICAL DATA:  Shortness of breath, hypoxia, chest congestion EXAM: CHEST - 2 VIEW COMPARISON:  10/24/2019 FINDINGS: Enlargement of cardiac silhouette with pulmonary vascular congestion. Mediastinal contours normal with atherosclerotic calcifications aorta. LEFT pleural effusion and basilar atelectasis  versus consolidation. Remaining lungs clear. No pneumothorax or acute osseous findings. IMPRESSION: Enlargement of cardiac silhouette with pulmonary vascular congestion. LEFT basilar opacity in consisten

## 2021-10-26 NOTE — Progress Notes (Signed)
Pt left per bed for XR. Left in stable condition with 2 L/M [place] oxygen. IV ATB stopped at this time ?

## 2021-10-26 NOTE — Assessment & Plan Note (Signed)
S/p stent ?Continue ASA and pletal  ?

## 2021-10-26 NOTE — Assessment & Plan Note (Signed)
Baseline creatinine of 1.6-1.7 ?Stable, continue to monitor with diuresis  ?

## 2021-10-26 NOTE — Assessment & Plan Note (Signed)
71 year old female with history of OHS who used to be on oxygen presented to pulmonology clinic with hypoxia to 80s on room air likely secondary to acute diastolic CHF/cor pulmonale and possible pneumonia ?-admit to tele ?-continue abx x 24 hours to rule out pneumonia, seems less likely given clinical picture ?-received 40mg  IV lasix in ED yesterday, will continue today and monitor output ?-has past history of chronic respiratory failure, but tells me she was taken off oxygen because she didn't need it anymore  ?-hx of COPD and has very occasional expiratory wheeze, do not believe in exacerbation contributing to respiratory failure  ?-wean oxygen as tolerated  ?

## 2021-10-26 NOTE — ED Notes (Signed)
Pt awake, alert and oriented with family members at bedside.  RT at bedside completing eval.  Pt on 2L O2 via  at this time with continuous cardiac and pulse ox monitoring.  Bilat upper lobe lung sounds diminished posteriorly with mild crackles auscultated to lower bilat lobes posteriorly.   S1 and S2 irregular with auscultation.  Abdomen round and soft.  Skin intact.   ?

## 2021-10-26 NOTE — Assessment & Plan Note (Signed)
Mildly elevated WBC, hx of a cold last week and mild productive cough with clear phlegm ?She has had no fever, but has resp. Failure with hypoxia ?Still favor more CHF, but will continue antibiotics with rocephin and azithromycin ?Pct pending ?Duoneb/albuterol  ?

## 2021-10-27 ENCOUNTER — Inpatient Hospital Stay (HOSPITAL_COMMUNITY): Payer: Medicare PPO

## 2021-10-27 DIAGNOSIS — J9621 Acute and chronic respiratory failure with hypoxia: Secondary | ICD-10-CM | POA: Diagnosis not present

## 2021-10-27 DIAGNOSIS — I509 Heart failure, unspecified: Secondary | ICD-10-CM

## 2021-10-27 DIAGNOSIS — J9601 Acute respiratory failure with hypoxia: Secondary | ICD-10-CM | POA: Diagnosis not present

## 2021-10-27 DIAGNOSIS — I5033 Acute on chronic diastolic (congestive) heart failure: Secondary | ICD-10-CM

## 2021-10-27 DIAGNOSIS — J9622 Acute and chronic respiratory failure with hypercapnia: Secondary | ICD-10-CM

## 2021-10-27 LAB — CBC
HCT: 48.7 % — ABNORMAL HIGH (ref 36.0–46.0)
Hemoglobin: 14.9 g/dL (ref 12.0–15.0)
MCH: 31.6 pg (ref 26.0–34.0)
MCHC: 30.6 g/dL (ref 30.0–36.0)
MCV: 103.2 fL — ABNORMAL HIGH (ref 80.0–100.0)
Platelets: 230 10*3/uL (ref 150–400)
RBC: 4.72 MIL/uL (ref 3.87–5.11)
RDW: 14.4 % (ref 11.5–15.5)
WBC: 10.4 10*3/uL (ref 4.0–10.5)
nRBC: 0 % (ref 0.0–0.2)

## 2021-10-27 LAB — BASIC METABOLIC PANEL
Anion gap: 9 (ref 5–15)
BUN: 16 mg/dL (ref 8–23)
CO2: 30 mmol/L (ref 22–32)
Calcium: 9 mg/dL (ref 8.9–10.3)
Chloride: 101 mmol/L (ref 98–111)
Creatinine, Ser: 1.67 mg/dL — ABNORMAL HIGH (ref 0.44–1.00)
GFR, Estimated: 33 mL/min — ABNORMAL LOW (ref >60)
Glucose, Bld: 106 mg/dL — ABNORMAL HIGH (ref 70–99)
Potassium: 3.8 mmol/L (ref 3.5–5.1)
Sodium: 140 mmol/L (ref 135–145)

## 2021-10-27 LAB — PROCALCITONIN: Procalcitonin: 12.8 ng/mL

## 2021-10-27 MED ORDER — HYDROCODONE-ACETAMINOPHEN 5-325 MG PO TABS: 1.0000 | ORAL_TABLET | Freq: Four times a day (QID) | ORAL | Status: DC | PRN

## 2021-10-27 MED ORDER — FUROSEMIDE 10 MG/ML IJ SOLN
40.0000 mg | Freq: Two times a day (BID) | INTRAMUSCULAR | Status: DC
  Administered 2021-10-27: 40 mg via INTRAVENOUS
  Filled 2021-10-27 (×2): qty 4

## 2021-10-27 MED ORDER — DICLOFENAC SODIUM 1 % EX GEL
2.0000 g | Freq: Three times a day (TID) | CUTANEOUS | Status: DC
  Administered 2021-10-27 – 2021-10-29 (×6): 2 g via TOPICAL
  Filled 2021-10-27: qty 100

## 2021-10-27 MED ORDER — FUROSEMIDE 10 MG/ML IJ SOLN
40.0000 mg | Freq: Once | INTRAMUSCULAR | Status: AC
  Administered 2021-10-27: 40 mg via INTRAVENOUS

## 2021-10-27 MED ORDER — AZITHROMYCIN 500 MG PO TABS
500.0000 mg | ORAL_TABLET | Freq: Every day | ORAL | Status: DC
  Administered 2021-10-27 – 2021-10-28 (×2): 500 mg via ORAL
  Filled 2021-10-27 (×2): qty 1

## 2021-10-27 MED ORDER — POTASSIUM CHLORIDE CRYS ER 20 MEQ PO TBCR
40.0000 meq | EXTENDED_RELEASE_TABLET | Freq: Once | ORAL | Status: AC
  Administered 2021-10-27: 40 meq via ORAL
  Filled 2021-10-27: qty 2

## 2021-10-27 MED ORDER — PERFLUTREN LIPID MICROSPHERE
1.0000 mL | INTRAVENOUS | Status: AC | PRN
  Administered 2021-10-27: 4 mL via INTRAVENOUS
  Filled 2021-10-27: qty 10

## 2021-10-27 MED ORDER — IPRATROPIUM-ALBUTEROL 0.5-2.5 (3) MG/3ML IN SOLN
3.0000 mL | Freq: Four times a day (QID) | RESPIRATORY_TRACT | Status: DC
  Administered 2021-10-27 – 2021-10-28 (×5): 3 mL via RESPIRATORY_TRACT
  Filled 2021-10-27 (×5): qty 3

## 2021-10-27 NOTE — Progress Notes (Signed)
?PROGRESS NOTE ? ? ? ?Daisy [name]  YDX:[id number] DOB: [birth year] DOA: 10/25/2021 ?PCP: Lesleigh Noe, MD  ?71 y.o. female with morbid obesity, BMI 85, chronic diastolic CHF, COPD, CKD 3a, chronic respiratory failure, OSA/OHS on nightly BiPAP, on home O2 as needed  presented to her pulmonology office today with low oxygen. This morning she was short of breath and family checked oxygen and it was 81%.  ?-, Patient reported having a chest cold, mild cough productive of clear phlegm, reports low back pain and some dyspnea on exertion. ?-In the ED vitals were stable, O2 sats were 91% on 2 L, creatinine was 1.85, BNP 187, chest x-ray noted enlarged cardiac silhouette with pulmonary vascular congestion, left basilar opacity consistent with left pleural effusion and associated atelectasis versus left lower lobe consolidation ? ? ?Subjective: ?-Feels better overall, breathing a little better ? ?Assessment and Plan: ? ?Acute respiratory failure with hypoxia and hypercapnia (HCC) ?-likely secondary to acute diastolic CHF/cor pulmonale and possible pneumonia ?-Continue IV Lasix today, she is 2 L negative ?-Some productive cough with questionable pneumonia versus atelectasis on chest x-ray and significantly elevated procalcitonin, will continue IV ceftriaxone/azithromycin ?-Continue DuoNebs ?-Increase activity, ambulate, wean O2 ? ?Acute on chronic diastolic CHF (congestive heart failure) ([place]) ?-Continue IV Lasix as noted above, hold therapy today ?-Repeat echo ordered on admission, will follow-up ?-last echo 10/2019: EF 60-65% with normal LVF. Indeterminate DD, ? RHF.  ?-Continue Wilder Glade ? ?Community-acquired pneumonia ?-Antibiotics as noted above ? ?COPD with chronic bronchitis and emphysema ([place]) ?She has rare expiratory wheeze ?-Continue scheduled duonebs, continue SABA  ?-If worsens will start steroids  ? ?Low back pain ?-X-rays with DJD, add Voltaren gel ? ?Obesity hypoventilation syndrome ([place]) ?Unsure if  contributing to her symptoms, ?Continue bipap at night, uses trilogy at home ? ?CKD (chronic kidney disease), stage III ([place]) ?Baseline creatinine of 1.6-1.7 ?Stable, continue to monitor with diuresis  ? ?Essential hypertension ?Well controlled  ?Continue toprol-xl  ? ?Generalized anxiety disorder ?Continue prozac  ? ?PAD (peripheral artery disease) ([place]) ?S/p stent ?Continue ASA and pletal  ? ?HLD (hyperlipidemia) ?Continue nexlizet  ? ?Morbid obesity, BMI 53.2 ?-Poor prognostic factor ? ?DVT prophylaxis: Lovenox ?Code Status: Full code ?Family Communication: Discussed patient in detail, no family at bedside ?Disposition Plan: Home in 1 to 2 days ? ?Consultants:  ? ? ?Procedures:  ? ?Antimicrobials:  ? ? ?Objective: ?Vitals:  ? 10/26/21 2024 10/26/21 2143 10/27/21 0041 10/27/21 0807  ?BP: 94/63 (!) 103/40 (!) 149/134 (!) 110/48  ?Pulse:  99 85 96  ?Resp:  20 (!) 25 20  ?Temp:    97.8 ?F (36.6 ?C)  ?TempSrc:    Oral  ?SpO2:  (!) 88% (!) 89% (!) 81%  ?Weight:      ?Height:      ? ? ?Intake/Output Summary (Last 24 hours) at 10/27/2021 1013 ?Last data filed at 10/26/2021 2127 ?Gross per 24 hour  ?Intake 82.8 ml  ?Output --  ?Net 82.8 ml  ? ?Filed Weights  ? 10/25/21 1655  ?Weight: (!) 154.2 kg  ? ? ?Examination: ? ?General exam: More really obese pleasant female sitting up in bed, AAOx3 ?Respiratory system: Few scattered basilar rhonchi, occasional expiratory wheeze ?Cardiovascular system: S1 & S2 heard, RRR.  ?Abd: nondistended, soft and nontender.Normal bowel sounds heard. ?Central nervous system: Alert and oriented. No focal neurological deficits. ?Extremities: no edema ?Skin: No rashes ?Psychiatry:  Mood & affect appropriate.  ? ? ? ?Data Reviewed:  ? ?CBC: ?Recent Labs  ?  Lab 10/25/21 ?1724 10/25/21 ?1942 10/27/21 ?0110  ?WBC 10.8*  --  10.4  ?HGB 14.4 16.0* 14.9  ?HCT 48.2* 47.0* 48.7*  ?MCV 101.9*  --  103.2*  ?PLT 258  --  230  ? ?Basic Metabolic Panel: ?Recent Labs  ?Lab 10/25/21 ?1724 10/25/21 ?1942  10/26/21 ?1843 10/27/21 ?0110  ?NA 139 137  --  140  ?K 4.1 4.4  --  3.8  ?CL 102  --   --  101  ?CO2 28  --   --  30  ?GLUCOSE 131*  --   --  106*  ?BUN 19  --   --  16  ?CREATININE 1.85*  --   --  1.67*  ?CALCIUM 9.2  --   --  9.0  ?MG  --   --  1.9  --   ? ?GFR: ?Estimated Creatinine Clearance: 48.1 mL/min (A) (by C-G formula based on SCr of 1.67 mg/dL (H)). ?Liver Function Tests: ?No results for input(s): AST, ALT, ALKPHOS, BILITOT, PROT, ALBUMIN in the last 168 hours. ?No results for input(s): LIPASE, AMYLASE in the last 168 hours. ?No results for input(s): AMMONIA in the last 168 hours. ?Coagulation Profile: ?No results for input(s): INR, PROTIME in the last 168 hours. ?Cardiac Enzymes: ?No results for input(s): CKTOTAL, CKMB, CKMBINDEX, TROPONINI in the last 168 hours. ?BNP (last 3 results) ?No results for input(s): PROBNP in the last 8760 hours. ?HbA1C: ?No results for input(s): HGBA1C in the last 72 hours. ?CBG: ?No results for input(s): GLUCAP in the last 168 hours. ?Lipid Profile: ?No results for input(s): CHOL, HDL, LDLCALC, TRIG, CHOLHDL, LDLDIRECT in the last 72 hours. ?Thyroid Function Tests: ?No results for input(s): TSH, T4TOTAL, FREET4, T3FREE, THYROIDAB in the last 72 hours. ?Anemia Panel: ?No results for input(s): VITAMINB12, FOLATE, FERRITIN, TIBC, IRON, RETICCTPCT in the last 72 hours. ?Urine analysis: ?   ?Component Value Date/Time  ? COLORURINE YELLOW 10/17/2019 1429  ? APPEARANCEUR CLOUDY (A) 10/17/2019 1429  ? APPEARANCEUR Cloudy 04/07/2014 2005  ? LABSPEC 1.010 10/17/2019 1429  ? LABSPEC 1.006 04/07/2014 2005  ? PHURINE 6.0 10/17/2019 1429  ? GLUCOSEU NEGATIVE 10/17/2019 1429  ? GLUCOSEU Negative 04/07/2014 2005  ? HGBUR SMALL (A) 10/17/2019 1429  ? BILIRUBINUR neg 03/26/2020 1109  ? BILIRUBINUR Negative 04/07/2014 2005  ? [place] NEGATIVE 10/17/2019 1429  ? PROTEINUR Negative 03/26/2020 1109  ? PROTEINUR NEGATIVE 10/17/2019 1429  ? UROBILINOGEN 2.0 (A) 03/26/2020 1109  ? NITRITE pos  03/26/2020 1109  ? NITRITE POSITIVE (A) 10/17/2019 1429  ? LEUKOCYTESUR Moderate (2+) (A) 03/26/2020 1109  ? LEUKOCYTESUR LARGE (A) 10/17/2019 1429  ? LEUKOCYTESUR 3+ 04/07/2014 2005  ? ?Sepsis Labs: ?@LABRCNTIP (procalcitonin:4,lacticidven:4) ? ?) ?Recent Results (from the past 240 hour(s))  ?Resp Panel by RT-PCR (Flu A&B, Covid) Nasopharyngeal Swab     Status: None  ? Collection Time: 10/25/21  9:15 PM  ? Specimen: Nasopharyngeal Swab; Nasopharyngeal(NP) swabs in vial transport medium  ?Result Value Ref Range Status  ? SARS Coronavirus 2 by RT PCR NEGATIVE NEGATIVE Final  ?  Comment: (NOTE) ?SARS-CoV-2 target nucleic acids are NOT DETECTED. ? ?The SARS-CoV-2 RNA is generally detectable in upper respiratory ?specimens during the acute phase of infection. The lowest ?concentration of SARS-CoV-2 viral copies this assay can detect is ?138 copies/mL. A negative result does not preclude SARS-Cov-2 ?infection and should not be used as the sole basis for treatment or ?other patient management decisions. A negative result may occur with  ?improper specimen collection/handling, submission of specimen other ?  than nasopharyngeal swab, presence of viral mutation(s) within the ?areas targeted by this assay, and inadequate number of viral ?copies(<138 copies/mL). A negative result must be combined with ?clinical observations, patient history, and epidemiological ?information. The expected result is Negative. ? ?Fact Sheet for Patients:  ?EntrepreneurPulse.com.au ? ?Fact Sheet for Healthcare Providers:  ?IncredibleEmployment.be ? ?This test is no t yet approved or cleared by the [place] FDA and  ?has been authorized for detection and/or diagnosis of SARS-CoV-2 by ?FDA under an Emergency Use Authorization (EUA). This EUA will remain  ?in effect (meaning this test can be used) for the duration of the ?COVID-19 declaration under Section 564(b)(1) of the Act, 21 ?U.S.C.section 360bbb-3(b)(1),  unless the authorization is terminated  ?or revoked sooner.  ? ? ?  ? Influenza A by PCR NEGATIVE NEGATIVE Final  ? Influenza B by PCR NEGATIVE NEGATIVE Final  ?  Comment: (NOTE) ?The Xpert Xpress SARS-CoV-2/FLU/RSV plus a

## 2021-10-27 NOTE — Progress Notes (Signed)
?  Echocardiogram ?2D Echocardiogram has been performed. ? ?Daisy [name] ?10/27/2021, 11:36 AM ?

## 2021-10-27 NOTE — Evaluation (Signed)
Physical Therapy Evaluation ?Patient Details ?Name: Daisy [name] ?MRN: [id number] ?DOB: [birth year] ?Today's Date: 10/27/2021 ? ?History of Present Illness ? 71 y/o female presented to ED on 5/12 for SOB with apnea alarm on BiPAP at home. Admitted for acute respiratory failure. CXR showed pulmonary vascular congestion and L pleural effusion. PMH: CHF, COPD, HTN, PAD, CKD  ?Clinical Impression ? Patient admitted with the above findings. Patient presents with decreased activity tolerance, impaired balance, and generalized weakness. Patient on RA on arrival with spO2 77%, RT present also and donned 3L O2 [place] with slow increased to 96%. Patient able to stand from recliner with supervision and ambulate short distance with standing rest break x 2 and min guard. Ambulated on 3L O2 with drop to 87% with little recovery so increased to 4L O2 with spO2 >90%. PTA, patient lives with daughter and reports limited household ambulator with use of RW and daughter assists with getting in/out of tub for showers. Patient will benefit from skilled PT services during acute stay to address listed deficits. Recommend HHPT at discharge to maximize functional independence and safety.    ?   ? ?Recommendations for follow up therapy are one component of a multi-disciplinary discharge planning process, led by the attending physician.  Recommendations may be updated based on patient status, additional functional criteria and insurance authorization. ? ?Follow Up Recommendations Home health PT ? ?  ?Assistance Recommended at Discharge Intermittent Supervision/Assistance  ?Patient can return home with the following ?   ? ?  ?Equipment Recommendations None recommended by PT  ?Recommendations for Other Services ? OT consult  ?  ?Functional Status Assessment Patient has had a recent decline in their functional status and demonstrates the ability to make significant improvements in function in a reasonable and predictable amount of time.  ? ?   ?Precautions / Restrictions Precautions ?Precautions: Fall ?Precaution Comments: watch O2 ?Restrictions ?Weight Bearing Restrictions: No  ? ?  ? ?Mobility ? Bed Mobility ?  ?  ?  ?  ?  ?  ?  ?General bed mobility comments: up in recliner on arrival ?  ? ?Transfers ?Overall transfer level: Needs assistance ?Equipment used: Rolling Talah [place] (2 wheels) ?Transfers: Sit to/from Stand, Bed to chair/wheelchair/BSC ?Sit to Stand: Supervision ?  ?Step pivot transfers: Supervision ?  ?  ?  ?General transfer comment: supervision for safety. Able to take pivotal steps to [facility] with superivsion ?  ? ?Ambulation/Gait ?Ambulation/Gait assistance: Min guard ?Gait Distance (Feet): 20 Feet ?Assistive device: Rolling Jnyah [place] (2 wheels) ?Gait Pattern/deviations: Step-through pattern, Decreased stride length, Trunk flexed ?Gait velocity: decreased ?  ?  ?General Gait Details: standing rest break x 2 during mobility. Patient reporting her legs feel weak during last leg of ambulation. Patient leaning on RW during rest breaks. Educated on safety concerns ? ?Stairs ?  ?  ?  ?  ?  ? ?Wheelchair Mobility ?  ? ?Modified Rankin (Stroke Patients Only) ?  ? ?  ? ?Balance Overall balance assessment: Needs assistance ?Sitting-balance support: No upper extremity supported, Feet supported ?Sitting balance-Leahy Scale: Good ?  ?  ?Standing balance support: Bilateral upper extremity supported, Reliant on assistive device for balance ?Standing balance-Leahy Scale: Poor ?Standing balance comment: reliant on UE support ?  ?  ?  ?  ?  ?  ?  ?  ?  ?  ?  ?   ? ? ? ?Pertinent Vitals/Pain Pain Assessment ?Pain Assessment: Faces ?Faces Pain Scale: Hurts little more ?Pain Location: back ?Pain  Descriptors / Indicators: Guarding ?Pain Intervention(s): Monitored during session, Repositioned, Limited activity within patient's tolerance  ? ? ?Home Living Family/patient expects to be discharged to:: Private residence ?Living Arrangements: Children ?Available Help at  Discharge: Family ?Type of Home: House ?Home Access: Level entry ?  ?  ?  ?Home Layout: One level ?Home Equipment: Conservation officer, nature (2 wheels);Rollator (4 wheels);Tub bench ?   ?  ?Prior Function Prior Level of Function : Needs assist ?  ?  ?  ?  ?  ?  ?Mobility Comments: uses RW for household mobility. Does not ambulate in community. Uses w/c at [place] ?ADLs Comments: daughter assists with getting in/out of tub ?  ? ? ?Hand Dominance  ?   ? ?  ?Extremity/Trunk Assessment  ? Upper Extremity Assessment ?Upper Extremity Assessment: Generalized weakness ?  ? ?Lower Extremity Assessment ?Lower Extremity Assessment: Generalized weakness ?  ? ?Cervical / Trunk Assessment ?Cervical / Trunk Assessment: Kyphotic;Other exceptions (increased body habitus)  ?Communication  ? Communication: No difficulties  ?Cognition Arousal/Alertness: Awake/alert ?Behavior During Therapy: [facility] for tasks assessed/performed ?Overall Cognitive Status: Within Functional Limits for tasks assessed ?  ?  ?  ?  ?  ?  ?  ?  ?  ?  ?  ?  ?  ?  ?  ?  ?  ?  ?  ? ?  ?General Comments General comments (skin integrity, edema, etc.): On RA on arrival, spO2 77%. RT present and donned 3L O2 [place] with slow increased to 96%. During mobility, drop to 87% on 3L O2 [place] with no increase despite pursed lip breathing. Increased to 4L O2 with spO2 >90% ? ?  ?Exercises    ? ?Assessment/Plan  ?  ?PT Assessment Patient needs continued PT services  ?PT Problem List Decreased strength;Decreased mobility;Decreased balance;Decreased activity tolerance;Decreased safety awareness;Cardiopulmonary status limiting activity ? ?   ?  ?PT Treatment Interventions DME instruction;Gait training;Therapeutic activities;Therapeutic exercise;Functional mobility training;Balance training;Patient/family education   ? ?PT Goals (Current goals can be found in the Care Plan section)  ?Acute Rehab PT Goals ?Patient Stated Goal: to go home ?PT Goal Formulation: With patient ?Time For Goal  Achievement: 11/10/21 ?Potential to Achieve Goals: Good ? ?  ?Frequency Min 3X/week ?  ? ? ?Co-evaluation   ?  ?  ?  ?  ? ? ?  ?AM-PAC PT "6 Clicks" Mobility  ?Outcome Measure Help needed turning from your back to your side while in a flat bed without using bedrails?: A Little ?Help needed moving from lying on your back to sitting on the side of a flat bed without using bedrails?: A Little ?Help needed moving to and from a bed to a chair (including a wheelchair)?: A Little ?Help needed standing up from a chair using your arms (e.g., wheelchair or bedside chair)?: A Little ?Help needed to walk in hospital room?: A Little ?Help needed climbing 3-5 steps with a railing? : A Little ?6 Click Score: 18 ? ?  ?End of Session Equipment Utilized During Treatment: Oxygen ?Activity Tolerance: Patient tolerated treatment well ?Patient left: in chair;with call bell/phone within reach ?Nurse Communication: Mobility status ?PT Visit Diagnosis: Muscle weakness (generalized) (M62.81);Unsteadiness on feet (R26.81) ?  ? ?Time: 5093-2671 ?PT Time Calculation (min) (ACUTE ONLY): 33 min ? ? ?Charges:   PT Evaluation ?$PT Eval Moderate Complexity: 1 Mod ?PT Treatments ?$Therapeutic Activity: 8-22 mins ?  ?   ? ? ?Lilia Letterman A. Gilford Rile, PT, DPT ?Acute Rehabilitation Services ?Pager [phone] ?Office [phone] ? ? ?  Nhyla Nappi A Louis Gaw ?10/27/2021, 3:39 PM ? ?

## 2021-10-28 DIAGNOSIS — J9601 Acute respiratory failure with hypoxia: Secondary | ICD-10-CM | POA: Diagnosis not present

## 2021-10-28 DIAGNOSIS — I509 Heart failure, unspecified: Secondary | ICD-10-CM | POA: Diagnosis not present

## 2021-10-28 DIAGNOSIS — I5033 Acute on chronic diastolic (congestive) heart failure: Secondary | ICD-10-CM | POA: Diagnosis not present

## 2021-10-28 DIAGNOSIS — J9621 Acute and chronic respiratory failure with hypoxia: Secondary | ICD-10-CM | POA: Diagnosis not present

## 2021-10-28 LAB — BASIC METABOLIC PANEL WITH GFR
Anion gap: 12 (ref 5–15)
BUN: 18 mg/dL (ref 8–23)
CO2: 27 mmol/L (ref 22–32)
Calcium: 9.1 mg/dL (ref 8.9–10.3)
Chloride: 96 mmol/L — ABNORMAL LOW (ref 98–111)
Creatinine, Ser: 1.81 mg/dL — ABNORMAL HIGH (ref 0.44–1.00)
GFR, Estimated: 30 mL/min — ABNORMAL LOW (ref >60)
Glucose, Bld: 120 mg/dL — ABNORMAL HIGH (ref 70–99)
Potassium: 3.7 mmol/L (ref 3.5–5.1)
Sodium: 135 mmol/L (ref 135–145)

## 2021-10-28 LAB — CBC
HCT: 46.2 % — ABNORMAL HIGH (ref 36.0–46.0)
Hemoglobin: 14.2 g/dL (ref 12.0–15.0)
MCH: 31.1 pg (ref 26.0–34.0)
MCHC: 30.7 g/dL (ref 30.0–36.0)
MCV: 101.1 fL — ABNORMAL HIGH (ref 80.0–100.0)
Platelets: 237 10*3/uL (ref 150–400)
RBC: 4.57 MIL/uL (ref 3.87–5.11)
RDW: 14 % (ref 11.5–15.5)
WBC: 9.9 10*3/uL (ref 4.0–10.5)
nRBC: 0 % (ref 0.0–0.2)

## 2021-10-28 MED ORDER — IPRATROPIUM-ALBUTEROL 0.5-2.5 (3) MG/3ML IN SOLN
3.0000 mL | Freq: Three times a day (TID) | RESPIRATORY_TRACT | Status: DC
  Administered 2021-10-29: 3 mL via RESPIRATORY_TRACT
  Filled 2021-10-28 (×2): qty 3

## 2021-10-28 MED ORDER — IPRATROPIUM-ALBUTEROL 0.5-2.5 (3) MG/3ML IN SOLN
3.0000 mL | Freq: Four times a day (QID) | RESPIRATORY_TRACT | Status: DC
  Administered 2021-10-28: 3 mL via RESPIRATORY_TRACT
  Filled 2021-10-28: qty 3

## 2021-10-28 MED ORDER — METHYLPREDNISOLONE SODIUM SUCC 125 MG IJ SOLR
120.0000 mg | INTRAMUSCULAR | Status: DC
  Administered 2021-10-28 – 2021-10-29 (×2): 120 mg via INTRAVENOUS
  Filled 2021-10-28 (×2): qty 2

## 2021-10-28 MED ORDER — FUROSEMIDE 10 MG/ML IJ SOLN
40.0000 mg | Freq: Two times a day (BID) | INTRAMUSCULAR | Status: AC
  Administered 2021-10-28 (×2): 40 mg via INTRAVENOUS
  Filled 2021-10-28 (×2): qty 4

## 2021-10-28 NOTE — Progress Notes (Signed)
?PROGRESS NOTE ? ? ? ?Daisy [name]  ZOX:[id number] DOB: [birth year] DOA: 10/25/2021 ?PCP: Lesleigh Noe, MD  ?71 y.o. female with morbid obesity, BMI 27, chronic diastolic CHF, COPD, CKD 3a, chronic respiratory failure, OSA/OHS on nightly BiPAP, on home O2 as needed  presented to her pulmonology office today with low oxygen. This morning she was short of breath and family checked oxygen and it was 81%.  ?-, Patient reported having a chest cold, mild cough productive of clear phlegm, reports low back pain and some dyspnea on exertion. ?-In the ED vitals were stable, O2 sats were 91% on 2 L, creatinine was 1.85, BNP 187, chest x-ray noted enlarged cardiac silhouette with pulmonary vascular congestion, left basilar opacity consistent with left pleural effusion and associated atelectasis versus left lower lobe consolidation ?-Admitted, started on Lasix and antibiotics ? ? ?Subjective: ?-Feels better, breathing improving overall, on 5 L O2 this morning ? ?Assessment and Plan: ? ?Acute respiratory failure with hypoxia and hypercapnia (HCC) ?-likely secondary to acute diastolic CHF/cor pulmonale and possible pneumonia ?-Continue IV Lasix today, she is 3.9 L negative ?-Some productive cough with questionable pneumonia versus atelectasis on chest x-ray and significantly elevated procalcitonin, will continue IV ceftriaxone, azithromycin ?-Continue DuoNebs, expiratory wheezes audible, will add some IV steroids as well ?-Increase activity, ambulate,  ?-On 5 L O2 this morning, attempt to wean down as tolerated  ?-Increase activity ? ?Acute on chronic diastolic CHF (congestive heart failure) ([place]) ?-Continue IV Lasix as noted above,  ?-Repeat echo ordered on admission, will follow-up ?-last echo 10/2019: EF 60-65% with normal LVF. Indeterminate DD, ? RHF.  ?-Continue Wilder Glade ? ?Community-acquired pneumonia ?-Antibiotics as noted above ? ?COPD with chronic bronchitis and emphysema ([place]) ?She has rare expiratory  wheeze ?-Continue scheduled duonebs, continue SABA  ?-Start IV Solu-Medrol today ? ?Low back pain ?-X-rays with DJD, Voltaren gel ? ?Obesity hypoventilation syndrome ([place]) ?Unsure if contributing to her symptoms, ?Continue bipap at night, uses trilogy at home ? ?CKD (chronic kidney disease), stage III ([place]) ?Baseline creatinine of 1.6-1.7 ?Stable, continue to monitor with diuresis  ? ?Essential hypertension ?Well controlled  ?Continue toprol-xl  ? ?Generalized anxiety disorder ?Continue prozac  ? ?PAD (peripheral artery disease) ([place]) ?S/p stent ?Continue ASA and pletal  ? ?HLD (hyperlipidemia) ?Continue nexlizet  ? ?Morbid obesity, BMI 53.2 ?-Poor prognostic factor ? ?DVT prophylaxis: Lovenox ?Code Status: Full code ?Family Communication: Discussed patient in detail, no family at bedside ?Disposition Plan: Home in 1 to 2 days ? ?Consultants:  ? ? ?Procedures:  ? ?Antimicrobials:  ? ? ?Objective: ?Vitals:  ? 10/28/21 0800 10/28/21 0851 10/28/21 1217 10/28/21 1251  ?BP: 137/71  134/63   ?Pulse: 87  95   ?Resp: 17  (!) 23   ?Temp:      ?TempSrc:      ?SpO2: 94% 94% 92% 93%  ?Weight:      ?Height:      ? ? ?Intake/Output Summary (Last 24 hours) at 10/28/2021 1332 ?Last data filed at 10/28/2021 8119 ?Gross per 24 hour  ?Intake --  ?Output 700 ml  ?Net -700 ml  ? ?Filed Weights  ? 10/25/21 1655  ?Weight: (!) 154.2 kg  ? ? ?Examination: ? ?General exam: Pleasant obese female sitting up in the recliner, AAOx3, no distress ?HEENT: Positive JVD ?CVS: S1-S2, regular rhythm ?Lungs: Bilateral expiratory wheezes and scattered rhonchi ?Abdomen: Soft, nontender, bowel sounds present ?Extremities: No edema  ?Skin: No rashes ?Psychiatry:  Mood & affect appropriate.  ? ? ? ?  Data Reviewed:  ? ?CBC: ?Recent Labs  ?Lab 10/25/21 ?1724 10/25/21 ?1942 10/27/21 ?0110 10/28/21 ?0132  ?WBC 10.8*  --  10.4 9.9  ?HGB 14.4 16.0* 14.9 14.2  ?HCT 48.2* 47.0* 48.7* 46.2*  ?MCV 101.9*  --  103.2* 101.1*  ?PLT 258  --  230 237  ? ?Basic Metabolic  Panel: ?Recent Labs  ?Lab 10/25/21 ?1724 10/25/21 ?1942 10/26/21 ?1843 10/27/21 ?0110 10/28/21 ?0132  ?NA 139 137  --  140 135  ?K 4.1 4.4  --  3.8 3.7  ?CL 102  --   --  101 96*  ?CO2 28  --   --  30 27  ?GLUCOSE 131*  --   --  106* 120*  ?BUN 19  --   --  16 18  ?CREATININE 1.85*  --   --  1.67* 1.81*  ?CALCIUM 9.2  --   --  9.0 9.1  ?MG  --   --  1.9  --   --   ? ?GFR: ?Estimated Creatinine Clearance: 44.4 mL/min (A) (by C-G formula based on SCr of 1.81 mg/dL (H)). ?Liver Function Tests: ?No results for input(s): AST, ALT, ALKPHOS, BILITOT, PROT, ALBUMIN in the last 168 hours. ?No results for input(s): LIPASE, AMYLASE in the last 168 hours. ?No results for input(s): AMMONIA in the last 168 hours. ?Coagulation Profile: ?No results for input(s): INR, PROTIME in the last 168 hours. ?Cardiac Enzymes: ?No results for input(s): CKTOTAL, CKMB, CKMBINDEX, TROPONINI in the last 168 hours. ?BNP (last 3 results) ?No results for input(s): PROBNP in the last 8760 hours. ?HbA1C: ?No results for input(s): HGBA1C in the last 72 hours. ?CBG: ?No results for input(s): GLUCAP in the last 168 hours. ?Lipid Profile: ?No results for input(s): CHOL, HDL, LDLCALC, TRIG, CHOLHDL, LDLDIRECT in the last 72 hours. ?Thyroid Function Tests: ?No results for input(s): TSH, T4TOTAL, FREET4, T3FREE, THYROIDAB in the last 72 hours. ?Anemia Panel: ?No results for input(s): VITAMINB12, FOLATE, FERRITIN, TIBC, IRON, RETICCTPCT in the last 72 hours. ?Urine analysis: ?   ?Component Value Date/Time  ? COLORURINE YELLOW 10/17/2019 1429  ? APPEARANCEUR CLOUDY (A) 10/17/2019 1429  ? APPEARANCEUR Cloudy 04/07/2014 2005  ? LABSPEC 1.010 10/17/2019 1429  ? LABSPEC 1.006 04/07/2014 2005  ? PHURINE 6.0 10/17/2019 1429  ? GLUCOSEU NEGATIVE 10/17/2019 1429  ? GLUCOSEU Negative 04/07/2014 2005  ? HGBUR SMALL (A) 10/17/2019 1429  ? BILIRUBINUR neg 03/26/2020 1109  ? BILIRUBINUR Negative 04/07/2014 2005  ? [place] NEGATIVE 10/17/2019 1429  ? PROTEINUR Negative  03/26/2020 1109  ? PROTEINUR NEGATIVE 10/17/2019 1429  ? UROBILINOGEN 2.0 (A) 03/26/2020 1109  ? NITRITE pos 03/26/2020 1109  ? NITRITE POSITIVE (A) 10/17/2019 1429  ? LEUKOCYTESUR Moderate (2+) (A) 03/26/2020 1109  ? LEUKOCYTESUR LARGE (A) 10/17/2019 1429  ? LEUKOCYTESUR 3+ 04/07/2014 2005  ? ?Sepsis Labs: ?@LABRCNTIP (procalcitonin:4,lacticidven:4) ? ?) ?Recent Results (from the past 240 hour(s))  ?Resp Panel by RT-PCR (Flu A&B, Covid) Nasopharyngeal Swab     Status: None  ? Collection Time: 10/25/21  9:15 PM  ? Specimen: Nasopharyngeal Swab; Nasopharyngeal(NP) swabs in vial transport medium  ?Result Value Ref Range Status  ? SARS Coronavirus 2 by RT PCR NEGATIVE NEGATIVE Final  ?  Comment: (NOTE) ?SARS-CoV-2 target nucleic acids are NOT DETECTED. ? ?The SARS-CoV-2 RNA is generally detectable in upper respiratory ?specimens during the acute phase of infection. The lowest ?concentration of SARS-CoV-2 viral copies this assay can detect is ?138 copies/mL. A negative result does not preclude SARS-Cov-2 ?infection and should  not be used as the sole basis for treatment or ?other patient management decisions. A negative result may occur with  ?improper specimen collection/handling, submission of specimen other ?than nasopharyngeal swab, presence of viral mutation(s) within the ?areas targeted by this assay, and inadequate number of viral ?copies(<138 copies/mL). A negative result must be combined with ?clinical observations, patient history, and epidemiological ?information. The expected result is Negative. ? ?Fact Sheet for Patients:  ?EntrepreneurPulse.com.au ? ?Fact Sheet for Healthcare Providers:  ?IncredibleEmployment.be ? ?This test is no t yet approved or cleared by the [place] FDA and  ?has been authorized for detection and/or diagnosis of SARS-CoV-2 by ?FDA under an Emergency Use Authorization (EUA). This EUA will remain  ?in effect (meaning this test can be used) for the  duration of the ?COVID-19 declaration under Section 564(b)(1) of the Act, 21 ?U.S.C.section 360bbb-3(b)(1), unless the authorization is terminated  ?or revoked sooner.  ? ? ?  ? Influenza A by PCR NEGATIVE NEGATIVE Final

## 2021-10-28 NOTE — Evaluation (Signed)
Occupational Therapy Evaluation and Discharge ?Patient Details ?Name: Daisy [name] ?MRN: [id number] ?DOB: Oct 17, 1950 ?Today's Date: 10/28/2021 ? ? ?History of Present Illness 70 y/o female presented to ED on 5/12 for SOB with apnea alarm on BiPAP at home. Admitted for acute respiratory failure. CXR showed pulmonary vascular congestion and L pleural effusion. PMH: CHF, COPD, HTN, PAD, CKD  ? ?Clinical Impression ?  ?Pt walks with a RW in her home. She uses a rollator when she leaves her home, but only goes out for MD appointments. Pt's daughter provides excellent care of pt and assists with tub transfers, LB ADLs when needed and all IADLs. Pt has all necessary DME and AE. She is knowledgeable in energy conservation strategies. Educated pt in pursed lip breathing during exertion, returned demonstration, but states she "forgets to breathe" sometimes. No acute OT needs, recommend ADLs with nursing staff.  ?   ? ?Recommendations for follow up therapy are one component of a multi-disciplinary discharge planning process, led by the attending physician.  Recommendations may be updated based on patient status, additional functional criteria and insurance authorization.  ? ?Follow Up Recommendations ? No OT follow up  ?  ?Assistance Recommended at Discharge Frequent or constant Supervision/Assistance  ?Patient can return home with the following A little help with walking and/or transfers;A lot of help with bathing/dressing/bathroom;Assistance with cooking/housework;Assist for transportation;Help with stairs or ramp for entrance ? ?  ?Functional Status Assessment ? Patient has had a recent decline in their functional status and demonstrates the ability to make significant improvements in function in a reasonable and predictable amount of time.  ?Equipment Recommendations ? None recommended by OT  ?  ?Recommendations for Other Services   ? ? ?  ?Precautions / Restrictions Precautions ?Precautions: Fall ?Precaution  Comments: watch O2  ? ?  ? ?Mobility Bed Mobility ?  ?  ?  ?  ?  ?  ?  ?General bed mobility comments: up in recliner on arrival ?  ? ?Transfers ?Overall transfer level: Needs assistance ?Equipment used: Rolling walker (2 wheels) ?Transfers: Sit to/from Stand ?Sit to Stand: Supervision ?  ?  ?  ?  ?  ?  ?  ? ?  ?Balance Overall balance assessment: Needs assistance ?Sitting-balance support: No upper extremity supported, Feet supported ?Sitting balance-Leahy Scale: Good ?  ?  ?  ?Standing balance-Leahy Scale: Poor ?Standing balance comment: reliant on UE support ?  ?  ?  ?  ?  ?  ?  ?  ?  ?  ?  ?   ? ?ADL either performed or assessed with clinical judgement  ? ?ADL Overall ADL's : Needs assistance/impaired ?Eating/Feeding: Independent;Sitting ?  ?Grooming: Set up;Sitting ?  ?Upper Body Bathing: Set up;Sitting ?  ?Lower Body Bathing: Moderate assistance;Sit to/from stand ?  ?Upper Body Dressing : Set up;Sitting ?  ?Lower Body Dressing: Moderate assistance;Sit to/from stand ?  ?Toilet Transfer: Min guard;Ambulation;Rolling walker (2 wheels);BSC/3in1 ?  ?Toileting- Clothing Manipulation and Hygiene: Set up;Sitting/lateral lean ?  ?  ?  ?Functional mobility during ADLs: Min guard;Rolling walker (2 wheels) ?   ? ? ? ?Vision Patient Visual Report: Central vision impairment ?Additional Comments: pt with macular degeneration  ?   ?Perception   ?  ?Praxis   ?  ? ?Pertinent Vitals/Pain Pain Assessment ?Pain Assessment: Faces ?Faces Pain Scale: Hurts little more ?Pain Location: back ?Pain Descriptors / Indicators: Guarding ?Pain Intervention(s): Repositioned, Monitored during session  ? ? ? ?Hand Dominance Right ?  ?Extremity/Trunk  Assessment Upper Extremity Assessment ?Upper Extremity Assessment: Overall WFL for tasks assessed ?  ?Lower Extremity Assessment ?Lower Extremity Assessment: Defer to PT evaluation ?  ?Cervical / Trunk Assessment ?Cervical / Trunk Assessment: Other exceptions;Kyphotic (chronic back pain) ?   ?Communication Communication ?Communication: No difficulties ?  ?Cognition Arousal/Alertness: Awake/alert ?Behavior During Therapy: [facility] for tasks assessed/performed ?Overall Cognitive Status: Within Functional Limits for tasks assessed ?  ?  ?  ?  ?  ?  ?  ?  ?  ?  ?  ?  ?  ?  ?  ?  ?  ?  ?  ?General Comments    ? ?  ?Exercises   ?  ?Shoulder Instructions    ? ? ?Home Living Family/patient expects to be discharged to:: Private residence ?Living Arrangements: Children ?Available Help at Discharge: Family;Available 24 hours/day ?Type of Home: House ?Home Access: Stairs to enter ?  ?  ?Home Layout: One level ?  ?  ?Bathroom Shower/Tub: Tub/shower unit ?  ?Bathroom Toilet: Standard ?  ?  ?Home Equipment: Conservation officer, nature (2 wheels);Rollator (4 wheels);Tub bench;Hand held shower head;Adaptive equipment;Other (comment) (lift chair) ?Adaptive Equipment: Reacher;Other (Comment) (toilet aide) ?  ?  ? ?  ?Prior Functioning/Environment Prior Level of Function : Needs assist ?  ?  ?  ?  ?  ?  ?Mobility Comments: uses RW for household mobility. Does not ambulate in community. Uses w/c at [place] ?ADLs Comments: daughter assists with getting in/out of tub, LB dressing as needed and all IADLs ?  ? ?  ?  ?OT Problem List: Impaired balance (sitting and/or standing);Decreased activity tolerance ?  ?   ?OT Treatment/Interventions:    ?  ?OT Goals(Current goals can be found in the care plan section)    ?OT Frequency:   ?  ? ?Co-evaluation   ?  ?  ?  ?  ? ?  ?AM-PAC OT "6 Clicks" Daily Activity     ?Outcome Measure Help from another person eating meals?: None ?Help from another person taking care of personal grooming?: A Little ?Help from another person toileting, which includes using toliet, bedpan, or urinal?: A Little ?Help from another person bathing (including washing, rinsing, drying)?: A Lot ?Help from another person to put on and taking off regular upper body clothing?: A Little ?Help from another person to put on and  taking off regular lower body clothing?: A Lot ?6 Click Score: 17 ?  ?End of Session Equipment Utilized During Treatment: Rolling walker (2 wheels);Oxygen (3L) ? ?Activity Tolerance: Patient tolerated treatment well ?Patient left: in chair;with call bell/phone within reach ? ?OT Visit Diagnosis: Other abnormalities of gait and mobility (R26.89)  ?              ?Time: 2951-8841 ?OT Time Calculation (min): 33 min ?Charges:  OT General Charges ?$OT Visit: 1 Visit ?OT Evaluation ?$OT Eval Moderate Complexity: 1 Mod ?OT Treatments ?$Self Care/Home Management : 8-22 mins ? ?Nestor Lewandowsky, OTR/L ?Acute Rehabilitation Services ?Pager: [phone] ?Office: [phone]  ? ?Daisy [name] ?10/28/2021, 11:45 AM ?

## 2021-10-29 ENCOUNTER — Other Ambulatory Visit (HOSPITAL_COMMUNITY): Payer: Self-pay

## 2021-10-29 DIAGNOSIS — I509 Heart failure, unspecified: Secondary | ICD-10-CM | POA: Diagnosis not present

## 2021-10-29 DIAGNOSIS — J9601 Acute respiratory failure with hypoxia: Secondary | ICD-10-CM | POA: Diagnosis not present

## 2021-10-29 LAB — BASIC METABOLIC PANEL
Anion gap: 11 (ref 5–15)
BUN: 19 mg/dL (ref 8–23)
CO2: 30 mmol/L (ref 22–32)
Calcium: 9.2 mg/dL (ref 8.9–10.3)
Chloride: 98 mmol/L (ref 98–111)
Creatinine, Ser: 1.66 mg/dL — ABNORMAL HIGH (ref 0.44–1.00)
GFR, Estimated: 33 mL/min — ABNORMAL LOW (ref >60)
Glucose, Bld: 148 mg/dL — ABNORMAL HIGH (ref 70–99)
Potassium: 3.6 mmol/L (ref 3.5–5.1)
Sodium: 139 mmol/L (ref 135–145)

## 2021-10-29 MED ORDER — PREDNISONE 20 MG PO TABS
20.0000 mg | ORAL_TABLET | Freq: Every day | ORAL | 0 refills | Status: DC
Start: 2021-10-29 — End: 2021-11-19
  Filled 2021-10-29: qty 8, 4d supply, fill #0

## 2021-10-29 MED ORDER — TORSEMIDE 10 MG PO TABS: 20.0000 mg | ORAL_TABLET | Freq: Every day | ORAL | 0 refills | Status: DC

## 2021-10-29 MED ORDER — CEFDINIR 300 MG PO CAPS
300.0000 mg | ORAL_CAPSULE | Freq: Two times a day (BID) | ORAL | 0 refills | Status: AC
  Filled 2021-10-29: qty 6, 3d supply, fill #0

## 2021-10-29 NOTE — Care Management Important Message (Signed)
Important Message ? ?Patient Details  ?Name: Daisy [name] ?MRN: [id number] ?Date of Birth: Dec 21, 1950 ? ? ?Medicare Important Message Given:  Yes ? ? ? ? ?Meili Kleckley ?10/29/2021, 3:00 PM ?

## 2021-10-29 NOTE — TOC Transition Note (Addendum)
Transition of Care (TOC) - CM/SW Discharge Note ? ? ?Patient Details  ?Name: Daisy [name] ?MRN: [id number] ?Date of Birth: Dec 24, 1950 ? ?Transition of Care (TOC) CM/SW Contact:  ?Carles Collet, RN ?Phone Number: ?10/29/2021, 10:11 AM ? ? ?Clinical Narrative:   Patient admitted from home with daughter who she states works in the health care care field Friday Saturdays and and Sundays. She states that daughter will be able to continue support at DC.  ?She confirms that she has all needed DME at home. She has [employer] and oxygen through [employer], confirmed with [employer] that she has a Buyer, retail at home. [employer] liaison aware to watch for new ambulatory sat note as one was requested from RN by CM this AM.  ?Patient states that her daughter will provide transportation home. She will call her and remind her to bring O2 tank for ride home. ?Patient declined [facility] services, states she has had it before [facility] in 2021) and she knows what to do and can have her daughter help her with it ? ? ? ?Final next level of care: Home/Self Care ?Barriers to Discharge: No Barriers Identified ? ? ?Patient Goals and CMS Choice ?Patient states their goals for this hospitalization and ongoing recovery are:: to go home ?  ?Choice offered to / list presented to : Patient ? ?Discharge Placement ?  ?           ?  ?  ?  ?  ? ?Discharge Plan and Services ?  ?  ?           ?DME Arranged: N/A ?  ?  ?  ?  ?HH Arranged: Refused HH ?  ?  ?  ?  ? ?Social Determinants of Health (SDOH) Interventions ?  ? ? ?Readmission Risk Interventions ? ?  10/29/2021  ? 10:10 AM  ?Readmission Risk Prevention Plan  ?Transportation Screening Complete  ?PCP or Specialist Appt within 5-7 Days Complete  ?Home Care Screening Complete  ?Medication Review (RN CM) Referral to Pharmacy  ? ? ? ? ? ?

## 2021-10-29 NOTE — Progress Notes (Signed)
Heart Failure Navigator Progress Note ? ?Assessed for Heart & Vascular TOC clinic readiness.  ?Patient does not meet criteria due to a [facility] cardiology patient. .  ? ?  ? ?Earnestine Leys, BSN, RN ?Heart Failure Nurse Navigator ?Secure Chat Only   ?

## 2021-10-29 NOTE — Progress Notes (Incomplete)
Heart Failure Stewardship Pharmacist Progress Note ? ? ?PCP: Lesleigh Noe, MD ?PCP-Cardiologist: None  ? ? ?HPI:  ?*** ? ?Current HF Medications: ?{HF MEDS ZRAQTMA:26333} ? ?Prior to admission HF Medications: ?{HF MEDS PTA:26664} ? ?Pertinent Lab Values: ?Serum creatinine ***, BUN ***, Potassium ***, Sodium ***, BNP ***, Magnesium ***, A1c ***, Digoxin ***  ? ?Vital Signs: ?Weight: *** lbs (admission weight: *** lbs) ?Blood pressure: ***  ?Heart rate: ***  ?I/O: -***L yesterday; net -***L ? ?Medication Assistance / Insurance Benefits Check: ?Does the patient have prescription insurance?  {Yes/No/Pending:24180} ?Type of insurance plan: *** ? ?Does the patient qualify for medication assistance through manufacturers or grants?   {Yes/No/Pending:24180} ?Eligible grants and/or patient assistance programs: *** ?Medication assistance applications in progress: ***  ?Medication assistance applications approved: *** ?Approved medication assistance renewals will be completed by: *** ? ?Outpatient Pharmacy:  ?Prior to admission outpatient pharmacy: *** ?Is the patient willing to use [place] pharmacy at discharge? {Yes/No/Pending:24180} ?Is the patient willing to transition their outpatient pharmacy to utilize a [facility] outpatient pharmacy?   {Yes/No/Pending:24180} ?  ? ?Assessment: ?1. Acute on ***chronic ***systolic CHF (LVEF ***%), due to ***. NYHA class *** symptoms. ?-  ?  ?Plan: ?1) Medication changes recommended at this time: ?- ? ?2) Patient assistance: ?- ? ?3)  Education  ?- To be completed prior to discharge ? ?Kerby Nora, PharmD, BCPS ?Heart Failure Stewardship Pharmacist ?Phone [phone] ? ? ?

## 2021-10-29 NOTE — Progress Notes (Signed)
IV removed. Pharmacy delivered medications. Patient discharged home with daughter.  ? ?

## 2021-10-29 NOTE — Progress Notes (Signed)
SATURATION QUALIFICATIONS: (This note is used to comply with regulatory documentation for home oxygen) ? ?Patient Saturations on Room Air at Rest = 85% ? ?Patient Saturations on Room Air while Ambulating = 82% ? ?Patient Saturations on 3 Liters of oxygen while Ambulating = 89% ? ?Please briefly explain why patient needs home oxygen: ?Patient with CHF and pneumonia and new daily oxygen demand.  ?

## 2021-10-29 NOTE — Progress Notes (Signed)
Physical Therapy Treatment ?Patient Details ?Name: Daisy [name] ?MRN: [id number] ?DOB: 14-May-1951 ?Today's Date: 10/29/2021 ? ? ?History of Present Illness 71 y/o female presented to ED on 5/12 for SOB with apnea alarm on BiPAP at home. Admitted for acute respiratory failure. CXR showed pulmonary vascular congestion and L pleural effusion. PMH: CHF, COPD, HTN, PAD, CKD ? ?  ?PT Comments  ? ? Pt progressing towards all goals. Pt amb short household distances PTA. Pt amb 3 bouts today with RW however pt with decreased activity tolerance, SOB, and "weak legs". Pt with antalgia, however pt with chronic back and knee pain. See below O2 sats. Acute PT to cont to follow. ? ?SATURATION QUALIFICATIONS: (This note is used to comply with regulatory documentation for home oxygen) ? ?Patient Saturations on Room Air at Rest = 85% ? ?Patient Saturations on Room Air while Ambulating = 81% ? ?Patient Saturations on 3 Liters of oxygen while Ambulating = 85% ? ?Please briefly explain why patient needs home oxygen: pt unable to maintain SpO2 >88% on RA ? ?   ?Recommendations for follow up therapy are one component of a multi-disciplinary discharge planning process, led by the attending physician.  Recommendations may be updated based on patient status, additional functional criteria and insurance authorization. ? ?Follow Up Recommendations ? Home health PT ?  ?  ?Assistance Recommended at Discharge Intermittent Supervision/Assistance  ?Patient can return home with the following   ?  ?Equipment Recommendations ? None recommended by PT  ?  ?Recommendations for Other Services OT consult ? ? ?  ?Precautions / Restrictions Precautions ?Precautions: Fall ?Precaution Comments: watch O2 ?Restrictions ?Weight Bearing Restrictions: No  ?  ? ?Mobility ? Bed Mobility ?  ?  ?  ?  ?  ?  ?  ?General bed mobility comments: up in recliner on arrival ?  ? ?Transfers ?Overall transfer level: Needs assistance ?Equipment used: Rolling walker (2  wheels) ?Transfers: Sit to/from Stand ?Sit to Stand: Supervision ?  ?  ?  ?  ?  ?General transfer comment: pt uses momentum to rock up into standing using bilat hand rests to push up from ?  ? ?Ambulation/Gait ?Ambulation/Gait assistance: Min assist, +2 safety/equipment (2nd person for chair follow and for O2 tank/line management) ?Gait Distance (Feet): 10 Feet (x1, 15x1, 10x1) ?Assistive device: Rolling walker (2 wheels) ?Gait Pattern/deviations: Step-through pattern, Decreased stride length, Trunk flexed ?Gait velocity: decreased ?  ?  ?General Gait Details: pt very dependent on UEs on RW, SpO2 decreased to 83% on 3Lo2 via [place], pt only amb short distances in home PTA, pt amb 3 bouts required seated rest break ? ? ?Stairs ?  ?  ?  ?  ?  ? ? ?Wheelchair Mobility ?  ? ?Modified Rankin (Stroke Patients Only) ?  ? ? ?  ?Balance Overall balance assessment: Needs assistance ?Sitting-balance support: No upper extremity supported, Feet supported ?Sitting balance-Leahy Scale: Good ?  ?  ?Standing balance support: Bilateral upper extremity supported, Reliant on assistive device for balance ?Standing balance-Leahy Scale: Poor ?Standing balance comment: reliant on UE support ?  ?  ?  ?  ?  ?  ?  ?  ?  ?  ?  ?  ? ?  ?Cognition Arousal/Alertness: Awake/alert ?Behavior During Therapy: [facility] for tasks assessed/performed ?Overall Cognitive Status: Within Functional Limits for tasks assessed ?  ?  ?  ?  ?  ?  ?  ?  ?  ?  ?  ?  ?  ?  ?  ?  ?  ?  ?  ? ?  ?  Exercises   ? ?  ?General Comments General comments (skin integrity, edema, etc.): see O2 section in note, pt on 3lO2 via [place], 95% at rest, 83% during amb ?  ?  ? ?Pertinent Vitals/Pain Pain Assessment ?Pain Assessment: 0-10 ?Pain Score: 6  ?Pain Location: back, knees, chronic ?Pain Descriptors / Indicators: Sore ?Pain Intervention(s): Monitored during session  ? ? ?Home Living   ?  ?  ?  ?  ?  ?  ?  ?  ?  ?   ?  ?Prior Function    ?  ?  ?   ? ?PT Goals (current goals can now be found in  the care plan section) Acute Rehab PT Goals ?Patient Stated Goal: to go home ?PT Goal Formulation: With patient ?Time For Goal Achievement: 11/10/21 ?Potential to Achieve Goals: Good ?Progress towards PT goals: Progressing toward goals ? ?  ?Frequency ? ? ? Min 3X/week ? ? ? ?  ?PT Plan Current plan remains appropriate  ? ? ?Co-evaluation   ?  ?  ?  ?  ? ?  ?AM-PAC PT "6 Clicks" Mobility   ?Outcome Measure ? Help needed turning from your back to your side while in a flat bed without using bedrails?: A Little ?Help needed moving from lying on your back to sitting on the side of a flat bed without using bedrails?: A Little ?Help needed moving to and from a bed to a chair (including a wheelchair)?: A Little ?Help needed standing up from a chair using your arms (e.g., wheelchair or bedside chair)?: A Little ?Help needed to walk in hospital room?: A Little ?Help needed climbing 3-5 steps with a railing? : A Little ?6 Click Score: 18 ? ?  ?End of Session Equipment Utilized During Treatment: Oxygen (3Lo2 via [place]) ?Activity Tolerance: Patient tolerated treatment well ?Patient left: in chair;with call bell/phone within reach ?Nurse Communication: Mobility status ?PT Visit Diagnosis: Muscle weakness (generalized) (M62.81);Unsteadiness on feet (R26.81) ?  ? ? ?Time: 7741-2878 ?PT Time Calculation (min) (ACUTE ONLY): 24 min ? ?Charges:  $Gait Training: 23-37 mins          ?          ?Kittie Plater, PT, DPT ?Acute Rehabilitation Services ?Secure chat preferred ?Office #: [phone] ? ? ? ?Andi Layfield M Jacarri Gesner ?10/29/2021, 10:45 AM ? ?

## 2021-10-29 NOTE — Discharge Summary (Signed)
Physician Discharge Summary  ?Llana Deshazo GUY:[id number] DOB: 1951-05-10 DOA: 10/25/2021 ? ?PCP: Lesleigh Noe, MD ? ?Admit date: 10/25/2021 ?Discharge date: 10/29/2021 ? ?Time spent: 35 minutes ? ?Recommendations for Outpatient Follow-up:  ?PCP Dr. Einar Pheasant in 1 to 2 weeks, please check BMP at follow-up ?Cardiology Dr. Einar Gip in 2 weeks ?Pulmonary Dr. [name] in 1 month ?Home health PT, RN, home O2 set up for daytime as well, previously was on at bedtime only ? ? ?Discharge Diagnoses:  ?Active Problems: ?  Acute hypoxemic respiratory failure ([place]) ?  Acute on chronic diastolic CHF (congestive heart failure) ([place]) ?Community-acquired pneumonia ?  COPD with chronic bronchitis and emphysema ([place]) ?  Obesity hypoventilation syndrome ([place]) ?  CKD (chronic kidney disease), stage III ([place]) ?  Essential hypertension ?  Generalized anxiety disorder ?  PAD (peripheral artery disease) ([place]) ?  HLD (hyperlipidemia) ?  Acute on chronic congestive heart failure ([place]) ? ? ?Discharge Condition: Stable ? ?Diet recommendation: Low-sodium, heart healthy ? ?Filed Weights  ? 10/25/21 1655 10/29/21 0500  ?Weight: (!) 154.2 kg (!) 145.4 kg  ? ? ?History of present illness:  ?71 y.o. female with morbid obesity, BMI 53, chronic diastolic CHF, COPD, CKD 3a, chronic respiratory failure, OSA/OHS on nightly BiPAP, on home O2 as needed  presented to her pulmonology office today with low oxygen. This morning she was short of breath and family checked oxygen and it was 81%.  ?-, Patient reported having a chest cold, mild cough productive of clear phlegm, reports low back pain and some dyspnea on exertion. ?-In the ED vitals were stable, O2 sats were 91% on 2 L, creatinine was 1.85, BNP 187, chest x-ray noted enlarged cardiac silhouette with pulmonary vascular congestion, left basilar opacity consistent with left pleural effusion and associated atelectasis versus left lower lobe consolidation ?-Admitted, started on Lasix and antibiotics ? ?Hospital  Course:  ? ?Acute respiratory failure with hypoxia and hypercapnia (HCC) ?- secondary to acute diastolic CHF/cor pulmonale and pneumonia ?-Diuresed with IV Lasix, she is 7 L negative, weight down 20 pounds ?-Some productive cough with questionable pneumonia versus atelectasis on chest x-ray and significantly elevated procalcitonin, was treated with IV ceftriaxone and azithromycin as well, transitioned to oral cefdinir at discharge for 3 more days, along with prednisone taper, albuterol to be continued ?-Oxygen weaned down from 6 L on admission to 2 to 3 L at discharge, previously was using nightly oxygen only with her trilogy/BiPAP machine, now advised to use this continuously, follow-up with her pulmonologist and PCP recommended ?-Home health PT and RN follow-up also set up ?  ?Acute on chronic diastolic CHF (congestive heart failure) ([place]) ?-Continue IV Lasix as noted above,  ?-Repeat echo ordered on admission, will follow-up ?-last echo 10/2019: EF 60-65% with normal LVF. Indeterminate DD, ? RHF.  ?-Continue Farxiga ?-Volume status improved, transitioned to torsemide 20 Mg daily at discharge, close follow-up with Dr. Einar Gip, needs BMP in 1 week ?  ?Community-acquired pneumonia with mild COPD exacerbation ?-Antibiotics as noted above ?-Treated with IV steroids as well on account of persistent wheezing, clinically improving transitioned to prednisone taper for 4 days and antibiotics for 3 more days ?-She is not on maintenance meds for COPD, advised close follow-up with pulmonary ? ?COPD ?-As noted above, follow-up with Dr. [name] ?-Continue albuterol ?  ?Low back pain ?-X-rays with DJD, Voltaren gel ?  ?Obesity hypoventilation syndrome ([place]) ?Unsure if contributing to her symptoms, ?Continue bipap at night, uses trilogy at home ?  ?CKD (  chronic kidney disease), stage III ([place]) ?Baseline creatinine of 1.6-1.7 ?Stable,  ?  ?Essential hypertension ?Well controlled  ?Continue toprol-xl  ?  ?Generalized anxiety  disorder ?Continue prozac  ?  ?PAD (peripheral artery disease) ([place]) ?S/p stent ?Continue ASA and pletal  ?  ?HLD (hyperlipidemia) ?Continue nexlizet  ?  ?Morbid obesity, BMI 53.2 ?-Poor prognostic factor ?  ? ?Discharge Exam: ?Vitals:  ? 10/29/21 0850 10/29/21 1315  ?BP:  134/71  ?Pulse:  100  ?Resp:  20  ?Temp:  97.8 ?F (36.6 ?C)  ?SpO2: 98% 92%  ? ?General exam: Pleasant obese female sitting up in the recliner, AAOx3, no distress ?HEENT: Positive JVD ?CVS: S1-S2, regular rhythm ?Lungs: Bilateral expiratory wheezes and scattered rhonchi ?Abdomen: Soft, nontender, bowel sounds present ?Extremities: No edema  ?Skin: No rashes ?Psychiatry:  Mood & affect appropriate.  ? ? ?Discharge Instructions ? ? ?Discharge Instructions   ? ? Diet - low sodium heart healthy   Complete by: As directed ?  ? Increase activity slowly   Complete by: As directed ?  ? ?  ? ?Allergies as of 10/29/2021   ? ?   Reactions  ? Elemental Sulfur Hives  ? Levaquin [levofloxacin] Other (See Comments)  ? Body aches  ? Statins   ? ?  ? ?  ?Medication List  ?  ? ?STOP taking these medications   ? ?losartan 25 MG tablet ?Commonly known as: COZAAR ?  ? ?  ? ?TAKE these medications   ? ?acetaminophen 325 MG tablet ?Commonly known as: TYLENOL ?Take 2 tablets (650 mg total) by mouth every 4 (four) hours as needed for headache or mild pain. ?  ?albuterol 108 (90 Base) MCG/ACT inhaler ?Commonly known as: VENTOLIN HFA ?TAKE 2 PUFFS BY MOUTH EVERY 6 HOURS AS NEEDED FOR WHEEZE OR SHORTNESS OF BREATH ?What changed: See the new instructions. ?  ?aspirin 81 MG chewable tablet ?Chew 81 mg by mouth daily. ?  ?cefdinir 300 MG capsule ?Commonly known as: OMNICEF ?Take 1 capsule (300 mg total) by mouth 2 (two) times daily for 3 days. ?  ?cholecalciferol 25 MCG (1000 UNIT) tablet ?Commonly known as: VITAMIN D3 ?Take 1,000 Units by mouth daily. ?  ?cilostazol 100 MG tablet ?Commonly known as: PLETAL ?TAKE 1 TABLET BY MOUTH TWICE A DAY ?  ?cyanocobalamin 1000 MCG  tablet ?Take 1,000 mcg by mouth daily. ?  ?dapagliflozin propanediol 5 MG Tabs tablet ?Commonly known as: FARXIGA ?Take 5 mg by mouth daily. ?  ?FLUoxetine 20 MG capsule ?Commonly known as: PROZAC ?TAKE 1 CAPSULE BY MOUTH EVERY DAY ?What changed: how much to take ?  ?Klor-Con M20 20 MEQ tablet ?Generic drug: potassium chloride SA ?TAKE 1 TABLET BY MOUTH EVERY DAY ?What changed: how much to take ?  ?metoprolol succinate 25 MG 24 hr tablet ?Commonly known as: TOPROL-XL ?Take 0.5 tablets (12.5 mg total) by mouth daily. ?  ?Nexlizet 180-10 MG Tabs ?Generic drug: Bempedoic Acid-Ezetimibe ?TAKE 1 TABLET BY MOUTH EVERY DAY ?What changed: when to take this ?  ?pantoprazole 20 MG tablet ?Commonly known as: PROTONIX ?Take 1 tablet (20 mg total) by mouth daily. ?  ?predniSONE 20 MG tablet ?Commonly known as: DELTASONE ?Take 2 tablets (40mg ) daily for 2 days then 1 tablet (20mg ) daily for 2 days then STOP ?  ?torsemide 10 MG tablet ?Commonly known as: DEMADEX ?Take 2 tablets (20 mg total) by mouth daily. ?What changed:  ?how much to take ?when to take this ?  ? ?  ? ?  ?  ? ? ?  ?  Durable Medical Equipment  ?(From admission, onward)  ?  ? ? ?  ? ?  Start     Ordered  ? 10/29/21 1101  For home use only DME oxygen  Once       ?Question Answer Comment  ?Length of Need 6 Months   ?Mode or (Route) Nasal cannula   ?Liters per Minute 3   ?Frequency Continuous (stationary and portable oxygen unit needed)   ?Oxygen conserving device Yes   ?Oxygen delivery system Gas   ?  ? 10/29/21 1100  ? ?  ?  ? ?  ? ?Allergies  ?Allergen Reactions  ? Elemental Sulfur Hives  ? Levaquin [Levofloxacin] Other (See Comments)  ?  Body aches  ? Statins   ? ? Follow-up Information   ? ? Rigoberto Noel, MD. Schedule an appointment as soon as possible for a visit in 3 week(s).   ?Specialty: Pulmonary Disease ?Contact information: ?[address] ?[address] ?[place] [address] ?[phone] ? ? ?  ?  ? ?  ?  ? ?  ? ? ? ?The results of significant diagnostics  from this hospitalization (including imaging, microbiology, ancillary and laboratory) are listed below for reference.   ? ?Significant Diagnostic Studies: ?DG Chest 2 View ? ?Result Date: 10/25/2021 ?CLINICAL DATA:  [place]

## 2021-10-30 ENCOUNTER — Telehealth: Payer: Self-pay

## 2021-10-30 NOTE — Telephone Encounter (Signed)
Transition Care Management Follow-up Telephone Call ?Date of discharge and from where: [facility] 10-29-21 Dx: acute hypoxemic respiratory failure  ?How have you been since you were released from the hospital? Doing ok  ?Any questions or concerns? Yes- pt refused home health but dtr states that she needs it- is not able to get in the house with the stairs  ? ?Items Reviewed: ?Did the pt receive and understand the discharge instructions provided? Yes  ?Medications obtained and verified? Yes  ?Other? No  ?Any new allergies since your discharge? No  ?Dietary orders reviewed? Yes ?Do you have support at home? Yes  ? ?Home Care and Equipment/Supplies: ?Were home health services ordered? Yes- PT - RN - pt refused  ?If so, what is the name of the agency? na  ?Has the agency set up a time to come to the patient's home? not applicable ?Were any new equipment or medical supplies ordered?  No ?What is the name of the medical supply agency? na ?Were you able to get the supplies/equipment? not applicable ?Do you have any questions related to the use of the equipment or supplies? No ? ?Functional Questionnaire: (I = Independent and D = Dependent) ?ADLs: I ? ?Bathing/Dressing- D ? ?Meal Prep- D ? ?Eating- I ? ?Maintaining continence- I ? ?Transferring/Ambulation- Daisy [name] ? ?Managing Meds- I ? ?Follow up appointments reviewed: ? ?PCP Hospital f/u appt confirmed? Yes  Scheduled to see Daisy [name] on 11-04-21 @ 240pm. ?[facility] f/u appt confirmed? No  - pt knows to make fu appt with pulmonary and cardiology . ?Are transportation arrangements needed? No  ?If their condition worsens, is the pt aware to call PCP or go to the Emergency Dept.? Yes ?Was the patient provided with contact information for the PCP's office or ED? Yes ?Was to pt encouraged to call back with questions or concerns? Yes  ?

## 2021-10-30 NOTE — Telephone Encounter (Signed)
Please check back with patient on Friday to see if she changes mind about [place] so an order can be placed prior to appointment ?

## 2021-10-31 ENCOUNTER — Telehealth: Payer: Self-pay | Admitting: Family Medicine

## 2021-10-31 NOTE — Telephone Encounter (Signed)
I spoke with Dianne nurse with [facility] requesting verbal orders for [facility] for Skilled nursing 1 x a wk for 9 wks;  Verbal orders for [facility] PT, OT, and social worker evaluation; Also noted class I drug reaction between Pletal and Prozac.  Pt recently in hospital for pneumonia, CHF overload and trouble getting up and down stairs.Dianne said pt lost 20 lbs while in the hospital. [facility] request cb after Dr Einar Pheasant reviews the note. Sending the note to Dr Einar Pheasant.

## 2021-10-31 NOTE — Telephone Encounter (Signed)
Home Health verbal orders Caller Name: [place] Name: [place] number: [phone]  Requesting: Skilled nursing  Reason:  Was just in the hospital   Frequency: Once a week   Please forward to [facility] pool or providers CMA

## 2021-11-01 ENCOUNTER — Telehealth: Payer: Self-pay | Admitting: Family Medicine

## 2021-11-01 MED ORDER — CILOSTAZOL 50 MG PO TABS: 50.0000 mg | ORAL_TABLET | Freq: Two times a day (BID) | ORAL | 0 refills | Status: DC

## 2021-11-01 NOTE — Telephone Encounter (Signed)
Pt is getting [facility] services now.

## 2021-11-01 NOTE — Telephone Encounter (Signed)
Verbal orders relayed to [place] for requested services, per Diane.

## 2021-11-01 NOTE — Telephone Encounter (Signed)
Agree with HH orders as requested

## 2021-11-01 NOTE — Telephone Encounter (Signed)
Spoke to Diane and relayed verbal orders as requested, per Dr. Einar Pheasant. Also, told Diane about medication changes.

## 2021-11-01 NOTE — Telephone Encounter (Signed)
Called and spoke with patient.   They are hoping to build the ramp 5/22 and 5/23.   Discussed importance of in-person evaluation.   Discussed trying to move appointment to 11/06/2021.   Her daughter does the scheduling and is working. Asked her to have her daughter call back to reschedule to Wednesday 5/24 for in-person visit.   Routing to scheduling team to be aware.   Please attempt to call Daisy [name] later today if she has not called

## 2021-11-01 NOTE — Telephone Encounter (Signed)
Spoke to pt's daughter [name] and got the pt rescheduled for 11/06/21

## 2021-11-01 NOTE — Telephone Encounter (Signed)
Ok with [place] as requested.   Reviewed medications with her cardiologist.  At this time we will try a reduced dose of cilostazol.  I have sent in a new dose of 50 mg.  She will take this twice daily.  Okay to continue Prozac at this time.

## 2021-11-04 ENCOUNTER — Ambulatory Visit: Payer: Medicare PPO | Admitting: Family Medicine

## 2021-11-04 ENCOUNTER — Telehealth: Payer: Self-pay | Admitting: Hematology and Oncology

## 2021-11-04 NOTE — Telephone Encounter (Signed)
Rescheduled appointment per providers template. Left message.  ? ?

## 2021-11-05 ENCOUNTER — Inpatient Hospital Stay
Admission: EM | Admit: 2021-11-05 | Discharge: 2021-11-19 | DRG: 477 | Disposition: A | Discharge status: Hospice | Payer: Medicare PPO | Attending: Internal Medicine | Admitting: Internal Medicine

## 2021-11-05 ENCOUNTER — Telehealth: Payer: Self-pay | Admitting: Family Medicine

## 2021-11-05 ENCOUNTER — Other Ambulatory Visit: Payer: Self-pay

## 2021-11-05 ENCOUNTER — Emergency Department: Payer: Medicare PPO

## 2021-11-05 ENCOUNTER — Encounter: Payer: Self-pay | Admitting: Emergency Medicine

## 2021-11-05 DIAGNOSIS — I5032 Chronic diastolic (congestive) heart failure: Secondary | ICD-10-CM | POA: Diagnosis present

## 2021-11-05 DIAGNOSIS — I739 Peripheral vascular disease, unspecified: Secondary | ICD-10-CM | POA: Diagnosis present

## 2021-11-05 DIAGNOSIS — R5383 Other fatigue: Secondary | ICD-10-CM | POA: Diagnosis not present

## 2021-11-05 DIAGNOSIS — Z66 Do not resuscitate: Secondary | ICD-10-CM | POA: Diagnosis present

## 2021-11-05 DIAGNOSIS — J4489 Other specified chronic obstructive pulmonary disease: Secondary | ICD-10-CM | POA: Diagnosis present

## 2021-11-05 DIAGNOSIS — Z6841 Body Mass Index (BMI) 40.0 and over, adult: Secondary | ICD-10-CM | POA: Diagnosis not present

## 2021-11-05 DIAGNOSIS — J9622 Acute and chronic respiratory failure with hypercapnia: Secondary | ICD-10-CM | POA: Diagnosis present

## 2021-11-05 DIAGNOSIS — Z888 Allergy status to other drugs, medicaments and biological substances status: Secondary | ICD-10-CM

## 2021-11-05 DIAGNOSIS — J9621 Acute and chronic respiratory failure with hypoxia: Secondary | ICD-10-CM | POA: Diagnosis present

## 2021-11-05 DIAGNOSIS — J439 Emphysema, unspecified: Secondary | ICD-10-CM | POA: Diagnosis present

## 2021-11-05 DIAGNOSIS — Z515 Encounter for palliative care: Secondary | ICD-10-CM

## 2021-11-05 DIAGNOSIS — M549 Dorsalgia, unspecified: Secondary | ICD-10-CM | POA: Diagnosis not present

## 2021-11-05 DIAGNOSIS — F329 Major depressive disorder, single episode, unspecified: Secondary | ICD-10-CM | POA: Diagnosis present

## 2021-11-05 DIAGNOSIS — N390 Urinary tract infection, site not specified: Secondary | ICD-10-CM | POA: Diagnosis present

## 2021-11-05 DIAGNOSIS — N1831 Chronic kidney disease, stage 3a: Secondary | ICD-10-CM | POA: Diagnosis present

## 2021-11-05 DIAGNOSIS — R059 Cough, unspecified: Secondary | ICD-10-CM | POA: Diagnosis not present

## 2021-11-05 DIAGNOSIS — Z7982 Long term (current) use of aspirin: Secondary | ICD-10-CM

## 2021-11-05 DIAGNOSIS — R197 Diarrhea, unspecified: Secondary | ICD-10-CM | POA: Diagnosis not present

## 2021-11-05 DIAGNOSIS — E662 Morbid (severe) obesity with alveolar hypoventilation: Secondary | ICD-10-CM | POA: Diagnosis present

## 2021-11-05 DIAGNOSIS — M8458XA Pathological fracture in neoplastic disease, other specified site, initial encounter for fracture: Principal | ICD-10-CM | POA: Diagnosis present

## 2021-11-05 DIAGNOSIS — D72829 Elevated white blood cell count, unspecified: Secondary | ICD-10-CM | POA: Diagnosis present

## 2021-11-05 DIAGNOSIS — I10 Essential (primary) hypertension: Secondary | ICD-10-CM | POA: Diagnosis present

## 2021-11-05 DIAGNOSIS — Z8249 Family history of ischemic heart disease and other diseases of the circulatory system: Secondary | ICD-10-CM

## 2021-11-05 DIAGNOSIS — R160 Hepatomegaly, not elsewhere classified: Secondary | ICD-10-CM | POA: Diagnosis not present

## 2021-11-05 DIAGNOSIS — J90 Pleural effusion, not elsewhere classified: Secondary | ICD-10-CM | POA: Diagnosis not present

## 2021-11-05 DIAGNOSIS — I2510 Atherosclerotic heart disease of native coronary artery without angina pectoris: Secondary | ICD-10-CM | POA: Diagnosis not present

## 2021-11-05 DIAGNOSIS — Z882 Allergy status to sulfonamides status: Secondary | ICD-10-CM

## 2021-11-05 DIAGNOSIS — C782 Secondary malignant neoplasm of pleura: Secondary | ICD-10-CM | POA: Diagnosis present

## 2021-11-05 DIAGNOSIS — Z79899 Other long term (current) drug therapy: Secondary | ICD-10-CM

## 2021-11-05 DIAGNOSIS — N3000 Acute cystitis without hematuria: Secondary | ICD-10-CM | POA: Diagnosis not present

## 2021-11-05 DIAGNOSIS — R918 Other nonspecific abnormal finding of lung field: Secondary | ICD-10-CM

## 2021-11-05 DIAGNOSIS — C3492 Malignant neoplasm of unspecified part of left bronchus or lung: Secondary | ICD-10-CM | POA: Diagnosis not present

## 2021-11-05 DIAGNOSIS — Z170 Estrogen receptor positive status [ER+]: Secondary | ICD-10-CM | POA: Diagnosis not present

## 2021-11-05 DIAGNOSIS — C7971 Secondary malignant neoplasm of right adrenal gland: Secondary | ICD-10-CM | POA: Diagnosis present

## 2021-11-05 DIAGNOSIS — Z853 Personal history of malignant neoplasm of breast: Secondary | ICD-10-CM

## 2021-11-05 DIAGNOSIS — G9341 Metabolic encephalopathy: Secondary | ICD-10-CM | POA: Diagnosis present

## 2021-11-05 DIAGNOSIS — Z7401 Bed confinement status: Secondary | ICD-10-CM | POA: Diagnosis not present

## 2021-11-05 DIAGNOSIS — Z9889 Other specified postprocedural states: Secondary | ICD-10-CM | POA: Diagnosis not present

## 2021-11-05 DIAGNOSIS — S22080K Wedge compression fracture of T11-T12 vertebra, subsequent encounter for fracture with nonunion: Secondary | ICD-10-CM | POA: Diagnosis present

## 2021-11-05 DIAGNOSIS — R062 Wheezing: Secondary | ICD-10-CM | POA: Diagnosis not present

## 2021-11-05 DIAGNOSIS — F17290 Nicotine dependence, other tobacco product, uncomplicated: Secondary | ICD-10-CM | POA: Diagnosis present

## 2021-11-05 DIAGNOSIS — B965 Pseudomonas (aeruginosa) (mallei) (pseudomallei) as the cause of diseases classified elsewhere: Secondary | ICD-10-CM | POA: Diagnosis present

## 2021-11-05 DIAGNOSIS — I130 Hypertensive heart and chronic kidney disease with heart failure and stage 1 through stage 4 chronic kidney disease, or unspecified chronic kidney disease: Secondary | ICD-10-CM | POA: Diagnosis present

## 2021-11-05 DIAGNOSIS — Z87440 Personal history of urinary (tract) infections: Secondary | ICD-10-CM

## 2021-11-05 DIAGNOSIS — M8448XA Pathological fracture, other site, initial encounter for fracture: Secondary | ICD-10-CM | POA: Diagnosis not present

## 2021-11-05 DIAGNOSIS — R0902 Hypoxemia: Secondary | ICD-10-CM | POA: Diagnosis not present

## 2021-11-05 DIAGNOSIS — C7951 Secondary malignant neoplasm of bone: Secondary | ICD-10-CM | POA: Diagnosis present

## 2021-11-05 DIAGNOSIS — Z923 Personal history of irradiation: Secondary | ICD-10-CM

## 2021-11-05 DIAGNOSIS — F411 Generalized anxiety disorder: Secondary | ICD-10-CM | POA: Diagnosis present

## 2021-11-05 DIAGNOSIS — A419 Sepsis, unspecified organism: Secondary | ICD-10-CM | POA: Diagnosis not present

## 2021-11-05 DIAGNOSIS — S22089A Unspecified fracture of T11-T12 vertebra, initial encounter for closed fracture: Secondary | ICD-10-CM | POA: Diagnosis not present

## 2021-11-05 DIAGNOSIS — J449 Chronic obstructive pulmonary disease, unspecified: Secondary | ICD-10-CM | POA: Diagnosis present

## 2021-11-05 DIAGNOSIS — J189 Pneumonia, unspecified organism: Secondary | ICD-10-CM | POA: Diagnosis present

## 2021-11-05 DIAGNOSIS — K219 Gastro-esophageal reflux disease without esophagitis: Secondary | ICD-10-CM | POA: Diagnosis present

## 2021-11-05 DIAGNOSIS — N179 Acute kidney failure, unspecified: Secondary | ICD-10-CM | POA: Diagnosis present

## 2021-11-05 DIAGNOSIS — Z7189 Other specified counseling: Secondary | ICD-10-CM | POA: Diagnosis not present

## 2021-11-05 DIAGNOSIS — S22000A Wedge compression fracture of unspecified thoracic vertebra, initial encounter for closed fracture: Secondary | ICD-10-CM

## 2021-11-05 DIAGNOSIS — Z801 Family history of malignant neoplasm of trachea, bronchus and lung: Secondary | ICD-10-CM

## 2021-11-05 DIAGNOSIS — C3432 Malignant neoplasm of lower lobe, left bronchus or lung: Secondary | ICD-10-CM | POA: Diagnosis not present

## 2021-11-05 DIAGNOSIS — E785 Hyperlipidemia, unspecified: Secondary | ICD-10-CM | POA: Diagnosis present

## 2021-11-05 DIAGNOSIS — G9389 Other specified disorders of brain: Secondary | ICD-10-CM | POA: Diagnosis not present

## 2021-11-05 DIAGNOSIS — C7931 Secondary malignant neoplasm of brain: Secondary | ICD-10-CM | POA: Diagnosis not present

## 2021-11-05 DIAGNOSIS — M8008XA Age-related osteoporosis with current pathological fracture, vertebra(e), initial encounter for fracture: Secondary | ICD-10-CM

## 2021-11-05 DIAGNOSIS — J9600 Acute respiratory failure, unspecified whether with hypoxia or hypercapnia: Secondary | ICD-10-CM | POA: Diagnosis not present

## 2021-11-05 DIAGNOSIS — N189 Chronic kidney disease, unspecified: Secondary | ICD-10-CM | POA: Diagnosis present

## 2021-11-05 DIAGNOSIS — R7303 Prediabetes: Secondary | ICD-10-CM | POA: Diagnosis present

## 2021-11-05 DIAGNOSIS — C50412 Malignant neoplasm of upper-outer quadrant of left female breast: Secondary | ICD-10-CM | POA: Diagnosis present

## 2021-11-05 DIAGNOSIS — Z881 Allergy status to other antibiotic agents status: Secondary | ICD-10-CM

## 2021-11-05 DIAGNOSIS — N183 Chronic kidney disease, stage 3 unspecified: Secondary | ICD-10-CM | POA: Diagnosis present

## 2021-11-05 DIAGNOSIS — C3490 Malignant neoplasm of unspecified part of unspecified bronchus or lung: Secondary | ICD-10-CM | POA: Diagnosis present

## 2021-11-05 DIAGNOSIS — K5730 Diverticulosis of large intestine without perforation or abscess without bleeding: Secondary | ICD-10-CM | POA: Diagnosis not present

## 2021-11-05 LAB — COMPREHENSIVE METABOLIC PANEL
ALT: 59 U/L — ABNORMAL HIGH (ref 0–44)
AST: 62 U/L — ABNORMAL HIGH (ref 15–41)
Albumin: 3.4 g/dL — ABNORMAL LOW (ref 3.5–5.0)
Alkaline Phosphatase: 70 U/L (ref 38–126)
Anion gap: 12 (ref 5–15)
BUN: 20 mg/dL (ref 8–23)
CO2: 31 mmol/L (ref 22–32)
Calcium: 9.9 mg/dL (ref 8.9–10.3)
Chloride: 96 mmol/L — ABNORMAL LOW (ref 98–111)
Creatinine, Ser: 1.48 mg/dL — ABNORMAL HIGH (ref 0.44–1.00)
GFR, Estimated: 38 mL/min — ABNORMAL LOW (ref >60)
Glucose, Bld: 118 mg/dL — ABNORMAL HIGH (ref 70–99)
Potassium: 4 mmol/L (ref 3.5–5.1)
Sodium: 139 mmol/L (ref 135–145)
Total Bilirubin: 0.8 mg/dL (ref 0.3–1.2)
Total Protein: 6.8 g/dL (ref 6.5–8.1)

## 2021-11-05 LAB — CBC
HCT: 48.9 % — ABNORMAL HIGH (ref 36.0–46.0)
Hemoglobin: 15.1 g/dL — ABNORMAL HIGH (ref 12.0–15.0)
MCH: 30.6 pg (ref 26.0–34.0)
MCHC: 30.9 g/dL (ref 30.0–36.0)
MCV: 99 fL (ref 80.0–100.0)
Platelets: 209 10*3/uL (ref 150–400)
RBC: 4.94 MIL/uL (ref 3.87–5.11)
RDW: 13.2 % (ref 11.5–15.5)
WBC: 13.5 10*3/uL — ABNORMAL HIGH (ref 4.0–10.5)
nRBC: 0 % (ref 0.0–0.2)

## 2021-11-05 LAB — LIPASE, BLOOD: Lipase: 54 U/L — ABNORMAL HIGH (ref 11–51)

## 2021-11-05 MED ORDER — ACETAMINOPHEN 500 MG PO TABS
1000.0000 mg | ORAL_TABLET | Freq: Once | ORAL | Status: AC
  Administered 2021-11-05: 1000 mg via ORAL
  Filled 2021-11-05: qty 2

## 2021-11-05 NOTE — Telephone Encounter (Signed)
[name] (Daughter) called about her mom, she "states her mom has an appointment for tomorrow with Dr. Einar Pheasant @11 :00 am." She also stated that her mom "is very weak, they cannot get her out of the house, and she has had diarrhea ever since she was discharged." She was concerned on what to do, I transferred her to Access Nurse to speak with someone. Please return the call when possible. Thanks  Callback Number: [phone]

## 2021-11-05 NOTE — Telephone Encounter (Signed)
Spoke to pt's dtr, [name], and relayed Dr. Verda Cumins recommendation. Dtr will call EMS. Canceling appt for pt tomorrow.

## 2021-11-05 NOTE — Telephone Encounter (Signed)
She needs to call ems and go to the ER. A virtual visit is unacceptable with her symptoms.   It sounds like she probably needs skilled nursing

## 2021-11-05 NOTE — Telephone Encounter (Signed)
Please see message or phone note from [facility] as she just spoke with pts daughter.

## 2021-11-05 NOTE — ED Triage Notes (Signed)
Pt to ED from home via [facility] c/o diarrhea and lower back pain x2 weeks.  States was at [facility] for acute on chronic respiratory failure and discharged, has had diarrhea since, decreased mobility.  Was on 2L [place] chronic but increased to 3 since recent hospitalization.  Hx of CHF and had 28lbs fluids taken off by diuretics.  Was on IV abx in hospital and continued oral abx.  Pt A&Ox4, chest rise even and unlabored, skin WNL and in NAD at this time.

## 2021-11-05 NOTE — Telephone Encounter (Signed)
[facility] Day - Client TELEPHONE ADVICE RECORD [facility] Patient Name: Daisy [name] [facility] Gender: Female DOB: Nov 22, 1950 Age: 71 Y 1 M 23 D Return Phone Number: [phone] (Primary), [phone] (Secondary) Address[place]  [address] Client [place] [facility] - Client Client Site [place] - Day Provider Waunita Schooner- MD Contact Type Call Who Is Calling Patient / Member / Family / Caregiver Call Type Triage / Clinical Caller Name [name] Relationship To Patient Daughter Return Phone Number Please choose phone number Chief Complaint Weakness, Generalized Reason for Call Symptomatic / Request for Health Information Initial Comment Mother was discharged from hospital and was diagnosed with acute respiratory failure. States she is having a hard time getting her out of the house with weakness and she is having diarrhea. Translation No Nurse Assessment Nurse: Linward Headland, RN, [place] Date/Time (Eastern Time): 11/05/2021 3:22:30 PM Confirm and document reason for call. If symptomatic, describe symptoms. ---Caller states mom discharged from hospital the 16th of may, diagnosed with acute on chronic community acquired pneumonia, chf exacerbation. She is now having progressively worsening weakness and diarrhea. She usually has loose stools and it has been consistent. She has been doing a very bland diet but it is not helping. She was on iv abx in the hospital and dc'ed with [facility] and having a lot of weakness. States she cannot walk across the room with walker and oxygen without getting exhausted. Social worker did come out to the home today and put a request for inpatient rehabilitation. She cannot walk enough to walk to the doctor's visit she has tomorrow. Does the patient have any new or worsening symptoms? ---Yes Will a triage be completed? ---Yes Related visit to physician  within the last 2 weeks? ---Yes Does the PT have any chronic conditions? (i.e. diabetes, asthma, this includes High risk factors for pregnancy, etc.) ---Yes List chronic conditions. ---CHF, community acquired pneumonia, macular degeneration, on oxygen, breast cancer survivor, is on 3 liters o2 24/7. Is this a behavioral health or substance abuse call? ---No PLEASE NOTE: All timestamps contained within this report are represented as [place] Standard Time. CONFIDENTIALTY NOTICE: This fax transmission is intended only for the addressee. It contains information that is legally privileged, confidential or otherwise protected from use or disclosure. If you are not the intended recipient, you are strictly prohibited from reviewing, disclosing, copying using or disseminating any of this information or taking any action in reliance on or regarding this information. If you have received this fax in error, please notify us immediately by telephone so that we can arrange for its return to [place]. Phone: [phone], Toll-Free: [phone], Fax: [phone] Page: 2 of 2 Call Id: [id number] Guidelines Guideline Title Affirmed Question Affirmed Notes Nurse Date/Time Eilene Ghazi Time) Heart Failure PostHospitalization Follow-up Call Oxygen level (e.g., pulse oximetry) 90 percent or lower [place], RN, Wilhemena Durie 11/05/2021 3:26:51 PM Disp. Time Eilene Ghazi Time) Disposition Final User 11/05/2021 3:38:36 PM 911 Outcome Documentation Linward Headland, RN, [place] Reason: caller declines to call 911, states mother is now sitting up and oxygen went up to 93%. Recommended 911 still due to patient's condition, caller is gong to speak to a family member to determine what to do next. 11/05/2021 3:31:03 PM Call EMS 911 Now Yes Linward Headland, RN, Wilhemena Durie Disposition Overriden: Go to ED Now Override Reason: Patient's symptoms need a higher level of care Caller Disagree/Comply Comply Caller Understands Yes PreDisposition  InappropriateToAsk Care Advice Given Per Guideline CARE ADVICE given per  Heart Failure Post-Hospitalization Follow-Up Call (Adult) guideline. GO TO ED NOW: * You need to be seen in the Emergency Department. PULSE OXIMETRY - HOW TO INTERPRET OXYGEN LEVELS? * 86 - 90%: Moderately low oxygen level. Moderate hypoxia. Medical evaluation needed. Oxygen needed. PULSE OXIMETER - IMPORTANT TIPS ON CORRECT USE: * Use the index or middle finger. Try not to use the toes or ear lobes. * Remove nail polish from the finger on which pulse oximetry is being performed. * Warm the hand prior to measurement. CALL EMS 911 NOW: * Immediate medical attention is needed. You need to hang up and call 911 (or an ambulance). CARE ADVICE given per Heart Failure PostHospitalization Follow-Up Call (Adult) guideline. Comments User: Jodelle Green, RN Date/Time Eilene Ghazi Time): 11/05/2021 3:27:08 PM at rest oxygen sits at 91-92%

## 2021-11-05 NOTE — ED Notes (Signed)
Pt also states she's had productive cough, some wheezing at home, and was told she would have repeat chest xray prior to discharge but didn't.

## 2021-11-06 ENCOUNTER — Emergency Department: Payer: Medicare PPO

## 2021-11-06 ENCOUNTER — Inpatient Hospital Stay: Payer: Medicare PPO

## 2021-11-06 ENCOUNTER — Ambulatory Visit: Payer: Medicare PPO | Admitting: Family Medicine

## 2021-11-06 DIAGNOSIS — C3492 Malignant neoplasm of unspecified part of left bronchus or lung: Secondary | ICD-10-CM | POA: Diagnosis present

## 2021-11-06 DIAGNOSIS — R918 Other nonspecific abnormal finding of lung field: Secondary | ICD-10-CM | POA: Diagnosis not present

## 2021-11-06 DIAGNOSIS — Z515 Encounter for palliative care: Secondary | ICD-10-CM | POA: Diagnosis not present

## 2021-11-06 DIAGNOSIS — C3490 Malignant neoplasm of unspecified part of unspecified bronchus or lung: Secondary | ICD-10-CM | POA: Insufficient documentation

## 2021-11-06 DIAGNOSIS — C7971 Secondary malignant neoplasm of right adrenal gland: Secondary | ICD-10-CM | POA: Diagnosis present

## 2021-11-06 DIAGNOSIS — N1831 Chronic kidney disease, stage 3a: Secondary | ICD-10-CM | POA: Diagnosis present

## 2021-11-06 DIAGNOSIS — S22080K Wedge compression fracture of T11-T12 vertebra, subsequent encounter for fracture with nonunion: Secondary | ICD-10-CM | POA: Diagnosis not present

## 2021-11-06 DIAGNOSIS — F411 Generalized anxiety disorder: Secondary | ICD-10-CM | POA: Diagnosis present

## 2021-11-06 DIAGNOSIS — J9622 Acute and chronic respiratory failure with hypercapnia: Secondary | ICD-10-CM | POA: Diagnosis present

## 2021-11-06 DIAGNOSIS — N3000 Acute cystitis without hematuria: Secondary | ICD-10-CM | POA: Diagnosis not present

## 2021-11-06 DIAGNOSIS — I130 Hypertensive heart and chronic kidney disease with heart failure and stage 1 through stage 4 chronic kidney disease, or unspecified chronic kidney disease: Secondary | ICD-10-CM | POA: Diagnosis present

## 2021-11-06 DIAGNOSIS — J189 Pneumonia, unspecified organism: Secondary | ICD-10-CM | POA: Diagnosis present

## 2021-11-06 DIAGNOSIS — E662 Morbid (severe) obesity with alveolar hypoventilation: Secondary | ICD-10-CM | POA: Diagnosis present

## 2021-11-06 DIAGNOSIS — J9621 Acute and chronic respiratory failure with hypoxia: Secondary | ICD-10-CM | POA: Diagnosis present

## 2021-11-06 DIAGNOSIS — F329 Major depressive disorder, single episode, unspecified: Secondary | ICD-10-CM | POA: Diagnosis present

## 2021-11-06 DIAGNOSIS — B965 Pseudomonas (aeruginosa) (mallei) (pseudomallei) as the cause of diseases classified elsewhere: Secondary | ICD-10-CM | POA: Diagnosis present

## 2021-11-06 DIAGNOSIS — C7931 Secondary malignant neoplasm of brain: Secondary | ICD-10-CM | POA: Diagnosis not present

## 2021-11-06 DIAGNOSIS — C50412 Malignant neoplasm of upper-outer quadrant of left female breast: Secondary | ICD-10-CM | POA: Diagnosis present

## 2021-11-06 DIAGNOSIS — I739 Peripheral vascular disease, unspecified: Secondary | ICD-10-CM | POA: Diagnosis present

## 2021-11-06 DIAGNOSIS — K219 Gastro-esophageal reflux disease without esophagitis: Secondary | ICD-10-CM | POA: Diagnosis present

## 2021-11-06 DIAGNOSIS — Z66 Do not resuscitate: Secondary | ICD-10-CM | POA: Diagnosis present

## 2021-11-06 DIAGNOSIS — C782 Secondary malignant neoplasm of pleura: Secondary | ICD-10-CM | POA: Diagnosis present

## 2021-11-06 DIAGNOSIS — Z6841 Body Mass Index (BMI) 40.0 and over, adult: Secondary | ICD-10-CM | POA: Diagnosis not present

## 2021-11-06 DIAGNOSIS — I5032 Chronic diastolic (congestive) heart failure: Secondary | ICD-10-CM | POA: Diagnosis present

## 2021-11-06 DIAGNOSIS — G9341 Metabolic encephalopathy: Secondary | ICD-10-CM | POA: Diagnosis present

## 2021-11-06 DIAGNOSIS — N390 Urinary tract infection, site not specified: Secondary | ICD-10-CM | POA: Diagnosis present

## 2021-11-06 DIAGNOSIS — Z7189 Other specified counseling: Secondary | ICD-10-CM | POA: Diagnosis not present

## 2021-11-06 DIAGNOSIS — M8458XA Pathological fracture in neoplastic disease, other specified site, initial encounter for fracture: Secondary | ICD-10-CM | POA: Diagnosis present

## 2021-11-06 DIAGNOSIS — N179 Acute kidney failure, unspecified: Secondary | ICD-10-CM | POA: Diagnosis present

## 2021-11-06 DIAGNOSIS — C7951 Secondary malignant neoplasm of bone: Secondary | ICD-10-CM | POA: Diagnosis present

## 2021-11-06 DIAGNOSIS — J439 Emphysema, unspecified: Secondary | ICD-10-CM | POA: Diagnosis present

## 2021-11-06 LAB — URINALYSIS, ROUTINE W REFLEX MICROSCOPIC
Bacteria, UA: NONE SEEN
Bilirubin Urine: NEGATIVE
Glucose, UA: NEGATIVE mg/dL
Hgb urine dipstick: NEGATIVE
Ketones, ur: NEGATIVE mg/dL
Nitrite: NEGATIVE
Protein, ur: 30 mg/dL — AB
Specific Gravity, Urine: >1.046 — ABNORMAL HIGH (ref 1.005–1.030)
pH: 6 (ref 5.0–8.0)

## 2021-11-06 MED ORDER — ACETAMINOPHEN 325 MG PO TABS
650.0000 mg | ORAL_TABLET | ORAL | Status: DC | PRN
  Administered 2021-11-13 – 2021-11-19 (×3): 650 mg via ORAL
  Filled 2021-11-06 (×3): qty 2

## 2021-11-06 MED ORDER — BEMPEDOIC ACID-EZETIMIBE 180-10 MG PO TABS: 1.0000 | ORAL_TABLET | Freq: Every day | ORAL | Status: DC

## 2021-11-06 MED ORDER — MORPHINE SULFATE (PF) 4 MG/ML IV SOLN
4.0000 mg | Freq: Once | INTRAVENOUS | Status: AC
  Administered 2021-11-06: 4 mg via INTRAVENOUS
  Filled 2021-11-06: qty 1

## 2021-11-06 MED ORDER — SODIUM CHLORIDE 0.9% FLUSH: 3.0000 mL | INTRAVENOUS | Status: DC | PRN

## 2021-11-06 MED ORDER — ENOXAPARIN SODIUM 80 MG/0.8ML IJ SOSY
70.0000 mg | PREFILLED_SYRINGE | INTRAMUSCULAR | Status: DC
  Administered 2021-11-06: 70 mg via SUBCUTANEOUS
  Filled 2021-11-06 (×2): qty 0.7

## 2021-11-06 MED ORDER — ASPIRIN 81 MG PO CHEW
81.0000 mg | CHEWABLE_TABLET | Freq: Every day | ORAL | Status: DC
  Administered 2021-11-06 – 2021-11-07 (×2): 81 mg via ORAL
  Filled 2021-11-06 (×2): qty 1

## 2021-11-06 MED ORDER — ONDANSETRON HCL 4 MG PO TABS: 4.0000 mg | ORAL_TABLET | Freq: Four times a day (QID) | ORAL | Status: DC | PRN

## 2021-11-06 MED ORDER — HYDROCODONE-ACETAMINOPHEN 5-325 MG PO TABS
1.0000 | ORAL_TABLET | ORAL | Status: DC | PRN
  Administered 2021-11-07 – 2021-11-19 (×26): 1 via ORAL
  Filled 2021-11-06 (×26): qty 1

## 2021-11-06 MED ORDER — FLUOXETINE HCL 20 MG PO CAPS
20.0000 mg | ORAL_CAPSULE | Freq: Every day | ORAL | Status: DC
  Administered 2021-11-06 – 2021-11-19 (×14): 20 mg via ORAL
  Filled 2021-11-06 (×14): qty 1

## 2021-11-06 MED ORDER — POTASSIUM CHLORIDE CRYS ER 20 MEQ PO TBCR
20.0000 meq | EXTENDED_RELEASE_TABLET | Freq: Every day | ORAL | Status: DC
  Administered 2021-11-06 – 2021-11-07 (×2): 20 meq via ORAL
  Filled 2021-11-06 (×2): qty 1

## 2021-11-06 MED ORDER — SODIUM CHLORIDE 0.9 % IV SOLN: 250.0000 mL | INTRAVENOUS | Status: DC | PRN

## 2021-11-06 MED ORDER — METOPROLOL SUCCINATE ER 25 MG PO TB24
12.5000 mg | ORAL_TABLET | Freq: Every day | ORAL | Status: DC
  Administered 2021-11-06 – 2021-11-19 (×14): 12.5 mg via ORAL
  Filled 2021-11-06 (×3): qty 1
  Filled 2021-11-06: qty 0.5
  Filled 2021-11-06 (×10): qty 1

## 2021-11-06 MED ORDER — IOHEXOL 300 MG/ML  SOLN
100.0000 mL | Freq: Once | INTRAMUSCULAR | Status: AC | PRN
  Administered 2021-11-06: 100 mL via INTRAVENOUS

## 2021-11-06 MED ORDER — TORSEMIDE 20 MG PO TABS
20.0000 mg | ORAL_TABLET | Freq: Every day | ORAL | Status: DC
  Administered 2021-11-06 – 2021-11-07 (×2): 20 mg via ORAL
  Filled 2021-11-06 (×2): qty 1

## 2021-11-06 MED ORDER — SODIUM CHLORIDE 0.9 % IV SOLN: INTRAVENOUS | Status: DC

## 2021-11-06 MED ORDER — PANTOPRAZOLE SODIUM 20 MG PO TBEC
20.0000 mg | DELAYED_RELEASE_TABLET | Freq: Every day | ORAL | Status: DC
  Administered 2021-11-06 – 2021-11-19 (×13): 20 mg via ORAL
  Filled 2021-11-06 (×14): qty 1

## 2021-11-06 MED ORDER — GADOBUTROL 1 MMOL/ML IV SOLN
10.0000 mL | Freq: Once | INTRAVENOUS | Status: AC | PRN
  Administered 2021-11-06: 10 mL via INTRAVENOUS

## 2021-11-06 MED ORDER — ONDANSETRON HCL 4 MG/2ML IJ SOLN: 4.0000 mg | Freq: Four times a day (QID) | INTRAMUSCULAR | Status: DC | PRN

## 2021-11-06 MED ORDER — LORAZEPAM 0.5 MG PO TABS
0.5000 mg | ORAL_TABLET | ORAL | Status: DC | PRN
  Administered 2021-11-06 – 2021-11-17 (×2): 0.5 mg via ORAL
  Filled 2021-11-06 (×2): qty 1

## 2021-11-06 MED ORDER — VITAMIN B-12 1000 MCG PO TABS
1000.0000 ug | ORAL_TABLET | Freq: Every day | ORAL | Status: DC
  Administered 2021-11-06 – 2021-11-19 (×13): 1000 ug via ORAL
  Filled 2021-11-06 (×13): qty 1

## 2021-11-06 MED ORDER — CILOSTAZOL 100 MG PO TABS
50.0000 mg | ORAL_TABLET | Freq: Two times a day (BID) | ORAL | Status: DC
  Administered 2021-11-06 – 2021-11-07 (×3): 50 mg via ORAL
  Filled 2021-11-06 (×3): qty 0.5

## 2021-11-06 MED ORDER — IPRATROPIUM-ALBUTEROL 0.5-2.5 (3) MG/3ML IN SOLN: 3.0000 mL | Freq: Four times a day (QID) | RESPIRATORY_TRACT | Status: DC | PRN

## 2021-11-06 MED ORDER — SODIUM CHLORIDE 0.9% FLUSH
3.0000 mL | Freq: Two times a day (BID) | INTRAVENOUS | Status: DC
  Administered 2021-11-07 – 2021-11-19 (×22): 3 mL via INTRAVENOUS

## 2021-11-06 MED ORDER — CYCLOBENZAPRINE HCL 10 MG PO TABS
5.0000 mg | ORAL_TABLET | Freq: Three times a day (TID) | ORAL | Status: DC | PRN
  Administered 2021-11-07 – 2021-11-14 (×5): 5 mg via ORAL
  Filled 2021-11-06 (×5): qty 1

## 2021-11-06 MED ORDER — VITAMIN D 25 MCG (1000 UNIT) PO TABS
1000.0000 [IU] | ORAL_TABLET | Freq: Every day | ORAL | Status: DC
  Administered 2021-11-06 – 2021-11-19 (×13): 1000 [IU] via ORAL
  Filled 2021-11-06 (×14): qty 1

## 2021-11-06 MED ORDER — ONDANSETRON HCL 4 MG/2ML IJ SOLN
4.0000 mg | Freq: Once | INTRAMUSCULAR | Status: AC
Start: 2021-11-06 — End: 2021-11-06
  Administered 2021-11-06: 4 mg via INTRAVENOUS
  Filled 2021-11-06: qty 2

## 2021-11-06 NOTE — ED Notes (Signed)
Called [facility] ortho Constance Holster)  and ordered TLSO brace for pt.

## 2021-11-06 NOTE — Assessment & Plan Note (Signed)
Stable and not acutely exacerbated Continue as needed bronchodilator therapy

## 2021-11-06 NOTE — Assessment & Plan Note (Addendum)
Stable and not acutely exacerbated.  --Stopped metoprolol per discussion w family --Torsemide daily PRN at d/c

## 2021-11-06 NOTE — Assessment & Plan Note (Deleted)
CKD stage IIIa

## 2021-11-06 NOTE — Progress Notes (Signed)
Orthopedic Tech Progress Note Patient Details:  [name] 12/11/1950 [id number]  Patient ID: Daisy [name], female   DOB: [birth year], 71 y.o.   MRN: [id number] Brace ordered. Edwina Barth 11/06/2021, 6:53 AM

## 2021-11-06 NOTE — ED Provider Notes (Signed)
[facility] Provider Note    Event Date/Time   First MD Initiated Contact with Patient 11/05/21 2356     (approximate)   History   Diarrhea and Back Pain   HPI  Daisy [name] is a 71 y.o. female with history of CHF, CKD, hyperlipidemia, morbid obesity, peripheral vascular disease who presents to the emergency department her son for complaints of generalized abdominal pain and diarrhea.  States symptoms started while she was recently in the hospital for community-acquired pneumonia and was receiving IV antibiotics.  [place].  Denies any nausea or vomiting.  Denies any chest pain or shortness of breath.  No fever.  No urinary symptoms.  Has had previous hysterectomy, C-section x2, cholecystectomy, ovarian cyst removal.  He is also complaining of back pain for the past several weeks.  No new injury.  No numbness, tingling, weakness, bowel or bladder incontinence, urinary retention.  States it hurts to move and walk.  Family reports they have a hard time caring for her at home.   History provided by patient and family.    Past Medical History:  Diagnosis Date   Acid reflux    Acute heart failure ([place]) 10/17/2019   AKI (acute kidney injury) ([place]) 10/17/2019   Arthritis    Breast cancer of upper-outer quadrant of right female breast ([place]) 11/19/2016   Chicken pox    Cholecystitis    Chronic diastolic heart failure ([place]) 01/04/2020   CKD (chronic kidney disease), stage III ([place]) 01/04/2020   Depression    Dyspnea    History of radiation therapy 01/20/17-03/02/17   left breast was treated to 50.4 Gy in 18 fractions   HLD (hyperlipidemia) 01/04/2020   Hyperlipemia    Irregular heart rate    Malignant neoplasm of upper-outer quadrant of left breast in female, estrogen receptor positive ([place]) 09/30/2016   Morbid obesity with BMI of 50.0-59.9, adult ([place]) 01/04/2020   Obesity    Peripheral vascular disease ([place])    Pneumonia    Prediabetes 01/04/2020    Urinary tract infection    Urinary tract infection with hematuria     Past Surgical History:  Procedure Laterality Date   ABDOMINAL HYSTERECTOMY     BREAST LUMPECTOMY Left 11/19/2016   BREAST LUMPECTOMY WITH RADIOACTIVE SEED AND SENTINEL LYMPH NODE BIOPSY (Left)   BREAST LUMPECTOMY WITH RADIOACTIVE SEED AND SENTINEL LYMPH NODE BIOPSY Left 11/19/2016   Procedure: BREAST LUMPECTOMY WITH RADIOACTIVE SEED AND SENTINEL LYMPH NODE BIOPSY;  Surgeon: Rolm Bookbinder, MD;  Location: [place];  Service: General;  Laterality: Left;   CESAREAN SECTION     CESAREAN SECTION     x 2   CHOLECYSTECTOMY  2007   KNEE ARTHROSCOPY     bilateral   KNEE SURGERY Bilateral    LOWER EXTREMITY ANGIOGRAM N/A 04/04/2014   Procedure: LOWER EXTREMITY ANGIOGRAM;  Surgeon: Laverda Page, MD;  Location: [facility] C[facility] LAB;  Service: Cardiovascular;  Laterality: N/A;   OVARIAN CYST REMOVAL     RE-EXCISION OF BREAST LUMPECTOMY Left 12/12/2016   Procedure: RE-EXCISION OF LEFT BREAST LUMPECTOMY;  Surgeon: Rolm Bookbinder, MD;  Location: [place];  Service: General;  Laterality: Left;   ROTATOR CUFF REPAIR Right    TONSILLECTOMY     WRIST SURGERY Bilateral     MEDICATIONS:  Prior to Admission medications   Medication Sig Start Date End Date Taking? Authorizing Provider  acetaminophen (TYLENOL) 325 MG tablet Take 2 tablets (650 mg total) by mouth every  4 (four) hours as needed for headache or mild pain. 11/02/19   [place] Shi, MD  albuterol (VENTOLIN HFA) 108 (90 Base) MCG/ACT inhaler TAKE 2 PUFFS BY MOUTH EVERY 6 HOURS AS NEEDED FOR WHEEZE OR SHORTNESS OF BREATH Patient taking differently: Inhale 2 puffs into the lungs every 6 (six) hours as needed for wheezing or shortness of breath. 08/06/21   Lesleigh Noe, MD  aspirin 81 MG chewable tablet Chew 81 mg by mouth daily.    [provider]  cholecalciferol (VITAMIN D3) 25 MCG (1000 UNIT) tablet Take 1,000 Units by mouth daily.    [provider]   cilostazol (PLETAL) 50 MG tablet Take 1 tablet (50 mg total) by mouth 2 (two) times daily. 11/01/21   Lesleigh Noe, MD  cyanocobalamin 1000 MCG tablet Take 1,000 mcg by mouth daily.    [provider]  dapagliflozin propanediol (FARXIGA) 5 MG TABS tablet Take 5 mg by mouth daily.    [provider]  FLUoxetine (PROZAC) 20 MG capsule TAKE 1 CAPSULE BY MOUTH EVERY DAY Patient taking differently: Take 20 mg by mouth daily. 08/06/21   Lesleigh Noe, MD  KLOR-CON M20 20 MEQ tablet TAKE 1 TABLET BY MOUTH EVERY DAY Patient taking differently: Take 20 mEq by mouth daily. 05/23/21   Lesleigh Noe, MD  metoprolol succinate (TOPROL-XL) 25 MG 24 hr tablet Take 0.5 tablets (12.5 mg total) by mouth daily. 03/20/21   Lesleigh Noe, MD  NEXLIZET 180-10 MG TABS TAKE 1 TABLET BY MOUTH EVERY DAY Patient taking differently: Take 1 tablet by mouth at bedtime. 04/08/21   Adrian Prows, MD  pantoprazole (PROTONIX) 20 MG tablet Take 1 tablet (20 mg total) by mouth daily. 03/20/21   Lesleigh Noe, MD  predniSONE (DELTASONE) 20 MG tablet Take 2 tablets (40mg ) daily for 2 days then 1 tablet (20mg ) daily for 2 days then STOP 10/29/21   Domenic Polite, MD  torsemide (DEMADEX) 10 MG tablet Take 2 tablets (20 mg total) by mouth daily. 10/29/21   Domenic Polite, MD    Physical Exam   Triage Vital Signs: ED Triage Vitals  Enc Vitals Group     BP 11/05/21 1911 120/79     Pulse Rate 11/05/21 1911 84     Resp 11/05/21 1911 16     Temp 11/05/21 1911 98.2 F (36.8 C)     Temp Source 11/05/21 1911 Oral     SpO2 11/05/21 1911 93 %     Weight 11/05/21 1912 (!) 319 lb (144.7 kg)     Height 11/05/21 1912 5\' 7"  (1.702 m)     Head Circumference --      Peak Flow --      Pain Score 11/05/21 1912 9     Pain Loc --      Pain Edu? --      Excl. in [place]? --     Most recent vital signs: Vitals:   11/06/21 0200 11/06/21 0300  BP: 138/69 125/68  Pulse: 94 91  Resp: 19 17  Temp:    SpO2: 93% 93%     CONSTITUTIONAL: Alert and oriented and responds appropriately to questions. Well-appearing; well-nourished HEAD: Normocephalic, atraumatic EYES: Conjunctivae clear, pupils appear equal, sclera nonicteric ENT: normal nose; moist mucous membranes NECK: Supple, normal ROM CARD: RRR; S1 and S2 appreciated; no murmurs, no clicks, no rubs, no gallops RESP: Normal chest excursion without splinting or tachypnea; breath sounds clear and equal bilaterally; no wheezes, no  rhonchi, no rales, no hypoxia or respiratory distress, speaking full sentences ABD/GI: Normal bowel sounds; non-distended; soft, mild generalized tenderness without guarding or rebound, exam limited due to body habitus BACK: The back appears normal EXT: Normal ROM in all joints; no deformity noted, no edema; no cyanosis SKIN: Normal color for age and race; warm; no rash on exposed skin NEURO: Moves all extremities equally, normal speech PSYCH: The patient's mood and manner are appropriate.   ED Results / Procedures / Treatments   LABS: (all labs ordered are listed, but only abnormal results are displayed) Labs Reviewed  LIPASE, BLOOD - Abnormal; Notable for the following components:      Result Value   Lipase 54 (*)    All other components within normal limits  COMPREHENSIVE METABOLIC PANEL - Abnormal; Notable for the following components:   Chloride 96 (*)    Glucose, Bld 118 (*)    Creatinine, Ser 1.48 (*)    Albumin 3.4 (*)    AST 62 (*)    ALT 59 (*)    GFR, Estimated 38 (*)    All other components within normal limits  CBC - Abnormal; Notable for the following components:   WBC 13.5 (*)    Hemoglobin 15.1 (*)    HCT 48.9 (*)    All other components within normal limits  URINALYSIS, ROUTINE W REFLEX MICROSCOPIC - Abnormal; Notable for the following components:   Color, Urine YELLOW (*)    APPearance CLEAR (*)    Specific Gravity, Urine >1.046 (*)    Protein, ur 30 (*)    Leukocytes,Ua TRACE (*)    All  other components within normal limits  GASTROINTESTINAL PANEL BY PCR, STOOL (REPLACES STOOL CULTURE)  C DIFFICILE QUICK SCREEN W PCR REFLEX       EKG:  RADIOLOGY: My personal review and interpretation of imaging: Chest x-ray shows persistent left pleural effusion and left lower lobe consolidation.  CT scans shows likely primary pulmonary malignancy with metastasis to adrenal glands and liver.  I have personally reviewed all radiology reports.   DG Chest 2 View  Result Date: 11/05/2021 CLINICAL DATA:  Productive cough, wheezing EXAM: CHEST - 2 VIEW COMPARISON:  10/25/2021 FINDINGS: Left basilar opacity persists, likely representing a lungs are otherwise clear. No pneumothorax. Cardiac size is mildly enlarged. Pulmonary vascularity is normal. No acute bone abnormality. Moderate left pleural effusion with associated left basilar atelectasis or infiltrate. IMPRESSION: Stable moderate left pleural effusion with associated left basilar atelectasis or infiltrate. Given its persistence, CT imaging with contrast would be helpful to exclude the presence of a chronic infiltrate within the left lung base. Electronically Signed   By: Fidela Salisbury M.D.   On: 11/05/2021 19:51    CT IMPRESSION: Central obstructing mass within the left lower lobe, poorly delineated, most in keeping with a primary pulmonary malignancy with resultant obliteration of the left lower lobar pulmonary bronchus and narrowing of the left inferior pulmonary vein. Resultant collapse of the left lower lobe. 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 PET CT examination would be helpful to better delineate the primary mass and confirm the presence of pleural metastatic disease. Hepatic metastases should be easily amenable to ultrasound-guided biopsy for further evaluation.  Moderate  coronary artery calcification.  Morphologic changes in keeping with pulmonary arterial hypertension.  Moderate sigmoid diverticulosis.  Acute to subacute superior endplate fracture of B51 with 10-20% loss of height.  No retropulsion. This could be confirmed with MRI examination.  Aortic Atherosclerosis (ICD10-I70.0).   Electronically Signed By: Fidela Salisbury M.D. On: 11/06/2021 01:08    PROCEDURES:  Critical Care performed: No   .1-3 Lead EKG Interpretation Performed by: Laresha Bacorn, Delice Bison, DO Authorized by: Cung Masterson, Delice Bison, DO     Interpretation: normal     ECG rate:  87   ECG rate assessment: normal     Rhythm: sinus rhythm     Ectopy: none     Conduction: normal      IMPRESSION / MDM / ASSESSMENT AND PLAN / ED COURSE  I reviewed the triage vital signs and the nursing notes.    Patient here with complaints of abdominal pain, diarrhea.  Symptoms started while getting antibiotics for community-acquired pneumonia.  No vomiting.  The patient is on the cardiac monitor to evaluate for evidence of arrhythmia and/or significant heart rate changes.   DIFFERENTIAL DIAGNOSIS (includes but not limited to):   C. difficile, other infectious diarrhea, viral gastroenteritis, colitis, diverticulitis, appendicitis, bowel obstruction, UTI   PLAN: We will obtain CBC, CMP, lipase, urinalysis, CT of the abdomen pelvis.  Will give IV fluids, pain and nausea medicine.  Patient had a chest x-ray obtained from triage.  Chest x-ray reviewed and interpreted by myself and radiologist and shows stable left pleural effusion with left basilar atelectasis versus infiltrate.  She was just here for community-acquired pneumonia.  Radiologist recommends obtaining CT chest with IV contrast.  She denies any chest pain, shortness of breath, cough.   MEDICATIONS GIVEN IN ED: Medications  0.9 %  sodium chloride infusion ( Intravenous New Bag/Given 11/06/21 0019)  acetaminophen (TYLENOL) tablet 1,000 mg  (1,000 mg Oral Given 11/05/21 1922)  ondansetron (ZOFRAN) injection 4 mg (4 mg Intravenous Given 11/06/21 0019)  morphine (PF) 4 MG/ML injection 4 mg (4 mg Intravenous Given 11/06/21 0019)  iohexol (OMNIPAQUE) 300 MG/ML solution 100 mL (100 mLs Intravenous Contrast Given 11/06/21 0028)  morphine (PF) 4 MG/ML injection 4 mg (4 mg Intravenous Given 11/06/21 0359)     ED COURSE: Patient's labs show leukocytosis of 13,000.  Chronic kidney disease is stable.  Slightly elevated AST and ALT and lipase.  Urine shows no sign of infection.  CT of the chest abdomen pelvis reviewed and interpreted by myself and radiology and is concerning for pulmonary malignancy with metastatic spread to the adrenal glands and liver.  She also has a T12 compression fracture that appears acute.  We will place her in a TLSO brace.  Family is very concerned about the level of pain she has whenever she is trying to get up and move around.  We will give second dose of pain medication here and discussed with her that we can admit for pain control, PT, neurosurgery consultation, oncology consultation.  She has known history of breast cancer, in remission for 5 years.  Does have a history of tobacco use.    CONSULTS:  Consulted and discussed patient's case with hospitalist, Dr. Sidney Ace.  I have recommended admission and consulting physician agrees and will place admission orders.  Patient (and family if present) agree with this plan.   I reviewed all nursing notes, vitals, pertinent previous records.  All labs, EKGs, imaging ordered have been independently reviewed and interpreted by myself.    OUTSIDE RECORDS REVIEWED: Reviewed patient's recent admission from 10/25/2021 to 10/29/2021.         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   Rube Sanchez, Delice Bison, DO 11/06/21 [phone]

## 2021-11-06 NOTE — Assessment & Plan Note (Addendum)
Patient with a known history of left breast cancer status post lumpectomy and radiation treatment. Completed a 5 year course of Anastrazole

## 2021-11-06 NOTE — Assessment & Plan Note (Addendum)
Patient has a mass of the lung, liver, adrenals and osseous metastases.  Patient does have a history of breast cancer. Follows with Dr. Grayland Ormond.   5/30 Liver and T12 bone biopsies obtained.  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 6/5 MRI brain with mets to brain and calvarium -- Oncology consulted -- Palliative care consulted -- Pt discharging home with hospice

## 2021-11-06 NOTE — Assessment & Plan Note (Signed)
Treatment as outlined in 4

## 2021-11-06 NOTE — Assessment & Plan Note (Addendum)
Stop metoprolol

## 2021-11-06 NOTE — ED Notes (Signed)
Breakfast tray delivered

## 2021-11-06 NOTE — Telephone Encounter (Signed)
Appreciate nursing support.  Patient is being admitted to the hospital.

## 2021-11-06 NOTE — Assessment & Plan Note (Addendum)
Continue fluoxetine at this time per pt and family

## 2021-11-06 NOTE — H&P (Signed)
History and Physical    Patient: Daisy [name] XBJ:[id number] DOB: January 05, 1951 DOA: 11/05/2021 DOS: the patient was seen and examined on 11/06/2021 PCP: Lesleigh Noe, MD  Patient coming from: Home  Chief Complaint:  Chief Complaint  Patient presents with   Diarrhea   Back Pain   HPI: Daisy [name] is a 71 y.o. female with medical history significant for morbid obesity (BMI 49.6 kg/m2) with complications of obstructive sleep apnea on BiPAP at night, chronic respiratory failure on as needed oxygen peripheral vascular disease, dyslipidemia, stage III chronic kidney disease, history of breast cancer status post lumpectomy and radiation therapy, chronic diastolic dysfunction CHF, COPD who was recently discharged from the hospital after treatment for acute on chronic hypoxic and hypercapnic respiratory failure secondary to CHF who presents to the emergency room for evaluation of abdominal pain mostly in the left lower quadrant associated with diarrhea. Patient recently completed a course of antibiotic therapy for community-acquired pneumonia.  She rated her abdominal pain a 7 x 10 in intensity at its worst and it was nonradiating.  Pain was aggravated by any form of movement.  It was associated with multiple episodes of loose watery stools.  She denied having any nausea, no vomiting, no fever, no chills, no dizziness, no lightheadedness, no headache, no leg swelling, no blurred vision no focal deficit. She also complains of low back pain which she has had for several weeks but denies having any urinary or fecal incontinence, no numbness or lower extremity weakness. Imaging done in the ER showed a T12 compression fracture that appears acute as well as a left lung mass.  She will be admitted to the hospital for further evaluation. Review of Systems: As mentioned in the history of present illness. All other systems reviewed and are negative. Past Medical History:  Diagnosis Date   Acid  reflux    Acute heart failure ([place]) 10/17/2019   AKI (acute kidney injury) ([place]) 10/17/2019   Arthritis    Breast cancer of upper-outer quadrant of right female breast ([place]) 11/19/2016   Chicken pox    Cholecystitis    Chronic diastolic heart failure ([place]) 01/04/2020   CKD (chronic kidney disease), stage III ([place]) 01/04/2020   Depression    Dyspnea    History of radiation therapy 01/20/17-03/02/17   left breast was treated to 50.4 Gy in 18 fractions   HLD (hyperlipidemia) 01/04/2020   Hyperlipemia    Irregular heart rate    Malignant neoplasm of upper-outer quadrant of left breast in female, estrogen receptor positive ([place]) 09/30/2016   Morbid obesity with BMI of 50.0-59.9, adult ([place]) 01/04/2020   Obesity    Peripheral vascular disease ([place])    Pneumonia    Prediabetes 01/04/2020   Urinary tract infection    Urinary tract infection with hematuria    Past Surgical History:  Procedure Laterality Date   ABDOMINAL HYSTERECTOMY     BREAST LUMPECTOMY Left 11/19/2016   BREAST LUMPECTOMY WITH RADIOACTIVE SEED AND SENTINEL LYMPH NODE BIOPSY (Left)   BREAST LUMPECTOMY WITH RADIOACTIVE SEED AND SENTINEL LYMPH NODE BIOPSY Left 11/19/2016   Procedure: BREAST LUMPECTOMY WITH RADIOACTIVE SEED AND SENTINEL LYMPH NODE BIOPSY;  Surgeon: Rolm Bookbinder, MD;  Location: [facility];  Service: General;  Laterality: Left;   CESAREAN SECTION     CESAREAN SECTION     x 2   CHOLECYSTECTOMY  2007   KNEE ARTHROSCOPY     bilateral   KNEE SURGERY Bilateral    LOWER EXTREMITY ANGIOGRAM N/A  04/04/2014   Procedure: LOWER EXTREMITY ANGIOGRAM;  Surgeon: Laverda Page, MD;  Location[facility];  Service: Cardiovascular;  Laterality: N/A;   OVARIAN CYST REMOVAL     RE-EXCISION OF BREAST LUMPECTOMY Left 12/12/2016   Procedure: RE-EXCISION OF LEFT BREAST LUMPECTOMY;  Surgeon: Rolm Bookbinder, MD;  Location: [facility];  Service: General;  Laterality: Left;   ROTATOR CUFF REPAIR Right    TONSILLECTOMY     WRIST SURGERY  Bilateral    Social History:  reports that she has been smoking e-cigarettes and cigarettes. She has a 30.00 pack-year smoking history. She has never used smokeless tobacco. She reports that she does not drink alcohol and does not use drugs.  Allergies  Allergen Reactions   Elemental Sulfur Hives   Levaquin [Levofloxacin] Other (See Comments)    Body aches   Statins     Family History  Problem Relation Age of Onset   Heart disease Mother    Lung cancer Father    Heart disease Father    Heart disease Brother     Prior to Admission medications   Medication Sig Start Date End Date Taking? Authorizing Provider  acetaminophen (TYLENOL) 325 MG tablet Take 2 tablets (650 mg total) by mouth every 4 (four) hours as needed for headache or mild pain. 11/02/19  Yes Guilford Shi, MD  albuterol (VENTOLIN HFA) 108 (90 Base) MCG/ACT inhaler TAKE 2 PUFFS BY MOUTH EVERY 6 HOURS AS NEEDED FOR WHEEZE OR SHORTNESS OF BREATH Patient taking differently: Inhale 2 puffs into the lungs every 6 (six) hours as needed for wheezing or shortness of breath. 08/06/21  Yes Lesleigh Noe, MD  aspirin 81 MG chewable tablet Chew 81 mg by mouth daily.   Yes [provider]  cholecalciferol (VITAMIN D3) 25 MCG (1000 UNIT) tablet Take 1,000 Units by mouth daily.   Yes [provider]  cilostazol (PLETAL) 50 MG tablet Take 1 tablet (50 mg total) by mouth 2 (two) times daily. 11/01/21  Yes Lesleigh Noe, MD  cyanocobalamin 1000 MCG tablet Take 1,000 mcg by mouth daily.   Yes [provider]  dapagliflozin propanediol (FARXIGA) 5 MG TABS tablet Take 5 mg by mouth daily.   Yes [provider]  FLUoxetine (PROZAC) 20 MG capsule TAKE 1 CAPSULE BY MOUTH EVERY DAY Patient taking differently: Take 20 mg by mouth daily. 08/06/21  Yes Lesleigh Noe, MD  KLOR-CON M20 20 MEQ tablet TAKE 1 TABLET BY MOUTH EVERY DAY Patient taking differently: Take 20 mEq by mouth daily. 05/23/21  Yes Lesleigh Noe, MD  metoprolol succinate (TOPROL-XL) 25 MG 24 hr tablet Take 0.5 tablets (12.5 mg total) by mouth daily. 03/20/21  Yes Lesleigh Noe, MD  NEXLIZET 180-10 MG TABS TAKE 1 TABLET BY MOUTH EVERY DAY Patient taking differently: Take 1 tablet by mouth at bedtime. 04/08/21  Yes Adrian Prows, MD  pantoprazole (PROTONIX) 20 MG tablet Take 1 tablet (20 mg total) by mouth daily. 03/20/21  Yes Lesleigh Noe, MD  torsemide (DEMADEX) 10 MG tablet Take 2 tablets (20 mg total) by mouth daily. 10/29/21  Yes Domenic Polite, MD  predniSONE (DELTASONE) 20 MG tablet Take 2 tablets (40mg ) daily for 2 days then 1 tablet (20mg ) daily for 2 days then STOP Patient not taking: Reported on 11/06/2021 10/29/21   Domenic Polite, MD    Physical Exam: Vitals:   11/06/21 0530 11/06/21 0600 11/06/21 0630 11/06/21 0900  BP: 132/83 117/62 120/63 131/71  Pulse:  88 92 86 96  Resp: 16  17 18   Temp:      TempSrc:      SpO2: 92% 91% 94% 92%  Weight:      Height:       Physical Exam Vitals and nursing note reviewed.  Constitutional:      Appearance: She is obese.  HENT:     Head: Normocephalic and atraumatic.     Nose: Nose normal.     Mouth/Throat:     Mouth: Mucous membranes are moist.  Eyes:     Pupils: Pupils are equal, round, and reactive to light.  Cardiovascular:     Rate and Rhythm: Normal rate and regular rhythm.  Pulmonary:     Effort: Pulmonary effort is normal.     Breath sounds: Normal breath sounds.  Abdominal:     General: Bowel sounds are normal.     Palpations: Abdomen is soft.     Comments: Central adiposity  Musculoskeletal:        General: Normal range of motion.     Cervical back: Normal range of motion and neck supple.  Skin:    General: Skin is warm and dry.  Neurological:     General: No focal deficit present.     Mental Status: She is alert and oriented to person, place, and time.  Psychiatric:        Mood and Affect: Mood normal.        Behavior: Behavior normal.     Data Reviewed: Relevant notes from primary care and specialist visits, past discharge summaries as available in EHR, including Care Everywhere. Prior diagnostic testing as pertinent to current admission diagnoses Updated medications and problem lists for reconciliation ED course, including vitals, labs, imaging, treatment and response to treatment Triage notes, nursing and pharmacy notes and ED provider's notes Notable results as noted in HPI Labs reviewed.  Lipase 54, sodium 139, potassium 4.0, chloride 96, bicarb 31, glucose 118, BUN 20, creatinine 1.48, calcium 9.9, AST 62, ALT 59, total protein 6.8, white count 13.5, hemoglobin 15.1, hematocrit 48.9, MCV 99, RDW 13.2, platelets 209 Chest x-ray reviewed by me shows Stable moderate left pleural effusion with associated left basilar atelectasis or infiltrate. Given its persistence, CT imaging with contrast would be helpful to exclude the presence of a chronic infiltrate within the left lung base. CT scan of the chest/abdomen/pelvis shows Central obstructing mass within the left lower lobe, poorly delineated, most in keeping with a primary pulmonary malignancy with resultant obliteration of the left lower lobar pulmonary bronchus and narrowing of the left inferior pulmonary vein. Resultant collapse of the left lower lobe. 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  PET CT examination would be helpful to better delineate the primary mass and confirm the presence of pleural metastatic disease. Hepatic metastases should be easily amenable to ultrasound-guided biopsy for further evaluation. Moderate coronary artery calcification. Morphologic changes in keeping with pulmonary arterial hypertension. Moderate sigmoid diverticulosis. Acute to subacute superior endplate fracture of Y30 with 10-20% loss of  height. No retropulsion.   Aortic Atherosclerosis. Twelve-lead EKG reviewed by me shows normal sinus rhythm There are no new results to review at this time.  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 We will consult neurosurgery Pain control As needed muscle relaxants  Mass of lower lobe of left lung Patient noted to have a central obstructing mass within the left lower lobe, poorly delineated, most in keeping with a primary pulmonary malignancy with resultant obliteration of the left lower lobar pulmonary bronchus and narrowing of the left inferior pulmonary vein. Resultant collapse of the left lower lobe. 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 We will request pulmonary consult as well as oncology consult Per radiologist patient may be able to have a biopsy of the liver mass for definitive diagnosis  Malignant neoplasm of upper-outer quadrant of left breast in female, estrogen receptor positive ([place]) Patient with a known history of left breast cancer status post lumpectomy and radiation treatment. Patient completed a 5 year course of Anastrazole  Morbid obesity with BMI of 50.0-59.9, adult ([place]) Patient is morbidly obese with a BMI of 49.96 kg/m2 She has complications of obstructive sleep apnea as well as obesity hypoventilation syndrome and requires as needed oxygen as well as BiPAP at bedtime. Continue BiPAP at bedtime Complicates overall prognosis and care  Obesity hypoventilation syndrome (HCC) Treatment as  outlined in 4  Generalized anxiety disorder Stable Continue fluoxetine  CKD (chronic kidney disease), stage III (HCC) Renal function is stable Monitor closely during this hospitalization  Essential hypertension Blood pressure is stable Continue metoprolol  COPD with chronic bronchitis and emphysema (HCC) Stable and not acutely exacerbated Continue as needed bronchodilator therapy  Chronic diastolic heart failure (HCC) Stable and not acutely exacerbated Continue metoprolol and torsemide      Advance Care Planning:   Code Status: Full Code   Consults: Oncology, pulmonary, neurosurgery  Family Communication: Greater than 50% of time was spent discussing patient's condition and plan of care with her at the bedside.  All questions and concerns have been addressed.  She verbalizes understanding and agrees with the plan  Severity of Illness: The appropriate patient status for this patient is INPATIENT. Inpatient status is judged to be reasonable and necessary in order to provide the required intensity of service to ensure the patient's safety. The patient's presenting symptoms, physical exam findings, and initial radiographic and laboratory data in the context of their chronic comorbidities is felt to place them at high risk for further clinical deterioration. Furthermore, it is not anticipated that the patient will be medically stable for discharge from the hospital within 2 midnights of admission.   * I certify that at the point of admission it is my clinical judgment that the patient will require inpatient hospital care spanning beyond 2 midnights from the point of admission due to high intensity of service, high risk for further deterioration and high frequency of surveillance required.*  Author: Collier Bullock, MD 11/06/2021 10:30 AM  For on call review [email or web address].

## 2021-11-06 NOTE — Assessment & Plan Note (Addendum)
Patient is morbidly obese with a BMI of 49.58 kg/m2

## 2021-11-06 NOTE — Consult Note (Signed)
[place]  [phone] [phone]  ID: Daisy [name] OB: 1950-06-26  MR#: [id number]  PIR#:[id number]  Patient Care Team: Lesleigh Noe, MD as PCP - General (Family Medicine) Magrinat, Virgie Dad, MD (Inactive) as Consulting Physician (Oncology) Adrian Prows, MD as Consulting Physician (Cardiology) Rigoberto Noel, MD as Consulting Physician (Pulmonary Disease) Lavonia Dana, MD as Consulting Physician (Nephrology)  CHIEF COMPLAINT: Lung mass with innumerable liver lesions as well as vertebral lesions highly suspicious for underlying malignancy.  INTERVAL HISTORY: Patient is a 71 year old female with a history of stage I breast cancer who recently completed 5 years of anastrozole.  She was recently admitted to [facility] in [place] and was diagnosed with acute respiratory failure, acute on chronic CHF as well as community-acquired pneumonia.  Patient states upon discharge she never felt quite back to her baseline with increasing weakness and fatigue and more recently developed increasing back pain and flank pain.  Upon arrival to the emergency room imaging revealed innumerable liver lesions as well as a lung mass highly suspicious for underlying malignancy.  She has no neurologic complaints.  She denies any recent fevers.  She has a poor appetite, but denies weight loss.  She has no cough, shortness of breath, or hemoptysis.  She denies any nausea, vomiting, constipation, or diarrhea.  She has no urinary complaints.  Patient offers no further specific complaints today.  REVIEW OF SYSTEMS:   Review of Systems  Constitutional:  Positive for malaise/fatigue. Negative for fever and weight loss.  Respiratory: Negative.  Negative for cough, hemoptysis and shortness of breath.   Cardiovascular:  Positive for chest pain. Negative for leg swelling.  Gastrointestinal:  Negative for abdominal pain and nausea.  Genitourinary:  Positive for flank  pain.  Musculoskeletal:  Positive for back pain.  Skin: Negative.  Negative for rash.  Neurological:  Positive for weakness. Negative for dizziness, focal weakness and headaches.  Psychiatric/Behavioral: Negative.  The patient is not nervous/anxious.    As per HPI. Otherwise, a complete review of systems is negative.  PAST MEDICAL HISTORY: Past Medical History:  Diagnosis Date   Acid reflux    Acute heart failure ([place]) 10/17/2019   AKI (acute kidney injury) ([place]) 10/17/2019   Arthritis    Breast cancer of upper-outer quadrant of right female breast ([place]) 11/19/2016   Chicken pox    Cholecystitis    Chronic diastolic heart failure ([place]) 01/04/2020   CKD (chronic kidney disease), stage III ([place]) 01/04/2020   Depression    Dyspnea    History of radiation therapy 01/20/17-03/02/17   left breast was treated to 50.4 Gy in 18 fractions   HLD (hyperlipidemia) 01/04/2020   Hyperlipemia    Irregular heart rate    Malignant neoplasm of upper-outer quadrant of left breast in female, estrogen receptor positive ([place]) 09/30/2016   Morbid obesity with BMI of 50.0-59.9, adult ([place]) 01/04/2020   Obesity    Peripheral vascular disease ([place])    Pneumonia    Prediabetes 01/04/2020   Urinary tract infection    Urinary tract infection with hematuria     PAST SURGICAL HISTORY: Past Surgical History:  Procedure Laterality Date   ABDOMINAL HYSTERECTOMY     BREAST LUMPECTOMY Left 11/19/2016   BREAST LUMPECTOMY WITH RADIOACTIVE SEED AND SENTINEL LYMPH NODE BIOPSY (Left)   BREAST LUMPECTOMY WITH RADIOACTIVE SEED AND SENTINEL LYMPH NODE BIOPSY Left 11/19/2016   Procedure: BREAST LUMPECTOMY WITH RADIOACTIVE SEED AND SENTINEL LYMPH NODE BIOPSY;  Surgeon: Rolm Bookbinder,  MD;  Location: [place];  Service: General;  Laterality: Left;   CESAREAN SECTION     CESAREAN SECTION     x 2   CHOLECYSTECTOMY  2007   KNEE ARTHROSCOPY     bilateral   KNEE SURGERY Bilateral    LOWER EXTREMITY ANGIOGRAM N/A 04/04/2014    Procedure: LOWER EXTREMITY ANGIOGRAM;  Surgeon: Laverda Page, MD;  Location: [facility] CATH LAB;  Service: Cardiovascular;  Laterality: N/A;   OVARIAN CYST REMOVAL     RE-EXCISION OF BREAST LUMPECTOMY Left 12/12/2016   Procedure: RE-EXCISION OF LEFT BREAST LUMPECTOMY;  Surgeon: Rolm Bookbinder, MD;  Location: [place];  Service: General;  Laterality: Left;   ROTATOR CUFF REPAIR Right    TONSILLECTOMY     WRIST SURGERY Bilateral     FAMILY HISTORY: Family History  Problem Relation Age of Onset   Heart disease Mother    Lung cancer Father    Heart disease Father    Heart disease Brother     ADVANCED DIRECTIVES (Y/N):  @ADVDIR @  HEALTH MAINTENANCE: Social History   Tobacco Use   Smoking status: Some Days    Packs/day: 1.00    Years: 30.00    Pack years: 30.00    Types: E-cigarettes, Cigarettes    Last attempt to quit: 11/02/2019    Years since quitting: 2.0   Smokeless tobacco: Never  Vaping Use   Vaping Use: Never used  Substance Use Topics   Alcohol use: No   Drug use: No     Colonoscopy:  PAP:  Bone density:  Lipid panel:  Allergies  Allergen Reactions   Elemental Sulfur Hives   Levaquin [Levofloxacin] Other (See Comments)    Body aches   Statins     Current Facility-Administered Medications  Medication Dose Route Frequency Provider Last Rate Last Admin   0.9 %  sodium chloride infusion   Intravenous Continuous Ward, Kristen N, DO 125 mL/hr at 11/06/21 1126 New Bag at 11/06/21 1126   0.9 %  sodium chloride infusion  250 mL Intravenous PRN Agbata, Tochukwu, MD       acetaminophen (TYLENOL) tablet 650 mg  650 mg Oral Q4H PRN Agbata, Tochukwu, MD       aspirin chewable tablet 81 mg  81 mg Oral Daily Agbata, Tochukwu, MD   81 mg at 11/06/21 1115   Bempedoic Acid-Ezetimibe 180-10 MG TABS 1 tablet  1 tablet Oral QHS Agbata, Tochukwu, MD       cholecalciferol (VITAMIN D3) tablet 1,000 Units  1,000 Units Oral Daily Agbata, Tochukwu, MD   1,000 Units at 11/06/21 1115    cilostazol (PLETAL) tablet 50 mg  50 mg Oral BID Agbata, Tochukwu, MD   50 mg at 11/06/21 1110   cyclobenzaprine (FLEXERIL) tablet 5 mg  5 mg Oral TID PRN Agbata, Tochukwu, MD       enoxaparin (LOVENOX) injection 70 mg  70 mg Subcutaneous Q24H Agbata, Tochukwu, MD   70 mg at 11/06/21 1111   FLUoxetine (PROZAC) capsule 20 mg  20 mg Oral Daily Agbata, Tochukwu, MD   20 mg at 11/06/21 1115   HYDROcodone-acetaminophen (NORCO/VICODIN) 5-325 MG per tablet 1 tablet  1 tablet Oral Q4H PRN Agbata, Tochukwu, MD       ipratropium-albuterol (DUONEB) 0.5-2.5 (3) MG/3ML nebulizer solution 3 mL  3 mL Nebulization Q6H PRN Agbata, Tochukwu, MD       LORazepam (ATIVAN) tablet 0.5 mg  0.5 mg Oral Q4H PRN Merlyn Lot, MD  metoprolol succinate (TOPROL-XL) 24 hr tablet 12.5 mg  12.5 mg Oral Daily Agbata, Tochukwu, MD   12.5 mg at 11/06/21 1110   ondansetron (ZOFRAN) tablet 4 mg  4 mg Oral Q6H PRN Agbata, Tochukwu, MD       Or   ondansetron (ZOFRAN) injection 4 mg  4 mg Intravenous Q6H PRN Agbata, Tochukwu, MD       pantoprazole (PROTONIX) EC tablet 20 mg  20 mg Oral Daily Agbata, Tochukwu, MD   20 mg at 11/06/21 1110   potassium chloride SA (KLOR-CON M) CR tablet 20 mEq  20 mEq Oral Daily Agbata, Tochukwu, MD   20 mEq at 11/06/21 1115   sodium chloride flush (NS) 0.9 % injection 3 mL  3 mL Intravenous Q12H Agbata, Tochukwu, MD       sodium chloride flush (NS) 0.9 % injection 3 mL  3 mL Intravenous PRN Agbata, Tochukwu, MD       torsemide (DEMADEX) tablet 20 mg  20 mg Oral Daily Agbata, Tochukwu, MD   20 mg at 11/06/21 1115   vitamin B-12 (CYANOCOBALAMIN) tablet 1,000 mcg  1,000 mcg Oral Daily Agbata, Tochukwu, MD   1,000 mcg at 11/06/21 1110   Current Outpatient Medications  Medication Sig Dispense Refill   acetaminophen (TYLENOL) 325 MG tablet Take 2 tablets (650 mg total) by mouth every 4 (four) hours as needed for headache or mild pain.     albuterol (VENTOLIN HFA) 108 (90 Base) MCG/ACT inhaler TAKE 2  PUFFS BY MOUTH EVERY 6 HOURS AS NEEDED FOR WHEEZE OR SHORTNESS OF BREATH (Patient taking differently: Inhale 2 puffs into the lungs every 6 (six) hours as needed for wheezing or shortness of breath.) 17 each 3   aspirin 81 MG chewable tablet Chew 81 mg by mouth daily.     cholecalciferol (VITAMIN D3) 25 MCG (1000 UNIT) tablet Take 1,000 Units by mouth daily.     cilostazol (PLETAL) 50 MG tablet Take 1 tablet (50 mg total) by mouth 2 (two) times daily. 180 tablet 0   cyanocobalamin 1000 MCG tablet Take 1,000 mcg by mouth daily.     dapagliflozin propanediol (FARXIGA) 5 MG TABS tablet Take 5 mg by mouth daily.     FLUoxetine (PROZAC) 20 MG capsule TAKE 1 CAPSULE BY MOUTH EVERY DAY (Patient taking differently: Take 20 mg by mouth daily.) 90 capsule 0   KLOR-CON M20 20 MEQ tablet TAKE 1 TABLET BY MOUTH EVERY DAY (Patient taking differently: Take 20 mEq by mouth daily.) 90 tablet 1   metoprolol succinate (TOPROL-XL) 25 MG 24 hr tablet Take 0.5 tablets (12.5 mg total) by mouth daily. 90 tablet 3   NEXLIZET 180-10 MG TABS TAKE 1 TABLET BY MOUTH EVERY DAY (Patient taking differently: Take 1 tablet by mouth at bedtime.) 90 tablet 3   pantoprazole (PROTONIX) 20 MG tablet Take 1 tablet (20 mg total) by mouth daily. 90 tablet 3   torsemide (DEMADEX) 10 MG tablet Take 2 tablets (20 mg total) by mouth daily.  0   predniSONE (DELTASONE) 20 MG tablet Take 2 tablets (40mg ) daily for 2 days then 1 tablet (20mg ) daily for 2 days then STOP (Patient not taking: Reported on 11/06/2021) 8 tablet 0    OBJECTIVE: Vitals:   11/06/21 0630 11/06/21 0900  BP: 120/63 131/71  Pulse: 86 96  Resp: 17 18  Temp:    SpO2: 94% 92%     Body mass index is 49.96 kg/m.    ECOG FS:1 - Symptomatic  but completely ambulatory  General: Well-developed, well-nourished, no acute distress. Eyes: Pink conjunctiva, anicteric sclera. HEENT: Normocephalic, moist mucous membranes. Lungs: No audible wheezing or coughing. Heart: Regular rate  and rhythm. Abdomen: Soft, nontender, no obvious distention. Musculoskeletal: No edema, cyanosis, or clubbing. Neuro: Alert, answering all questions appropriately. Cranial nerves grossly intact. Skin: No rashes or petechiae noted. Psych: Normal affect. Lymphatics: No cervical, calvicular, axillary or inguinal LAD.   LAB RESULTS:  Lab Results  Component Value Date   NA 139 11/05/2021   K 4.0 11/05/2021   CL 96 (L) 11/05/2021   CO2 31 11/05/2021   GLUCOSE 118 (H) 11/05/2021   BUN 20 11/05/2021   CREATININE 1.48 (H) 11/05/2021   CALCIUM 9.9 11/05/2021   PROT 6.8 11/05/2021   ALBUMIN 3.4 (L) 11/05/2021   AST 62 (H) 11/05/2021   ALT 59 (H) 11/05/2021   ALKPHOS 70 11/05/2021   BILITOT 0.8 11/05/2021   GFRNONAA 38 (L) 11/05/2021   GFRAA 42 (L) 02/14/2020    Lab Results  Component Value Date   WBC 13.5 (H) 11/05/2021   NEUTROABS 5.2 06/04/2021   HGB 15.1 (H) 11/05/2021   HCT 48.9 (H) 11/05/2021   MCV 99.0 11/05/2021   PLT 209 11/05/2021     STUDIES: DG Chest 2 View  Result Date: 11/05/2021 CLINICAL DATA:  Productive cough, wheezing EXAM: CHEST - 2 VIEW COMPARISON:  10/25/2021 FINDINGS: Left basilar opacity persists, likely representing a lungs are otherwise clear. No pneumothorax. Cardiac size is mildly enlarged. Pulmonary vascularity is normal. No acute bone abnormality. Moderate left pleural effusion with associated left basilar atelectasis or infiltrate. IMPRESSION: Stable moderate left pleural effusion with associated left basilar atelectasis or infiltrate. Given its persistence, CT imaging with contrast would be helpful to exclude the presence of a chronic infiltrate within the left lung base. Electronically Signed   By: Fidela Salisbury M.D.   On: 11/05/2021 19:51   DG Chest 2 View  Result Date: 10/25/2021 CLINICAL DATA:  Shortness of breath, hypoxia, chest congestion EXAM: CHEST - 2 VIEW COMPARISON:  10/24/2019 FINDINGS: Enlargement of cardiac silhouette with pulmonary  vascular congestion. Mediastinal contours normal with atherosclerotic calcifications aorta. LEFT pleural effusion and basilar atelectasis versus consolidation. Remaining lungs clear. No pneumothorax or acute osseous findings. IMPRESSION: Enlargement of cardiac silhouette with pulmonary vascular congestion. LEFT basilar opacity in consistent with LEFT pleural effusion and associated atelectasis versus consolidation LEFT lower lobe. Aortic Atherosclerosis (ICD10-I70.0). Electronically Signed   By: Lavonia Dana M.D.   On: 10/25/2021 15:00   DG Lumbar Spine 2-3 Views  Result Date: 10/26/2021 CLINICAL DATA:  Low back pain. EXAM: LUMBAR SPINE - 2-3 VIEW COMPARISON:  12/26/2008. FINDINGS: There is no evidence of acute lumbar spine fracture. Alignment is normal. Multilevel intervertebral disc space narrowing, degenerative endplate osteophyte formation, and facet arthropathy is noted. There is atherosclerotic calcification of the abdominal aorta. IMPRESSION: Moderate degenerative changes in the lumbar spine. No evidence of acute fracture. Electronically Signed   By: Brett Fairy M.D.   On: 10/26/2021 21:42   CT CHEST ABDOMEN PELVIS W CONTRAST  Result Date: 11/06/2021 CLINICAL DATA:  Sepsis, diarrhea EXAM: CT CHEST, ABDOMEN, AND PELVIS WITH CONTRAST TECHNIQUE: Multidetector CT imaging of the chest, abdomen and pelvis was performed following the standard protocol during bolus administration of intravenous contrast. RADIATION DOSE REDUCTION: This exam was performed according to the departmental dose-optimization program which includes automated exposure control, adjustment of the mA and/or kV according to patient size and/or use of iterative reconstruction technique.  CONTRAST:  129mL OMNIPAQUE IOHEXOL 300 MG/ML  SOLN COMPARISON:  None Available. FINDINGS: CT CHEST FINDINGS Cardiovascular: Moderate coronary artery calcification. Left anterior descending coronary artery stenting has been performed. Global cardiac size  within normal limits. No pericardial effusion. Central pulmonary arteries are enlarged in keeping with changes of pulmonary arterial hypertension. Moderate atherosclerotic calcification within the thoracic aorta. No aortic aneurysm. Mediastinum/Nodes: There is pathologic mediastinal adenopathy within the prevascular, right paratracheal, subcarinal, and aortopulmonary lymph node groups. Index lymph node measures 1.8 x 2.7 cm at axial image # 21/2 within the right paratracheal lymph node group. Visualized thyroid is unremarkable. Esophagus unremarkable. Lungs/Pleura: There is a a central pulmonary mass within the left lower lobe which is poorly delineated due to the adjacent collapsed lung, but results in obliteration of the left lower lobar pulmonary bronchus and narrowing of the left inferior pulmonary vein. This measures roughly 2.7 x 3.8 cm at axial image # 37/2. There is a suspected pleural metastasis within the left cardiophrenic angle seen at axial image # 43/2 and inferiorly at axial image # 49/2. Small left pleural effusion is present. Nodular pleural thickening anteriorly within the left costophrenic sulcus anteriorly. Left upper lobe and right lung are clear. No pneumothorax. No pleural effusion on the right. Musculoskeletal: No acute bone abnormality. No lytic or blastic bone lesion. Surgical clips are seen within the left breast. CT ABDOMEN PELVIS FINDINGS Hepatobiliary: Innumerable hypoenhancing masses are seen throughout the liver in keeping with widespread hepatic metastases. Mild resultant hepatomegaly. Cholecystectomy has been performed. No intra or extrahepatic biliary ductal dilation. Pancreas: Unremarkable Spleen: Unremarkable Adrenals/Urinary Tract: Bilateral adrenal masses are identified in keeping with bilateral adrenal metastases. The kidneys are normal in position. Moderate right and moderate to severe left renal cortical atrophy. No hydronephrosis. No intrarenal or ureteral calculi. No  enhancing intrarenal masses. The bladder is unremarkable. Stomach/Bowel: Moderate sigmoid diverticulosis. The stomach, large bowel, and small bowel are otherwise unremarkable. Appendix normal. No free intraperitoneal gas or fluid. Vascular/Lymphatic: There is extensive aortoiliac atherosclerotic calcification. No aortic aneurysm. Pathologic periportal lymph node is seen measuring 18 mm in short axis diameter at axial image # 60/2. Reproductive: Status post hysterectomy. No adnexal masses. Other: No abdominal wall hernia. Musculoskeletal: No lytic or blastic bone lesion. There is a a acute to subacute superior endplate fracture of D92 with approximately 10-20% loss of height. No retropulsion. IMPRESSION: Central obstructing mass within the left lower lobe, poorly delineated, most in keeping with a primary pulmonary malignancy with resultant obliteration of the left lower lobar pulmonary bronchus and narrowing of the left inferior pulmonary vein. Resultant collapse of the left lower lobe. 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 PET CT examination would be helpful to better delineate the primary mass and confirm the presence of pleural metastatic disease. Hepatic metastases should be easily amenable to ultrasound-guided biopsy for further evaluation. Moderate coronary artery calcification. Morphologic changes in keeping with pulmonary arterial hypertension. Moderate sigmoid diverticulosis. Acute to subacute superior endplate fracture of E26 with 10-20% loss of height. No retropulsion. This could be confirmed with MRI examination. Aortic Atherosclerosis (ICD10-I70.0). Electronically Signed   By: Fidela Salisbury M.D.   On: 11/06/2021 01:08     ASSESSMENT: Lung mass with innumerable liver lesions as well as vertebral lesions highly suspicious for underlying  malignancy.  PLAN:    Lung mass with innumerable liver lesions and vertebral lesions: CT scan results from Nov 06, 2021 reviewed independently and reported as above with central obstructing mass within the left lower lobe of the lung measuring at least 2.7 x 3.8 cm, innumerable hepatic lesions, and T12 bony lesion highly suspicious for underlying malignancy.  Case discussed with neurosurgery who has ordered MRIs of her lumbar and thoracic spine as well as consulted interventional radiology for consideration of biopsy and kyphoplasty of T12 vertebrae.  In order to obtain molecular testing, an ultrasound-guided liver biopsy may also be necessary.  Although possible, this is low suspicion for metastatic breast cancer despite her recent history.  Patient will also require PET scan to complete staging work-up upon discharge.  CT scan of the head did not reveal any metastatic disease. History of breast cancer: ER/PR positive, stage I.  Patient had low risk Oncotype therefore did not require chemotherapy.  She completed 5 years of anastrozole approximately 1 month ago. Suspect this is a second primary and unrelated to her history of breast cancer.  Will order CA 27-29 for completeness. Pain.  Possible kyphoplasty as above.  Continue IV morphine as ordered.  Appreciate consult, will follow.    Lloyd Huger, MD   11/06/2021 1:23 PM

## 2021-11-06 NOTE — ED Notes (Signed)
Pt wishes to wait to put brace on. Pt is in hospital bed sitting up eating lunch.

## 2021-11-06 NOTE — Consult Note (Signed)
Referring Physician:  No referring provider defined for this encounter.  Primary Physician:  Lesleigh Noe, MD  Chief Complaint:  back pain  History of Present Illness: 11/06/2021 Daisy [name] is a 71 y.o. female who presents with the chief complaint of back pain.  She presented with shortness of breath and possible pneumonia.  She had additional workup performed that showed multiple metastases.  She has a pathologic fracture at T12.   The symptoms are causing a significant impact on the patient's life.   Review of Systems:  A 10 point review of systems is negative, except for the pertinent positives and negatives detailed in the HPI.  Past Medical History: Past Medical History:  Diagnosis Date   Acid reflux    Acute heart failure ([place]) 10/17/2019   AKI (acute kidney injury) ([place]) 10/17/2019   Arthritis    Breast cancer of upper-outer quadrant of right female breast ([place]) 11/19/2016   Chicken pox    Cholecystitis    Chronic diastolic heart failure ([place]) 01/04/2020   CKD (chronic kidney disease), stage III ([place]) 01/04/2020   Depression    Dyspnea    History of radiation therapy 01/20/17-03/02/17   left breast was treated to 50.4 Gy in 18 fractions   HLD (hyperlipidemia) 01/04/2020   Hyperlipemia    Irregular heart rate    Malignant neoplasm of upper-outer quadrant of left breast in female, estrogen receptor positive ([place]) 09/30/2016   Morbid obesity with BMI of 50.0-59.9, adult ([place]) 01/04/2020   Obesity    Peripheral vascular disease ([place])    Pneumonia    Prediabetes 01/04/2020   Urinary tract infection    Urinary tract infection with hematuria     Past Surgical History: Past Surgical History:  Procedure Laterality Date   ABDOMINAL HYSTERECTOMY     BREAST LUMPECTOMY Left 11/19/2016   BREAST LUMPECTOMY WITH RADIOACTIVE SEED AND SENTINEL LYMPH NODE BIOPSY (Left)   BREAST LUMPECTOMY WITH RADIOACTIVE SEED AND SENTINEL LYMPH NODE BIOPSY Left 11/19/2016   Procedure: BREAST  LUMPECTOMY WITH RADIOACTIVE SEED AND SENTINEL LYMPH NODE BIOPSY;  Surgeon: Rolm Bookbinder, MD;  Location: [facility];  Service: General;  Laterality: Left;   CESAREAN SECTION     CESAREAN SECTION     x 2   CHOLECYSTECTOMY  2007   KNEE ARTHROSCOPY     bilateral   KNEE SURGERY Bilateral    LOWER EXTREMITY ANGIOGRAM N/A 04/04/2014   Procedure: LOWER EXTREMITY ANGIOGRAM;  Surgeon: Laverda Page, MD;  Location: [facility] CATH LAB;  Service: Cardiovascular;  Laterality: N/A;   OVARIAN CYST REMOVAL     RE-EXCISION OF BREAST LUMPECTOMY Left 12/12/2016   Procedure: RE-EXCISION OF LEFT BREAST LUMPECTOMY;  Surgeon: Rolm Bookbinder, MD;  Location: [facility];  Service: General;  Laterality: Left;   ROTATOR CUFF REPAIR Right    TONSILLECTOMY     WRIST SURGERY Bilateral     Allergies: Allergies as of 11/05/2021 - Review Complete 11/05/2021  Allergen Reaction Noted   Elemental sulfur Hives 05/25/2011   Levaquin [levofloxacin] Other (See Comments) 11/13/2016   Statins  01/04/2020    Medications:  Current Facility-Administered Medications:    0.9 %  sodium chloride infusion, , Intravenous, Continuous, Ward, Kristen N, DO, Last Rate: 125 mL/hr at 11/06/21 1628, New Bag at 11/06/21 1628   0.9 %  sodium chloride infusion, 250 mL, Intravenous, PRN, Agbata, Tochukwu, MD   acetaminophen (TYLENOL) tablet 650 mg, 650 mg, Oral, Q4H PRN, Agbata, Tochukwu, MD   aspirin chewable tablet  81 mg, 81 mg, Oral, Daily, Agbata, Tochukwu, MD, 81 mg at 11/06/21 1115   Bempedoic Acid-Ezetimibe 180-10 MG TABS 1 tablet, 1 tablet, Oral, QHS, Agbata, Tochukwu, MD   cholecalciferol (VITAMIN D3) tablet 1,000 Units, 1,000 Units, Oral, Daily, Agbata, Tochukwu, MD, 1,000 Units at 11/06/21 1115   cilostazol (PLETAL) tablet 50 mg, 50 mg, Oral, BID, Agbata, Tochukwu, MD, 50 mg at 11/06/21 1110   cyclobenzaprine (FLEXERIL) tablet 5 mg, 5 mg, Oral, TID PRN, Agbata, Tochukwu, MD   enoxaparin (LOVENOX) injection 70 mg, 70 mg, Subcutaneous,  Q24H, Agbata, Tochukwu, MD, 70 mg at 11/06/21 1111   FLUoxetine (PROZAC) capsule 20 mg, 20 mg, Oral, Daily, Agbata, Tochukwu, MD, 20 mg at 11/06/21 1115   HYDROcodone-acetaminophen (NORCO/VICODIN) 5-325 MG per tablet 1 tablet, 1 tablet, Oral, Q4H PRN, Agbata, Tochukwu, MD   ipratropium-albuterol (DUONEB) 0.5-2.5 (3) MG/3ML nebulizer solution 3 mL, 3 mL, Nebulization, Q6H PRN, Agbata, Tochukwu, MD   LORazepam (ATIVAN) tablet 0.5 mg, 0.5 mg, Oral, Q4H PRN, Merlyn Lot, MD, 0.5 mg at 11/06/21 1342   metoprolol succinate (TOPROL-XL) 24 hr tablet 12.5 mg, 12.5 mg, Oral, Daily, Agbata, Tochukwu, MD, 12.5 mg at 11/06/21 1110   ondansetron (ZOFRAN) tablet 4 mg, 4 mg, Oral, Q6H PRN **OR** ondansetron (ZOFRAN) injection 4 mg, 4 mg, Intravenous, Q6H PRN, Agbata, Tochukwu, MD   pantoprazole (PROTONIX) EC tablet 20 mg, 20 mg, Oral, Daily, Agbata, Tochukwu, MD, 20 mg at 11/06/21 1110   potassium chloride SA (KLOR-CON M) CR tablet 20 mEq, 20 mEq, Oral, Daily, Agbata, Tochukwu, MD, 20 mEq at 11/06/21 1115   sodium chloride flush (NS) 0.9 % injection 3 mL, 3 mL, Intravenous, Q12H, Agbata, Tochukwu, MD   sodium chloride flush (NS) 0.9 % injection 3 mL, 3 mL, Intravenous, PRN, Agbata, Tochukwu, MD   torsemide (DEMADEX) tablet 20 mg, 20 mg, Oral, Daily, Agbata, Tochukwu, MD, 20 mg at 11/06/21 1115   vitamin B-12 (CYANOCOBALAMIN) tablet 1,000 mcg, 1,000 mcg, Oral, Daily, Agbata, Tochukwu, MD, 1,000 mcg at 11/06/21 1110   Social History: Social History   Tobacco Use   Smoking status: Some Days    Packs/day: 1.00    Years: 30.00    Pack years: 30.00    Types: E-cigarettes, Cigarettes    Last attempt to quit: 11/02/2019    Years since quitting: 2.0   Smokeless tobacco: Never  Vaping Use   Vaping Use: Never used  Substance Use Topics   Alcohol use: No   Drug use: No    Family Medical History: Family History  Problem Relation Age of Onset   Heart disease Mother    Lung cancer Father    Heart  disease Father    Heart disease Brother     Physical Examination: Vitals:   11/06/21 1330 11/06/21 1525  BP: (!) 98/49 (!) 99/47  Pulse: 79 89  Resp: 17 20  Temp: 98.4 F (36.9 C) 97.8 F (36.6 C)  SpO2: 92% 93%    General: Patient is well developed, well nourished, calm, collected, and in no apparent distress.  Psychiatric: Patient is non-anxious.  Head:  Pupils equal, round, and reactive to light.  ENT:  Oral mucosa appears well hydrated.  Neck:   Supple.  Full range of motion.  Respiratory: Patient is breathing without any difficulty on supplemental oxygenation.  Extremities: No edema.  Vascular: Palpable pulses in dorsal pedal vessels.  Skin:   On exposed skin, there are no abnormal skin lesions.  NEUROLOGICAL:  General: In no acute distress.  Awake, alert, oriented to person, place, and time.  Pupils equal round and reactive to light.  Facial tone is symmetric.  Tongue protrusion is midline.  There is no pronator drift.  Strength: Side Biceps Triceps Deltoid Interossei Grip Wrist Ext. Wrist Flex.  R 5 5 5 5 5 5 5   L 5 5 5 5 5 5 5    Side Iliopsoas Quads Hamstring PF DF EHL  R 5 5 5 5 5 5   L 5 5 5 5 5 5     Bilateral upper and lower extremity sensation is intact to light touch. Reflexes are 1+ and symmetric at the biceps, triceps, brachioradialis, patella and achilles. Hoffman's is absent.  Clonus is not present.  Toes are down-going.    Gait is untested.   Imaging: MRI T spine 11/06/21 IMPRESSION: 1. Widespread osseous metastases throughout the spine and visualized pelvis. 2. Pathologic T12 compression fracture with 30% height loss and suspected small volume ventral epidural tumor. No significant spinal stenosis. 3. Diffuse thoracic disc and facet degeneration as above.     Electronically Signed   By: Logan Bores M.D.   On: 11/06/2021 15:06  CT CAP with contrast 11/05/21 IMPRESSION: Central obstructing mass within the left lower lobe,  poorly delineated, most in keeping with a primary pulmonary malignancy with resultant obliteration of the left lower lobar pulmonary bronchus and narrowing of the left inferior pulmonary vein. Resultant collapse of the left lower lobe. 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 PET CT examination would be helpful to better delineate the primary mass and confirm the presence of pleural metastatic disease. Hepatic metastases should be easily amenable to ultrasound-guided biopsy for further evaluation.   Moderate coronary artery calcification.   Morphologic changes in keeping with pulmonary arterial hypertension.   Moderate sigmoid diverticulosis.   Acute to subacute superior endplate fracture of H73 with 10-20% loss of height. No retropulsion. This could be confirmed with MRI examination.   Aortic Atherosclerosis (ICD10-I70.0).     Electronically Signed   By: Fidela Salisbury M.D.   On: 11/06/2021 01:08  I have personally reviewed the images and agree with the above interpretation.  Labs:    Latest Ref Rng & Units 11/05/2021    7:16 PM 10/28/2021    1:32 AM 10/27/2021    1:10 AM  CBC  WBC 4.0 - 10.5 K/uL 13.5   9.9   10.4    Hemoglobin 12.0 - 15.0 g/dL 15.1   14.2   14.9    Hematocrit 36.0 - 46.0 % 48.9   46.2   48.7    Platelets 150 - 400 K/uL 209   237   230         Assessment and Plan: Ms. Boutin is a pleasant 71 y.o. female with pathologic compression fracture.  She has a large LLL lesion that is suspicious for cancer, and has multiple areas concerning for metastases.    There is no indication for surgical intervention, but she may benefit from kyphoplasty with biopsy at T12.    - TLSO while OOB - Pain control - OK for DVT PPX   Kenyan Karnes K. Izora Ribas MD, [place] Dept[facility] of Neurosurgery

## 2021-11-06 NOTE — Assessment & Plan Note (Addendum)
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  Status post kyphoplasty by IR on 11/12/2021.   -- Brace when out of bed -- Pain control as needed

## 2021-11-06 NOTE — ED Notes (Signed)
Pt sleeping when RN walked in. Pt helped to get comfortable in bed. Denies further needs.

## 2021-11-06 NOTE — ED Notes (Signed)
Patient given water and crackers at this time

## 2021-11-07 ENCOUNTER — Encounter: Payer: Self-pay | Admitting: Family Medicine

## 2021-11-07 DIAGNOSIS — N1831 Chronic kidney disease, stage 3a: Secondary | ICD-10-CM

## 2021-11-07 DIAGNOSIS — C50412 Malignant neoplasm of upper-outer quadrant of left female breast: Secondary | ICD-10-CM | POA: Diagnosis not present

## 2021-11-07 DIAGNOSIS — Z6841 Body Mass Index (BMI) 40.0 and over, adult: Secondary | ICD-10-CM

## 2021-11-07 DIAGNOSIS — J449 Chronic obstructive pulmonary disease, unspecified: Secondary | ICD-10-CM

## 2021-11-07 DIAGNOSIS — E662 Morbid (severe) obesity with alveolar hypoventilation: Secondary | ICD-10-CM

## 2021-11-07 DIAGNOSIS — R918 Other nonspecific abnormal finding of lung field: Secondary | ICD-10-CM

## 2021-11-07 DIAGNOSIS — Z170 Estrogen receptor positive status [ER+]: Secondary | ICD-10-CM

## 2021-11-07 DIAGNOSIS — F411 Generalized anxiety disorder: Secondary | ICD-10-CM

## 2021-11-07 DIAGNOSIS — I5032 Chronic diastolic (congestive) heart failure: Secondary | ICD-10-CM

## 2021-11-07 DIAGNOSIS — I10 Essential (primary) hypertension: Secondary | ICD-10-CM

## 2021-11-07 DIAGNOSIS — S22080K Wedge compression fracture of T11-T12 vertebra, subsequent encounter for fracture with nonunion: Secondary | ICD-10-CM | POA: Diagnosis not present

## 2021-11-07 LAB — CBC
HCT: 44.5 % (ref 36.0–46.0)
Hemoglobin: 13.6 g/dL (ref 12.0–15.0)
MCH: 31.1 pg (ref 26.0–34.0)
MCHC: 30.6 g/dL (ref 30.0–36.0)
MCV: 101.6 fL — ABNORMAL HIGH (ref 80.0–100.0)
Platelets: 193 10*3/uL (ref 150–400)
RBC: 4.38 MIL/uL (ref 3.87–5.11)
RDW: 13 % (ref 11.5–15.5)
WBC: 12.8 10*3/uL — ABNORMAL HIGH (ref 4.0–10.5)
nRBC: 0 % (ref 0.0–0.2)

## 2021-11-07 LAB — BASIC METABOLIC PANEL
Anion gap: 7 (ref 5–15)
BUN: 18 mg/dL (ref 8–23)
CO2: 34 mmol/L — ABNORMAL HIGH (ref 22–32)
Calcium: 9.2 mg/dL (ref 8.9–10.3)
Chloride: 102 mmol/L (ref 98–111)
Creatinine, Ser: 1.23 mg/dL — ABNORMAL HIGH (ref 0.44–1.00)
GFR, Estimated: 47 mL/min — ABNORMAL LOW (ref >60)
Glucose, Bld: 113 mg/dL — ABNORMAL HIGH (ref 70–99)
Potassium: 4.6 mmol/L (ref 3.5–5.1)
Sodium: 143 mmol/L (ref 135–145)

## 2021-11-07 LAB — MRSA NEXT GEN BY PCR, NASAL: MRSA by PCR Next Gen: NOT DETECTED

## 2021-11-07 MED ORDER — ENOXAPARIN SODIUM 80 MG/0.8ML IJ SOSY
70.0000 mg | PREFILLED_SYRINGE | INTRAMUSCULAR | Status: DC
  Administered 2021-11-07 – 2021-11-10 (×4): 70 mg via SUBCUTANEOUS
  Filled 2021-11-07 (×4): qty 0.7

## 2021-11-07 MED ORDER — METRONIDAZOLE 500 MG/100ML IV SOLN
500.0000 mg | Freq: Two times a day (BID) | INTRAVENOUS | Status: DC
  Administered 2021-11-07 – 2021-11-10 (×7): 500 mg via INTRAVENOUS
  Filled 2021-11-07 (×7): qty 100

## 2021-11-07 MED ORDER — EZETIMIBE 10 MG PO TABS
10.0000 mg | ORAL_TABLET | Freq: Every day | ORAL | Status: DC
  Administered 2021-11-07 – 2021-11-17 (×11): 10 mg via ORAL
  Filled 2021-11-07 (×12): qty 1

## 2021-11-07 NOTE — Progress Notes (Signed)
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  Interventional radiology will do a kyphoplasty but will need insurance authorization first.  Likely this will be done next week.  Holding aspirin and Pletal for now.  Continue Lovenox until 24 hours before the procedure.  Mass of lower lobe of left lung Patient has a mass of the lung, liver and osseous metastases.  Patient does have a history of breast cancer.  Likely will end up getting a liver biopsy at the time of the kyphoplasty.  Malignant neoplasm of upper-outer quadrant of left breast in female, estrogen receptor positive ([place]) Patient with a known history of left breast cancer status post lumpectomy and radiation treatment. Patient completed a 5 year course of Anastrazole  Morbid obesity with BMI of 50.0-59.9, adult ([place]) Patient is morbidly obese with a BMI of 49.96 kg/m2   Obesity hypoventilation syndrome (HCC) Chronic 2 L of oxygen and BiPAP at night  Generalized anxiety disorder Continue fluoxetine  CKD (chronic kidney disease), stage III (HCC) CKD stage IIIa  Essential hypertension Continue metoprolol  COPD with chronic bronchitis and emphysema (HCC) Stable and not acutely exacerbated Continue as needed bronchodilator therapy  Chronic diastolic heart failure (HCC) Stable and not acutely exacerbated Continue metoprolol and torsemide        Subjective: Patient coming in with back pain.  Family states that they could not get her in and out of the house very easily.  Patient able to straight leg raise.  When she does not move she does not have back pain.  Found to have a lung mass, liver metastases and osseous metastases on imaging  Physical Exam: Vitals:   11/06/21 2008 11/06/21 2337 11/07/21 0603  11/07/21 0757  BP: 123/64 (!) 118/55 135/69 (!) 148/62  Pulse: 87 92 91 91  Resp: 18 18 18 18   Temp: 98.1 F (36.7 C) 97.8 F (36.6 C) 97.9 F (36.6 C) 97.6 F (36.4 C)  TempSrc:      SpO2: 93% 91% 92% 94%  Weight:      Height:       Physical Exam HENT:     Head: Normocephalic.     Mouth/Throat:     Pharynx: No oropharyngeal exudate.  Eyes:     General: Lids are normal.     Conjunctiva/sclera: Conjunctivae normal.  Cardiovascular:     Rate and Rhythm: Normal rate and regular rhythm.     Heart sounds: Normal heart sounds, S1 normal and S2 normal.  Pulmonary:     Breath sounds: No decreased breath sounds, wheezing, rhonchi or rales.  Abdominal:     Palpations: Abdomen is soft.     Tenderness: There is no abdominal tenderness.  Musculoskeletal:     Right lower leg: Swelling present.     Left lower leg: Swelling present.  Skin:    General: Skin is warm.     Findings: No rash.  Neurological:     Mental Status: She is alert and oriented to person, place, and time.    Data Reviewed: Creatinine 1.23, white blood cell count 12.8 CT scan of the chest abdomen pelvis shows a central obstructing mass within the left lower lobe left pleural metastases, hepatic metastases and adrenal metastases and also osseous metastases  Family Communication: Spoke with family at  bedside  Disposition: Status is: Inpatient Remains inpatient appropriate because: Will need tissue diagnosis and kyphoplasty prior to disposition  Planned Discharge Destination: Home   Author: Loletha Grayer, MD 11/07/2021 3:13 PM  For on call review [email or web address].

## 2021-11-07 NOTE — H&P (Signed)
Chief Complaint: Patient was seen in consultation today for pathologic fracture of T12 and liver lesion biopsy at the request of Meade Maw, MD and Delight Hoh, MD  Referring Physician(s): Meade Maw, MD; Delight Hoh, MD  Supervising Physician: Juliet Rude  Patient Status: [place] - In-pt  History of Present Illness: Daisy [name] is a 71 y.o. female with past medical history of AKI, acute heart failure, CHF, CKD stage III, neoplasm of left breast with radiation, HLD, PVD and prediabetes.  Patient presented to ED complaining of back pain and shortness of breath.  Work-up showed multiple metastases throughout spine and pelvis as well as pathologic fracture of T12 with 30% height loss and suspected small volume ventral epidural tumor.  CT chest abdomen pelvis 11/06/2021 demonstrates obstructing mass of the left node suspicious for pulmonary malignancy with collapse of left lower lobe, widespread hepatic metastases and bilateral adrenal metastases.  Patient was referred to IR by Dr. Meade Maw, neurosurgery, for kyphoplasty and biopsy at T12. Delight Hoh, MD is also requesting liver lesion biopsy for genetic receptor testing.  Procedure was approved by Dr. Denna Haggard. Procedure is tentatively scheduled the week of 11/11/21 pending insurance authorization.   MR thoracic spine 11/06/2021: IMPRESSION: 1. Widespread osseous metastases throughout the spine and visualized pelvis. 2. Pathologic T12 compression fracture with 30% height loss and suspected small volume ventral epidural tumor. No significant spinal stenosis. 3. Diffuse thoracic disc and facet degeneration as above.  Past Medical History:  Diagnosis Date   Acid reflux    Acute heart failure ([place]) 10/17/2019   AKI (acute kidney injury) ([place]) 10/17/2019   Arthritis    Breast cancer of upper-outer quadrant of right female breast ([place]) 11/19/2016   Chicken pox    Cholecystitis    Chronic diastolic  heart failure ([place]) 01/04/2020   CKD (chronic kidney disease), stage III ([place]) 01/04/2020   Depression    Dyspnea    History of radiation therapy 01/20/17-03/02/17   left breast was treated to 50.4 Gy in 18 fractions   HLD (hyperlipidemia) 01/04/2020   Hyperlipemia    Irregular heart rate    Malignant neoplasm of upper-outer quadrant of left breast in female, estrogen receptor positive ([place]) 09/30/2016   Morbid obesity with BMI of 50.0-59.9, adult ([place]) 01/04/2020   Obesity    Peripheral vascular disease ([place])    Pneumonia    Prediabetes 01/04/2020   Urinary tract infection    Urinary tract infection with hematuria     Past Surgical History:  Procedure Laterality Date   ABDOMINAL HYSTERECTOMY     BREAST LUMPECTOMY Left 11/19/2016   BREAST LUMPECTOMY WITH RADIOACTIVE SEED AND SENTINEL LYMPH NODE BIOPSY (Left)   BREAST LUMPECTOMY WITH RADIOACTIVE SEED AND SENTINEL LYMPH NODE BIOPSY Left 11/19/2016   Procedure: BREAST LUMPECTOMY WITH RADIOACTIVE SEED AND SENTINEL LYMPH NODE BIOPSY;  Surgeon: Rolm Bookbinder, MD;  Location: [facility];  Service: General;  Laterality: Left;   CESAREAN SECTION     CESAREAN SECTION     x 2   CHOLECYSTECTOMY  2007   KNEE ARTHROSCOPY     bilateral   KNEE SURGERY Bilateral    LOWER EXTREMITY ANGIOGRAM N/A 04/04/2014   Procedure: LOWER EXTREMITY ANGIOGRAM;  Surgeon: Laverda Page, MD;  Location: [facility] CATH LAB;  Service: Cardiovascular;  Laterality: N/A;   OVARIAN CYST REMOVAL     RE-EXCISION OF BREAST LUMPECTOMY Left 12/12/2016   Procedure: RE-EXCISION OF LEFT BREAST LUMPECTOMY;  Surgeon: Rolm Bookbinder, MD;  Location: [facility];  Service: General;  Laterality: Left;   ROTATOR CUFF REPAIR Right    TONSILLECTOMY     WRIST SURGERY Bilateral     Allergies: Elemental sulfur, Levaquin [levofloxacin], and Statins  Medications: Prior to Admission medications   Medication Sig Start Date End Date Taking? Authorizing Provider  acetaminophen (TYLENOL) 325 MG tablet  Take 2 tablets (650 mg total) by mouth every 4 (four) hours as needed for headache or mild pain. 11/02/19  Yes Guilford Shi, MD  albuterol (VENTOLIN HFA) 108 (90 Base) MCG/ACT inhaler TAKE 2 PUFFS BY MOUTH EVERY 6 HOURS AS NEEDED FOR WHEEZE OR SHORTNESS OF BREATH Patient taking differently: Inhale 2 puffs into the lungs every 6 (six) hours as needed for wheezing or shortness of breath. 08/06/21  Yes Lesleigh Noe, MD  aspirin 81 MG chewable tablet Chew 81 mg by mouth daily.   Yes [provider]  cholecalciferol (VITAMIN D3) 25 MCG (1000 UNIT) tablet Take 1,000 Units by mouth daily.   Yes [provider]  cilostazol (PLETAL) 50 MG tablet Take 1 tablet (50 mg total) by mouth 2 (two) times daily. 11/01/21  Yes Lesleigh Noe, MD  cyanocobalamin 1000 MCG tablet Take 1,000 mcg by mouth daily.   Yes [provider]  dapagliflozin propanediol (FARXIGA) 5 MG TABS tablet Take 5 mg by mouth daily.   Yes [provider]  FLUoxetine (PROZAC) 20 MG capsule TAKE 1 CAPSULE BY MOUTH EVERY DAY Patient taking differently: Take 20 mg by mouth daily. 08/06/21  Yes Lesleigh Noe, MD  KLOR-CON M20 20 MEQ tablet TAKE 1 TABLET BY MOUTH EVERY DAY Patient taking differently: Take 20 mEq by mouth daily. 05/23/21  Yes Lesleigh Noe, MD  metoprolol succinate (TOPROL-XL) 25 MG 24 hr tablet Take 0.5 tablets (12.5 mg total) by mouth daily. 03/20/21  Yes Lesleigh Noe, MD  NEXLIZET 180-10 MG TABS TAKE 1 TABLET BY MOUTH EVERY DAY Patient taking differently: Take 1 tablet by mouth at bedtime. 04/08/21  Yes Adrian Prows, MD  pantoprazole (PROTONIX) 20 MG tablet Take 1 tablet (20 mg total) by mouth daily. 03/20/21  Yes Lesleigh Noe, MD  torsemide (DEMADEX) 10 MG tablet Take 2 tablets (20 mg total) by mouth daily. 10/29/21  Yes Domenic Polite, MD  predniSONE (DELTASONE) 20 MG tablet Take 2 tablets (40mg ) daily for 2 days then 1 tablet (20mg ) daily for 2 days then STOP Patient not taking:  Reported on 11/06/2021 10/29/21   Domenic Polite, MD     Family History  Problem Relation Age of Onset   Heart disease Mother    Lung cancer Father    Heart disease Father    Heart disease Brother     Social History   Socioeconomic History   Marital status: Single    Spouse name: Not on file   Number of children: 2   Years of education: high school   Highest education level: Not on file  Occupational History   Not on file  Tobacco Use   Smoking status: Some Days    Packs/day: 1.00    Years: 30.00    Pack years: 30.00    Types: E-cigarettes, Cigarettes    Last attempt to quit: 11/02/2019    Years since quitting: 2.0   Smokeless tobacco: Never  Vaping Use   Vaping Use: Never used  Substance and Sexual Activity   Alcohol use: No   Drug use: No   Sexual activity: Not Currently  Other Topics Concern  Not on file  Social History Narrative   07/24/20   From: the area   Living: with [name] (daughter) and her daughter-in-law   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      Family: [place] and [name] - no grandchildren      Enjoys: Programmer, systems (lab, Barista), play cards      Exercise: not currently    Diet: limits fried foods, tries to follow heart healthy - baked and grilled chicken, limit canned products      Safety   Seat belts: Yes    Guns: Yes  and secure   Safe in relationships: Yes    Social Determinants of Health   Financial Resource Strain: Not on file  Food Insecurity: Not on file  Transportation Needs: Not on file  Physical Activity: Not on file  Stress: Not on file  Social Connections: Not on file      Review of Systems: A 12 point ROS discussed and pertinent positives are indicated in the HPI above.  All other systems are negative.  Review of Systems  Constitutional:  Positive for appetite change. Negative for chills and fever.  Respiratory:  Negative for shortness of breath.   Cardiovascular:  Positive for leg  swelling. Negative for chest pain.  Gastrointestinal:  Negative for abdominal pain, nausea and vomiting.  Musculoskeletal:  Positive for back pain.  Neurological:  Positive for weakness. Negative for dizziness and headaches.   Vital Signs: BP (!) 148/62 (BP Location: Right Arm)   Pulse 91   Temp 97.6 F (36.4 C)   Resp 18   Ht 5\' 7"  (1.702 m)   Wt (!) 319 lb (144.7 kg)   SpO2 94%   BMI 49.96 kg/m   Physical Exam Vitals reviewed.  Constitutional:      General: She is not in acute distress.    Appearance: Normal appearance. She is ill-appearing.  HENT:     Head: Normocephalic and atraumatic.     Nose:     Comments: O2 via [place]    Mouth/Throat:     Mouth: Mucous membranes are moist.     Pharynx: Oropharynx is clear.  Eyes:     Extraocular Movements: Extraocular movements intact.     Pupils: Pupils are equal, round, and reactive to light.  Cardiovascular:     Rate and Rhythm: Normal rate and regular rhythm.     Pulses: Normal pulses.     Heart sounds: Normal heart sounds.  Pulmonary:     Effort: Pulmonary effort is normal. No respiratory distress.     Breath sounds: Wheezing and rhonchi present.  Abdominal:     General: Bowel sounds are normal. There is distension.     Tenderness: There is no abdominal tenderness. There is no guarding.  Musculoskeletal:     Right lower leg: No edema.     Left lower leg: No edema.  Skin:    General: Skin is warm and dry.  Neurological:     Mental Status: She is alert and oriented to person, place, and time.  Psychiatric:        Mood and Affect: Mood normal.        Behavior: Behavior normal.        Thought Content: Thought content normal.        Judgment: Judgment normal.    Imaging: DG Chest 2 View  Result Date: 11/05/2021 CLINICAL DATA:  Productive cough, wheezing EXAM: CHEST - 2 VIEW  COMPARISON:  10/25/2021 FINDINGS: Left basilar opacity persists, likely representing a lungs are otherwise clear. No pneumothorax. Cardiac size is  mildly enlarged. Pulmonary vascularity is normal. No acute bone abnormality. Moderate left pleural effusion with associated left basilar atelectasis or infiltrate. IMPRESSION: Stable moderate left pleural effusion with associated left basilar atelectasis or infiltrate. Given its persistence, CT imaging with contrast would be helpful to exclude the presence of a chronic infiltrate within the left lung base. Electronically Signed   By: Fidela Salisbury M.D.   On: 11/05/2021 19:51   DG Chest 2 View  Result Date: 10/25/2021 CLINICAL DATA:  Shortness of breath, hypoxia, chest congestion EXAM: CHEST - 2 VIEW COMPARISON:  10/24/2019 FINDINGS: Enlargement of cardiac silhouette with pulmonary vascular congestion. Mediastinal contours normal with atherosclerotic calcifications aorta. LEFT pleural effusion and basilar atelectasis versus consolidation. Remaining lungs clear. No pneumothorax or acute osseous findings. IMPRESSION: Enlargement of cardiac silhouette with pulmonary vascular congestion. LEFT basilar opacity in consistent with LEFT pleural effusion and associated atelectasis versus consolidation LEFT lower lobe. Aortic Atherosclerosis (ICD10-I70.0). Electronically Signed   By: Lavonia Dana M.D.   On: 10/25/2021 15:00   DG Lumbar Spine 2-3 Views  Result Date: 10/26/2021 CLINICAL DATA:  Low back pain. EXAM: LUMBAR SPINE - 2-3 VIEW COMPARISON:  12/26/2008. FINDINGS: There is no evidence of acute lumbar spine fracture. Alignment is normal. Multilevel intervertebral disc space narrowing, degenerative endplate osteophyte formation, and facet arthropathy is noted. There is atherosclerotic calcification of the abdominal aorta. IMPRESSION: Moderate degenerative changes in the lumbar spine. No evidence of acute fracture. Electronically Signed   By: Brett Fairy M.D.   On: 10/26/2021 21:42   MR THORACIC SPINE W WO CONTRAST  Result Date: 11/06/2021 CLINICAL DATA:  Bone lesion, thoracic spine.  Compression fracture.  EXAM: MRI THORACIC WITHOUT AND WITH CONTRAST TECHNIQUE: Multiplanar and multiecho pulse sequences of the thoracic spine were obtained without and with intravenous contrast. CONTRAST:  73mL GADAVIST GADOBUTROL 1 MMOL/ML IV SOLN COMPARISON:  CT chest, abdomen, and pelvis 11/06/2021 FINDINGS: Alignment:  Trace anterolisthesis of T4 on T5. Vertebrae: Widespread T1 hypointense, enhancing lesions at every level in the thoracic spine involving the vertebral bodies and posterior elements. Pathologic T12 compression fracture with 30% vertebral body height loss. Mild bulging of the posterior cortex of the T12 vertebral body with suspected small volume ventral epidural tumor not resulting in significant spinal stenosis or spinal cord mass effect. Widespread bone lesions throughout the visualized portions of the cervical spine, lumbar spine, and pelvis. Cord:  Normal cord signal.  No abnormal intradural enhancement. Paraspinal and other soft tissues: Obstructing left lower lobe pulmonary mass, mediastinal lymphadenopathy, and bilateral adrenal masses as shown on today's earlier CT. Asymmetric left renal atrophy. Disc levels: Diffuse thoracic disc and facet degeneration. Central/paracentral disc protrusions from T5-6 to T10-11, largest at T5-6 and T8-9 where there is mild ventral cord flattening without significant generalized spinal stenosis. IMPRESSION: 1. Widespread osseous metastases throughout the spine and visualized pelvis. 2. Pathologic T12 compression fracture with 30% height loss and suspected small volume ventral epidural tumor. No significant spinal stenosis. 3. Diffuse thoracic disc and facet degeneration as above. Electronically Signed   By: Logan Bores M.D.   On: 11/06/2021 15:06   CT CHEST ABDOMEN PELVIS W CONTRAST  Result Date: 11/06/2021 CLINICAL DATA:  Sepsis, diarrhea EXAM: CT CHEST, ABDOMEN, AND PELVIS WITH CONTRAST TECHNIQUE: Multidetector CT imaging of the chest, abdomen and pelvis was performed  following the standard protocol during bolus administration of intravenous  contrast. RADIATION DOSE REDUCTION: This exam was performed according to the departmental dose-optimization program which includes automated exposure control, adjustment of the mA and/or kV according to patient size and/or use of iterative reconstruction technique. CONTRAST:  128mL OMNIPAQUE IOHEXOL 300 MG/ML  SOLN COMPARISON:  None Available. FINDINGS: CT CHEST FINDINGS Cardiovascular: Moderate coronary artery calcification. Left anterior descending coronary artery stenting has been performed. Global cardiac size within normal limits. No pericardial effusion. Central pulmonary arteries are enlarged in keeping with changes of pulmonary arterial hypertension. Moderate atherosclerotic calcification within the thoracic aorta. No aortic aneurysm. Mediastinum/Nodes: There is pathologic mediastinal adenopathy within the prevascular, right paratracheal, subcarinal, and aortopulmonary lymph node groups. Index lymph node measures 1.8 x 2.7 cm at axial image # 21/2 within the right paratracheal lymph node group. Visualized thyroid is unremarkable. Esophagus unremarkable. Lungs/Pleura: There is a a central pulmonary mass within the left lower lobe which is poorly delineated due to the adjacent collapsed lung, but results in obliteration of the left lower lobar pulmonary bronchus and narrowing of the left inferior pulmonary vein. This measures roughly 2.7 x 3.8 cm at axial image # 37/2. There is a suspected pleural metastasis within the left cardiophrenic angle seen at axial image # 43/2 and inferiorly at axial image # 49/2. Small left pleural effusion is present. Nodular pleural thickening anteriorly within the left costophrenic sulcus anteriorly. Left upper lobe and right lung are clear. No pneumothorax. No pleural effusion on the right. Musculoskeletal: No acute bone abnormality. No lytic or blastic bone lesion. Surgical clips are seen within the left  breast. CT ABDOMEN PELVIS FINDINGS Hepatobiliary: Innumerable hypoenhancing masses are seen throughout the liver in keeping with widespread hepatic metastases. Mild resultant hepatomegaly. Cholecystectomy has been performed. No intra or extrahepatic biliary ductal dilation. Pancreas: Unremarkable Spleen: Unremarkable Adrenals/Urinary Tract: Bilateral adrenal masses are identified in keeping with bilateral adrenal metastases. The kidneys are normal in position. Moderate right and moderate to severe left renal cortical atrophy. No hydronephrosis. No intrarenal or ureteral calculi. No enhancing intrarenal masses. The bladder is unremarkable. Stomach/Bowel: Moderate sigmoid diverticulosis. The stomach, large bowel, and small bowel are otherwise unremarkable. Appendix normal. No free intraperitoneal gas or fluid. Vascular/Lymphatic: There is extensive aortoiliac atherosclerotic calcification. No aortic aneurysm. Pathologic periportal lymph node is seen measuring 18 mm in short axis diameter at axial image # 60/2. Reproductive: Status post hysterectomy. No adnexal masses. Other: No abdominal wall hernia. Musculoskeletal: No lytic or blastic bone lesion. There is a a acute to subacute superior endplate fracture of O17 with approximately 10-20% loss of height. No retropulsion. IMPRESSION: Central obstructing mass within the left lower lobe, poorly delineated, most in keeping with a primary pulmonary malignancy with resultant obliteration of the left lower lobar pulmonary bronchus and narrowing of the left inferior pulmonary vein. Resultant collapse of the left lower lobe. 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 PET CT examination would be helpful to better delineate the primary mass and confirm the presence of pleural metastatic disease. Hepatic metastases should be easily  amenable to ultrasound-guided biopsy for further evaluation. Moderate coronary artery calcification. Morphologic changes in keeping with pulmonary arterial hypertension. Moderate sigmoid diverticulosis. Acute to subacute superior endplate fracture of P10 with 10-20% loss of height. No retropulsion. This could be confirmed with MRI examination. Aortic Atherosclerosis (ICD10-I70.0). Electronically Signed   By: Fidela Salisbury M.D.   On: 11/06/2021 01:08    Labs:  CBC: Recent  Labs    10/27/21 0110 10/28/21 0132 11/05/21 1916 11/07/21 0439  WBC 10.4 9.9 13.5* 12.8*  HGB 14.9 14.2 15.1* 13.6  HCT 48.7* 46.2* 48.9* 44.5  PLT 230 237 209 193    COAGS: No results for input(s): INR, APTT in the last 8760 hours.  BMP: Recent Labs    10/28/21 0132 10/29/21 0128 11/05/21 1916 11/07/21 0439  NA 135 139 139 143  K 3.7 3.6 4.0 4.6  CL 96* 98 96* 102  CO2 27 30 31  34*  GLUCOSE 120* 148* 118* 113*  BUN 18 19 20 18   CALCIUM 9.1 9.2 9.9 9.2  CREATININE 1.81* 1.66* 1.48* 1.23*  GFRNONAA 30* 33* 38* 47*    LIVER FUNCTION TESTS: Recent Labs    03/20/21 1119 06/04/21 1342 11/05/21 1916  BILITOT 0.5 0.6 0.8  AST 14 17 62*  ALT 11 14 59*  ALKPHOS 85 88 70  PROT 6.6 7.3 6.8  ALBUMIN 3.9 3.9 3.4*    TUMOR MARKERS: No results for input(s): AFPTM, CEA, CA199, CHROMGRNA in the last 8760 hours.  Assessment and Plan: History of AKI, acute heart failure, CHF, CKD stage III, neoplasm of left breast with radiation, HLD, PVD and prediabetes.  Patient presented to ED complaining of back pain and shortness of breath.  Work-up showed multiple metastases throughout spine and pelvis as well as pathologic fracture of T12 with 30% height loss and suspected small volume ventral epidural tumor.  CT chest abdomen pelvis 11/06/2021 demonstrates obstructing mass of the left node suspicious for pulmonary malignancy with collapse of left lower lobe, widespread hepatic metastases and bilateral adrenal metastases.   Patient was referred to IR by Dr. Meade Maw, neurosurgery, for kyphoplasty and biopsy at T12. Delight Hoh, MD is also requesting liver lesion biopsy for genetic receptor testing.  Procedure was approved by Dr. Denna Haggard. Procedure is tentatively scheduled the week of 11/11/21 pending insurance authorization.   MR thoracic spine 11/06/2021: IMPRESSION: 1. Widespread osseous metastases throughout the spine and visualized pelvis. 2. Pathologic T12 compression fracture with 30% height loss and suspected small volume ventral epidural tumor. No significant spinal stenosis. 3. Diffuse thoracic disc and facet degeneration as above.  Pt resting in bed with family at bedside.  She is A&O, calm and pleasant.  She is in no distress.  Pt is aware that she will be NPO after the day prior to procedure.  D/t pt receiving 81 mg ASA today, procedure delayed until next week.  Kyphoplasty pending insurance authorization as well.   Risks and benefits of kyphoplasty including biopsy and ablation of T12 vertebral body using Osteocool under moderate sedation were discussed with the patient including, but not limited to education regarding the natural healing process of compression fractures without intervention, bleeding, infection, cement migration which may cause spinal cord damage, paralysis, pulmonary embolism or even death.  This interventional procedure involves the use of X-rays and because of the nature of the planned procedure, it is possible that we will have prolonged use of X-ray fluoroscopy.  Potential radiation risks to you include (but are not limited to) the following: - A slightly elevated risk for cancer  several years later in life. This risk is typically less than 0.5% percent. This risk is low in comparison to the normal incidence of human cancer, which is 33% for women and 50% for men according to the [employer]. - Radiation induced injury can include skin redness,  resembling a rash, tissue breakdown / ulcers and hair loss (  which can be temporary or permanent).   The likelihood of either of these occurring depends on the difficulty of the procedure and whether you are sensitive to radiation due to previous procedures, disease, or genetic conditions.   IF your procedure requires a prolonged use of radiation, you will be notified and given written instructions for further action.  It is your responsibility to monitor the irradiated area for the 2 weeks following the procedure and to notify your physician if you are concerned that you have suffered a radiation induced injury.   Risks and benefits of random liver biopsy was discussed with the patient and/or patient's family including, but not limited to bleeding, infection, damage to adjacent structures or low yield requiring additional tests.  All of the patient's/patient's family's questions were answered and there is agreement to proceed.  Consent signed and in chart.     Thank you for this interesting consult.  I greatly enjoyed meeting Traci Gafford and look forward to participating in their care.  A copy of this report was sent to the requesting provider on this date.  Electronically Signed: Tyson Alias, NP 11/07/2021, 2:27 PM   I spent a total of 20 minutes in face to face in clinical consultation, greater than 50% of which was counseling/coordinating care for pathologic T12 fracture and liver biopsy for genetic receptor testing.

## 2021-11-07 NOTE — Progress Notes (Signed)
[facility]  [phone] [phone]  ID: Daisy [name] OB: 10-08-1950  MR#: [id number]  FBP#:[id number]  Patient Care Team: Lesleigh Noe, MD as PCP - General (Family Medicine) Magrinat, Virgie Dad, MD (Inactive) as Consulting Physician (Oncology) Adrian Prows, MD as Consulting Physician (Cardiology) Rigoberto Noel, MD as Consulting Physician (Pulmonary Disease) Lavonia Dana, MD as Consulting Physician (Nephrology)  CHIEF COMPLAINT: Lung mass with innumerable liver lesions as well as vertebral lesions highly suspicious for underlying malignancy.  INTERVAL HISTORY: Patient's pain is better controlled today, although she continues to have profound weakness and fatigue.  Patient offers no further specific complaints today.  REVIEW OF SYSTEMS:   Review of Systems  Constitutional:  Positive for malaise/fatigue. Negative for fever and weight loss.  Respiratory:  Positive for shortness of breath. Negative for cough and hemoptysis.   Cardiovascular: Negative.  Negative for chest pain and leg swelling.  Gastrointestinal: Negative.  Negative for abdominal pain.  Genitourinary: Negative.  Negative for dysuria.  Musculoskeletal: Negative.  Negative for back pain.  Skin: Negative.  Negative for rash.  Neurological:  Positive for weakness. Negative for focal weakness and headaches.  Psychiatric/Behavioral: Negative.  The patient is not nervous/anxious.    As per HPI. Otherwise, a complete review of systems is negative.  PAST MEDICAL HISTORY: Past Medical History:  Diagnosis Date   Acid reflux    Acute heart failure ([place]) 10/17/2019   AKI (acute kidney injury) ([place]) 10/17/2019   Arthritis    Breast cancer of upper-outer quadrant of right female breast ([place]) 11/19/2016   Chicken pox    Cholecystitis    Chronic diastolic heart failure ([place]) 01/04/2020   CKD (chronic kidney disease), stage III ([place]) 01/04/2020   Depression    Dyspnea    History of  radiation therapy 01/20/17-03/02/17   left breast was treated to 50.4 Gy in 18 fractions   HLD (hyperlipidemia) 01/04/2020   Hyperlipemia    Irregular heart rate    Malignant neoplasm of upper-outer quadrant of left breast in female, estrogen receptor positive ([place]) 09/30/2016   Morbid obesity with BMI of 50.0-59.9, adult ([place]) 01/04/2020   Obesity    Peripheral vascular disease ([place])    Pneumonia    Prediabetes 01/04/2020   Urinary tract infection    Urinary tract infection with hematuria     PAST SURGICAL HISTORY: Past Surgical History:  Procedure Laterality Date   ABDOMINAL HYSTERECTOMY     BREAST LUMPECTOMY Left 11/19/2016   BREAST LUMPECTOMY WITH RADIOACTIVE SEED AND SENTINEL LYMPH NODE BIOPSY (Left)   BREAST LUMPECTOMY WITH RADIOACTIVE SEED AND SENTINEL LYMPH NODE BIOPSY Left 11/19/2016   Procedure: BREAST LUMPECTOMY WITH RADIOACTIVE SEED AND SENTINEL LYMPH NODE BIOPSY;  Surgeon: Rolm Bookbinder, MD;  Location: [facility];  Service: General;  Laterality: Left;   CESAREAN SECTION     CESAREAN SECTION     x 2   CHOLECYSTECTOMY  2007   KNEE ARTHROSCOPY     bilateral   KNEE SURGERY Bilateral    LOWER EXTREMITY ANGIOGRAM N/A 04/04/2014   Procedure: LOWER EXTREMITY ANGIOGRAM;  Surgeon: Laverda Page, MD;  Location: [facility] CATH LAB;  Service: Cardiovascular;  Laterality: N/A;   OVARIAN CYST REMOVAL     RE-EXCISION OF BREAST LUMPECTOMY Left 12/12/2016   Procedure: RE-EXCISION OF LEFT BREAST LUMPECTOMY;  Surgeon: Rolm Bookbinder, MD;  Location: [facility];  Service: General;  Laterality: Left;   ROTATOR CUFF REPAIR Right    TONSILLECTOMY     WRIST  SURGERY Bilateral     FAMILY HISTORY: Family History  Problem Relation Age of Onset   Heart disease Mother    Lung cancer Father    Heart disease Father    Heart disease Brother     ADVANCED DIRECTIVES (Y/N):  @ADVDIR @  HEALTH MAINTENANCE: Social History   Tobacco Use   Smoking status: Some Days    Packs/day: 1.00    Years: 30.00     Pack years: 30.00    Types: E-cigarettes, Cigarettes    Last attempt to quit: 11/02/2019    Years since quitting: 2.0   Smokeless tobacco: Never  Vaping Use   Vaping Use: Never used  Substance Use Topics   Alcohol use: No   Drug use: No     Colonoscopy:  PAP:  Bone density:  Lipid panel:  Allergies  Allergen Reactions   Elemental Sulfur Hives   Levaquin [Levofloxacin] Other (See Comments)    Body aches   Statins     Current Facility-Administered Medications  Medication Dose Route Frequency Provider Last Rate Last Admin   0.9 %  sodium chloride infusion  250 mL Intravenous PRN Agbata, Tochukwu, MD       acetaminophen (TYLENOL) tablet 650 mg  650 mg Oral Q4H PRN Agbata, Tochukwu, MD       cholecalciferol (VITAMIN D3) tablet 1,000 Units  1,000 Units Oral Daily Agbata, Tochukwu, MD   1,000 Units at 11/07/21 0920   cyclobenzaprine (FLEXERIL) tablet 5 mg  5 mg Oral TID PRN Agbata, Tochukwu, MD       enoxaparin (LOVENOX) injection 70 mg  70 mg Subcutaneous Q24H Wieting, Richard, MD       ezetimibe (ZETIA) tablet 10 mg  10 mg Oral QHS Wieting, Richard, MD       FLUoxetine (PROZAC) capsule 20 mg  20 mg Oral Daily Agbata, Tochukwu, MD   20 mg at 11/07/21 0920   HYDROcodone-acetaminophen (NORCO/VICODIN) 5-325 MG per tablet 1 tablet  1 tablet Oral Q4H PRN Agbata, Tochukwu, MD   1 tablet at 11/07/21 1513   ipratropium-albuterol (DUONEB) 0.5-2.5 (3) MG/3ML nebulizer solution 3 mL  3 mL Nebulization Q6H PRN Agbata, Tochukwu, MD       LORazepam (ATIVAN) tablet 0.5 mg  0.5 mg Oral Q4H PRN Merlyn Lot, MD   0.5 mg at 11/06/21 1342   metoprolol succinate (TOPROL-XL) 24 hr tablet 12.5 mg  12.5 mg Oral Daily Agbata, Tochukwu, MD   12.5 mg at 11/07/21 0920   metroNIDAZOLE (FLAGYL) IVPB 500 mg  500 mg Intravenous Q12H Aleskerov, Luvenia Heller, MD 100 mL/hr at 11/07/21 0925 500 mg at 11/07/21 0925   ondansetron (ZOFRAN) tablet 4 mg  4 mg Oral Q6H PRN Agbata, Tochukwu, MD       Or   ondansetron (ZOFRAN)  injection 4 mg  4 mg Intravenous Q6H PRN Agbata, Tochukwu, MD       pantoprazole (PROTONIX) EC tablet 20 mg  20 mg Oral Daily Agbata, Tochukwu, MD   20 mg at 11/07/21 0921   potassium chloride SA (KLOR-CON M) CR tablet 20 mEq  20 mEq Oral Daily Agbata, Tochukwu, MD   20 mEq at 11/07/21 0920   sodium chloride flush (NS) 0.9 % injection 3 mL  3 mL Intravenous Q12H Agbata, Tochukwu, MD   3 mL at 11/07/21 0927   sodium chloride flush (NS) 0.9 % injection 3 mL  3 mL Intravenous PRN Agbata, Tochukwu, MD       torsemide (DEMADEX) tablet 20 mg  20 mg Oral Daily Agbata, Tochukwu, MD   20 mg at 11/07/21 0920   vitamin B-12 (CYANOCOBALAMIN) tablet 1,000 mcg  1,000 mcg Oral Daily Agbata, Tochukwu, MD   1,000 mcg at 11/07/21 0920    OBJECTIVE: Vitals:   11/07/21 0757 11/07/21 1542  BP: (!) 148/62 (!) 112/51  Pulse: 91 89  Resp: 18 17  Temp: 97.6 F (36.4 C) 98.7 F (37.1 C)  SpO2: 94% 90%     Body mass index is 49.96 kg/m.    ECOG FS:3 - Symptomatic, >50% confined to bed  General: Well-developed, well-nourished, no acute distress. Eyes: Pink conjunctiva, anicteric sclera. HEENT: Normocephalic, moist mucous membranes. Lungs: No audible wheezing or coughing. Heart: Regular rate and rhythm. Abdomen: Soft, nontender, no obvious distention. Musculoskeletal: No edema, cyanosis, or clubbing. Neuro: Alert, answering all questions appropriately. Cranial nerves grossly intact. Skin: No rashes or petechiae noted. Psych: Normal affect. Lymphatics: No cervical, calvicular, axillary or inguinal LAD.   LAB RESULTS:  Lab Results  Component Value Date   NA 143 11/07/2021   K 4.6 11/07/2021   CL 102 11/07/2021   CO2 34 (H) 11/07/2021   GLUCOSE 113 (H) 11/07/2021   BUN 18 11/07/2021   CREATININE 1.23 (H) 11/07/2021   CALCIUM 9.2 11/07/2021   PROT 6.8 11/05/2021   ALBUMIN 3.4 (L) 11/05/2021   AST 62 (H) 11/05/2021   ALT 59 (H) 11/05/2021   ALKPHOS 70 11/05/2021   BILITOT 0.8 11/05/2021   GFRNONAA  47 (L) 11/07/2021   GFRAA 42 (L) 02/14/2020    Lab Results  Component Value Date   WBC 12.8 (H) 11/07/2021   NEUTROABS 5.2 06/04/2021   HGB 13.6 11/07/2021   HCT 44.5 11/07/2021   MCV 101.6 (H) 11/07/2021   PLT 193 11/07/2021     STUDIES: DG Chest 2 View  Result Date: 11/05/2021 CLINICAL DATA:  Productive cough, wheezing EXAM: CHEST - 2 VIEW COMPARISON:  10/25/2021 FINDINGS: Left basilar opacity persists, likely representing a lungs are otherwise clear. No pneumothorax. Cardiac size is mildly enlarged. Pulmonary vascularity is normal. No acute bone abnormality. Moderate left pleural effusion with associated left basilar atelectasis or infiltrate. IMPRESSION: Stable moderate left pleural effusion with associated left basilar atelectasis or infiltrate. Given its persistence, CT imaging with contrast would be helpful to exclude the presence of a chronic infiltrate within the left lung base. Electronically Signed   By: Fidela Salisbury M.D.   On: 11/05/2021 19:51   DG Chest 2 View  Result Date: 10/25/2021 CLINICAL DATA:  Shortness of breath, hypoxia, chest congestion EXAM: CHEST - 2 VIEW COMPARISON:  10/24/2019 FINDINGS: Enlargement of cardiac silhouette with pulmonary vascular congestion. Mediastinal contours normal with atherosclerotic calcifications aorta. LEFT pleural effusion and basilar atelectasis versus consolidation. Remaining lungs clear. No pneumothorax or acute osseous findings. IMPRESSION: Enlargement of cardiac silhouette with pulmonary vascular congestion. LEFT basilar opacity in consistent with LEFT pleural effusion and associated atelectasis versus consolidation LEFT lower lobe. Aortic Atherosclerosis (ICD10-I70.0). Electronically Signed   By: Lavonia Dana M.D.   On: 10/25/2021 15:00   DG Lumbar Spine 2-3 Views  Result Date: 10/26/2021 CLINICAL DATA:  Low back pain. EXAM: LUMBAR SPINE - 2-3 VIEW COMPARISON:  12/26/2008. FINDINGS: There is no evidence of acute lumbar spine  fracture. Alignment is normal. Multilevel intervertebral disc space narrowing, degenerative endplate osteophyte formation, and facet arthropathy is noted. There is atherosclerotic calcification of the abdominal aorta. IMPRESSION: Moderate degenerative changes in the lumbar spine. No evidence of acute fracture. Electronically Signed  By: Brett Fairy M.D.   On: 10/26/2021 21:42   MR THORACIC SPINE W WO CONTRAST  Result Date: 11/06/2021 CLINICAL DATA:  Bone lesion, thoracic spine.  Compression fracture. EXAM: MRI THORACIC WITHOUT AND WITH CONTRAST TECHNIQUE: Multiplanar and multiecho pulse sequences of the thoracic spine were obtained without and with intravenous contrast. CONTRAST:  42mL GADAVIST GADOBUTROL 1 MMOL/ML IV SOLN COMPARISON:  CT chest, abdomen, and pelvis 11/06/2021 FINDINGS: Alignment:  Trace anterolisthesis of T4 on T5. Vertebrae: Widespread T1 hypointense, enhancing lesions at every level in the thoracic spine involving the vertebral bodies and posterior elements. Pathologic T12 compression fracture with 30% vertebral body height loss. Mild bulging of the posterior cortex of the T12 vertebral body with suspected small volume ventral epidural tumor not resulting in significant spinal stenosis or spinal cord mass effect. Widespread bone lesions throughout the visualized portions of the cervical spine, lumbar spine, and pelvis. Cord:  Normal cord signal.  No abnormal intradural enhancement. Paraspinal and other soft tissues: Obstructing left lower lobe pulmonary mass, mediastinal lymphadenopathy, and bilateral adrenal masses as shown on today's earlier CT. Asymmetric left renal atrophy. Disc levels: Diffuse thoracic disc and facet degeneration. Central/paracentral disc protrusions from T5-6 to T10-11, largest at T5-6 and T8-9 where there is mild ventral cord flattening without significant generalized spinal stenosis. IMPRESSION: 1. Widespread osseous metastases throughout the spine and visualized  pelvis. 2. Pathologic T12 compression fracture with 30% height loss and suspected small volume ventral epidural tumor. No significant spinal stenosis. 3. Diffuse thoracic disc and facet degeneration as above. Electronically Signed   By: Logan Bores M.D.   On: 11/06/2021 15:06   CT CHEST ABDOMEN PELVIS W CONTRAST  Result Date: 11/06/2021 CLINICAL DATA:  Sepsis, diarrhea EXAM: CT CHEST, ABDOMEN, AND PELVIS WITH CONTRAST TECHNIQUE: Multidetector CT imaging of the chest, abdomen and pelvis was performed following the standard protocol during bolus administration of intravenous contrast. RADIATION DOSE REDUCTION: This exam was performed according to the departmental dose-optimization program which includes automated exposure control, adjustment of the mA and/or kV according to patient size and/or use of iterative reconstruction technique. CONTRAST:  170mL OMNIPAQUE IOHEXOL 300 MG/ML  SOLN COMPARISON:  None Available. FINDINGS: CT CHEST FINDINGS Cardiovascular: Moderate coronary artery calcification. Left anterior descending coronary artery stenting has been performed. Global cardiac size within normal limits. No pericardial effusion. Central pulmonary arteries are enlarged in keeping with changes of pulmonary arterial hypertension. Moderate atherosclerotic calcification within the thoracic aorta. No aortic aneurysm. Mediastinum/Nodes: There is pathologic mediastinal adenopathy within the prevascular, right paratracheal, subcarinal, and aortopulmonary lymph node groups. Index lymph node measures 1.8 x 2.7 cm at axial image # 21/2 within the right paratracheal lymph node group. Visualized thyroid is unremarkable. Esophagus unremarkable. Lungs/Pleura: There is a a central pulmonary mass within the left lower lobe which is poorly delineated due to the adjacent collapsed lung, but results in obliteration of the left lower lobar pulmonary bronchus and narrowing of the left inferior pulmonary vein. This measures roughly  2.7 x 3.8 cm at axial image # 37/2. There is a suspected pleural metastasis within the left cardiophrenic angle seen at axial image # 43/2 and inferiorly at axial image # 49/2. Small left pleural effusion is present. Nodular pleural thickening anteriorly within the left costophrenic sulcus anteriorly. Left upper lobe and right lung are clear. No pneumothorax. No pleural effusion on the right. Musculoskeletal: No acute bone abnormality. No lytic or blastic bone lesion. Surgical clips are seen within the left breast. CT ABDOMEN PELVIS  FINDINGS Hepatobiliary: Innumerable hypoenhancing masses are seen throughout the liver in keeping with widespread hepatic metastases. Mild resultant hepatomegaly. Cholecystectomy has been performed. No intra or extrahepatic biliary ductal dilation. Pancreas: Unremarkable Spleen: Unremarkable Adrenals/Urinary Tract: Bilateral adrenal masses are identified in keeping with bilateral adrenal metastases. The kidneys are normal in position. Moderate right and moderate to severe left renal cortical atrophy. No hydronephrosis. No intrarenal or ureteral calculi. No enhancing intrarenal masses. The bladder is unremarkable. Stomach/Bowel: Moderate sigmoid diverticulosis. The stomach, large bowel, and small bowel are otherwise unremarkable. Appendix normal. No free intraperitoneal gas or fluid. Vascular/Lymphatic: There is extensive aortoiliac atherosclerotic calcification. No aortic aneurysm. Pathologic periportal lymph node is seen measuring 18 mm in short axis diameter at axial image # 60/2. Reproductive: Status post hysterectomy. No adnexal masses. Other: No abdominal wall hernia. Musculoskeletal: No lytic or blastic bone lesion. There is a a acute to subacute superior endplate fracture of U63 with approximately 10-20% loss of height. No retropulsion. IMPRESSION: Central obstructing mass within the left lower lobe, poorly delineated, most in keeping with a primary pulmonary malignancy with  resultant obliteration of the left lower lobar pulmonary bronchus and narrowing of the left inferior pulmonary vein. Resultant collapse of the left lower lobe. 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 PET CT examination would be helpful to better delineate the primary mass and confirm the presence of pleural metastatic disease. Hepatic metastases should be easily amenable to ultrasound-guided biopsy for further evaluation. Moderate coronary artery calcification. Morphologic changes in keeping with pulmonary arterial hypertension. Moderate sigmoid diverticulosis. Acute to subacute superior endplate fracture of F35 with 10-20% loss of height. No retropulsion. This could be confirmed with MRI examination. Aortic Atherosclerosis (ICD10-I70.0). Electronically Signed   By: Fidela Salisbury M.D.   On: 11/06/2021 01:08    ASSESSMENT: Lung mass with innumerable liver lesions as well as vertebral lesions highly suspicious for underlying malignancy.  PLAN:    Lung mass with innumerable liver lesions and vertebral lesions: CT scan results from Nov 06, 2021 reviewed independently and reported as above with central obstructing mass within the left lower lobe of the lung measuring at least 2.7 x 3.8 cm, innumerable hepatic lesions, and T12 bony lesion highly suspicious for underlying malignancy.  Plan is to do biopsy and kyphoplasty of T12 lesion as well as liver biopsy at the same time.  Unfortunately, patient is taking aspirin and biopsy cannot be completed for 3 to 5 days.  This may be accomplished as an outpatient.  Although possible, this is low suspicion for metastatic breast cancer despite her recent history.  Patient will also require PET scan to complete staging work-up upon discharge.  CT scan of the head did not reveal any metastatic disease. History of breast cancer:  ER/PR positive, stage I.  Patient had low risk Oncotype therefore did not require chemotherapy.  She completed 5 years of anastrozole approximately 1 month ago. Suspect this is a second primary and unrelated to her history of breast cancer.  CA 27-29 is pending at time of dictation. Pain.  Improved.  Possible kyphoplasty as above.  Continue IV morphine as ordered. Decreased performance status: Patient will require physical therapy evaluation and possible home physical therapy upon discharge.  Will follow.  Lloyd Huger, MD   11/07/2021 5:12 PM

## 2021-11-07 NOTE — Consult Note (Signed)
PULMONOLOGY         Date: 11/07/2021,   MRN# [id number] Daisy [name] 16-Nov-1950     AdmissionWeight: (!) 144.7 kg                 CurrentWeight: (!) 144.7 kg  Referring provider: Dr. Francine Graven   CHIEF COMPLAINT:   Left lower lobe consolidated infiltrate with postobstructive mass   HISTORY OF PRESENT ILLNESS   This is a pleasant 71 year old female with a history of gastroesophageal reflux, CHF, AKI, osteoarthritis, previous breast cancer, cholecystitis, major depression disorder, chronic dyspnea, dyslipidemia, atrial fibrillation, morbid obesity, prediabetes, recurrent UTIs, who came into the hospital with complaints of abdominal pain and diarrhea.  Notably she was recently discharged with CHF exacerbation and continues to have chronic dyspnea.  She also complains of worsening lower back pain.  She had imaging of her spine which showed T12 compression fracture that appears to be acute.  There was an incidental finding of lung mass.  Additional imaging was performed and I reviewed CT scans independently with findings of left lower lobe consolidated infiltrate with mass as well as multiple liver lesions suggestive of distal metastasis.  She has had neurosurgical evaluation with possible kyphoplasty in future.  She had medical oncology evaluation and there is discussion about liver biopsy.  Pulmonary consultation for evaluation management of left lung pneumonia with mass.   PAST MEDICAL HISTORY   Past Medical History:  Diagnosis Date   Acid reflux    Acute heart failure ([place]) 10/17/2019   AKI (acute kidney injury) ([place]) 10/17/2019   Arthritis    Breast cancer of upper-outer quadrant of right female breast ([place]) 11/19/2016   Chicken pox    Cholecystitis    Chronic diastolic heart failure ([place]) 01/04/2020   CKD (chronic kidney disease), stage III ([place]) 01/04/2020   Depression    Dyspnea    History of radiation therapy 01/20/17-03/02/17   left breast was treated to 50.4 Gy in 18  fractions   HLD (hyperlipidemia) 01/04/2020   Hyperlipemia    Irregular heart rate    Malignant neoplasm of upper-outer quadrant of left breast in female, estrogen receptor positive ([place]) 09/30/2016   Morbid obesity with BMI of 50.0-59.9, adult ([place]) 01/04/2020   Obesity    Peripheral vascular disease ([place])    Pneumonia    Prediabetes 01/04/2020   Urinary tract infection    Urinary tract infection with hematuria      SURGICAL HISTORY   Past Surgical History:  Procedure Laterality Date   ABDOMINAL HYSTERECTOMY     BREAST LUMPECTOMY Left 11/19/2016   BREAST LUMPECTOMY WITH RADIOACTIVE SEED AND SENTINEL LYMPH NODE BIOPSY (Left)   BREAST LUMPECTOMY WITH RADIOACTIVE SEED AND SENTINEL LYMPH NODE BIOPSY Left 11/19/2016   Procedure: BREAST LUMPECTOMY WITH RADIOACTIVE SEED AND SENTINEL LYMPH NODE BIOPSY;  Surgeon: Rolm Bookbinder, MD;  Location: [facility];  Service: General;  Laterality: Left;   CESAREAN SECTION     CESAREAN SECTION     x 2   CHOLECYSTECTOMY  2007   KNEE ARTHROSCOPY     bilateral   KNEE SURGERY Bilateral    LOWER EXTREMITY ANGIOGRAM N/A 04/04/2014   Procedure: LOWER EXTREMITY ANGIOGRAM;  Surgeon: Laverda Page, MD;  Location: [facility] CATH LAB;  Service: Cardiovascular;  Laterality: N/A;   OVARIAN CYST REMOVAL     RE-EXCISION OF BREAST LUMPECTOMY Left 12/12/2016   Procedure: RE-EXCISION OF LEFT BREAST LUMPECTOMY;  Surgeon: Rolm Bookbinder, MD;  Location: [facility];  Service: General;  Laterality: Left;   ROTATOR CUFF REPAIR Right    TONSILLECTOMY     WRIST SURGERY Bilateral      FAMILY HISTORY   Family History  Problem Relation Age of Onset   Heart disease Mother    Lung cancer Father    Heart disease Father    Heart disease Brother      SOCIAL HISTORY   Social History   Tobacco Use   Smoking status: Some Days    Packs/day: 1.00    Years: 30.00    Pack years: 30.00    Types: E-cigarettes, Cigarettes    Last attempt to quit: 11/02/2019    Years since  quitting: 2.0   Smokeless tobacco: Never  Vaping Use   Vaping Use: Never used  Substance Use Topics   Alcohol use: No   Drug use: No     MEDICATIONS    Home Medication:    Current Medication:  Current Facility-Administered Medications:    0.9 %  sodium chloride infusion, , Intravenous, Continuous, Ward, Kristen N, DO, Last Rate: 125 mL/hr at 11/07/21 0446, New Bag at 11/07/21 0446   0.9 %  sodium chloride infusion, 250 mL, Intravenous, PRN, Agbata, Tochukwu, MD   acetaminophen (TYLENOL) tablet 650 mg, 650 mg, Oral, Q4H PRN, Agbata, Tochukwu, MD   aspirin chewable tablet 81 mg, 81 mg, Oral, Daily, Agbata, Tochukwu, MD, 81 mg at 11/06/21 1115   Bempedoic Acid-Ezetimibe 180-10 MG TABS 1 tablet, 1 tablet, Oral, QHS, Agbata, Tochukwu, MD   cholecalciferol (VITAMIN D3) tablet 1,000 Units, 1,000 Units, Oral, Daily, Agbata, Tochukwu, MD, 1,000 Units at 11/06/21 1115   cilostazol (PLETAL) tablet 50 mg, 50 mg, Oral, BID, Agbata, Tochukwu, MD, 50 mg at 11/06/21 2044   cyclobenzaprine (FLEXERIL) tablet 5 mg, 5 mg, Oral, TID PRN, Agbata, Tochukwu, MD   enoxaparin (LOVENOX) injection 70 mg, 70 mg, Subcutaneous, Q24H, Agbata, Tochukwu, MD, 70 mg at 11/06/21 1111   FLUoxetine (PROZAC) capsule 20 mg, 20 mg, Oral, Daily, Agbata, Tochukwu, MD, 20 mg at 11/06/21 1115   HYDROcodone-acetaminophen (NORCO/VICODIN) 5-325 MG per tablet 1 tablet, 1 tablet, Oral, Q4H PRN, Agbata, Tochukwu, MD, 1 tablet at 11/07/21 0446   ipratropium-albuterol (DUONEB) 0.5-2.5 (3) MG/3ML nebulizer solution 3 mL, 3 mL, Nebulization, Q6H PRN, Agbata, Tochukwu, MD   LORazepam (ATIVAN) tablet 0.5 mg, 0.5 mg, Oral, Q4H PRN, Merlyn Lot, MD, 0.5 mg at 11/06/21 1342   metoprolol succinate (TOPROL-XL) 24 hr tablet 12.5 mg, 12.5 mg, Oral, Daily, Agbata, Tochukwu, MD, 12.5 mg at 11/06/21 1110   ondansetron (ZOFRAN) tablet 4 mg, 4 mg, Oral, Q6H PRN **OR** ondansetron (ZOFRAN) injection 4 mg, 4 mg, Intravenous, Q6H PRN, Agbata,  Tochukwu, MD   pantoprazole (PROTONIX) EC tablet 20 mg, 20 mg, Oral, Daily, Agbata, Tochukwu, MD, 20 mg at 11/06/21 1110   potassium chloride SA (KLOR-CON M) CR tablet 20 mEq, 20 mEq, Oral, Daily, Agbata, Tochukwu, MD, 20 mEq at 11/06/21 1115   sodium chloride flush (NS) 0.9 % injection 3 mL, 3 mL, Intravenous, Q12H, Agbata, Tochukwu, MD   sodium chloride flush (NS) 0.9 % injection 3 mL, 3 mL, Intravenous, PRN, Agbata, Tochukwu, MD   torsemide (DEMADEX) tablet 20 mg, 20 mg, Oral, Daily, Agbata, Tochukwu, MD, 20 mg at 11/06/21 1115   vitamin B-12 (CYANOCOBALAMIN) tablet 1,000 mcg, 1,000 mcg, Oral, Daily, Agbata, Tochukwu, MD, 1,000 mcg at 11/06/21 1110    ALLERGIES   Elemental sulfur, Levaquin [levofloxacin], and Statins     REVIEW OF  SYSTEMS    Review of Systems:  Gen:  Denies  fever, sweats, chills weigh loss  HEENT: Denies blurred vision, double vision, ear pain, eye pain, hearing loss, nose bleeds, sore throat Cardiac:  No dizziness, chest pain or heaviness, chest tightness,edema Resp:   reports dyspnea chronically  Gi: Denies swallowing difficulty, stomach pain, nausea or vomiting, diarrhea, constipation, bowel incontinence Gu:  Denies bladder incontinence, burning urine Ext:   Denies Joint pain, stiffness or swelling Skin: Denies  skin rash, easy bruising or bleeding or hives Endoc:  Denies polyuria, polydipsia , polyphagia or weight change Psych:   Denies depression, insomnia or hallucinations   Other:  All other systems negative   VS: BP (!) 148/62 (BP Location: Right Arm)   Pulse 91   Temp 97.6 F (36.4 C)   Resp 18   Ht 5\' 7"  (1.702 m)   Wt (!) 144.7 kg   SpO2 94%   BMI 49.96 kg/m      PHYSICAL EXAM    GENERAL:NAD, no fevers, chills, no weakness no fatigue HEAD: Normocephalic, atraumatic.  EYES: Pupils equal, round, reactive to light. Extraocular muscles intact. No scleral icterus.  MOUTH: Moist mucosal membrane. Dentition intact. No abscess noted.   EAR, NOSE, THROAT: Clear without exudates. No external lesions.  NECK: Supple. No thyromegaly. No nodules. No JVD.  PULMONARY: decreased breath sounds with mild rhonchi worse at bases bilaterally.  CARDIOVASCULAR: S1 and S2. Regular rate and rhythm. No murmurs, rubs, or gallops. No edema. Pedal pulses 2+ bilaterally.  GASTROINTESTINAL: Soft, nontender, nondistended. No masses. Positive bowel sounds. No hepatosplenomegaly.  MUSCULOSKELETAL: No swelling, clubbing, or edema. Range of motion full in all extremities.  NEUROLOGIC: Cranial nerves II through XII are intact. No gross focal neurological deficits. Sensation intact. Reflexes intact.  SKIN: No ulceration, lesions, rashes, or cyanosis. Skin warm and dry. Turgor intact.  PSYCHIATRIC: Mood, affect within normal limits. The patient is awake, alert and oriented x 3. Insight, judgment intact.       IMAGING   Reviewed CT chest findings of left lower lobe mass and surrounding pneumonia  ASSESSMENT/PLAN   Left lung consolidated infiltrate with lung mass   -Patient with at least moderate pretest probability of malignancy due to previous breast cancer as well as history of COPD and chronic hypoxemia.   -Case discussed with medical oncologist and plan for now is to obtain tissue via liver biopsy -We will be available for bronchoscopic evaluation and lung biopsy if necessary in the future.  -Additional imaging via PET scan is planned on outpatient   Chronic hypoxemia with acute left lung consolidated infiltrate and postobstructive pneumonia    -I would favor discontinuing resuscitative fluids at 125 mL/h due to underlying CHF status post CHF exacerbation with hospitalization. -Patient is also on torsemide once daily which I think may be helpful due to clinical signs of edema with crackles at the bases bilaterally on auscultation -Patient has elevated WBC count possibly due to pneumonia although she has diarrhea and C. difficile and GI panel are  in process.  I would start Flagyl empirically for now as this may cover certain species causing pneumonia as well.  -Due to pneumonia coupled with diarrhea will obtain testing for Legionella via serology and urinary antigen -Procalcitonin trending May not be too useful in this case due to underlying CKD -MRSA PCR and respiratory viral panel    Innumerable liver lesions Patient is in process of being seen by oncology and there is plan for liver biopsy  -  suspect metastatic implants   Thank you for allowing me to participate in the care of this patient.   Patient/Family are satisfied with care plan and all questions have been answered.    Provider disclosure: Patient with at least one acute or chronic illness or injury that poses a threat to life or bodily function and is being managed actively during this encounter.  All of the below services have been performed independently by signing provider:  review of prior documentation from internal and or external health records.  Review of previous and current lab results.  Interview and comprehensive assessment during patient visit today. Review of current and previous chest radiographs/CT scans. Discussion of management and test interpretation with health care team and patient/family.   This document was prepared using Dragon voice recognition software and may include unintentional dictation errors.     Ottie Glazier, M.D.  Division of Pulmonary & Critical Care Medicine

## 2021-11-08 DIAGNOSIS — R918 Other nonspecific abnormal finding of lung field: Secondary | ICD-10-CM | POA: Diagnosis not present

## 2021-11-08 DIAGNOSIS — S22080K Wedge compression fracture of T11-T12 vertebra, subsequent encounter for fracture with nonunion: Secondary | ICD-10-CM | POA: Diagnosis not present

## 2021-11-08 DIAGNOSIS — C50412 Malignant neoplasm of upper-outer quadrant of left female breast: Secondary | ICD-10-CM | POA: Diagnosis not present

## 2021-11-08 LAB — GASTROINTESTINAL PANEL BY PCR, STOOL (REPLACES STOOL CULTURE)

## 2021-11-08 LAB — C DIFFICILE QUICK SCREEN W PCR REFLEX
C Diff antigen: NEGATIVE
C Diff interpretation: NOT DETECTED
C Diff toxin: NEGATIVE

## 2021-11-08 LAB — LEGIONELLA PNEUMOPHILA SEROGP 1 UR AG: L. pneumophila Serogp 1 Ur Ag: NEGATIVE

## 2021-11-08 LAB — RESPIRATORY PANEL BY PCR

## 2021-11-08 LAB — LEGIONELLA PNEUMOPHILA TOTAL AB: Legionella Pneumo Total Ab: <0.91 OD ratio (ref 0.00–0.90)

## 2021-11-08 LAB — CANCER ANTIGEN 27.29: CA 27.29: 18.3 U/mL (ref 0.0–38.6)

## 2021-11-08 MED ORDER — TORSEMIDE 20 MG PO TABS
20.0000 mg | ORAL_TABLET | Freq: Two times a day (BID) | ORAL | Status: DC
  Administered 2021-11-08 – 2021-11-10 (×6): 20 mg via ORAL
  Filled 2021-11-08 (×6): qty 1

## 2021-11-08 MED ORDER — POTASSIUM CHLORIDE CRYS ER 20 MEQ PO TBCR
20.0000 meq | EXTENDED_RELEASE_TABLET | Freq: Two times a day (BID) | ORAL | Status: DC
  Administered 2021-11-08 – 2021-11-19 (×22): 20 meq via ORAL
  Filled 2021-11-08 (×21): qty 1

## 2021-11-08 NOTE — Evaluation (Signed)
Occupational Therapy Evaluation Patient Details Name: Daisy [name] MRN: [id number] DOB: 1950/06/20 Today's Date: 11/08/2021   History of Present Illness Pt is a 71 y.o. female with medical history significant for morbid obesity (BMI 49.6 kg/m2) with complications of obstructive sleep apnea on BiPAP at night, chronic respiratory failure on as needed oxygen, PVD, dyslipidemia, stage III CKD, history of breast cancer status post lumpectomy and radiation therapy, chronic diastolic dysfunction CHF, COPD who was recently discharged from the hospital after treatment for acute on chronic hypoxic and hypercapnic respiratory failure secondary to CHF who presents to the emergency room for evaluation of abdominal pain mostly in the left lower quadrant associated with diarrhea.  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   Clinical Impression   Patient presenting with decreased Ind in self care,balance, functional mobility/transfers, endurance, and safety awareness. Patient's daughters present to confirm baseline. They are available to assist her as needed at discharge. Pt is a short distance ambulator with RW at baseline. She has assistance from daughter for self care tasks. Patient currently functioning at min guard - min A for functional transfers with use of RW. Pt stands at sink briefly with min guard for hand hygiene before requesting to sit. OT educated pt and family on precautions and provided total A to don TLSO. Family has obtained funds to build ramp at home. She is on 3Ls O2 at baseline. Patient will benefit from acute OT to increase overall independence in the areas of ADLs, functional mobility, and safety awareness in order to safely discharge home with family.      Recommendations for follow up therapy are one component of a multi-disciplinary discharge planning process, led by the attending physician.   Recommendations may be updated based on patient status, additional functional criteria and insurance authorization.   Follow Up Recommendations  Home health OT    Assistance Recommended at Discharge Frequent or constant Supervision/Assistance  Patient can return home with the following A little help with walking and/or transfers;A lot of help with bathing/dressing/bathroom;Assistance with cooking/housework;Assist for transportation;Help with stairs or ramp for entrance    Functional Status Assessment  Patient has had a recent decline in their functional status and demonstrates the ability to make significant improvements in function in a reasonable and predictable amount of time.  Equipment Recommendations  None recommended by OT       Precautions / Restrictions Precautions Precautions: Fall;Back Precaution Comments: watch O2 Required Braces or Orthoses: Spinal Brace Spinal Brace: Thoracolumbosacral orthotic Restrictions Weight Bearing Restrictions: No Other Position/Activity Restrictions: TLSO on when OOB except for when showering, confirmed with Dr. Izora Ribas 11/08/21 that brace may be off in bed      Mobility Bed Mobility               General bed mobility comments: Pt seated on EOB    Transfers Overall transfer level: Needs assistance Equipment used: Rolling walker (2 wheels) Transfers: Sit to/from Stand, Bed to chair/wheelchair/BSC Sit to Stand: Min guard, Min assist     Step pivot transfers: Min assist            Balance Overall balance assessment: Needs assistance Sitting-balance support: Feet supported, No upper extremity supported Sitting balance-Leahy Scale: Good     Standing balance support: Bilateral upper extremity supported, During functional activity, Reliant on assistive device for balance Standing balance-Leahy Scale: Fair Standing balance comment: reliant on UE support  ADL either performed or assessed  with clinical judgement   ADL Overall ADL's : Needs assistance/impaired                         Toilet Transfer: BSC/3in1;Rolling walker (2 wheels);Minimal assistance Toilet Transfer Details (indicate cue type and reason): simulated         Functional mobility during ADLs: Min guard;Rolling walker (2 wheels)       Vision Patient Visual Report: No change from baseline              Pertinent Vitals/Pain Pain Assessment Pain Assessment: 0-10 Pain Score: 7  Pain Location: back Pain Descriptors / Indicators: Sore, Aching Pain Intervention(s): Limited activity within patient's tolerance, Monitored during session, Repositioned     Hand Dominance Right   Extremity/Trunk Assessment Upper Extremity Assessment Upper Extremity Assessment: Generalized weakness   Lower Extremity Assessment Lower Extremity Assessment: Generalized weakness       Communication Communication Communication: No difficulties   Cognition Arousal/Alertness: Awake/alert Behavior During Therapy: WFL for tasks assessed/performed Overall Cognitive Status: Within Functional Limits for tasks assessed                                                  Home Living Family/patient expects to be discharged to:: Private residence Living Arrangements: Children Available Help at Discharge: Family;Available 24 hours/day Type of Home: House Home Access: Stairs to enter;Other (comment) (Family is planning on building a ramp) Entrance Stairs-Number of Steps: 5 Entrance Stairs-Rails: Right;Left;Can reach both Home Layout: One level     Bathroom Shower/Tub: Teacher, early years/pre: Standard     Home Equipment: Conservation officer, nature (2 wheels);Rollator (4 wheels);Tub bench;Hand held shower head;Adaptive equipment;BSC/3in1 Adaptive Equipment: Reacher        Prior Functioning/Environment Prior Level of Function : Needs assist             Mobility Comments: Mod Ind amb  with a RW household distances, uses facility w/c for MD apts, no fall history ADLs Comments: Daughter assists with bathing and dressing        OT Problem List: Impaired balance (sitting and/or standing);Decreased activity tolerance      OT Treatment/Interventions: Self-care/ADL training;Balance training;Therapeutic exercise;Therapeutic activities;Energy conservation;DME and/or AE instruction;Patient/family education    OT Goals(Current goals can be found in the care plan section) Acute Rehab OT Goals Patient Stated Goal: to go home and decrease pain OT Goal Formulation: With patient/family Time For Goal Achievement: 11/22/21 Potential to Achieve Goals: Fair ADL Goals Pt Will Perform Grooming: with modified independence;standing Pt Will Perform Lower Body Dressing: with min assist;sit to/from stand Pt Will Transfer to Toilet: ambulating;with supervision Pt Will Perform Toileting - Clothing Manipulation and hygiene: with min assist;sit to/from stand  OT Frequency: Min 2X/week       AM-PAC OT "6 Clicks" Daily Activity     Outcome Measure Help from another person eating meals?: None Help from another person taking care of personal grooming?: A Little Help from another person toileting, which includes using toliet, bedpan, or urinal?: A Little Help from another person bathing (including washing, rinsing, drying)?: A Lot Help from another person to put on and taking off regular upper body clothing?: A Little Help from another person to put on and taking off regular lower body clothing?: A Lot 6 Click Score:  17   End of Session Equipment Utilized During Treatment: Rolling walker (2 wheels);Oxygen (3Ls) Nurse Communication: Mobility status  Activity Tolerance: Patient tolerated treatment well Patient left: in chair;with call bell/phone within reach;with family/visitor present  OT Visit Diagnosis: Other abnormalities of gait and mobility (R26.89)                Time: 1165-7903 OT  Time Calculation (min): 25 min Charges:  OT General Charges $OT Visit: 1 Visit OT Evaluation $OT Eval Moderate Complexity: 1 Mod OT Treatments $Self Care/Home Management : 8-22 mins  [name], MS, OTR/L , CBIS ascom [phone]  11/08/21, 2:38 PM

## 2021-11-08 NOTE — Progress Notes (Signed)
Biochemist, clinical for KP received this afternoon.  Plan to proceed with T12 bone biopsy/KP/ablation as well as liver lesion biopsy Tuesday 5/30 with Dr. Laurence Ferrari. IR will call for patient when ready.  Orders placed for the following: - NPO at midnight on 5/30 - Hold Lovenox after 5/28 dose - CBC and INR AM of 5/30  ASA 81 mg has been held as of today, continue to hold until post procedure.   Please call IR with questions or concerns.  Candiss Norse, PA-C

## 2021-11-08 NOTE — Progress Notes (Signed)
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  Interventional radiology will do a kyphoplasty but will need insurance authorization first.  Likely this will be done next week.  Holding aspirin and Pletal for now.  Interventional radiology wanted aspirin held for 5 days prior to procedure.  Continue Lovenox until 24 hours before the procedure.  Mass of lower lobe of left lung Patient has a mass of the lung, liver and osseous metastases.  Patient does have a history of breast cancer.  The patient will end up getting a liver biopsy at the time of the kyphoplasty.  Malignant neoplasm of upper-outer quadrant of left breast in female, estrogen receptor positive (Daisy [name]) Patient with a known history of left breast cancer status post lumpectomy and radiation treatment. Patient completed a 5 year course of Anastrazole  Morbid obesity with BMI of 50.0-59.9, adult (Daisy [name]) Patient is morbidly obese with a BMI of 49.96.  Daily weights ordered.   Obesity hypoventilation syndrome (HCC) Chronic 2 L of oxygen and BiPAP at night  Generalized anxiety disorder Continue fluoxetine  CKD (chronic kidney disease), stage III (HCC) CKD stage IIIa  Essential hypertension Continue metoprolol  COPD with chronic bronchitis and emphysema (HCC) Stable and not acutely exacerbated Continue as needed bronchodilator therapy  Chronic diastolic heart failure (HCC) Stable and not acutely exacerbated Continue metoprolol and torsemide.  Family wanted to go up to twice a day on torsemide.  Weakness. Physical therapy will recommend home health.  The patient will need to be able to go up some steps to get into her house and down steps to get out of her house.  Family working on trying to set up somebody to build a  ramp.        Subjective: Patient does have a little back pain.  Came in with back pain and difficulty moving around.  Found to have compression fracture of T12 and lung lesion, liver lesions and osseous lesions on CT scan.  Physical Exam: Vitals:   11/07/21 2039 11/08/21 0453 11/08/21 0454 11/08/21 0803  BP: 116/71 (!) 137/98  (!) 151/90  Pulse: 92 94  93  Resp: 20 18  18   Temp: 98.4 F (36.9 C)  98.7 F (37.1 C) 98 F (36.7 C)  TempSrc: Oral     SpO2: 93% 93%  92%  Weight:      Height:       Physical Exam HENT:     Head: Normocephalic.     Mouth/Throat:     Pharynx: No oropharyngeal exudate.  Eyes:     General: Lids are normal.     Conjunctiva/sclera: Conjunctivae normal.  Cardiovascular:     Rate and Rhythm: Normal rate and regular rhythm.     Heart sounds: Normal heart sounds, S1 normal and S2 normal.  Pulmonary:     Breath sounds: No decreased breath sounds, wheezing, rhonchi or rales.  Abdominal:     Palpations: Abdomen is soft.     Tenderness: There is no abdominal tenderness.  Musculoskeletal:     Right lower leg: Swelling present.     Left lower leg: Swelling present.  Skin:    General: Skin is warm.     Findings: No rash.  Neurological:     Mental Status: She is alert and oriented to  person, place, and time.    Data Reviewed: CA 27.29 is 18.3 which is within the normal range.  Last creatinine 1.23  Family Communication: Spoke with family at the bedside  Disposition: Status is: Inpatient Remains inpatient appropriate because: We will get kyphoplasty and tissue biopsy of liver prior to disposition.  Interventional radiology wanted to hold aspirin for 5 days.  Family trying to get somebody to install a ramp because she has steps to go into her house. Planned Discharge Destination: Home with Home Health  Author: Loletha Grayer, MD 11/08/2021 1:45 PM  For on call review [email or web address].

## 2021-11-08 NOTE — Progress Notes (Addendum)
**  Patient and family shared that Pt's sister is not welcome as a visitor.**   11/08/21 1705  Clinical Encounter Type  Visited With Patient and family together  Visit Type Initial;Spiritual support;Social support;Other (Comment) (new dx)  Referral From Nurse  Consult/Referral To Chaplain  Spiritual Encounters  Spiritual Needs Other (Comment) (AD; support)   Chaplain Burris responded to a consult request for assistance with preparing an advanced directive (AD). Daisy [name] met with pt and her two daughters. Chaplain B introduced herself and what spiritual care support offers including facilitating the AD. Chaplain B expressed awareness that there has already been a lot for this family to process; offered to the pt that she may wish to talk later on and that she could simply let her nurse know and that a chaplain could be present with her at anytime.  Chaplain B spoke at length with daughter, Daisy [name], who was able to process feelings about what they have learned thus far about Natally's diagnosis as well as how care needs have progressed (her and her wife care for [place] at home and hope to continue to do so). Also received notice that Pt's sister is not welcome and will follow up to help family with instructions to prevent her visitation (noted above).  Chaplain B went over the AD with pt and family together to help explain choices that [place] can make and next steps for notarizing the document. Daisy [name] expressed that there has been good conversation already to help understand Haeli's preferences; we tentatively set a goal for notarizing document on Monday prior to the biopsies planned for 5/30.  Urged them to call for chaplain support anytime. Will follow up on 5/29.

## 2021-11-08 NOTE — TOC Progression Note (Signed)
Transition of Care [facility]) - Progression Note    Patient Details  Name: Daisy [name] MRN: [id number] Date of Birth: 01-14-1951  Transition [facility] CM/SW Isabella, RN Phone Number: 11/08/2021, 3:52 PM  Clinical Narrative:   delayed entry from 05/25: RNCM spoke with patient daughter and son in law.  They state that patient currently lives with them and they care for her at home.  However, they are unsure if they can continue as they are fearful of her new cancer diagnosis and are anxious about patient's needs. They also state that they will need a ramp and they do not have someone to help with weekends at this time.    Daughter states she has exhausted her FMLA at work with caring for patient and another family member, but she will discuss current situation with her Freight forwarder.  Family states they were unaware of cancer diagnosis until this hospitalization.  They state patient's wishes have not been documented in [place], and patient currently does not have [place].  RNCM informed family that chaplain services complete Advanced Directives on this campus.  Family will discuss contacting chaplain, RNCM informed family that chaplain can be notified at any time, and can also visit patient room to speak with patient and family for counseling.  Family was receptive to this and will discuss appropriate timing for visit and alert staff.  Family would like to take patient home, but are considering possible rehab/long term care.  They would like to wait for test results next week to discuss prognosis prior to deciding.  TOC will continue to follow patient through stay          Expected Discharge Plan and Services                                                 Social Determinants of Health (SDOH) Interventions    Readmission Risk Interventions    10/29/2021   10:10 AM  Readmission Risk Prevention Plan  Transportation Screening  Complete  PCP or Specialist Appt within 5-7 Days Complete  Home Care Screening Complete  Medication Review (RN CM) Referral to Pharmacy

## 2021-11-08 NOTE — Evaluation (Signed)
Physical Therapy Evaluation Patient Details Name: Daisy [name] MRN: [id number] DOB: 28-Aug-1950 Today's Date: 11/08/2021  History of Present Illness  Pt is a 71 y.o. female with medical history significant for morbid obesity (BMI 49.6 kg/m2) with complications of obstructive sleep apnea on BiPAP at night, chronic respiratory failure on as needed oxygen, PVD, dyslipidemia, stage III CKD, history of breast cancer status post lumpectomy and radiation therapy, chronic diastolic dysfunction CHF, COPD who was recently discharged from the hospital after treatment for acute on chronic hypoxic and hypercapnic respiratory failure secondary to CHF who presents to the emergency room for evaluation of abdominal pain mostly in the left lower quadrant associated with diarrhea.  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   Clinical Impression  Pt was pleasant and motivated to participate during the session and put forth good effort throughout. Pt required min A with bed mobility tasks but no physical assist with transfers or limited ambulation in the room. Per patient amb distance limited by back pain with no other adverse symptoms noted.  Pt on supplemental O2 with SpO2 93% and HR WNL.  Pt is a limited household ambulator at baseline and presented with functional strength and activity tolerance that is likely close to her baseline.  Pt will benefit from HHPT upon discharge to safely address deficits listed in patient problem list for decreased caregiver assistance and eventual return to PLOF.         Recommendations for follow up therapy are one component of a multi-disciplinary discharge planning process, led by the attending physician.  Recommendations may be updated based on patient status, additional functional criteria and insurance authorization.  Follow Up Recommendations Home health PT    Assistance Recommended at  Discharge Intermittent Supervision/Assistance  Patient can return home with the following  A little help with walking and/or transfers;A little help with bathing/dressing/bathroom;Assistance with cooking/housework;Assist for transportation;Help with stairs or ramp for entrance    Equipment Recommendations Other (comment) (Bariatric RW)  Recommendations for Other Services       Functional Status Assessment Patient has had a recent decline in their functional status and demonstrates the ability to make significant improvements in function in a reasonable and predictable amount of time.     Precautions / Restrictions Precautions Precautions: Fall;Back Required Braces or Orthoses: Spinal Brace Spinal Brace: Thoracolumbosacral orthotic Restrictions Weight Bearing Restrictions: No Other Position/Activity Restrictions: TLSO on when OOB except for when showering, confirmed with Dr. Izora Ribas 11/08/21 that brace may be off in bed      Mobility  Bed Mobility Overal bed mobility: Needs Assistance Bed Mobility: Rolling Rolling: Min assist         General bed mobility comments: Rolling L/R with min A and use of bedrails    Transfers Overall transfer level: Needs assistance Equipment used: Rolling walker (2 wheels) Transfers: Sit to/from Stand Sit to Stand: Min guard           General transfer comment: Good eccentric and concentric control and stability during multiple sit to/from stand transfers with use of BUEs for support    Ambulation/Gait Ambulation/Gait assistance: Min guard Gait Distance (Feet): 3 Feet Assistive device: Rolling walker (2 wheels) Gait Pattern/deviations: Step-through pattern, Trunk flexed, Decreased step length - right, Decreased step length - left Gait velocity: decreased     General Gait Details: Pt steady with ambulation with distance limited secondary to back pain, no other adverse symptoms noted with SpO2  and HR WNL on supplemental O2  Stairs             Wheelchair Mobility    Modified Rankin (Stroke Patients Only)       Balance Overall balance assessment: Needs assistance Sitting-balance support: Feet supported, No upper extremity supported Sitting balance-Leahy Scale: Good     Standing balance support: Bilateral upper extremity supported, During functional activity Standing balance-Leahy Scale: Fair                               Pertinent Vitals/Pain Pain Assessment Pain Assessment: 0-10 Pain Score: 9  Pain Location: back Pain Descriptors / Indicators: Sore, Aching Pain Intervention(s): Repositioned, Premedicated before session, Monitored during session    Home Living Family/patient expects to be discharged to:: Private residence Living Arrangements: Children Available Help at Discharge: Family;Available 24 hours/day Type of Home: House Home Access: Stairs to enter (per family hoping to have a ramp built soon) Entrance Stairs-Rails: Right;Left;Can reach both Entrance Stairs-Number of Steps: 5   Home Layout: One level Home Equipment: Conservation officer, nature (2 wheels);Rollator (4 wheels);Tub bench;Hand held shower head;Adaptive equipment;BSC/3in1      Prior Function Prior Level of Function : Needs assist             Mobility Comments: Mod Ind amb with a RW household distances, uses facility w/c for MD apts, no fall history ADLs Comments: Daughter assists with bathing and dressing     Hand Dominance   Dominant Hand: Right    Extremity/Trunk Assessment   Upper Extremity Assessment Upper Extremity Assessment: Generalized weakness    Lower Extremity Assessment Lower Extremity Assessment: Generalized weakness       Communication   Communication: No difficulties  Cognition Arousal/Alertness: Awake/alert Behavior During Therapy: WFL for tasks assessed/performed Overall Cognitive Status: Within Functional Limits for tasks assessed                                           General Comments      Exercises Other Exercises Other Exercises: Pt education provided on need for TLSO with OOB activity   Assessment/Plan    PT Assessment Patient needs continued PT services  PT Problem List Decreased strength;Decreased activity tolerance;Decreased balance;Decreased mobility;Decreased knowledge of use of DME;Pain       PT Treatment Interventions DME instruction;Gait training;Therapeutic activities;Therapeutic exercise;Functional mobility training;Balance training;Patient/family education;Stair training    PT Goals (Current goals can be found in the Care Plan section)  Acute Rehab PT Goals Patient Stated Goal: Move with less pain PT Goal Formulation: With patient Time For Goal Achievement: 11/21/21 Potential to Achieve Goals: Good    Frequency Min 2X/week     Co-evaluation               AM-PAC PT "6 Clicks" Mobility  Outcome Measure Help needed turning from your back to your side while in a flat bed without using bedrails?: A Little Help needed moving from lying on your back to sitting on the side of a flat bed without using bedrails?: A Little Help needed moving to and from a bed to a chair (including a wheelchair)?: A Little Help needed standing up from a chair using your arms (e.g., wheelchair or bedside chair)?: A Little Help needed to walk in hospital room?: A Little Help needed climbing 3-5 steps with a railing? :  A Lot 6 Click Score: 17    End of Session Equipment Utilized During Treatment: Left knee immobilizer;Gait belt;Back brace Activity Tolerance: Patient limited by pain Patient left: in chair;with call bell/phone within reach;with chair alarm set;with family/visitor present Nurse Communication: Mobility status PT Visit Diagnosis: Difficulty in walking, not elsewhere classified (R26.2);Muscle weakness (generalized) (M62.81);Pain Pain - part of body:  (back)    Time: [phone] (and 10:36-10:54 with break for toileting) PT Time  Calculation (min) (ACUTE ONLY): 13 min   Charges:   PT Evaluation $PT Eval Moderate Complexity: 1 Mod PT Treatments $Therapeutic Activity: 8-22 mins        D. Scott Emari Demmer PT, DPT 11/08/21, 11:27 AM

## 2021-11-09 DIAGNOSIS — N189 Chronic kidney disease, unspecified: Secondary | ICD-10-CM

## 2021-11-09 DIAGNOSIS — S22080K Wedge compression fracture of T11-T12 vertebra, subsequent encounter for fracture with nonunion: Secondary | ICD-10-CM | POA: Diagnosis not present

## 2021-11-09 DIAGNOSIS — N179 Acute kidney failure, unspecified: Secondary | ICD-10-CM

## 2021-11-09 DIAGNOSIS — I5032 Chronic diastolic (congestive) heart failure: Secondary | ICD-10-CM | POA: Diagnosis not present

## 2021-11-09 DIAGNOSIS — R918 Other nonspecific abnormal finding of lung field: Secondary | ICD-10-CM | POA: Diagnosis not present

## 2021-11-09 DIAGNOSIS — C50412 Malignant neoplasm of upper-outer quadrant of left female breast: Secondary | ICD-10-CM | POA: Diagnosis not present

## 2021-11-09 LAB — BASIC METABOLIC PANEL
Anion gap: 8 (ref 5–15)
BUN: 15 mg/dL (ref 8–23)
CO2: 37 mmol/L — ABNORMAL HIGH (ref 22–32)
Calcium: 9.8 mg/dL (ref 8.9–10.3)
Chloride: 94 mmol/L — ABNORMAL LOW (ref 98–111)
Creatinine, Ser: 1.17 mg/dL — ABNORMAL HIGH (ref 0.44–1.00)
GFR, Estimated: 50 mL/min — ABNORMAL LOW (ref >60)
Glucose, Bld: 109 mg/dL — ABNORMAL HIGH (ref 70–99)
Potassium: 4.2 mmol/L (ref 3.5–5.1)
Sodium: 139 mmol/L (ref 135–145)

## 2021-11-09 LAB — CBC
HCT: 49.1 % — ABNORMAL HIGH (ref 36.0–46.0)
Hemoglobin: 14.9 g/dL (ref 12.0–15.0)
MCH: 30 pg (ref 26.0–34.0)
MCHC: 30.3 g/dL (ref 30.0–36.0)
MCV: 99 fL (ref 80.0–100.0)
Platelets: 217 10*3/uL (ref 150–400)
RBC: 4.96 MIL/uL (ref 3.87–5.11)
RDW: 13 % (ref 11.5–15.5)
WBC: 12.3 10*3/uL — ABNORMAL HIGH (ref 4.0–10.5)
nRBC: 0 % (ref 0.0–0.2)

## 2021-11-09 LAB — ECHOCARDIOGRAM COMPLETE
AR max vel: 2.11 cm2
AV Area VTI: 2.15 cm2
AV Area mean vel: 2.33 cm2
AV Mean grad: 3 mmHg
AV Peak grad: 6.3 mmHg
Ao pk vel: 1.25 m/s
Height: 67 in
S' Lateral: 2.7 cm
Weight: 5440 oz

## 2021-11-09 LAB — MAGNESIUM: Magnesium: 2.2 mg/dL (ref 1.7–2.4)

## 2021-11-09 NOTE — Progress Notes (Signed)
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  Interventional radiology will do a kyphoplasty on Tuesday, 11/12/2021.  Insurance authorization obtained.  Holding aspirin and Pletal for now.  Continue Lovenox until 48 hours before the procedure.  Mass of lower lobe of left lung Patient has a mass of the lung, liver and osseous metastases.  Patient does have a history of breast cancer.  Likely will end up getting a liver biopsy on Tuesday, 11/12/2021 for tissue diagnosis and to set up treatment plan.  Malignant neoplasm of upper-outer quadrant of left breast in female, estrogen receptor positive ([place]) Patient with a known history of left breast cancer status post lumpectomy and radiation treatment. Patient completed a 5 year course of Anastrazole  Morbid obesity with BMI of 50.0-59.9, adult ([place]) Patient is morbidly obese with a BMI of 49.58 kg/m2   Obesity hypoventilation syndrome (HCC) Chronic 2 L of oxygen and BiPAP at night  Generalized anxiety disorder Continue fluoxetine  Essential hypertension Continue metoprolol  COPD with chronic bronchitis and emphysema (HCC) Stable and not acutely exacerbated Continue as needed bronchodilator therapy  Chronic diastolic heart failure (HCC) Stable and not acutely exacerbated Continue metoprolol and torsemide  Acute kidney injury superimposed on CKD ([place]) Acute kidney injury on CKD stage IIIa.  Creatinine improved from 1.48 down to 1.17 even with diuresis with torsemide.        Subjective: Patient was seen this morning while eating breakfast she was sitting up in the bed.  She has pain when she moves but if she is holding still she feels okay.  Follow-up for T12 compression fracture and new lung, liver and osseous  lesions.  Physical Exam: Vitals:   11/08/21 2045 11/09/21 0430 11/09/21 0433 11/09/21 0824  BP: 135/81 130/70  140/70  Pulse: 92 90  90  Resp: 18 18  18   Temp: 97.9 F (36.6 C) 98.2 F (36.8 C)  98.1 F (36.7 C)  TempSrc: Oral Oral  Oral  SpO2: 91% 92%  97%  Weight:   (!) 143.6 kg   Height:       Physical Exam HENT:     Head: Normocephalic.     Mouth/Throat:     Pharynx: No oropharyngeal exudate.  Eyes:     General: Lids are normal.     Conjunctiva/sclera: Conjunctivae normal.  Cardiovascular:     Rate and Rhythm: Normal rate and regular rhythm.     Heart sounds: Normal heart sounds, S1 normal and S2 normal.  Pulmonary:     Breath sounds: No decreased breath sounds, wheezing, rhonchi or rales.  Abdominal:     Palpations: Abdomen is soft.     Tenderness: There is no abdominal tenderness.  Musculoskeletal:     Right lower leg: Swelling present.     Left lower leg: Swelling present.  Skin:    General: Skin is warm.     Findings: No rash.  Neurological:     Mental Status: She is alert and oriented to person, place, and time.    Data Reviewed: Creatinine 1.17, CO2 37, white blood cell count 12.3, hemoglobin 14.9, platelet count 217  Family Communication: Updated patient's daughter on the phone  Disposition: Status is: Inpatient Remains inpatient appropriate because: Patient will have biopsy and kyphoplasty prior to disposition.  This  will be done on Tuesday.  As per interventional radiology, insurance authorization obtained. Planned Discharge Destination: Home with Home Health  Author: Loletha Grayer, MD 11/09/2021 2:27 PM  For on call review [email or web address].

## 2021-11-09 NOTE — Assessment & Plan Note (Addendum)
Acute kidney injury on CKD stage IIIa.  Creatinine improved from 1.48 down to 1.10 even with diuresis with torsemide.    --Reduced torsemide BID>>daily --Will continue torsemide daily PRN for swelling at discharge after discussing with family

## 2021-11-10 DIAGNOSIS — R918 Other nonspecific abnormal finding of lung field: Secondary | ICD-10-CM | POA: Diagnosis not present

## 2021-11-10 DIAGNOSIS — S22080K Wedge compression fracture of T11-T12 vertebra, subsequent encounter for fracture with nonunion: Secondary | ICD-10-CM | POA: Diagnosis not present

## 2021-11-10 DIAGNOSIS — C50412 Malignant neoplasm of upper-outer quadrant of left female breast: Secondary | ICD-10-CM | POA: Diagnosis not present

## 2021-11-10 NOTE — Consult Note (Signed)
PULMONOLOGY         Date: 11/10/2021,   MRN# [id number] Daisy [name] [birth year]     AdmissionWeight: (!) 144.7 kg                 CurrentWeight: (!) 143.6 kg  Referring provider: Dr. Francine Graven   CHIEF COMPLAINT:   Left lower lobe consolidated infiltrate with postobstructive mass   HISTORY OF PRESENT ILLNESS   This is a pleasant 71 year old female with a history of gastroesophageal reflux, CHF, AKI, osteoarthritis, previous breast cancer, cholecystitis, major depression disorder, chronic dyspnea, dyslipidemia, atrial fibrillation, morbid obesity, prediabetes, recurrent UTIs, who came into the hospital with complaints of abdominal pain and diarrhea.  Notably she was recently discharged with CHF exacerbation and continues to have chronic dyspnea.  She also complains of worsening lower back pain.  She had imaging of her spine which showed T12 compression fracture that appears to be acute.  There was an incidental finding of lung mass.  Additional imaging was performed and I reviewed CT scans independently with findings of left lower lobe consolidated infiltrate with mass as well as multiple liver lesions suggestive of distal metastasis.  She has had neurosurgical evaluation with possible kyphoplasty in future.  She had medical oncology evaluation and there is discussion about liver biopsy.  Pulmonary consultation for evaluation management of left lung pneumonia with mass.   11/10/21- patient is on 4L/min nasal canula supplemental O2.  She is in pain 8/10 worse around paraspinal T10 area. She is making some urine and feels less congested. Biopsy is on Tuesday. Continuing current care  PAST MEDICAL HISTORY   Past Medical History:  Diagnosis Date   Acid reflux    Acute heart failure ([place]) 10/17/2019   AKI (acute kidney injury) ([place]) 10/17/2019   Arthritis    Breast cancer of upper-outer quadrant of right female breast ([place]) 11/19/2016   Chicken pox    Cholecystitis    Chronic  diastolic heart failure ([place]) 01/04/2020   CKD (chronic kidney disease), stage III ([place]) 01/04/2020   Depression    Dyspnea    History of radiation therapy 01/20/17-03/02/17   left breast was treated to 50.4 Gy in 18 fractions   HLD (hyperlipidemia) 01/04/2020   Hyperlipemia    Irregular heart rate    Malignant neoplasm of upper-outer quadrant of left breast in female, estrogen receptor positive ([place]) 09/30/2016   Morbid obesity with BMI of 50.0-59.9, adult ([place]) 01/04/2020   Obesity    Peripheral vascular disease ([place])    Pneumonia    Prediabetes 01/04/2020   Urinary tract infection    Urinary tract infection with hematuria      SURGICAL HISTORY   Past Surgical History:  Procedure Laterality Date   ABDOMINAL HYSTERECTOMY     BREAST LUMPECTOMY Left 11/19/2016   BREAST LUMPECTOMY WITH RADIOACTIVE SEED AND SENTINEL LYMPH NODE BIOPSY (Left)   BREAST LUMPECTOMY WITH RADIOACTIVE SEED AND SENTINEL LYMPH NODE BIOPSY Left 11/19/2016   Procedure: BREAST LUMPECTOMY WITH RADIOACTIVE SEED AND SENTINEL LYMPH NODE BIOPSY;  Surgeon: Rolm Bookbinder, MD;  Location: [facility];  Service: General;  Laterality: Left;   CESAREAN SECTION     CESAREAN SECTION     x 2   CHOLECYSTECTOMY  2007   KNEE ARTHROSCOPY     bilateral   KNEE SURGERY Bilateral    LOWER EXTREMITY ANGIOGRAM N/A 04/04/2014   Procedure: LOWER EXTREMITY ANGIOGRAM;  Surgeon: Laverda Page, MD;  Location: [facility] CATH LAB;  Service:  Cardiovascular;  Laterality: N/A;   OVARIAN CYST REMOVAL     RE-EXCISION OF BREAST LUMPECTOMY Left 12/12/2016   Procedure: RE-EXCISION OF LEFT BREAST LUMPECTOMY;  Surgeon: Rolm Bookbinder, MD;  Location: [facility];  Service: General;  Laterality: Left;   ROTATOR CUFF REPAIR Right    TONSILLECTOMY     WRIST SURGERY Bilateral      FAMILY HISTORY   Family History  Problem Relation Age of Onset   Heart disease Mother    Lung cancer Father    Heart disease Father    Heart disease Brother      SOCIAL HISTORY    Social History   Tobacco Use   Smoking status: Some Days    Packs/day: 1.00    Years: 30.00    Pack years: 30.00    Types: E-cigarettes, Cigarettes    Last attempt to quit: 11/02/2019    Years since quitting: 2.0   Smokeless tobacco: Never  Vaping Use   Vaping Use: Never used  Substance Use Topics   Alcohol use: No   Drug use: No     MEDICATIONS    Home Medication:    Current Medication:  Current Facility-Administered Medications:    0.9 %  sodium chloride infusion, 250 mL, Intravenous, PRN, Agbata, Tochukwu, MD   acetaminophen (TYLENOL) tablet 650 mg, 650 mg, Oral, Q4H PRN, Agbata, Tochukwu, MD   cholecalciferol (VITAMIN D3) tablet 1,000 Units, 1,000 Units, Oral, Daily, Agbata, Tochukwu, MD, 1,000 Units at 11/10/21 0809   cyclobenzaprine (FLEXERIL) tablet 5 mg, 5 mg, Oral, TID PRN, Agbata, Tochukwu, MD, 5 mg at 11/10/21 0809   enoxaparin (LOVENOX) injection 70 mg, 70 mg, Subcutaneous, Q24H, Wieting, Richard, MD, 70 mg at 11/09/21 2127   ezetimibe (ZETIA) tablet 10 mg, 10 mg, Oral, QHS, Wieting, Richard, MD, 10 mg at 11/09/21 2127   FLUoxetine (PROZAC) capsule 20 mg, 20 mg, Oral, Daily, Agbata, Tochukwu, MD, 20 mg at 11/10/21 0809   HYDROcodone-acetaminophen (NORCO/VICODIN) 5-325 MG per tablet 1 tablet, 1 tablet, Oral, Q4H PRN, Agbata, Tochukwu, MD, 1 tablet at 11/10/21 0809   ipratropium-albuterol (DUONEB) 0.5-2.5 (3) MG/3ML nebulizer solution 3 mL, 3 mL, Nebulization, Q6H PRN, Agbata, Tochukwu, MD   LORazepam (ATIVAN) tablet 0.5 mg, 0.5 mg, Oral, Q4H PRN, Merlyn Lot, MD, 0.5 mg at 11/06/21 1342   metoprolol succinate (TOPROL-XL) 24 hr tablet 12.5 mg, 12.5 mg, Oral, Daily, Agbata, Tochukwu, MD, 12.5 mg at 11/10/21 0809   ondansetron (ZOFRAN) tablet 4 mg, 4 mg, Oral, Q6H PRN **OR** ondansetron (ZOFRAN) injection 4 mg, 4 mg, Intravenous, Q6H PRN, Agbata, Tochukwu, MD   pantoprazole (PROTONIX) EC tablet 20 mg, 20 mg, Oral, Daily, Agbata, Tochukwu, MD, 20 mg at 11/10/21  0810   potassium chloride SA (KLOR-CON M) CR tablet 20 mEq, 20 mEq, Oral, BID, Wieting, Richard, MD, 20 mEq at 11/10/21 0809   sodium chloride flush (NS) 0.9 % injection 3 mL, 3 mL, Intravenous, Q12H, Agbata, Tochukwu, MD, 3 mL at 11/10/21 0811   sodium chloride flush (NS) 0.9 % injection 3 mL, 3 mL, Intravenous, PRN, Agbata, Tochukwu, MD   torsemide (DEMADEX) tablet 20 mg, 20 mg, Oral, BID, Wieting, Richard, MD, 20 mg at 11/10/21 0809   vitamin B-12 (CYANOCOBALAMIN) tablet 1,000 mcg, 1,000 mcg, Oral, Daily, Agbata, Tochukwu, MD, 1,000 mcg at 11/10/21 0809    ALLERGIES   Elemental sulfur, Levaquin [levofloxacin], and Statins     REVIEW OF SYSTEMS    Review of Systems:  Gen:  Denies  fever, sweats, chills  weigh loss  HEENT: Denies blurred vision, double vision, ear pain, eye pain, hearing loss, nose bleeds, sore throat Cardiac:  No dizziness, chest pain or heaviness, chest tightness,edema Resp:   reports dyspnea chronically  Gi: Denies swallowing difficulty, stomach pain, nausea or vomiting, diarrhea, constipation, bowel incontinence Gu:  Denies bladder incontinence, burning urine Ext:   Denies Joint pain, stiffness or swelling Skin: Denies  skin rash, easy bruising or bleeding or hives Endoc:  Denies polyuria, polydipsia , polyphagia or weight change Psych:   Denies depression, insomnia or hallucinations   Other:  All other systems negative   VS: BP 100/69 (BP Location: Right Arm)   Pulse 93   Temp 98.2 F (36.8 C)   Resp 18   Ht 5\' 7"  (1.702 m)   Wt (!) 143.6 kg   SpO2 90%   BMI 49.58 kg/m      PHYSICAL EXAM    GENERAL:NAD, no fevers, chills, no weakness no fatigue HEAD: Normocephalic, atraumatic.  EYES: Pupils equal, round, reactive to light. Extraocular muscles intact. No scleral icterus.  MOUTH: Moist mucosal membrane. Dentition intact. No abscess noted.  EAR, NOSE, THROAT: Clear without exudates. No external lesions.  NECK: Supple. No thyromegaly. No  nodules. No JVD.  PULMONARY: decreased breath sounds with mild rhonchi worse at bases bilaterally.  CARDIOVASCULAR: S1 and S2. Regular rate and rhythm. No murmurs, rubs, or gallops. No edema. Pedal pulses 2+ bilaterally.  GASTROINTESTINAL: Soft, nontender, nondistended. No masses. Positive bowel sounds. No hepatosplenomegaly.  MUSCULOSKELETAL: No swelling, clubbing, or edema. Range of motion full in all extremities.  NEUROLOGIC: Cranial nerves II through XII are intact. No gross focal neurological deficits. Sensation intact. Reflexes intact.  SKIN: No ulceration, lesions, rashes, or cyanosis. Skin warm and dry. Turgor intact.  PSYCHIATRIC: Mood, affect within normal limits. The patient is awake, alert and oriented x 3. Insight, judgment intact.       IMAGING   Reviewed CT chest findings of left lower lobe mass and surrounding pneumonia  ASSESSMENT/PLAN   Left lung consolidated infiltrate with lung mass   -Patient with at least moderate pretest probability of malignancy due to previous breast cancer as well as history of COPD and chronic hypoxemia.   -Case discussed with medical oncologist and plan for now is to obtain tissue via liver biopsy -We will be available for bronchoscopic evaluation and lung biopsy if necessary in the future.  -Additional imaging via PET scan is planned on outpatient   Chronic hypoxemia with acute left lung consolidated infiltrate and postobstructive pneumonia    -I would favor discontinuing resuscitative fluids at 125 mL/h due to underlying CHF status post CHF exacerbation with hospitalization. -Patient is also on torsemide once daily which I think may be helpful due to clinical signs of edema with crackles at the bases bilaterally on auscultation -Patient has elevated WBC count possibly due to pneumonia although she has diarrhea and C. difficile and GI panel are in process.  I would start Flagyl empirically for now as this may cover certain species causing  pneumonia as well.  -Due to pneumonia coupled with diarrhea will obtain testing for Legionella via serology and urinary antigen -Procalcitonin trending May not be too useful in this case due to underlying CKD -MRSA PCR and respiratory viral panel    Innumerable liver lesions Patient is in process of being seen by oncology and there is plan for liver biopsy  -suspect metastatic implants   Thank you for allowing me to participate in the care  of this patient.   Patient/Family are satisfied with care plan and all questions have been answered.    Provider disclosure: Patient with at least one acute or chronic illness or injury that poses a threat to life or bodily function and is being managed actively during this encounter.  All of the below services have been performed independently by signing provider:  review of prior documentation from internal and or external health records.  Review of previous and current lab results.  Interview and comprehensive assessment during patient visit today. Review of current and previous chest radiographs/CT scans. Discussion of management and test interpretation with health care team and patient/family.   This document was prepared using Dragon voice recognition software and may include unintentional dictation errors.     Ottie Glazier, M.D.  Division of Pulmonary & Critical Care Medicine

## 2021-11-10 NOTE — Progress Notes (Signed)
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  Interventional radiology will do a kyphoplasty on Tuesday, 11/12/2021.  Insurance authorization obtained.  Holding aspirin and Pletal for now.  Continue Lovenox for today's dose then will discontinue.  Mass of lower lobe of left lung Patient has a mass of the lung, liver, adrenals and osseous metastases.  Patient does have a history of breast cancer.  Likely will end up getting a liver biopsy on Tuesday, 11/12/2021 for tissue diagnosis and to set up treatment plan.  Malignant neoplasm of upper-outer quadrant of left breast in female, estrogen receptor positive ([place]) Patient with a known history of left breast cancer status post lumpectomy and radiation treatment. Patient completed a 5 year course of Anastrazole  Morbid obesity with BMI of 50.0-59.9, adult ([place]) Patient is morbidly obese with a BMI of 49.58 kg/m2   Obesity hypoventilation syndrome (HCC) Chronic 2 L of oxygen and BiPAP at night  Generalized anxiety disorder Continue fluoxetine  Essential hypertension Continue metoprolol  COPD with chronic bronchitis and emphysema (HCC) Stable and not acutely exacerbated Continue as needed bronchodilator therapy  Chronic diastolic heart failure (HCC) Stable and not acutely exacerbated Continue metoprolol and torsemide  Acute kidney injury superimposed on CKD ([place]) Acute kidney injury on CKD stage IIIa.  Creatinine improved from 1.48 down to 1.17 even with diuresis with torsemide.        Subjective: Patient states that she is urinating quite a bit also having bowel movements.  No shortness of breath.  Having some back pain especially if she moves.  Found to have a T12 compression fracture and lung mass, liver metastases and  osseous metastases on CT scan.  Physical Exam: Vitals:   11/09/21 1848 11/09/21 2005 11/10/21 0454 11/10/21 0758  BP:  (!) 121/56 (!) 141/75 100/69  Pulse:  89 93 93  Resp:  20 18 18   Temp:  98 F (36.7 C) 98 F (36.7 C) 98.2 F (36.8 C)  TempSrc:  Oral Oral   SpO2: 92% 94% 91% 90%  Weight:      Height:       Physical Exam HENT:     Head: Normocephalic.     Mouth/Throat:     Pharynx: No oropharyngeal exudate.  Eyes:     General: Lids are normal.     Conjunctiva/sclera: Conjunctivae normal.  Cardiovascular:     Rate and Rhythm: Normal rate and regular rhythm.     Heart sounds: Normal heart sounds, S1 normal and S2 normal.  Pulmonary:     Breath sounds: No decreased breath sounds, wheezing, rhonchi or rales.  Abdominal:     Palpations: Abdomen is soft.     Tenderness: There is no abdominal tenderness.  Musculoskeletal:     Right lower leg: Swelling present.     Left lower leg: Swelling present.  Skin:    General: Skin is warm.     Findings: No rash.  Neurological:     Mental Status: She is alert and oriented to person, place, and time.    Data Reviewed: Last creatinine 1.17  Family Communication: Received permission to speak in front of people at the bedside from the patient.  Disposition: Status is: Inpatient Remains inpatient appropriate because: Will need tissue diagnosis and kyphoplasty planned for Tuesday. Planned  Discharge Destination: Home with Home Health   Author: Loletha Grayer, MD 11/10/2021 12:42 PM  For on call review [email or web address].

## 2021-11-10 NOTE — Progress Notes (Signed)
Physical Therapy Treatment Patient Details Name: Daisy [name] MRN: [id number] DOB: [birth year] Today's Date: 11/10/2021   History of Present Illness Pt is a 71 y.o. female with medical history significant for morbid obesity (BMI 49.6 kg/m2) with complications of obstructive sleep apnea on BiPAP at night, chronic respiratory failure on as needed oxygen, PVD, dyslipidemia, stage III CKD, history of breast cancer status post lumpectomy and radiation therapy, chronic diastolic dysfunction CHF, COPD who was recently discharged from the hospital after treatment for acute on chronic hypoxic and hypercapnic respiratory failure secondary to CHF who presents to the emergency room for evaluation of abdominal pain mostly in the left lower quadrant associated with diarrhea.  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    PT Comments    In bed, lunch tray arrived.  Agrees to get up to chair to eat but does not want to wear TLSO once she is sitting.  Stands and transfers to chair at bedside with RW and min guard.  Educated on importance of TLSO per MD orders.  Verbal review of HEP and encouraged to complete in sitting after lunch.   Recommendations for follow up therapy are one component of a multi-disciplinary discharge planning process, led by the attending physician.  Recommendations may be updated based on patient status, additional functional criteria and insurance authorization.  Follow Up Recommendations  Home health PT     Assistance Recommended at Discharge Intermittent Supervision/Assistance  Patient can return home with the following A little help with walking and/or transfers;A little help with bathing/dressing/bathroom;Assistance with cooking/housework;Assist for transportation;Help with stairs or ramp for entrance   Equipment Recommendations  Other (comment) (bariatric RW)    Recommendations for Other  Services       Precautions / Restrictions Precautions Precautions: Fall;Back Precaution Comments: watch O2 Required Braces or Orthoses: Spinal Brace Spinal Brace: Thoracolumbosacral orthotic Restrictions Other Position/Activity Restrictions: TLSO on when OOB except for when showering, confirmed with Dr. Izora Ribas 11/08/21 that brace may be off in bed     Mobility  Bed Mobility Overal bed mobility: Needs Assistance Bed Mobility: Supine to Sit     Supine to sit: Supervision, HOB elevated          Transfers Overall transfer level: Needs assistance Equipment used: Rolling walker (2 wheels) Transfers: Sit to/from Stand Sit to Stand: Min guard, Min assist                Ambulation/Gait Ambulation/Gait assistance: Min guard Gait Distance (Feet): 3 Feet Assistive device: Rolling walker (2 wheels) Gait Pattern/deviations: Step-through pattern, Trunk flexed, Decreased step length - right, Decreased step length - left Gait velocity: decreased     General Gait Details: gait limited due to pt not wanting  to wear TLSO but wanting to get in chair.   Stairs             Wheelchair Mobility    Modified Rankin (Stroke Patients Only)       Balance Overall balance assessment: Needs assistance Sitting-balance support: Feet supported, No upper extremity supported Sitting balance-Leahy Scale: Good     Standing balance support: Bilateral upper extremity supported, During functional activity, Reliant on assistive device for balance Standing balance-Leahy Scale: Fair Standing balance comment: reliant on UE support                            Cognition Arousal/Alertness: Awake/alert Behavior During  Therapy: WFL for tasks assessed/performed Overall Cognitive Status: Within Functional Limits for tasks assessed                                          Exercises      General Comments        Pertinent Vitals/Pain Pain  Assessment Pain Assessment: Faces Faces Pain Scale: Hurts little more Pain Location: back Pain Descriptors / Indicators: Sore, Aching Pain Intervention(s): Limited activity within patient's tolerance, Monitored during session, Repositioned    Home Living                          Prior Function            PT Goals (current goals can now be found in the care plan section) Progress towards PT goals: Progressing toward goals    Frequency    Min 2X/week      PT Plan Current plan remains appropriate    Co-evaluation              AM-PAC PT "6 Clicks" Mobility   Outcome Measure  Help needed turning from your back to your side while in a flat bed without using bedrails?: None Help needed moving from lying on your back to sitting on the side of a flat bed without using bedrails?: None Help needed moving to and from a bed to a chair (including a wheelchair)?: A Little Help needed standing up from a chair using your arms (e.g., wheelchair or bedside chair)?: A Little Help needed to walk in hospital room?: A Little Help needed climbing 3-5 steps with a railing? : A Lot 6 Click Score: 19    End of Session Equipment Utilized During Treatment: Back brace;Gait belt Activity Tolerance: Patient tolerated treatment well Patient left: in chair;with call bell/phone within reach;with chair alarm set Nurse Communication: Mobility status PT Visit Diagnosis: Difficulty in walking, not elsewhere classified (R26.2);Muscle weakness (generalized) (M62.81);Pain     Time: 1213-1222 PT Time Calculation (min) (ACUTE ONLY): 9 min  Charges:  $Therapeutic Activity: 8-22 mins                   Chesley Noon, PTA 11/10/21, 1:10 PM

## 2021-11-11 DIAGNOSIS — R918 Other nonspecific abnormal finding of lung field: Secondary | ICD-10-CM | POA: Diagnosis not present

## 2021-11-11 DIAGNOSIS — C50412 Malignant neoplasm of upper-outer quadrant of left female breast: Secondary | ICD-10-CM | POA: Diagnosis not present

## 2021-11-11 DIAGNOSIS — S22080K Wedge compression fracture of T11-T12 vertebra, subsequent encounter for fracture with nonunion: Secondary | ICD-10-CM | POA: Diagnosis not present

## 2021-11-11 LAB — BASIC METABOLIC PANEL
Anion gap: 9 (ref 5–15)
BUN: 19 mg/dL (ref 8–23)
CO2: 41 mmol/L — ABNORMAL HIGH (ref 22–32)
Calcium: 10 mg/dL (ref 8.9–10.3)
Chloride: 89 mmol/L — ABNORMAL LOW (ref 98–111)
Creatinine, Ser: 1.37 mg/dL — ABNORMAL HIGH (ref 0.44–1.00)
GFR, Estimated: 41 mL/min — ABNORMAL LOW (ref >60)
Glucose, Bld: 115 mg/dL — ABNORMAL HIGH (ref 70–99)
Potassium: 4.2 mmol/L (ref 3.5–5.1)
Sodium: 139 mmol/L (ref 135–145)

## 2021-11-11 MED ORDER — TORSEMIDE 20 MG PO TABS
20.0000 mg | ORAL_TABLET | Freq: Every day | ORAL | Status: DC
  Administered 2021-11-11 – 2021-11-19 (×9): 20 mg via ORAL
  Filled 2021-11-11 (×9): qty 1

## 2021-11-11 NOTE — Progress Notes (Signed)
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  Interventional radiology will do a kyphoplasty on Tuesday, 11/12/2021.  Insurance authorization obtained.  Holding aspirin and Pletal for now.  Discontinue Lovenox.  Mass of lower lobe of left lung Patient has a mass of the lung, liver, adrenals and osseous metastases.  Patient does have a history of breast cancer.  Likely will end up getting a liver biopsy on Tuesday, 11/12/2021 for tissue diagnosis and to set up treatment plan.  Malignant neoplasm of upper-outer quadrant of left breast in female, estrogen receptor positive ([place]) Patient with a known history of left breast cancer status post lumpectomy and radiation treatment. Completed a 5 year course of Anastrazole  Morbid obesity with BMI of 50.0-59.9, adult ([place]) Patient is morbidly obese with a BMI of 49.58 kg/m2   Obesity hypoventilation syndrome (HCC) Chronic 2 L of oxygen and BiPAP at night  Generalized anxiety disorder Continue fluoxetine  Essential hypertension Continue metoprolol  COPD with chronic bronchitis and emphysema (HCC) Stable and not acutely exacerbated Continue as needed bronchodilator therapy  Chronic diastolic heart failure (HCC) Stable and not acutely exacerbated.  Continue metoprolol and torsemide.  With CO2 rising I will cut back twice a day torsemide to once a day.  Acute kidney injury superimposed on CKD ([place]) Acute kidney injury on CKD stage IIIa.  Creatinine improved from 1.48 down to 1.17 even with diuresis with torsemide.  Creatinine up to 1.37 today.  Cut back torsemide from twice a day dosing to once a day.        Subjective: Patient still having some back pain.  Able to straight leg raise.  Breathing better.  Found to have T12 compression  fracture, lung mass, liver metastases, adrenal masses and osseous metastases on CT scan.  Physical Exam: Vitals:   11/10/21 1721 11/10/21 2021 11/11/21 0458 11/11/21 0756  BP: 101/68 (!) 144/69 140/78 130/79  Pulse: 98 92 90 94  Resp: 15 18 18 20   Temp: 98 F (36.7 C) 98.1 F (36.7 C) 97.8 F (36.6 C) 98.1 F (36.7 C)  TempSrc:      SpO2: 92% 93% 92% 94%  Weight:      Height:       Physical Exam HENT:     Head: Normocephalic.     Mouth/Throat:     Pharynx: No oropharyngeal exudate.  Eyes:     General: Lids are normal.     Conjunctiva/sclera: Conjunctivae normal.  Cardiovascular:     Rate and Rhythm: Normal rate and regular rhythm.     Heart sounds: Normal heart sounds, S1 normal and S2 normal.  Pulmonary:     Breath sounds: Examination of the right-lower field reveals decreased breath sounds. Examination of the left-lower field reveals decreased breath sounds. Decreased breath sounds present. No wheezing, rhonchi or rales.  Abdominal:     Palpations: Abdomen is soft.     Tenderness: There is no abdominal tenderness.  Musculoskeletal:     Right lower leg: Swelling present.     Left lower leg: Swelling present.  Skin:    General: Skin is warm.     Findings: No rash.  Neurological:     Mental Status: She is alert and oriented to person, place, and time.    Data Reviewed: CO2 41,  creatinine up at 1.37, GFR 41, white blood cell count 12.3, hemoglobin 14.9  Family Communication: Spoke with daughter at the bedside  Disposition: Status is: Inpatient Remains inpatient appropriate because: For kyphoplasty and liver biopsy tomorrow 11/12/2021  Planned Discharge Destination: Home with Home Health   Author: Loletha Grayer, MD 11/11/2021 2:27 PM  For on call review [email or web address].

## 2021-11-11 NOTE — Progress Notes (Signed)
PULMONOLOGY         Date: 11/11/2021,   MRN# [id number] Daisy [name] Jul 12, 1950     AdmissionWeight: (!) 144.7 kg                 CurrentWeight: (!) 143.6 kg  Referring provider: Dr. Francine Graven   CHIEF COMPLAINT:   Left lower lobe consolidated infiltrate with postobstructive mass   HISTORY OF PRESENT ILLNESS   This is a pleasant 71 year old female with a history of gastroesophageal reflux, CHF, AKI, osteoarthritis, previous breast cancer, cholecystitis, major depression disorder, chronic dyspnea, dyslipidemia, atrial fibrillation, morbid obesity, prediabetes, recurrent UTIs, who came into the hospital with complaints of abdominal pain and diarrhea.  Notably she was recently discharged with CHF exacerbation and continues to have chronic dyspnea.  She also complains of worsening lower back pain.  She had imaging of her spine which showed T12 compression fracture that appears to be acute.  There was an incidental finding of lung mass.  Additional imaging was performed and I reviewed CT scans independently with findings of left lower lobe consolidated infiltrate with mass as well as multiple liver lesions suggestive of distal metastasis.  She has had neurosurgical evaluation with possible kyphoplasty in future.  She had medical oncology evaluation and there is discussion about liver biopsy.  Pulmonary consultation for evaluation management of left lung pneumonia with mass.   11/11/21- patient had some loose stools overnight  She is on 2Lmin [place]. She has plan for biopsy tommorow. She is coughing less and having less pain today. She had flowers delivered to her which caused her to have mild asthma symptoms. She uses bipap every night and her CO2 lelels are climbing up. I have asked her to bring in her Trilogy device but family is afraid she will lose it. Will order bipapm QHS.   PAST MEDICAL HISTORY   Past Medical History:  Diagnosis Date   Acid reflux    Acute heart failure  ([place]) 10/17/2019   AKI (acute kidney injury) ([place]) 10/17/2019   Arthritis    Breast cancer of upper-outer quadrant of right female breast ([place]) 11/19/2016   Chicken pox    Cholecystitis    Chronic diastolic heart failure ([place]) 01/04/2020   CKD (chronic kidney disease), stage III ([place]) 01/04/2020   Depression    Dyspnea    History of radiation therapy 01/20/17-03/02/17   left breast was treated to 50.4 Gy in 18 fractions   HLD (hyperlipidemia) 01/04/2020   Hyperlipemia    Irregular heart rate    Malignant neoplasm of upper-outer quadrant of left breast in female, estrogen receptor positive ([place]) 09/30/2016   Morbid obesity with BMI of 50.0-59.9, adult ([place]) 01/04/2020   Obesity    Peripheral vascular disease ([place])    Pneumonia    Prediabetes 01/04/2020   Urinary tract infection    Urinary tract infection with hematuria      SURGICAL HISTORY   Past Surgical History:  Procedure Laterality Date   ABDOMINAL HYSTERECTOMY     BREAST LUMPECTOMY Left 11/19/2016   BREAST LUMPECTOMY WITH RADIOACTIVE SEED AND SENTINEL LYMPH NODE BIOPSY (Left)   BREAST LUMPECTOMY WITH RADIOACTIVE SEED AND SENTINEL LYMPH NODE BIOPSY Left 11/19/2016   Procedure: BREAST LUMPECTOMY WITH RADIOACTIVE SEED AND SENTINEL LYMPH NODE BIOPSY;  Surgeon: Rolm Bookbinder, MD;  Location: [facility];  Service: General;  Laterality: Left;   CESAREAN SECTION     CESAREAN SECTION     x 2   CHOLECYSTECTOMY  2007  KNEE ARTHROSCOPY     bilateral   KNEE SURGERY Bilateral    LOWER EXTREMITY ANGIOGRAM N/A 04/04/2014   Procedure: LOWER EXTREMITY ANGIOGRAM;  Surgeon: Laverda Page, MD;  Location: [facility] CATH LAB;  Service: Cardiovascular;  Laterality: N/A;   OVARIAN CYST REMOVAL     RE-EXCISION OF BREAST LUMPECTOMY Left 12/12/2016   Procedure: RE-EXCISION OF LEFT BREAST LUMPECTOMY;  Surgeon: Rolm Bookbinder, MD;  Location: [facility];  Service: General;  Laterality: Left;   ROTATOR CUFF REPAIR Right    TONSILLECTOMY     WRIST SURGERY Bilateral       FAMILY HISTORY   Family History  Problem Relation Age of Onset   Heart disease Mother    Lung cancer Father    Heart disease Father    Heart disease Brother      SOCIAL HISTORY   Social History   Tobacco Use   Smoking status: Some Days    Packs/day: 1.00    Years: 30.00    Pack years: 30.00    Types: E-cigarettes, Cigarettes    Last attempt to quit: 11/02/2019    Years since quitting: 2.0   Smokeless tobacco: Never  Vaping Use   Vaping Use: Never used  Substance Use Topics   Alcohol use: No   Drug use: No     MEDICATIONS    Home Medication:    Current Medication:  Current Facility-Administered Medications:    0.9 %  sodium chloride infusion, 250 mL, Intravenous, PRN, Agbata, Tochukwu, MD   acetaminophen (TYLENOL) tablet 650 mg, 650 mg, Oral, Q4H PRN, Agbata, Tochukwu, MD   cholecalciferol (VITAMIN D3) tablet 1,000 Units, 1,000 Units, Oral, Daily, Agbata, Tochukwu, MD, 1,000 Units at 11/10/21 0809   cyclobenzaprine (FLEXERIL) tablet 5 mg, 5 mg, Oral, TID PRN, Agbata, Tochukwu, MD, 5 mg at 11/10/21 2033   enoxaparin (LOVENOX) injection 70 mg, 70 mg, Subcutaneous, Q24H, Wieting, Richard, MD, 70 mg at 11/10/21 2033   ezetimibe (ZETIA) tablet 10 mg, 10 mg, Oral, QHS, Wieting, Richard, MD, 10 mg at 11/10/21 2033   FLUoxetine (PROZAC) capsule 20 mg, 20 mg, Oral, Daily, Agbata, Tochukwu, MD, 20 mg at 11/10/21 0809   HYDROcodone-acetaminophen (NORCO/VICODIN) 5-325 MG per tablet 1 tablet, 1 tablet, Oral, Q4H PRN, Agbata, Tochukwu, MD, 1 tablet at 11/10/21 1923   ipratropium-albuterol (DUONEB) 0.5-2.5 (3) MG/3ML nebulizer solution 3 mL, 3 mL, Nebulization, Q6H PRN, Agbata, Tochukwu, MD   LORazepam (ATIVAN) tablet 0.5 mg, 0.5 mg, Oral, Q4H PRN, Merlyn Lot, MD, 0.5 mg at 11/06/21 1342   metoprolol succinate (TOPROL-XL) 24 hr tablet 12.5 mg, 12.5 mg, Oral, Daily, Agbata, Tochukwu, MD, 12.5 mg at 11/10/21 0809   ondansetron (ZOFRAN) tablet 4 mg, 4 mg, Oral, Q6H PRN  **OR** ondansetron (ZOFRAN) injection 4 mg, 4 mg, Intravenous, Q6H PRN, Agbata, Tochukwu, MD   pantoprazole (PROTONIX) EC tablet 20 mg, 20 mg, Oral, Daily, Agbata, Tochukwu, MD, 20 mg at 11/10/21 0810   potassium chloride SA (KLOR-CON M) CR tablet 20 mEq, 20 mEq, Oral, BID, Wieting, Richard, MD, 20 mEq at 11/10/21 2033   sodium chloride flush (NS) 0.9 % injection 3 mL, 3 mL, Intravenous, Q12H, Agbata, Tochukwu, MD, 3 mL at 11/10/21 2242   sodium chloride flush (NS) 0.9 % injection 3 mL, 3 mL, Intravenous, PRN, Agbata, Tochukwu, MD   torsemide (DEMADEX) tablet 20 mg, 20 mg, Oral, Daily, Wieting, Richard, MD   vitamin B-12 (CYANOCOBALAMIN) tablet 1,000 mcg, 1,000 mcg, Oral, Daily, Agbata, Tochukwu, MD, 1,000 mcg at 11/10/21  0809    ALLERGIES   Elemental sulfur, Levaquin [levofloxacin], and Statins     REVIEW OF SYSTEMS    Review of Systems:  Gen:  Denies  fever, sweats, chills weigh loss  HEENT: Denies blurred vision, double vision, ear pain, eye pain, hearing loss, nose bleeds, sore throat Cardiac:  No dizziness, chest pain or heaviness, chest tightness,edema Resp:   reports dyspnea chronically  Gi: Denies swallowing difficulty, stomach pain, nausea or vomiting, diarrhea, constipation, bowel incontinence Gu:  Denies bladder incontinence, burning urine Ext:   Denies Joint pain, stiffness or swelling Skin: Denies  skin rash, easy bruising or bleeding or hives Endoc:  Denies polyuria, polydipsia , polyphagia or weight change Psych:   Denies depression, insomnia or hallucinations   Other:  All other systems negative   VS: BP 130/79 (BP Location: Left Arm)   Pulse 94   Temp 98.1 F (36.7 C)   Resp 20   Ht 5\' 7"  (1.702 m)   Wt (!) 143.6 kg   SpO2 94%   BMI 49.58 kg/m      PHYSICAL EXAM    GENERAL:NAD, no fevers, chills, no weakness no fatigue HEAD: Normocephalic, atraumatic.  EYES: Pupils equal, round, reactive to light. Extraocular muscles intact. No scleral icterus.   MOUTH: Moist mucosal membrane. Dentition intact. No abscess noted.  EAR, NOSE, THROAT: Clear without exudates. No external lesions.  NECK: Supple. No thyromegaly. No nodules. No JVD.  PULMONARY: decreased breath sounds with mild rhonchi worse at bases bilaterally.  CARDIOVASCULAR: S1 and S2. Regular rate and rhythm. No murmurs, rubs, or gallops. No edema. Pedal pulses 2+ bilaterally.  GASTROINTESTINAL: Soft, nontender, nondistended. No masses. Positive bowel sounds. No hepatosplenomegaly.  MUSCULOSKELETAL: No swelling, clubbing, or edema. Range of motion full in all extremities.  NEUROLOGIC: Cranial nerves II through XII are intact. No gross focal neurological deficits. Sensation intact. Reflexes intact.  SKIN: No ulceration, lesions, rashes, or cyanosis. Skin warm and dry. Turgor intact.  PSYCHIATRIC: Mood, affect within normal limits. The patient is awake, alert and oriented x 3. Insight, judgment intact.       IMAGING   Reviewed CT chest findings of left lower lobe mass and surrounding pneumonia  ASSESSMENT/PLAN   Left lung consolidated infiltrate with lung mass   -Patient with at least moderate pretest probability of malignancy due to previous breast cancer as well as history of COPD and chronic hypoxemia.   -Case discussed with medical oncologist and plan for now is to obtain tissue via liver biopsy -We will be available for bronchoscopic evaluation and lung biopsy if necessary in the future.  -Additional imaging via PET scan is planned on outpatient   Chronic hypoxemia with acute left lung consolidated infiltrate and postobstructive pneumonia    -I would favor discontinuing resuscitative fluids at 125 mL/h due to underlying CHF status post CHF exacerbation with hospitalization. -Patient is also on torsemide once daily which I think may be helpful due to clinical signs of edema with crackles at the bases bilaterally on auscultation -Patient has elevated WBC count possibly due to  pneumonia although she has diarrhea and C. difficile and GI panel are in process.  I would start Flagyl empirically for now as this may cover certain species causing pneumonia as well.  -Due to pneumonia coupled with diarrhea will obtain testing for Legionella via serology and urinary antigen -Procalcitonin trending May not be too useful in this case due to underlying CKD -MRSA PCR and respiratory viral panel    Innumerable  liver lesions Patient is in process of being seen by oncology and there is plan for liver biopsy  -suspect metastatic implants     OHS with Pickwikian syndrome   BIPAP qhs  Patient on trilogy at home   Thank you for allowing me to participate in the care of this patient.   Patient/Family are satisfied with care plan and all questions have been answered.    Provider disclosure: Patient with at least one acute or chronic illness or injury that poses a threat to life or bodily function and is being managed actively during this encounter.  All of the below services have been performed independently by signing provider:  review of prior documentation from internal and or external health records.  Review of previous and current lab results.  Interview and comprehensive assessment during patient visit today. Review of current and previous chest radiographs/CT scans. Discussion of management and test interpretation with health care team and patient/family.   This document was prepared using Dragon voice recognition software and may include unintentional dictation errors.     Ottie Glazier, M.D.  Division of Pulmonary & Critical Care Medicine

## 2021-11-11 NOTE — Progress Notes (Signed)
   11/11/21 1200  Clinical Encounter Type  Visited With Patient and family together  Visit Type Initial  Referral From Chaplain  Consult/Referral To [place] [facility] responded to request from previous Chaplain to inquire about Advance Directive. Family is still working through and discussing components of AD and not ready to sigh as of yet. Chaplain provided support to family as they walk through this process.

## 2021-11-12 ENCOUNTER — Inpatient Hospital Stay: Payer: Medicare PPO | Admitting: Radiology

## 2021-11-12 DIAGNOSIS — R918 Other nonspecific abnormal finding of lung field: Secondary | ICD-10-CM | POA: Diagnosis not present

## 2021-11-12 DIAGNOSIS — S22080K Wedge compression fracture of T11-T12 vertebra, subsequent encounter for fracture with nonunion: Secondary | ICD-10-CM | POA: Diagnosis not present

## 2021-11-12 DIAGNOSIS — C50412 Malignant neoplasm of upper-outer quadrant of left female breast: Secondary | ICD-10-CM | POA: Diagnosis not present

## 2021-11-12 HISTORY — PX: IR KYPHO THORACIC WITH BONE BIOPSY: IMG5518

## 2021-11-12 HISTORY — PX: IR US GUIDE BX ASP/DRAIN: IMG2392

## 2021-11-12 HISTORY — PX: IR BONE TUMOR(S)RF ABLATION: IMG2284

## 2021-11-12 LAB — PROTIME-INR
INR: 1 (ref 0.8–1.2)
Prothrombin Time: 13 seconds (ref 11.4–15.2)

## 2021-11-12 LAB — CBC
HCT: 48.7 % — ABNORMAL HIGH (ref 36.0–46.0)
Hemoglobin: 15.1 g/dL — ABNORMAL HIGH (ref 12.0–15.0)
MCH: 30.2 pg (ref 26.0–34.0)
MCHC: 31 g/dL (ref 30.0–36.0)
MCV: 97.4 fL (ref 80.0–100.0)
Platelets: 193 10*3/uL (ref 150–400)
RBC: 5 MIL/uL (ref 3.87–5.11)
RDW: 12.9 % (ref 11.5–15.5)
WBC: 15.6 10*3/uL — ABNORMAL HIGH (ref 4.0–10.5)
nRBC: 0 % (ref 0.0–0.2)

## 2021-11-12 LAB — BASIC METABOLIC PANEL
Anion gap: 13 (ref 5–15)
BUN: 19 mg/dL (ref 8–23)
CO2: 36 mmol/L — ABNORMAL HIGH (ref 22–32)
Calcium: 10.1 mg/dL (ref 8.9–10.3)
Chloride: 89 mmol/L — ABNORMAL LOW (ref 98–111)
Creatinine, Ser: 1.1 mg/dL — ABNORMAL HIGH (ref 0.44–1.00)
GFR, Estimated: 54 mL/min — ABNORMAL LOW (ref >60)
Glucose, Bld: 116 mg/dL — ABNORMAL HIGH (ref 70–99)
Potassium: 3.8 mmol/L (ref 3.5–5.1)
Sodium: 138 mmol/L (ref 135–145)

## 2021-11-12 MED ORDER — MIDAZOLAM HCL 2 MG/2ML IJ SOLN
INTRAMUSCULAR | Status: AC | PRN
  Administered 2021-11-12 (×3): 1 mg via INTRAVENOUS

## 2021-11-12 MED ORDER — SODIUM CHLORIDE 0.9 % IV SOLN
INTRAVENOUS | Status: AC | PRN
  Administered 2021-11-12: 10 mL/h via INTRAVENOUS

## 2021-11-12 MED ORDER — FENTANYL CITRATE (PF) 250 MCG/5ML IJ SOLN
INTRAMUSCULAR | Status: AC
  Filled 2021-11-12: qty 5

## 2021-11-12 MED ORDER — MIDAZOLAM HCL 2 MG/2ML IJ SOLN
INTRAMUSCULAR | Status: AC
Start: 2021-11-12 — End: 2021-11-13
  Filled 2021-11-12: qty 6

## 2021-11-12 MED ORDER — LIDOCAINE HCL (PF) 1 % IJ SOLN
INTRAMUSCULAR | Status: AC
  Filled 2021-11-12: qty 30

## 2021-11-12 MED ORDER — FENTANYL CITRATE (PF) 100 MCG/2ML IJ SOLN
INTRAMUSCULAR | Status: AC | PRN
  Administered 2021-11-12 (×2): 50 ug via INTRAVENOUS
  Administered 2021-11-12 (×2): 25 ug via INTRAVENOUS

## 2021-11-12 NOTE — Hospital Course (Addendum)
71 year old female with past medical history of breast cancer, GERD, arthritis, chronic diastolic congestive heart failure, chronic kidney disease, depression, hyperlipidemia, morbid obesity, peripheral vascular disease presented to the hospital with back pain.  On imaging studies she was diagnosed with a T12 compression fracture.  She was also found to have a mass of the lung with liver metastases, adrenal metastases and osseous metastases.  The patient was seen in consultation by oncology, interventional radiology, neurosurgery and pulmonary.  Kyphoplasty delayed until insurance authorization was received.  Liver biopsy and kyphoplasty done on 11/12/2021.  Palliative care consulted 5/31 at request of patient's daughter.  Initially PT/OT were recommending home health, but due to progressive weakness, now recommend SNF.  TOC following.   Hospital course complicated by UTI.  Started on Rocephin, cultures later grew Pseudomonas, changed antibiotic to Cefepime on 6/4.

## 2021-11-12 NOTE — Progress Notes (Signed)
PT Cancellation Note  Patient Details Name: Daisy [name] MRN: [id number] DOB: 10-15-1950   Cancelled Treatment:    Reason Eval/Treat Not Completed: Patient at procedure or test/unavailable (Chartreviewed, treatment attempted. Pt off floor for kyphoplasty and biopsy. Pt will need new PT order (or continue upon transfer) to continue our services. Pt will need updated orders for back precautions and back bracing.)  3:15 PM, 11/12/21 Etta Grandchild, PT, DPT Physical Therapist - [facility]  [phone] ([place])    [place] 11/12/2021, 3:15 PM

## 2021-11-12 NOTE — Progress Notes (Signed)
Progress Note   Patient: Daisy [name] GOT:[id number] DOB: [birth year] DOA: 11/05/2021     6 DOS: the patient was seen and examined on 11/12/2021   Brief hospital course: 71 year old female with past medical history of breast cancer, GERD, arthritis, chronic diastolic congestive heart failure, chronic kidney disease, depression, hyperlipidemia, morbid obesity, peripheral vascular disease presented to the hospital with back pain.  On imaging studies she was diagnosed with a T12 compression fracture.  She was also found to have a mass of the lung with liver metastases, adrenal metastases and osseous metastases.  The patient was seen in consultation by oncology, interventional radiology, neurosurgery and pulmonary.  We needed insurance authorization for kyphoplasty.  Liver biopsy and kyphoplasty done on 11/12/2021.  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  Interventional radiology performed a kyphoplasty today.  Holding aspirin and Pletal for now.  Can likely restart aspirin and Pletal tomorrow and also Lovenox injections.  Mass of lower lobe of left lung Patient has a mass of the lung, liver, adrenals and osseous metastases.  Patient does have a history of breast cancer.  Liver biopsy today.  Seen by oncology.  Malignant neoplasm of upper-outer quadrant of left breast in female, estrogen receptor positive ([place]) Patient with a known history of left breast cancer status post lumpectomy and radiation treatment. Completed a 5 year course of Anastrazole  Morbid obesity with BMI of 50.0-59.9, adult ([place]) Patient is morbidly obese with a BMI of 49.17 kg/m2   Obesity hypoventilation syndrome (HCC) Chronic 2 L of oxygen and BiPAP at night  Generalized anxiety disorder Continue fluoxetine  Essential hypertension Continue metoprolol  COPD with chronic bronchitis and emphysema (HCC) Stable and not acutely exacerbated Continue as  needed bronchodilator therapy  Chronic diastolic heart failure (HCC) Stable and not acutely exacerbated.  Continue metoprolol and torsemide.  With CO2 rising cut back twice a day torsemide to once a day.  And CO2 came down a little bit.  Acute kidney injury superimposed on CKD ([place]) Acute kidney injury on CKD stage IIIa.  Creatinine improved from 1.48 down to 1.17 even with diuresis with torsemide.  Creatinine up to 1.10 today.  Cut back torsemide from twice a day dosing to once a day.        Subjective: Patient seen this morning before kyphoplasty and liver biopsy.  She was a little nervous about things but wanting to get things over with.  Admitted with back pain found to have a T12 compression fracture and also masses in the lung, adrenals liver and osseous metastases.  Physical Exam: Vitals:   11/12/21 1515 11/12/21 1530 11/12/21 1545 11/12/21 1600  BP: 112/60 126/81 129/62 130/68  Pulse: 80 80 77 74  Resp: (!) 22 19 (!) 23 19  Temp:      TempSrc:      SpO2: 97% 98% 97% 95%  Weight:      Height:       Physical Exam HENT:     Head: Normocephalic.     Mouth/Throat:     Pharynx: No oropharyngeal exudate.  Eyes:     General: Lids are normal.     Conjunctiva/sclera: Conjunctivae normal.  Cardiovascular:     Rate and Rhythm: Normal rate and regular rhythm.     Heart sounds: Normal heart sounds, S1 normal and S2 normal.  Pulmonary:     Breath sounds: Examination of the right-lower field reveals decreased breath sounds. Examination of the left-lower field  reveals decreased breath sounds. Decreased breath sounds present. No wheezing, rhonchi or rales.  Abdominal:     Palpations: Abdomen is soft.     Tenderness: There is no abdominal tenderness.  Musculoskeletal:     Right lower leg: Swelling present.     Left lower leg: Swelling present.  Skin:    General: Skin is warm.     Findings: No rash.  Neurological:     Mental Status: She is alert and oriented to person, place,  and time.    Data Reviewed: CO2 down to 36 from 41.  Creatinine down to 1.11.  White blood cell count 15.6, hemoglobin 15.1   Disposition: Status is: Inpatient Remains inpatient appropriate because: Kyphoplasty and liver biopsy today.  Planned Discharge Destination: Home with Home Health   Author: Loletha Grayer, MD 11/12/2021 4:29 PM  For on call review [email or web address].

## 2021-11-12 NOTE — Progress Notes (Signed)
PULMONOLOGY         Date: 11/12/2021,   MRN# [id number] Daisy [name] 1950-12-14     AdmissionWeight: (!) 144.7 kg                 CurrentWeight: (!) 142.4 kg  Referring provider: Dr. Francine Graven   CHIEF COMPLAINT:   Left lower lobe consolidated infiltrate with postobstructive mass   HISTORY OF PRESENT ILLNESS   This is a pleasant 71 year old female with a history of gastroesophageal reflux, CHF, AKI, osteoarthritis, previous breast cancer, cholecystitis, major depression disorder, chronic dyspnea, dyslipidemia, atrial fibrillation, morbid obesity, prediabetes, recurrent UTIs, who came into the hospital with complaints of abdominal pain and diarrhea.  Notably she was recently discharged with CHF exacerbation and continues to have chronic dyspnea.  She also complains of worsening lower back pain.  She had imaging of her spine which showed T12 compression fracture that appears to be acute.  There was an incidental finding of lung mass.  Additional imaging was performed and I reviewed CT scans independently with findings of left lower lobe consolidated infiltrate with mass as well as multiple liver lesions suggestive of distal metastasis.  She has had neurosurgical evaluation with possible kyphoplasty in future.  She had medical oncology evaluation and there is discussion about liver biopsy.  Pulmonary consultation for evaluation management of left lung pneumonia with mass.   11/12/21- patient used BIPAP but interface was different then home mask. She has side straps rather then overhead straps which she shares was uncomfortable. She had US liver biopsy today and is not complaining of pain.  She is stable from pulmonary standpoint.   PAST MEDICAL HISTORY   Past Medical History:  Diagnosis Date   Acid reflux    Acute heart failure ([place]) 10/17/2019   AKI (acute kidney injury) ([place]) 10/17/2019   Arthritis    Breast cancer of upper-outer quadrant of right female breast ([place])  11/19/2016   Chicken pox    Cholecystitis    Chronic diastolic heart failure ([place]) 01/04/2020   CKD (chronic kidney disease), stage III ([place]) 01/04/2020   Depression    Dyspnea    History of radiation therapy 01/20/17-03/02/17   left breast was treated to 50.4 Gy in 18 fractions   HLD (hyperlipidemia) 01/04/2020   Hyperlipemia    Irregular heart rate    Malignant neoplasm of upper-outer quadrant of left breast in female, estrogen receptor positive ([place]) 09/30/2016   Morbid obesity with BMI of 50.0-59.9, adult ([place]) 01/04/2020   Obesity    Peripheral vascular disease ([place])    Pneumonia    Prediabetes 01/04/2020   Urinary tract infection    Urinary tract infection with hematuria      SURGICAL HISTORY   Past Surgical History:  Procedure Laterality Date   ABDOMINAL HYSTERECTOMY     BREAST LUMPECTOMY Left 11/19/2016   BREAST LUMPECTOMY WITH RADIOACTIVE SEED AND SENTINEL LYMPH NODE BIOPSY (Left)   BREAST LUMPECTOMY WITH RADIOACTIVE SEED AND SENTINEL LYMPH NODE BIOPSY Left 11/19/2016   Procedure: BREAST LUMPECTOMY WITH RADIOACTIVE SEED AND SENTINEL LYMPH NODE BIOPSY;  Surgeon: Rolm Bookbinder, MD;  Location: [facility];  Service: General;  Laterality: Left;   CESAREAN SECTION     CESAREAN SECTION     x 2   CHOLECYSTECTOMY  2007   IR BONE TUMOR(S)RF ABLATION  11/12/2021   IR KYPHO THORACIC WITH BONE BIOPSY  11/12/2021   IR US GUIDE BX ASP/DRAIN  11/12/2021   KNEE ARTHROSCOPY  bilateral   KNEE SURGERY Bilateral    LOWER EXTREMITY ANGIOGRAM N/A 04/04/2014   Procedure: LOWER EXTREMITY ANGIOGRAM;  Surgeon: Laverda Page, MD;  Location: [facility] C[facility];  Service: Cardiovascular;  Laterality: N/A;   OVARIAN CYST REMOVAL     RE-EXCISION OF BREAST LUMPECTOMY Left 12/12/2016   Procedure: RE-EXCISION OF LEFT BREAST LUMPECTOMY;  Surgeon: Rolm Bookbinder, MD;  Location: [facility];  Service: General;  Laterality: Left;   ROTATOR CUFF REPAIR Right    TONSILLECTOMY     WRIST SURGERY Bilateral       FAMILY HISTORY   Family History  Problem Relation Age of Onset   Heart disease Mother    Lung cancer Father    Heart disease Father    Heart disease Brother      SOCIAL HISTORY   Social History   Tobacco Use   Smoking status: Some Days    Packs/day: 1.00    Years: 30.00    Pack years: 30.00    Types: E-cigarettes, Cigarettes    Last attempt to quit: 11/02/2019    Years since quitting: 2.0   Smokeless tobacco: Never  Vaping Use   Vaping Use: Never used  Substance Use Topics   Alcohol use: No   Drug use: No     MEDICATIONS    Home Medication:    Current Medication:  Current Facility-Administered Medications:    0.9 %  sodium chloride infusion, 250 mL, Intravenous, PRN, Agbata, Tochukwu, MD   acetaminophen (TYLENOL) tablet 650 mg, 650 mg, Oral, Q4H PRN, Agbata, Tochukwu, MD   cholecalciferol (VITAMIN D3) tablet 1,000 Units, 1,000 Units, Oral, Daily, Agbata, Tochukwu, MD, 1,000 Units at 11/11/21 0816   cyclobenzaprine (FLEXERIL) tablet 5 mg, 5 mg, Oral, TID PRN, Agbata, Tochukwu, MD, 5 mg at 11/10/21 2033   ezetimibe (ZETIA) tablet 10 mg, 10 mg, Oral, QHS, Wieting, Richard, MD, 10 mg at 11/11/21 2048   FLUoxetine (PROZAC) capsule 20 mg, 20 mg, Oral, Daily, Agbata, Tochukwu, MD, 20 mg at 11/12/21 1028   HYDROcodone-acetaminophen (NORCO/VICODIN) 5-325 MG per tablet 1 tablet, 1 tablet, Oral, Q4H PRN, Agbata, Tochukwu, MD, 1 tablet at 11/12/21 1028   ipratropium-albuterol (DUONEB) 0.5-2.5 (3) MG/3ML nebulizer solution 3 mL, 3 mL, Nebulization, Q6H PRN, Agbata, Tochukwu, MD   lidocaine (PF) (XYLOCAINE) 1 % injection, , , ,    LORazepam (ATIVAN) tablet 0.5 mg, 0.5 mg, Oral, Q4H PRN, Merlyn Lot, MD, 0.5 mg at 11/06/21 1342   metoprolol succinate (TOPROL-XL) 24 hr tablet 12.5 mg, 12.5 mg, Oral, Daily, Agbata, Tochukwu, MD, 12.5 mg at 11/12/21 1028   ondansetron (ZOFRAN) tablet 4 mg, 4 mg, Oral, Q6H PRN **OR** ondansetron (ZOFRAN) injection 4 mg, 4 mg, Intravenous,  Q6H PRN, Agbata, Tochukwu, MD   pantoprazole (PROTONIX) EC tablet 20 mg, 20 mg, Oral, Daily, Agbata, Tochukwu, MD, 20 mg at 11/11/21 0816   potassium chloride SA (KLOR-CON M) CR tablet 20 mEq, 20 mEq, Oral, BID, Wieting, Richard, MD, 20 mEq at 11/11/21 2049   sodium chloride flush (NS) 0.9 % injection 3 mL, 3 mL, Intravenous, Q12H, Agbata, Tochukwu, MD, 3 mL at 11/11/21 2049   sodium chloride flush (NS) 0.9 % injection 3 mL, 3 mL, Intravenous, PRN, Agbata, Tochukwu, MD   torsemide (DEMADEX) tablet 20 mg, 20 mg, Oral, Daily, Wieting, Richard, MD, 20 mg at 11/12/21 1029   vitamin B-12 (CYANOCOBALAMIN) tablet 1,000 mcg, 1,000 mcg, Oral, Daily, Agbata, Tochukwu, MD, 1,000 mcg at 11/11/21 0816    ALLERGIES   Elemental  sulfur, Levaquin [levofloxacin], and Statins     REVIEW OF SYSTEMS    Review of Systems:  Gen:  Denies  fever, sweats, chills weigh loss  HEENT: Denies blurred vision, double vision, ear pain, eye pain, hearing loss, nose bleeds, sore throat Cardiac:  No dizziness, chest pain or heaviness, chest tightness,edema Resp:   reports dyspnea chronically  Gi: Denies swallowing difficulty, stomach pain, nausea or vomiting, diarrhea, constipation, bowel incontinence Gu:  Denies bladder incontinence, burning urine Ext:   Denies Joint pain, stiffness or swelling Skin: Denies  skin rash, easy bruising or bleeding or hives Endoc:  Denies polyuria, polydipsia , polyphagia or weight change Psych:   Denies depression, insomnia or hallucinations   Other:  All other systems negative   VS: BP 121/66 (BP Location: Right Arm)   Pulse 84   Temp 97.9 F (36.6 C)   Resp 17   Ht 5\' 7"  (1.702 m)   Wt (!) 142.4 kg   SpO2 95%   BMI 49.17 kg/m      PHYSICAL EXAM    GENERAL:NAD, no fevers, chills, no weakness no fatigue HEAD: Normocephalic, atraumatic.  EYES: Pupils equal, round, reactive to light. Extraocular muscles intact. No scleral icterus.  MOUTH: Moist mucosal membrane.  Dentition intact. No abscess noted.  EAR, NOSE, THROAT: Clear without exudates. No external lesions.  NECK: Supple. No thyromegaly. No nodules. No JVD.  PULMONARY: decreased breath sounds with mild rhonchi worse at bases bilaterally.  CARDIOVASCULAR: S1 and S2. Regular rate and rhythm. No murmurs, rubs, or gallops. No edema. Pedal pulses 2+ bilaterally.  GASTROINTESTINAL: Soft, nontender, nondistended. No masses. Positive bowel sounds. No hepatosplenomegaly.  MUSCULOSKELETAL: No swelling, clubbing, or edema. Range of motion full in all extremities.  NEUROLOGIC: Cranial nerves II through XII are intact. No gross focal neurological deficits. Sensation intact. Reflexes intact.  SKIN: No ulceration, lesions, rashes, or cyanosis. Skin warm and dry. Turgor intact.  PSYCHIATRIC: Mood, affect within normal limits. The patient is awake, alert and oriented x 3. Insight, judgment intact.       IMAGING   Reviewed CT chest findings of left lower lobe mass and surrounding pneumonia  ASSESSMENT/PLAN   Left lung consolidated infiltrate with lung mass   -Patient with at least moderate pretest probability of malignancy due to previous breast cancer as well as history of COPD and chronic hypoxemia.   -Case discussed with medical oncologist and plan for now is to obtain tissue via liver biopsy -We will be available for bronchoscopic evaluation and lung biopsy if necessary in the future.  -Additional imaging via PET scan is planned on outpatient   Chronic hypoxemia with acute left lung consolidated infiltrate and postobstructive pneumonia    -I would favor discontinuing resuscitative fluids at 125 mL/h due to underlying CHF status post CHF exacerbation with hospitalization. -Patient is also on torsemide once daily which I think may be helpful due to clinical signs of edema with crackles at the bases bilaterally on auscultation -Patient has elevated WBC count possibly due to pneumonia although she has  diarrhea and C. difficile and GI panel are in process.  I would start Flagyl empirically for now as this may cover certain species causing pneumonia as well.  -Due to pneumonia coupled with diarrhea will obtain testing for Legionella via serology and urinary antigen -Procalcitonin trending May not be too useful in this case due to underlying CKD -MRSA PCR and respiratory viral panel    Innumerable liver lesions Patient is in process of being  seen by oncology and there is plan for liver biopsy  -suspect metastatic implants     OHS with Pickwikian syndrome   BIPAP qhs  Patient on trilogy at home   Thank you for allowing me to participate in the care of this patient.   Patient/Family are satisfied with care plan and all questions have been answered.    Provider disclosure: Patient with at least one acute or chronic illness or injury that poses a threat to life or bodily function and is being managed actively during this encounter.  All of the below services have been performed independently by signing provider:  review of prior documentation from internal and or external health records.  Review of previous and current lab results.  Interview and comprehensive assessment during patient visit today. Review of current and previous chest radiographs/CT scans. Discussion of management and test interpretation with health care team and patient/family.   This document was prepared using Dragon voice recognition software and may include unintentional dictation errors.     Ottie Glazier, M.D.  Division of Pulmonary & Critical Care Medicine

## 2021-11-12 NOTE — Progress Notes (Signed)
OT Cancellation Note  Patient Details Name: Daisy [name] MRN: [id number] DOB: 06-May-1951   Cancelled Treatment:    Reason Eval/Treat Not Completed: Other (comment) Per chart review, pt is scheduled for kyphoplasty and liver biopsy today. OT will re-assess tomorrow after surgery.   [name], MS, OTR/L , CBIS ascom [phone]  11/12/21, 9:15 AM

## 2021-11-12 NOTE — Progress Notes (Signed)
   11/12/21 1500  Clinical Encounter Type  Visited With Family  Visit Type Follow-up  Spiritual Encounters  Spiritual Needs Grief support;Prayer   Patient not present. Chaplain visited with daughters as they process the sudden critical changes in their mother's condition.

## 2021-11-12 NOTE — Care Management Important Message (Signed)
Important Message  Patient Details  Name: Daisy [name] MRN: [id number] Date of Birth: [birth year]   Medicare Important Message Given:  Yes     [name] 11/12/2021, 10:28 AM

## 2021-11-12 NOTE — Consult Note (Addendum)
Post-IR Procedure Consult - Anticoagulant / Antiplatelet PTA / Inpatient Med List  Procedures: Combined bone biopsy, RFA and kyphoplasty and liver mass biopsy  Bleeding risk: Standard  Recommendations: Consider re-starting DVT prophylaxis with LMWH adjusted for body habitus 5/31 AM. May also re-start aspirin and cilostazol if ongoing therapy indicated  Benita Gutter  11/12/21

## 2021-11-12 NOTE — Procedures (Signed)
Interventional Radiology Procedure Note  Procedure:  1.) US guided core biopsy of liver mass 2.) T12 OsteoCool (combined bone biopsy, RFA and kyphoplasty)  Complications: None  Estimated Blood Loss: None  Recommendations: - Path sent - Return to room   Signed,  Criselda Peaches, MD

## 2021-11-13 DIAGNOSIS — S22080K Wedge compression fracture of T11-T12 vertebra, subsequent encounter for fracture with nonunion: Secondary | ICD-10-CM | POA: Diagnosis not present

## 2021-11-13 LAB — BLOOD GAS, VENOUS
Acid-Base Excess: 14 mmol/L — ABNORMAL HIGH (ref 0.0–2.0)
Bicarbonate: 40.5 mmol/L — ABNORMAL HIGH (ref 20.0–28.0)
O2 Saturation: 97.3 %
Patient temperature: 37
pCO2, Ven: 57 mmHg (ref 44–60)
pH, Ven: 7.46 — ABNORMAL HIGH (ref 7.25–7.43)
pO2, Ven: 86 mmHg — ABNORMAL HIGH (ref 32–45)

## 2021-11-13 LAB — CBC
HCT: 52.5 % — ABNORMAL HIGH (ref 36.0–46.0)
Hemoglobin: 16.1 g/dL — ABNORMAL HIGH (ref 12.0–15.0)
MCH: 30.6 pg (ref 26.0–34.0)
MCHC: 30.7 g/dL (ref 30.0–36.0)
MCV: 99.6 fL (ref 80.0–100.0)
Platelets: 185 10*3/uL (ref 150–400)
RBC: 5.27 MIL/uL — ABNORMAL HIGH (ref 3.87–5.11)
RDW: 13 % (ref 11.5–15.5)
WBC: 16.7 10*3/uL — ABNORMAL HIGH (ref 4.0–10.5)
nRBC: 0 % (ref 0.0–0.2)

## 2021-11-13 LAB — BASIC METABOLIC PANEL
Anion gap: 12 (ref 5–15)
BUN: 27 mg/dL — ABNORMAL HIGH (ref 8–23)
CO2: 38 mmol/L — ABNORMAL HIGH (ref 22–32)
Calcium: 10.2 mg/dL (ref 8.9–10.3)
Chloride: 90 mmol/L — ABNORMAL LOW (ref 98–111)
Creatinine, Ser: 1.59 mg/dL — ABNORMAL HIGH (ref 0.44–1.00)
GFR, Estimated: 35 mL/min — ABNORMAL LOW (ref >60)
Glucose, Bld: 117 mg/dL — ABNORMAL HIGH (ref 70–99)
Potassium: 4.2 mmol/L (ref 3.5–5.1)
Sodium: 140 mmol/L (ref 135–145)

## 2021-11-13 MED ORDER — ENOXAPARIN SODIUM 80 MG/0.8ML IJ SOSY
70.0000 mg | PREFILLED_SYRINGE | INTRAMUSCULAR | Status: DC
  Administered 2021-11-13 – 2021-11-19 (×7): 70 mg via SUBCUTANEOUS
  Filled 2021-11-13 (×7): qty 0.7

## 2021-11-13 MED ORDER — ASPIRIN 81 MG PO CHEW
81.0000 mg | CHEWABLE_TABLET | Freq: Every day | ORAL | Status: DC
Start: 2021-11-13 — End: 2021-11-19
  Administered 2021-11-13 – 2021-11-19 (×7): 81 mg via ORAL
  Filled 2021-11-13 (×7): qty 1

## 2021-11-13 MED ORDER — LOPERAMIDE HCL 2 MG PO CAPS: 2.0000 mg | ORAL_CAPSULE | ORAL | Status: DC | PRN

## 2021-11-13 MED ORDER — CILOSTAZOL 100 MG PO TABS
50.0000 mg | ORAL_TABLET | Freq: Two times a day (BID) | ORAL | Status: DC
  Administered 2021-11-13 – 2021-11-19 (×13): 50 mg via ORAL
  Filled 2021-11-13 (×13): qty 0.5

## 2021-11-13 NOTE — Progress Notes (Signed)
Referring Physician(s): Meade Maw, MD  Supervising Physician: Jacqulynn Cadet  Patient Status:  Daisy [name] - In-pt  Reason for visit: T12 pathologic fracture and associated back pain and multiple metastases throughout the body.  S/p T12 bone biopsy followed by osteocool RFA and KP and liver mass biopsy with IR on 5/30  Subjective: Patient does complain of ongoing back pain but feels it is different than pre-procedure, feels it may be more muscular. She denies any other complaints at procedure site.  Allergies: Elemental sulfur, Levaquin [levofloxacin], and Statins  Medications: Prior to Admission medications   Medication Sig Start Date End Date Taking? Authorizing Provider  acetaminophen (TYLENOL) 325 MG tablet Take 2 tablets (650 mg total) by mouth every 4 (four) hours as needed for headache or mild pain. 11/02/19  Yes Guilford Shi, MD  albuterol (VENTOLIN HFA) 108 (90 Base) MCG/ACT inhaler TAKE 2 PUFFS BY MOUTH EVERY 6 HOURS AS NEEDED FOR WHEEZE OR SHORTNESS OF BREATH Patient taking differently: Inhale 2 puffs into the lungs every 6 (six) hours as needed for wheezing or shortness of breath. 08/06/21  Yes Lesleigh Noe, MD  aspirin 81 MG chewable tablet Chew 81 mg by mouth daily.   Yes [provider]  cholecalciferol (VITAMIN D3) 25 MCG (1000 UNIT) tablet Take 1,000 Units by mouth daily.   Yes [provider]  cilostazol (PLETAL) 50 MG tablet Take 1 tablet (50 mg total) by mouth 2 (two) times daily. 11/01/21  Yes Lesleigh Noe, MD  cyanocobalamin 1000 MCG tablet Take 1,000 mcg by mouth daily.   Yes [provider]  dapagliflozin propanediol (FARXIGA) 5 MG TABS tablet Take 5 mg by mouth daily.   Yes [provider]  FLUoxetine (PROZAC) 20 MG capsule TAKE 1 CAPSULE BY MOUTH EVERY DAY Patient taking differently: Take 20 mg by mouth daily. 08/06/21  Yes Lesleigh Noe, MD  KLOR-CON M20 20 MEQ tablet TAKE 1 TABLET BY MOUTH EVERY  DAY Patient taking differently: Take 20 mEq by mouth daily. 05/23/21  Yes Lesleigh Noe, MD  metoprolol succinate (TOPROL-XL) 25 MG 24 hr tablet Take 0.5 tablets (12.5 mg total) by mouth daily. 03/20/21  Yes Lesleigh Noe, MD  NEXLIZET 180-10 MG TABS TAKE 1 TABLET BY MOUTH EVERY DAY Patient taking differently: Take 1 tablet by mouth at bedtime. 04/08/21  Yes Adrian Prows, MD  pantoprazole (PROTONIX) 20 MG tablet Take 1 tablet (20 mg total) by mouth daily. 03/20/21  Yes Lesleigh Noe, MD  torsemide (DEMADEX) 10 MG tablet Take 2 tablets (20 mg total) by mouth daily. 10/29/21  Yes Domenic Polite, MD  predniSONE (DELTASONE) 20 MG tablet Take 2 tablets (40mg ) daily for 2 days then 1 tablet (20mg ) daily for 2 days then STOP Patient not taking: Reported on 11/06/2021 10/29/21   Domenic Polite, MD   Vital Signs: BP 122/69 (BP Location: Right Arm)   Pulse (!) 104   Temp 97.9 F (36.6 C)   Resp 17   Ht 5\' 7"  (1.702 m)   Wt (!) 341 lb 4.4 oz (154.8 kg)   SpO2 94%   BMI 53.45 kg/m   Physical Exam General: A&O, sitting up in bed doing PT Back: dressing sites C/D/I, minimal TTP  Imaging: IR Bone Tumor(s)RF Ablation  Result Date: 11/12/2021 CLINICAL DATA:  71 year old female with pathologic symptomatic T12 compression fracture. She presents for transpedicular bone biopsy, radiofrequency ablation and cement augmentation with balloon kyphoplasty. EXAM: FLUOROSCOPIC GUIDED T12 VERTEBRAL BODY BIOPSY RF ABLATION  AND KYPHOPLASTY/CEMENT AUGMENTATION. COMPARISON:  None Available. MEDICATIONS: Ancef 2 g IV; The antibiotic was administered in an appropriate time interval prior to needle puncture of the skin. ANESTHESIA/SEDATION: Versed 3 mg IV; Fentanyl 150 mcg IV Moderate Sedation Time: 75 minutes; The patient was continuously monitored during the procedure by the interventional radiology nurse under my direct supervision. FLUOROSCOPY TIME:  Radiation exposure index: 167.5 mGy reference air kerma COMPLICATIONS:  None immediate. TECHNIQUE: Informed written consent was obtained from the patient after a thorough discussion of the procedural risks, benefits and alternatives. All questions were addressed. Maximal Sterile Barrier Technique was utilized including caps, mask, sterile gowns, sterile gloves, sterile drape, hand hygiene and skin antiseptic. A timeout was performed prior to the initiation of the procedure. The patient was placed prone on the fluoroscopic table. The skin overlying the thoracic region was then prepped and draped in the usual sterile fashion. Maximal barrier sterile technique was utilized including caps, mask, sterile gowns, sterile gloves, sterile drape, hand hygiene and skin antiseptic. Intravenous Fentanyl and Versed were administered as conscious sedation during continuous cardiorespiratory monitoring by the radiology RN. The left pedicle at T12 was then infiltrated with 1% lidocaine followed by the advancement of a Kyphon trocar needle through the left pedicle into the posterior one-third of the vertebral body. A biopsy specimen was then obtained. Subsequently, the osteo drill was advanced to the anterior third of the vertebral body. The osteo drill was retracted. Through the working cannula, a 15 mm OsteoCool RF ablation probe was inserted and positioned under fluoroscopic guidance. In similar fashion, the right T12 pedicle was infiltrated with 1% lidocaine. Utilizing a extra pedicular approach, a second Kyphon trocar needle was advanced into the posterior third of the vertebral body. A biopsy specimen was then obtained. Subsequently, the osteo drill was coaxially advanced to the anterior right third. The osteo drill was exchanged for a 15 mm OsteoCool RF ablation probe which was positioned under fluoroscopic guidance. With both OsteoCool ablation probes in place, the ablation was performed for 11 minutes. Attention was now paid towards the kyphoplasty portion of the procedure. Beginning at the T12  vertebral body level, a Kyphon inflatable bone tamp 15 x 2.5 was advanced through both working cannulas and positioned with the distal marker approximately 5 mm from the anterior aspect of the cortex. Appropriate positioning was confirmed on the AP projection. At this time, the balloon was expanded using contrast via a Kyphon inflation syringe device via micro tubing. Inflations were continued under direct fluoroscopic guidance. At this time, methylmethacrylate mixture was reconstituted in the Kyphon bone mixing device system. This was then loaded into the delivery mechanism, attached to Kyphon bone fillers. The balloons were deflated and removed followed by the instillation of methylmethacrylate mixture with excellent filling in the AP and lateral projections. The working cannulae and the bone filler were then retrieved and removed. Multiple spot radiographic images were obtained in various obliquities. Hemostasis was achieved with manual compression. The patient tolerated the procedure well without immediate postprocedural complication. FINDINGS: Completion images demonstrate a technically excellent result with adequate cement filling of the T12 vertebral body on both the AP and lateral projections. No extravasation was noted in the disk spaces or posteriorly into the spinal canal. No epidural venous contamination was seen. IMPRESSION: Technically successful T12 vertebral body biopsy, ablation and cement augmentation using balloon kyphoplasty. PLAN: The patient will be seen for clinical follow-up at the interventional radiology clinic in 2-4 weeks. Signed, Criselda Peaches, MD [facility] Electronically Signed   By: Jacqulynn Cadet M.D.   On: 11/12/2021 15:57   IR US Guide Bx Asp/Drain  Result Date: 11/12/2021 INDICATION: 71 year old female with lung mass and advanced metastatic disease including multiple liver lesions. Findings are concerning for  possible primary bronchogenic carcinoma with metastatic disease. Patient presents for ultrasound-guided core biopsy of liver mass to establish tissue diagnosis as well as osteo cool procedure of pathologic fracture at T12. EXAM: Ultrasound-guided core biopsy of liver mass MEDICATIONS: None. ANESTHESIA/SEDATION: Total sedation time reported in kyphoplasty procedure. FLUOROSCOPY TIME:  None. COMPLICATIONS: None immediate. PROCEDURE: Informed written consent was obtained from the patient after a thorough discussion of the procedural risks, benefits and alternatives. All questions were addressed. Maximal Sterile Barrier Technique was utilized including caps, mask, sterile gowns, sterile gloves, sterile drape, hand hygiene and skin antiseptic. A timeout was performed prior to the initiation of the procedure. Ultrasound was used to interrogate the liver. Multiple hypoechoic masses are identified. A suitable mass was selected for biopsy. The skin was sterilely prepped and draped in the standard fashion using chlorhexidine skin prep. Local anesthesia was attained by infiltration with 1% lidocaine. A small dermatotomy was made. An 18 gauge trocar needle was then advanced through the skin, into the liver and positioned at the margin of the liver mass. Multiple 18 gauge core biopsies were then obtained coaxially using the bio Pince automated biopsy device. Biopsy specimens were placed in formalin and delivered to pathology for further analysis. As the introducer needle was removed, the biopsy tract was embolized with a Gel-Foam slurry. Post biopsy imaging demonstrates no evidence of immediate complication. The patient tolerated the procedure well. IMPRESSION: Successful ultrasound-guided core biopsy of liver mass. Electronically Signed   By: Jacqulynn Cadet M.D.   On: 11/12/2021 15:18   IR KYPHO THORACIC WITH BONE BIOPSY  Result Date: 11/12/2021 CLINICAL DATA:  71 year old female with pathologic symptomatic T12  compression fracture. She presents for transpedicular bone biopsy, radiofrequency ablation and cement augmentation with balloon kyphoplasty. EXAM: FLUOROSCOPIC GUIDED T12 VERTEBRAL BODY BIOPSY RF ABLATION AND KYPHOPLASTY/CEMENT AUGMENTATION. COMPARISON:  None Available. MEDICATIONS: Ancef 2 g IV; The antibiotic was administered in an appropriate time interval prior to needle puncture of the skin. ANESTHESIA/SEDATION: Versed 3 mg IV; Fentanyl 150 mcg IV Moderate Sedation Time: 75 minutes; The patient was continuously monitored during the procedure by the interventional radiology nurse under my direct supervision. FLUOROSCOPY TIME:  Radiation exposure index: 167.5 mGy reference air kerma COMPLICATIONS: None immediate. TECHNIQUE: Informed written consent was obtained from the patient after a thorough discussion of the procedural risks, benefits and alternatives. All questions were addressed. Maximal Sterile Barrier Technique was utilized including caps, mask, sterile gowns, sterile gloves, sterile drape, hand hygiene and skin antiseptic. A timeout was performed prior to the initiation of the procedure. The patient was placed prone on the fluoroscopic table. The skin overlying the thoracic region was then prepped and draped in the usual sterile fashion. Maximal barrier sterile technique was utilized including caps, mask, sterile gowns, sterile gloves, sterile drape, hand hygiene and skin antiseptic. Intravenous Fentanyl and Versed were administered as conscious sedation during continuous cardiorespiratory monitoring by the radiology RN. The left pedicle at T12 was then infiltrated with 1% lidocaine followed by the advancement of a Kyphon trocar needle through the left pedicle into the posterior one-third of the vertebral body. A biopsy specimen was then obtained. Subsequently, the osteo drill was advanced to the anterior third of the vertebral body. The  osteo drill was retracted. Through the working cannula, a 15 mm  OsteoCool RF ablation probe was inserted and positioned under fluoroscopic guidance. In similar fashion, the right T12 pedicle was infiltrated with 1% lidocaine. Utilizing a extra pedicular approach, a second Kyphon trocar needle was advanced into the posterior third of the vertebral body. A biopsy specimen was then obtained. Subsequently, the osteo drill was coaxially advanced to the anterior right third. The osteo drill was exchanged for a 15 mm OsteoCool RF ablation probe which was positioned under fluoroscopic guidance. With both OsteoCool ablation probes in place, the ablation was performed for 11 minutes. Attention was now paid towards the kyphoplasty portion of the procedure. Beginning at the T12 vertebral body level, a Kyphon inflatable bone tamp 15 x 2.5 was advanced through both working cannulas and positioned with the distal marker approximately 5 mm from the anterior aspect of the cortex. Appropriate positioning was confirmed on the AP projection. At this time, the balloon was expanded using contrast via a Kyphon inflation syringe device via micro tubing. Inflations were continued under direct fluoroscopic guidance. At this time, methylmethacrylate mixture was reconstituted in the Kyphon bone mixing device system. This was then loaded into the delivery mechanism, attached to Kyphon bone fillers. The balloons were deflated and removed followed by the instillation of methylmethacrylate mixture with excellent filling in the AP and lateral projections. The working cannulae and the bone filler were then retrieved and removed. Multiple spot radiographic images were obtained in various obliquities. Hemostasis was achieved with manual compression. The patient tolerated the procedure well without immediate postprocedural complication. FINDINGS: Completion images demonstrate a technically excellent result with adequate cement filling of the T12 vertebral body on both the AP and lateral projections. No  extravasation was noted in the disk spaces or posteriorly into the spinal canal. No epidural venous contamination was seen. IMPRESSION: Technically successful T12 vertebral body biopsy, ablation and cement augmentation using balloon kyphoplasty. PLAN: The patient will be seen for clinical follow-up at the interventional radiology clinic in 2-4 weeks. Signed, Criselda Peaches, MD [facility] Rad[facility] Electronically Signed   By: Jacqulynn Cadet M.D.   On: 11/12/2021 15:57    Labs:  CBC: Recent Labs    11/07/21 0439 11/09/21 0447 11/12/21 0531 11/13/21 0535  WBC 12.8* 12.3* 15.6* 16.7*  HGB 13.6 14.9 15.1* 16.1*  HCT 44.5 49.1* 48.7* 52.5*  PLT 193 217 193 185    COAGS: Recent Labs    11/12/21 0531  INR 1.0    BMP: Recent Labs    11/09/21 0447 11/11/21 0344 11/12/21 0531 11/13/21 0535  NA 139 139 138 140  K 4.2 4.2 3.8 4.2  CL 94* 89* 89* 90*  CO2 37* 41* 36* 38*  GLUCOSE 109* 115* 116* 117*  BUN 15 19 19  27*  CALCIUM 9.8 10.0 10.1 10.2  CREATININE 1.17* 1.37* 1.10* 1.59*  GFRNONAA 50* 41* 54* 35*    LIVER FUNCTION TESTS: Recent Labs    03/20/21 1119 06/04/21 1342 11/05/21 1916  BILITOT 0.5 0.6 0.8  AST 14 17 62*  ALT 11 14 59*  ALKPHOS 85 88 70  PROT 6.6 7.3 6.8  ALBUMIN 3.9 3.9 3.4*    Assessment and Plan: Patient with complaints of back pain with imaging findings of T12 pathologic fracture and associated back pain and multiple metastases throughout the body. Patient is s/p T12 bone biopsy followed by osteocool RFA and KP and liver mass biopsy with IR on 5/30. Patient  does complain of ongoing back pain but feels it is different than pre-procedure. Not unexpected to have pain immediately post procedure, should see improvement in pain over next few days. Wbc slightly elevated, afebrile continue to follow closely.  TSLO brace to be worn only as needed. Procedural site without complication and surgical path  pending.  IR will f/u with a phone call in 2 weeks.    Electronically Signed: Hedy Jacob, PA-C 11/13/2021, 10:47 AM   I spent a total of 15 Minutes at the the patient's bedside AND on the patient's hospital floor or unit, greater than 50% of which was counseling/coordinating care for T12 pathologic fracture and liver mass.

## 2021-11-13 NOTE — Evaluation (Signed)
Physical Therapy Evaluation Patient Details Name: Daisy [name] MRN: [id number] DOB: 07-05-1950 Today's Date: 11/13/2021  History of Present Illness  Daisy [name] is a 50yoF PMH: morbid obesity, OSA on BiPAP+O2, CRF O2 prn, PVD, CKD2, BrCA s/p lumpectomy and radiation therapy, dCHF, COPD. Pt was recently discharged from the hospital after treatment for acute on chronic hypoxic and hypercapnic respiratory failure secondary to CHF who presents to the emergency room for evaluation of abdominal pain mostly in the left lower quadrant associated with diarrhea. 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 Pt went to procedure on 5/30 with IR for RFA and vertebroplasty at T12.  Clinical Impression  Pt in bed on entry, agreeable to session. Pt reports pain well controlled at entry while in bed. Pt reports pain earlier in day when sitting at EOB. DTR and DIL at bedside for session, both provide caregiver duties at pt's baseline, but also work part of the week. minA to come to EOB very limited by pain and exertion, education on log roll technique. Pt spends most of session at EOB, saturating well on baseline flow rate, but elevated pain despite having gotten meds earlier, anxious about worse pain. Pt able to rise STS twice, but pain precludes ability to stand long enough to don TLSO. Pt returns to sitting impulsively due to pain, also becomes less interactive and withdrawn. Pt dizzy throughout session. Pt remains significantly weak, unable to tolerate any significant AMB distances past 3 sessions, does not tolerate upright standing for long. Pt remains far from her baseline, in her current state needs a higher level of care to regain independence and for ADL needs to be safely met. Author discussed STR interest and recommendations to pt and family who are all in agreement to this being the best current option.      Recommendations for follow up therapy  are one component of a multi-disciplinary discharge planning process, led by the attending physician.  Recommendations may be updated based on patient status, additional functional criteria and insurance authorization.  Follow Up Recommendations Skilled nursing-short term rehab (<3 hours/day)    Assistance Recommended at Discharge Intermittent Supervision/Assistance  Patient can return home with the following  Assistance with cooking/housework;Assist for transportation;Help with stairs or ramp for entrance;Two people to help with walking and/or transfers;Two people to help with bathing/dressing/bathroom    Equipment Recommendations  (defer to facility; if going direct to home, would need to elevat current bed, obtain bed rails for current bed, would need a WC with elevated leg rests and cushion, would need 24/7 supervision.)  Recommendations for Other Services       Functional Status Assessment Patient has had a recent decline in their functional status and demonstrates the ability to make significant improvements in function in a reasonable and predictable amount of time.     Precautions / Restrictions Precautions Precautions: Fall;Back (Simultaneous filing. User may not have seen previous data.) Precaution Comments: watch O2 Required Braces or Orthoses: Spinal Brace (Simultaneous filing. User may not have seen previous data.) Spinal Brace: Thoracolumbosacral orthotic (Simultaneous filing. User may not have seen previous data.) Restrictions Other Position/Activity Restrictions: TLSO on when OOB except for when showering, confirmed with Dr. Izora Ribas 11/08/21 that brace may be off in bed      Mobility  Bed Mobility Overal bed mobility: Needs Assistance (Simultaneous filing. User may not have seen previous data.) Bed Mobility: Supine to Sit, Sit to Supine (Simultaneous filing. User may not  have seen previous data.) Rolling: Min assist   Supine to sit: Min assist (Simultaneous filing.  User may not have seen previous data.)     General bed mobility comments: educated on use of log roll to protect lumbar spine (Simultaneous filing. User may not have seen previous data.)    Transfers Overall transfer level: Needs assistance (Simultaneous filing. User may not have seen previous data.) Equipment used: Rolling walker (2 wheels) (BRW  Simultaneous filing. User may not have seen previous data.) Transfers: Sit to/from Stand (Simultaneous filing. User may not have seen previous data.) Sit to Stand: Min guard (Simultaneous filing. User may not have seen previous data.)           General transfer comment: twice at minA from standard and elevated surface. (Simultaneous filing. User may not have seen previous data.)    Ambulation/Gait   Gait Distance (Feet): 8 Feet Assistive device:  (BRW)         General Gait Details: AMB steps toward window, tries to tripod on elbows in windowsill, heavy cues given to cease and return to EOB if position rest needed  Stairs            Wheelchair Mobility    Modified Rankin (Stroke Patients Only)       Balance                                             Pertinent Vitals/Pain Pain Assessment Pain Assessment: Faces Faces Pain Scale: Hurts whole lot (Pt is overwhelmed with pain, unable to communicate very well regarding intensity when it is highest) Pain Location: back Pain Intervention(s): Limited activity within patient's tolerance, Premedicated before session, Repositioned, Patient requesting pain meds-RN notified    Home Living Family/patient expects to be discharged to:: Private residence Living Arrangements: Children Available Help at Discharge: Family;Available 24 hours/day Type of Home: House Home Access: Stairs to enter;Other (comment) Entrance Stairs-Rails: Right;Left;Can reach both Entrance Stairs-Number of Steps: 5   Home Layout: One level Home Equipment: Conservation officer, nature (2  wheels);Rollator (4 wheels);Tub bench;Hand held shower head;Adaptive equipment;BSC/3in1 Additional Comments: Ramp is scheduled to be built on 11/14/21    Prior Function Prior Level of Function : Needs assist             Mobility Comments: Mod Ind amb with a RW household distances, uses facility w/c for MD apts, no fall history ADLs Comments: Daughter assists with bathing and dressing, pt unable to perform pericare     Hand Dominance   Dominant Hand: Right    Extremity/Trunk Assessment   Upper Extremity Assessment Upper Extremity Assessment: Generalized weakness    Lower Extremity Assessment Lower Extremity Assessment: Generalized weakness       Communication   Communication: No difficulties  Cognition Arousal/Alertness: Awake/alert Behavior During Therapy: Flat affect, Impulsive                                   General Comments: slow processing during this session and impulsive with movement needing increased cuing for safety awareness        General Comments      Exercises     Assessment/Plan    PT Assessment Patient needs continued PT services  PT Problem List Decreased strength;Decreased activity tolerance;Decreased balance;Decreased mobility;Decreased knowledge of use of DME;Pain  PT Treatment Interventions DME instruction;Gait training;Therapeutic activities;Therapeutic exercise;Functional mobility training;Balance training;Patient/family education;Stair training    PT Goals (Current goals can be found in the Care Plan section)  Acute Rehab PT Goals Patient Stated Goal: optimize for return to home PT Goal Formulation: With patient Time For Goal Achievement: 11/27/21 Potential to Achieve Goals: Fair    Frequency 7X/week     Co-evaluation               AM-PAC PT "6 Clicks" Mobility  Outcome Measure Help needed turning from your back to your side while in a flat bed without using bedrails?: A Lot Help needed moving from  lying on your back to sitting on the side of a flat bed without using bedrails?: A Lot Help needed moving to and from a bed to a chair (including a wheelchair)?: A Little Help needed standing up from a chair using your arms (e.g., wheelchair or bedside chair)?: A Little Help needed to walk in hospital room?: A Lot Help needed climbing 3-5 steps with a railing? : Total 6 Click Score: 13    End of Session Equipment Utilized During Treatment: Oxygen Activity Tolerance: Patient limited by pain Patient left: in bed;with family/visitor present;with nursing/sitter in room Nurse Communication: Mobility status PT Visit Diagnosis: Difficulty in walking, not elsewhere classified (R26.2);Muscle weakness (generalized) (M62.81);Other abnormalities of gait and mobility (R26.89)    Time: 8367-2550 PT Time Calculation (min) (ACUTE ONLY): 34 min   Charges:   PT Evaluation $PT Re-evaluation: 1 Re-eval PT Treatments $Therapeutic Exercise: 8-22 mins       3:52 PM, 11/13/21 Etta Grandchild, PT, DPT Physical Therapist - [facility]  [phone] ([place])    Day[place] C 11/13/2021, 3:47 PM

## 2021-11-13 NOTE — Evaluation (Signed)
Occupational Therapy RE-Evaluation Patient Details Name: Daisy [name] MRN: [id number] DOB: Jan 21, 1951 Today's Date: 11/13/2021   History of Present Illness Daisy [name] is a 60yoF PMH: morbid obesity, OSA on BiPAP+O2, CRF O2 prn, PVD, CKD2, BrCA s/p lumpectomy and radiation therapy, dCHF, COPD. Pt was recently discharged from the hospital after treatment for acute on chronic hypoxic and hypercapnic respiratory failure secondary to CHF who presents to the emergency room for evaluation of abdominal pain mostly in the left lower quadrant associated with diarrhea. 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 Pt went to procedure on 5/30 with IR for RFA and vertebroplasty at T12.   Clinical Impression   Pt seen for re-evaluation after vertebroplasty of T12. Pt is pleasant and cooperative with friend present in the room. She is agreeable to attempt functional transfer to [facility]. Pt performs bed mobility with assistance for trunk support and LEs. Pt stands with min A and stand pivot transfer from bed >BSC with RW but needing increased cuing for hand position and safety as pt moves impulsively this session. Pt able to void but needing assistance with hygiene and clothing management.  While standing for hygiene she reports, " I have to sit down" after standing less than 1 minute and then begins to turn herself around without assistive device in the direction where there is no item for her to sit and needing cuing and increased assistance for safety and to return back to bed. Pt also requiring increased time to process questions and answer during session. Pt appears to need increased assistance and with decreased endurance requiring new recommendation for short term rehab to address functional deficits before returning home.      Recommendations for follow up therapy are one component of a multi-disciplinary discharge planning process, led by the  attending physician.  Recommendations may be updated based on patient status, additional functional criteria and insurance authorization.   Follow Up Recommendations  Skilled nursing-short term rehab (<3 hours/day)    Assistance Recommended at Discharge Frequent or constant Supervision/Assistance  Patient can return home with the following A little help with walking and/or transfers;A lot of help with bathing/dressing/bathroom;Assistance with cooking/housework;Assist for transportation;Help with stairs or ramp for entrance    Functional Status Assessment  Patient has had a recent decline in their functional status and demonstrates the ability to make significant improvements in function in a reasonable and predictable amount of time.  Equipment Recommendations  Other (comment) (defer to next venue of care)       Precautions / Restrictions Precautions Precautions: Fall;Back (Simultaneous filing. User may not have seen previous data.) Precaution Comments: watch O2 Required Braces or Orthoses: Spinal Brace (Simultaneous filing. User may not have seen previous data.) Spinal Brace: Thoracolumbosacral orthotic (Simultaneous filing. User may not have seen previous data.) Restrictions Other Position/Activity Restrictions: TLSO on when OOB except for when showering, confirmed with Dr. Izora Ribas 11/08/21 that brace may be off in bed      Mobility Bed Mobility           Sit to supine: Mod assist        Transfers             Step pivot transfers: Min assist            Balance Overall balance assessment: Needs assistance Sitting-balance support: Feet supported, No upper extremity supported Sitting balance-Leahy Scale: Good     Standing balance support: Bilateral upper extremity supported, During  functional activity, Reliant on assistive device for balance Standing balance-Leahy Scale: Fair Standing balance comment: reliant on UE support                            ADL either performed or assessed with clinical judgement   ADL Overall ADL's : Needs assistance/impaired                         Toilet Transfer: BSC/3in1;Rolling walker (2 wheels);Moderate assistance   Toileting- Clothing Manipulation and Hygiene: Moderate assistance Toileting - Clothing Manipulation Details (indicate cue type and reason): assist for hygiene and clothing management     Functional mobility during ADLs: Minimal assistance;Min guard;Rolling walker (2 wheels)       Vision Patient Visual Report: No change from baseline              Pertinent Vitals/Pain Pain Assessment Pain Assessment: Faces Faces Pain Scale: Hurts even more Pain Location: back Pain Descriptors / Indicators: Sore, Aching Pain Intervention(s): Monitored during session, Repositioned     Hand Dominance Right   Extremity/Trunk Assessment Upper Extremity Assessment Upper Extremity Assessment: Generalized weakness   Lower Extremity Assessment Lower Extremity Assessment: Generalized weakness       Communication Communication Communication: No difficulties   Cognition Arousal/Alertness: Awake/alert Behavior During Therapy: Flat affect, Impulsive Overall Cognitive Status: Impaired/Different from baseline                                 General Comments: slow processing during this session and impulsive with movement needing increased cuing for safety awareness                Home Living Family/patient expects to be discharged to:: Private residence Living Arrangements: Children Available Help at Discharge: Family;Available 24 hours/day Type of Home: House Home Access: Stairs to enter;Other (comment) Entrance Stairs-Number of Steps: 5 Entrance Stairs-Rails: Right;Left;Can reach both Home Layout: One level     Bathroom Shower/Tub: Teacher, early years/pre: Standard     Home Equipment: Conservation officer, nature (2 wheels);Rollator (4 wheels);Tub  bench;Hand held shower head;Adaptive equipment;BSC/3in1 Adaptive Equipment: Reacher Additional Comments: Ramp is scheduled to be built on 11/14/21      Prior Functioning/Environment Prior Level of Function : Needs assist             Mobility Comments: Mod Ind amb with a RW household distances, uses facility w/c for MD apts, no fall history ADLs Comments: Daughter assists with bathing and dressing, pt unable to perform pericare        OT Problem List: Impaired balance (sitting and/or standing);Decreased activity tolerance;Decreased strength;Decreased safety awareness      OT Treatment/Interventions: Self-care/ADL training;Balance training;Therapeutic exercise;Therapeutic activities;Energy conservation;DME and/or AE instruction;Patient/family education    OT Goals(Current goals can be found in the care plan section) Acute Rehab OT Goals Patient Stated Goal: to get stronger and decrease pain OT Goal Formulation: With patient Time For Goal Achievement: 11/27/21 Potential to Achieve Goals: Fair ADL Goals Pt Will Perform Grooming: with supervision;standing  OT Frequency: Min 2X/week       AM-PAC OT "6 Clicks" Daily Activity     Outcome Measure Help from another person eating meals?: None Help from another person taking care of personal grooming?: A Little Help from another person toileting, which includes using toliet, bedpan, or urinal?: A Lot Help from another person  bathing (including washing, rinsing, drying)?: A Lot Help from another person to put on and taking off regular upper body clothing?: A Little Help from another person to put on and taking off regular lower body clothing?: A Lot 6 Click Score: 16   End of Session Equipment Utilized During Treatment: Rolling walker (2 wheels);Oxygen Nurse Communication: Mobility status  Activity Tolerance: Patient limited by fatigue Patient left: in bed;with call bell/phone within reach;with bed alarm set;with family/visitor  present  OT Visit Diagnosis: Other abnormalities of gait and mobility (R26.89)                Time: 4599-7741 OT Time Calculation (min): 16 min Charges:  OT General Charges $OT Visit: 1 Visit OT Evaluation $OT Re-eval: 1 Re-eval OT Treatments $Self Care/Home Management : 8-22 mins  [name], MS, OTR/L , CBIS ascom [phone]  11/13/21, 3:45 PM

## 2021-11-13 NOTE — Progress Notes (Signed)
   11/13/21 1330  Clinical Encounter Type  Visited With Patient and family together  Visit Type Follow-up;Post-op   Chaplain wanted to provide post-procedure support. Patient had close friend visiting and so the visit was short.

## 2021-11-13 NOTE — Progress Notes (Signed)
Progress Note   Patient: Daisy [name] DGL:[id number] DOB: February 12, 1951 DOA: 11/05/2021     7 DOS: the patient was seen and examined on 11/13/2021   Brief hospital course: 71 year old female with past medical history of breast cancer, GERD, arthritis, chronic diastolic congestive heart failure, chronic kidney disease, depression, hyperlipidemia, morbid obesity, peripheral vascular disease presented to the hospital with back pain.  On imaging studies she was diagnosed with a T12 compression fracture.  She was also found to have a mass of the lung with liver metastases, adrenal metastases and osseous metastases.  The patient was seen in consultation by oncology, interventional radiology, neurosurgery and pulmonary.  Kyphoplasty delayed until insurance authorization was received.  Liver biopsy and kyphoplasty done on 11/12/2021.  Palliative care consulted 5/31 at request of patient's daughter.  Initially PT/OT were recommending home health, but due to progressive weakness, now recommend SNF.  TOC following.   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   Status post kyphoplasty by IR on 11/12/2021.   -- Resume aspirin and Pletal --resume Lovenox injections -- Brace when out of bed -- Pain control as needed  Mass of lower lobe of left lung Patient has a mass of the lung, liver, adrenals and osseous metastases.  Patient does have a history of breast cancer.  Liver biopsy done 5/30. -- Follow liver biopsy path -- Oncology consulted -- Palliative care consulted  Malignant neoplasm of upper-outer quadrant of left breast in female, estrogen receptor positive ([place]) Patient with a known history of left breast cancer status post lumpectomy and radiation treatment. Completed a 5 year course of Anastrazole  Morbid obesity with BMI of 50.0-59.9, adult ([place]) Body mass index is 53.45 kg/m. Complicates overall care and prognosis.  Recommend  lifestyle modifications including physical activity and diet for weight loss and overall long-term health.    Obesity hypoventilation syndrome (HCC) Chronic 2 L of oxygen and BiPAP at night  Generalized anxiety disorder Continue fluoxetine  Essential hypertension Continue metoprolol  COPD with chronic bronchitis and emphysema (HCC) Stable and not acutely exacerbated Continue as needed bronchodilator therapy  Chronic diastolic heart failure (HCC) Stable and not acutely exacerbated.  --Continue metoprolol and torsemide.   --Torsemide reduced to once daily --Monitor volume status  Acute kidney injury superimposed on CKD ([place]) Acute kidney injury on CKD stage IIIa.  Creatinine improved from 1.48 down to 1.10 even with diuresis with torsemide.    5/31: Cr increased to 1.59 --Reduced torsemide BID>>daily --Encourage PO hydration --Monitor BMP --Will hold torsemide altogether if still rising tomorrow        Subjective: Patient was awake resting in bed, seen with daughter and her spouse at bedside.  Patient has become progressively weaker and PT now recommending SNF.  Patient family in agreement.  Family report patient is more confused today, has very very poor appetite and oral intake.  Daughter also reports diarrhea had improved but seems worse again today.  Physical Exam: Vitals:   11/13/21 0518 11/13/21 0817 11/13/21 0819 11/13/21 0820  BP: (!) 145/67 122/69 122/69   Pulse: 93  (!) 104   Resp: 18  17   Temp: 98 F (36.7 C)  97.9 F (36.6 C)   TempSrc:      SpO2: (!) 88%  (!) 80% 94%  Weight: (!) 154.8 kg     Height:       General exam: awake, appears fatigued, no acute distress, obese HEENT: atraumatic, clear conjunctiva, anicteric  sclera, moist mucus membranes, hearing grossly normal  Respiratory system: CTAB diminished by habitus, no wheezes, rales or rhonchi, 3 L/min [place] O2 Cardiovascular system: normal S1/S2, RRR, no pedal edema.   Gastrointestinal system: soft,  nondistended abdomen Central nervous system: A&O x3. no gross focal neurologic deficits, normal speech Extremities: moves all, no edema, normal tone Skin: dry, intact, normal temperature Psychiatry: normal mood, congruent affect, judgement and insight appear normal   Data Reviewed:  Notable labs: Chloride 90, CO2 38, glucose 117, BUN 27, creatinine 1.59, GFR 35, WBC 16.7, hemoglobin 16.1  Family Communication: Daughter and daughter spouse at bedside on rounds today  Disposition: Status is: Inpatient Remains inpatient appropriate because: Ongoing evaluation not appropriate for the outpatient setting.  Requires SNF placement due to profound generalized weakness   Planned Discharge Destination: Skilled nursing facility    Time spent: 40 minutes  Author: Ezekiel Slocumb, DO 11/13/2021 3:45 PM  For on call review [email or web address].

## 2021-11-13 NOTE — Progress Notes (Signed)
[place]  [phone] [phone]  ID: Daisy [name] OB: [birth year]  MR#: [id number]  OEV#:[id number]  Patient Care Team: Lesleigh Noe, MD as PCP - General (Family Medicine) Magrinat, Virgie Dad, MD (Inactive) as Consulting Physician (Oncology) Adrian Prows, MD as Consulting Physician (Cardiology) Rigoberto Noel, MD as Consulting Physician (Pulmonary Disease) Lavonia Dana, MD as Consulting Physician (Nephrology)  CHIEF COMPLAINT: Lung mass with innumerable liver and bone lesions highly suspicious for underlying lung malignancy.    INTERVAL HISTORY: Patient's pain is better controlled today and her back pain is significantly improved since kyphoplasty yesterday.  She continues to have increased weakness and fatigue, but also states this is mildly improved.  She offers no further specific complaints.  REVIEW OF SYSTEMS:   Review of Systems  Constitutional:  Positive for malaise/fatigue. Negative for fever and weight loss.  Respiratory:  Positive for shortness of breath. Negative for cough and hemoptysis.   Cardiovascular: Negative.  Negative for chest pain and leg swelling.  Gastrointestinal: Negative.  Negative for abdominal pain.  Genitourinary: Negative.  Negative for dysuria.  Musculoskeletal: Negative.  Negative for back pain.  Skin: Negative.  Negative for rash.  Neurological:  Positive for weakness. Negative for focal weakness and headaches.  Psychiatric/Behavioral: Negative.  The patient is not nervous/anxious.    As per HPI. Otherwise, a complete review of systems is negative.  PAST MEDICAL HISTORY: Past Medical History:  Diagnosis Date   Acid reflux    Acute heart failure ([place]) 10/17/2019   AKI (acute kidney injury) ([place]) 10/17/2019   Arthritis    Breast cancer of upper-outer quadrant of right female breast ([place]) 11/19/2016   Chicken pox    Cholecystitis    Chronic diastolic heart failure ([place]) 01/04/2020   CKD (chronic  kidney disease), stage III ([place]) 01/04/2020   Depression    Dyspnea    History of radiation therapy 01/20/17-03/02/17   left breast was treated to 50.4 Gy in 18 fractions   HLD (hyperlipidemia) 01/04/2020   Hyperlipemia    Irregular heart rate    Malignant neoplasm of upper-outer quadrant of left breast in female, estrogen receptor positive ([place]) 09/30/2016   Morbid obesity with BMI of 50.0-59.9, adult ([place]) 01/04/2020   Obesity    Peripheral vascular disease ([place])    Pneumonia    Prediabetes 01/04/2020   Urinary tract infection    Urinary tract infection with hematuria     PAST SURGICAL HISTORY: Past Surgical History:  Procedure Laterality Date   ABDOMINAL HYSTERECTOMY     BREAST LUMPECTOMY Left 11/19/2016   BREAST LUMPECTOMY WITH RADIOACTIVE SEED AND SENTINEL LYMPH NODE BIOPSY (Left)   BREAST LUMPECTOMY WITH RADIOACTIVE SEED AND SENTINEL LYMPH NODE BIOPSY Left 11/19/2016   Procedure: BREAST LUMPECTOMY WITH RADIOACTIVE SEED AND SENTINEL LYMPH NODE BIOPSY;  Surgeon: Rolm Bookbinder, MD;  Location: [facility];  Service: General;  Laterality: Left;   CESAREAN SECTION     CESAREAN SECTION     x 2   CHOLECYSTECTOMY  2007   IR BONE TUMOR(S)RF ABLATION  11/12/2021   IR KYPHO THORACIC WITH BONE BIOPSY  11/12/2021   IR US GUIDE BX ASP/DRAIN  11/12/2021   KNEE ARTHROSCOPY     bilateral   KNEE SURGERY Bilateral    LOWER EXTREMITY ANGIOGRAM N/A 04/04/2014   Procedure: LOWER EXTREMITY ANGIOGRAM;  Surgeon: Laverda Page, MD;  Location[facility] CATH LAB;  Service: Cardiovascular;  Laterality: N/A;   OVARIAN CYST REMOVAL     RE-EXCISION  OF BREAST LUMPECTOMY Left 12/12/2016   Procedure: RE-EXCISION OF LEFT BREAST LUMPECTOMY;  Surgeon: Rolm Bookbinder, MD;  Location: [facility];  Service: General;  Laterality: Left;   ROTATOR CUFF REPAIR Right    TONSILLECTOMY     WRIST SURGERY Bilateral     FAMILY HISTORY: Family History  Problem Relation Age of Onset   Heart disease Mother    Lung cancer Father     Heart disease Father    Heart disease Brother     ADVANCED DIRECTIVES (Y/N):  @ADVDIR @  HEALTH MAINTENANCE: Social History   Tobacco Use   Smoking status: Some Days    Packs/day: 1.00    Years: 30.00    Pack years: 30.00    Types: E-cigarettes, Cigarettes    Last attempt to quit: 11/02/2019    Years since quitting: 2.0   Smokeless tobacco: Never  Vaping Use   Vaping Use: Never used  Substance Use Topics   Alcohol use: No   Drug use: No     Colonoscopy:  PAP:  Bone density:  Lipid panel:  Allergies  Allergen Reactions   Elemental Sulfur Hives   Levaquin [Levofloxacin] Other (See Comments)    Body aches   Statins     Current Facility-Administered Medications  Medication Dose Route Frequency Provider Last Rate Last Admin   0.9 %  sodium chloride infusion  250 mL Intravenous PRN Agbata, Tochukwu, MD       acetaminophen (TYLENOL) tablet 650 mg  650 mg Oral Q4H PRN Agbata, Tochukwu, MD   650 mg at 11/13/21 1048   aspirin chewable tablet 81 mg  81 mg Oral Daily Nicole Kindred A, DO   81 mg at 11/13/21 1232   cholecalciferol (VITAMIN D3) tablet 1,000 Units  1,000 Units Oral Daily Agbata, Tochukwu, MD   1,000 Units at 11/13/21 0819   cilostazol (PLETAL) tablet 50 mg  50 mg Oral BID Nicole Kindred A, DO   50 mg at 11/13/21 1232   cyclobenzaprine (FLEXERIL) tablet 5 mg  5 mg Oral TID PRN Collier Bullock, MD   5 mg at 11/10/21 2033   enoxaparin (LOVENOX) injection 70 mg  70 mg Subcutaneous Q24H Rito Ehrlich A, RPH   70 mg at 11/13/21 1234   ezetimibe (ZETIA) tablet 10 mg  10 mg Oral QHS Loletha Grayer, MD   10 mg at 11/12/21 2042   FLUoxetine (PROZAC) capsule 20 mg  20 mg Oral Daily Agbata, Tochukwu, MD   20 mg at 11/13/21 0820   HYDROcodone-acetaminophen (NORCO/VICODIN) 5-325 MG per tablet 1 tablet  1 tablet Oral Q4H PRN Agbata, Tochukwu, MD   1 tablet at 11/13/21 1232   ipratropium-albuterol (DUONEB) 0.5-2.5 (3) MG/3ML nebulizer solution 3 mL  3 mL Nebulization Q6H PRN  Agbata, Tochukwu, MD       loperamide (IMODIUM) capsule 2 mg  2 mg Oral PRN Nicole Kindred A, DO       LORazepam (ATIVAN) tablet 0.5 mg  0.5 mg Oral Q4H PRN Merlyn Lot, MD   0.5 mg at 11/06/21 1342   metoprolol succinate (TOPROL-XL) 24 hr tablet 12.5 mg  12.5 mg Oral Daily Agbata, Tochukwu, MD   12.5 mg at 11/13/21 0817   ondansetron (ZOFRAN) tablet 4 mg  4 mg Oral Q6H PRN Agbata, Tochukwu, MD       Or   ondansetron (ZOFRAN) injection 4 mg  4 mg Intravenous Q6H PRN Agbata, Tochukwu, MD       pantoprazole (PROTONIX) EC tablet 20 mg  20 mg Oral Daily Agbata, Tochukwu, MD   20 mg at 11/13/21 0820   potassium chloride SA (KLOR-CON M) CR tablet 20 mEq  20 mEq Oral BID Loletha Grayer, MD   20 mEq at 11/13/21 0819   sodium chloride flush (NS) 0.9 % injection 3 mL  3 mL Intravenous Q12H Agbata, Tochukwu, MD   3 mL at 11/13/21 0820   sodium chloride flush (NS) 0.9 % injection 3 mL  3 mL Intravenous PRN Agbata, Tochukwu, MD       torsemide (DEMADEX) tablet 20 mg  20 mg Oral Daily Loletha Grayer, MD   20 mg at 11/13/21 0819   vitamin B-12 (CYANOCOBALAMIN) tablet 1,000 mcg  1,000 mcg Oral Daily Agbata, Tochukwu, MD   1,000 mcg at 11/13/21 0819    OBJECTIVE: Vitals:   11/13/21 0820 11/13/21 1731  BP:  121/69  Pulse:  93  Resp:  19  Temp:  (!) 97.1 F (36.2 C)  SpO2: 94% 91%     Body mass index is 53.45 kg/m.    ECOG FS:3 - Symptomatic, >50% confined to bed  General: Well-developed, well-nourished, no acute distress. Eyes: Pink conjunctiva, anicteric sclera. HEENT: Normocephalic, moist mucous membranes. Lungs: No audible wheezing or coughing. Heart: Regular rate and rhythm. Abdomen: Soft, nontender, no obvious distention. Musculoskeletal: No edema, cyanosis, or clubbing. Neuro: Alert, answering all questions appropriately. Cranial nerves grossly intact. Skin: No rashes or petechiae noted. Psych: Normal affect.    LAB RESULTS:  Lab Results  Component Value Date   NA 140  11/13/2021   K 4.2 11/13/2021   CL 90 (L) 11/13/2021   CO2 38 (H) 11/13/2021   GLUCOSE 117 (H) 11/13/2021   BUN 27 (H) 11/13/2021   CREATININE 1.59 (H) 11/13/2021   CALCIUM 10.2 11/13/2021   PROT 6.8 11/05/2021   ALBUMIN 3.4 (L) 11/05/2021   AST 62 (H) 11/05/2021   ALT 59 (H) 11/05/2021   ALKPHOS 70 11/05/2021   BILITOT 0.8 11/05/2021   GFRNONAA 35 (L) 11/13/2021   GFRAA 42 (L) 02/14/2020    Lab Results  Component Value Date   WBC 16.7 (H) 11/13/2021   NEUTROABS 5.2 06/04/2021   HGB 16.1 (H) 11/13/2021   HCT 52.5 (H) 11/13/2021   MCV 99.6 11/13/2021   PLT 185 11/13/2021     STUDIES: DG Chest 2 View  Result Date: 11/05/2021 CLINICAL DATA:  Productive cough, wheezing EXAM: CHEST - 2 VIEW COMPARISON:  10/25/2021 FINDINGS: Left basilar opacity persists, likely representing a lungs are otherwise clear. No pneumothorax. Cardiac size is mildly enlarged. Pulmonary vascularity is normal. No acute bone abnormality. Moderate left pleural effusion with associated left basilar atelectasis or infiltrate. IMPRESSION: Stable moderate left pleural effusion with associated left basilar atelectasis or infiltrate. Given its persistence, CT imaging with contrast would be helpful to exclude the presence of a chronic infiltrate within the left lung base. Electronically Signed   By: Fidela Salisbury M.D.   On: 11/05/2021 19:51   DG Chest 2 View  Result Date: 10/25/2021 CLINICAL DATA:  Shortness of breath, hypoxia, chest congestion EXAM: CHEST - 2 VIEW COMPARISON:  10/24/2019 FINDINGS: Enlargement of cardiac silhouette with pulmonary vascular congestion. Mediastinal contours normal with atherosclerotic calcifications aorta. LEFT pleural effusion and basilar atelectasis versus consolidation. Remaining lungs clear. No pneumothorax or acute osseous findings. IMPRESSION: Enlargement of cardiac silhouette with pulmonary vascular congestion. LEFT basilar opacity in consistent with LEFT pleural effusion and  associated atelectasis versus consolidation LEFT lower lobe. Aortic Atherosclerosis (ICD10-I70.0). Electronically  Signed   By: Lavonia Dana M.D.   On: 10/25/2021 15:00   DG Lumbar Spine 2-3 Views  Result Date: 10/26/2021 CLINICAL DATA:  Low back pain. EXAM: LUMBAR SPINE - 2-3 VIEW COMPARISON:  12/26/2008. FINDINGS: There is no evidence of acute lumbar spine fracture. Alignment is normal. Multilevel intervertebral disc space narrowing, degenerative endplate osteophyte formation, and facet arthropathy is noted. There is atherosclerotic calcification of the abdominal aorta. IMPRESSION: Moderate degenerative changes in the lumbar spine. No evidence of acute fracture. Electronically Signed   By: Brett Fairy M.D.   On: 10/26/2021 21:42   MR THORACIC SPINE W WO CONTRAST  Result Date: 11/06/2021 CLINICAL DATA:  Bone lesion, thoracic spine.  Compression fracture. EXAM: MRI THORACIC WITHOUT AND WITH CONTRAST TECHNIQUE: Multiplanar and multiecho pulse sequences of the thoracic spine were obtained without and with intravenous contrast. CONTRAST:  73mL GADAVIST GADOBUTROL 1 MMOL/ML IV SOLN COMPARISON:  CT chest, abdomen, and pelvis 11/06/2021 FINDINGS: Alignment:  Trace anterolisthesis of T4 on T5. Vertebrae: Widespread T1 hypointense, enhancing lesions at every level in the thoracic spine involving the vertebral bodies and posterior elements. Pathologic T12 compression fracture with 30% vertebral body height loss. Mild bulging of the posterior cortex of the T12 vertebral body with suspected small volume ventral epidural tumor not resulting in significant spinal stenosis or spinal cord mass effect. Widespread bone lesions throughout the visualized portions of the cervical spine, lumbar spine, and pelvis. Cord:  Normal cord signal.  No abnormal intradural enhancement. Paraspinal and other soft tissues: Obstructing left lower lobe pulmonary mass, mediastinal lymphadenopathy, and bilateral adrenal masses as shown on  today's earlier CT. Asymmetric left renal atrophy. Disc levels: Diffuse thoracic disc and facet degeneration. Central/paracentral disc protrusions from T5-6 to T10-11, largest at T5-6 and T8-9 where there is mild ventral cord flattening without significant generalized spinal stenosis. IMPRESSION: 1. Widespread osseous metastases throughout the spine and visualized pelvis. 2. Pathologic T12 compression fracture with 30% height loss and suspected small volume ventral epidural tumor. No significant spinal stenosis. 3. Diffuse thoracic disc and facet degeneration as above. Electronically Signed   By: Logan Bores M.D.   On: 11/06/2021 15:06   CT CHEST ABDOMEN PELVIS W CONTRAST  Result Date: 11/06/2021 CLINICAL DATA:  Sepsis, diarrhea EXAM: CT CHEST, ABDOMEN, AND PELVIS WITH CONTRAST TECHNIQUE: Multidetector CT imaging of the chest, abdomen and pelvis was performed following the standard protocol during bolus administration of intravenous contrast. RADIATION DOSE REDUCTION: This exam was performed according to the departmental dose-optimization program which includes automated exposure control, adjustment of the mA and/or kV according to patient size and/or use of iterative reconstruction technique. CONTRAST:  183mL OMNIPAQUE IOHEXOL 300 MG/ML  SOLN COMPARISON:  None Available. FINDINGS: CT CHEST FINDINGS Cardiovascular: Moderate coronary artery calcification. Left anterior descending coronary artery stenting has been performed. Global cardiac size within normal limits. No pericardial effusion. Central pulmonary arteries are enlarged in keeping with changes of pulmonary arterial hypertension. Moderate atherosclerotic calcification within the thoracic aorta. No aortic aneurysm. Mediastinum/Nodes: There is pathologic mediastinal adenopathy within the prevascular, right paratracheal, subcarinal, and aortopulmonary lymph node groups. Index lymph node measures 1.8 x 2.7 cm at axial image # 21/2 within the right  paratracheal lymph node group. Visualized thyroid is unremarkable. Esophagus unremarkable. Lungs/Pleura: There is a a central pulmonary mass within the left lower lobe which is poorly delineated due to the adjacent collapsed lung, but results in obliteration of the left lower lobar pulmonary bronchus and narrowing of the left inferior pulmonary  vein. This measures roughly 2.7 x 3.8 cm at axial image # 37/2. There is a suspected pleural metastasis within the left cardiophrenic angle seen at axial image # 43/2 and inferiorly at axial image # 49/2. Small left pleural effusion is present. Nodular pleural thickening anteriorly within the left costophrenic sulcus anteriorly. Left upper lobe and right lung are clear. No pneumothorax. No pleural effusion on the right. Musculoskeletal: No acute bone abnormality. No lytic or blastic bone lesion. Surgical clips are seen within the left breast. CT ABDOMEN PELVIS FINDINGS Hepatobiliary: Innumerable hypoenhancing masses are seen throughout the liver in keeping with widespread hepatic metastases. Mild resultant hepatomegaly. Cholecystectomy has been performed. No intra or extrahepatic biliary ductal dilation. Pancreas: Unremarkable Spleen: Unremarkable Adrenals/Urinary Tract: Bilateral adrenal masses are identified in keeping with bilateral adrenal metastases. The kidneys are normal in position. Moderate right and moderate to severe left renal cortical atrophy. No hydronephrosis. No intrarenal or ureteral calculi. No enhancing intrarenal masses. The bladder is unremarkable. Stomach/Bowel: Moderate sigmoid diverticulosis. The stomach, large bowel, and small bowel are otherwise unremarkable. Appendix normal. No free intraperitoneal gas or fluid. Vascular/Lymphatic: There is extensive aortoiliac atherosclerotic calcification. No aortic aneurysm. Pathologic periportal lymph node is seen measuring 18 mm in short axis diameter at axial image # 60/2. Reproductive: Status post  hysterectomy. No adnexal masses. Other: No abdominal wall hernia. Musculoskeletal: No lytic or blastic bone lesion. There is a a acute to subacute superior endplate fracture of T90 with approximately 10-20% loss of height. No retropulsion. IMPRESSION: Central obstructing mass within the left lower lobe, poorly delineated, most in keeping with a primary pulmonary malignancy with resultant obliteration of the left lower lobar pulmonary bronchus and narrowing of the left inferior pulmonary vein. Resultant collapse of the left lower lobe. 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 PET CT examination would be helpful to better delineate the primary mass and confirm the presence of pleural metastatic disease. Hepatic metastases should be easily amenable to ultrasound-guided biopsy for further evaluation. Moderate coronary artery calcification. Morphologic changes in keeping with pulmonary arterial hypertension. Moderate sigmoid diverticulosis. Acute to subacute superior endplate fracture of Z00 with 10-20% loss of height. No retropulsion. This could be confirmed with MRI examination. Aortic Atherosclerosis (ICD10-I70.0). Electronically Signed   By: Fidela Salisbury M.D.   On: 11/06/2021 01:08   IR Bone Tumor(s)RF Ablation  Result Date: 11/12/2021 CLINICAL DATA:  71 year old female with pathologic symptomatic T12 compression fracture. She presents for transpedicular bone biopsy, radiofrequency ablation and cement augmentation with balloon kyphoplasty. EXAM: FLUOROSCOPIC GUIDED T12 VERTEBRAL BODY BIOPSY RF ABLATION AND KYPHOPLASTY/CEMENT AUGMENTATION. COMPARISON:  None Available. MEDICATIONS: Ancef 2 g IV; The antibiotic was administered in an appropriate time interval prior to needle puncture of the skin. ANESTHESIA/SEDATION: Versed 3 mg IV; Fentanyl 150 mcg IV Moderate  Sedation Time: 75 minutes; The patient was continuously monitored during the procedure by the interventional radiology nurse under my direct supervision. FLUOROSCOPY TIME:  Radiation exposure index: 167.5 mGy reference air kerma COMPLICATIONS: None immediate. TECHNIQUE: Informed written consent was obtained from the patient after a thorough discussion of the procedural risks, benefits and alternatives. All questions were addressed. Maximal Sterile Barrier Technique was utilized including caps, mask, sterile gowns, sterile gloves, sterile drape, hand hygiene and skin antiseptic. A timeout was performed prior to the initiation of the procedure. The patient was placed prone on the fluoroscopic table. The skin overlying the thoracic region was then  prepped and draped in the usual sterile fashion. Maximal barrier sterile technique was utilized including caps, mask, sterile gowns, sterile gloves, sterile drape, hand hygiene and skin antiseptic. Intravenous Fentanyl and Versed were administered as conscious sedation during continuous cardiorespiratory monitoring by the radiology RN. The left pedicle at T12 was then infiltrated with 1% lidocaine followed by the advancement of a Kyphon trocar needle through the left pedicle into the posterior one-third of the vertebral body. A biopsy specimen was then obtained. Subsequently, the osteo drill was advanced to the anterior third of the vertebral body. The osteo drill was retracted. Through the working cannula, a 15 mm OsteoCool RF ablation probe was inserted and positioned under fluoroscopic guidance. In similar fashion, the right T12 pedicle was infiltrated with 1% lidocaine. Utilizing a extra pedicular approach, a second Kyphon trocar needle was advanced into the posterior third of the vertebral body. A biopsy specimen was then obtained. Subsequently, the osteo drill was coaxially advanced to the anterior right third. The osteo drill was exchanged for a 15 mm OsteoCool RF  ablation probe which was positioned under fluoroscopic guidance. With both OsteoCool ablation probes in place, the ablation was performed for 11 minutes. Attention was now paid towards the kyphoplasty portion of the procedure. Beginning at the T12 vertebral body level, a Kyphon inflatable bone tamp 15 x 2.5 was advanced through both working cannulas and positioned with the distal marker approximately 5 mm from the anterior aspect of the cortex. Appropriate positioning was confirmed on the AP projection. At this time, the balloon was expanded using contrast via a Kyphon inflation syringe device via micro tubing. Inflations were continued under direct fluoroscopic guidance. At this time, methylmethacrylate mixture was reconstituted in the Kyphon bone mixing device system. This was then loaded into the delivery mechanism, attached to Kyphon bone fillers. The balloons were deflated and removed followed by the instillation of methylmethacrylate mixture with excellent filling in the AP and lateral projections. The working cannulae and the bone filler were then retrieved and removed. Multiple spot radiographic images were obtained in various obliquities. Hemostasis was achieved with manual compression. The patient tolerated the procedure well without immediate postprocedural complication. FINDINGS: Completion images demonstrate a technically excellent result with adequate cement filling of the T12 vertebral body on both the AP and lateral projections. No extravasation was noted in the disk spaces or posteriorly into the spinal canal. No epidural venous contamination was seen. IMPRESSION: Technically successful T12 vertebral body biopsy, ablation and cement augmentation using balloon kyphoplasty. PLAN: The patient will be seen for clinical follow-up at the interventional radiology clinic in 2-4 weeks. Signed, Criselda Peaches, MD [facility] Electronically Signed    By: Jacqulynn Cadet M.D.   On: 11/12/2021 15:57   IR US Guide Bx Asp/Drain  Result Date: 11/12/2021 INDICATION: 71 year old female with lung mass and advanced metastatic disease including multiple liver lesions. Findings are concerning for possible primary bronchogenic carcinoma with metastatic disease. Patient presents for ultrasound-guided core biopsy of liver mass to establish tissue diagnosis as well as osteo cool procedure of pathologic fracture at T12. EXAM: Ultrasound-guided core biopsy of liver mass MEDICATIONS: None. ANESTHESIA/SEDATION: Total sedation time reported in kyphoplasty procedure. FLUOROSCOPY TIME:  None. COMPLICATIONS: None immediate. PROCEDURE: Informed written consent was obtained from the patient after a thorough discussion of the procedural risks, benefits and alternatives. All questions were addressed. Maximal Sterile Barrier Technique was utilized including caps, mask, sterile gowns, sterile gloves, sterile drape, hand hygiene and skin  antiseptic. A timeout was performed prior to the initiation of the procedure. Ultrasound was used to interrogate the liver. Multiple hypoechoic masses are identified. A suitable mass was selected for biopsy. The skin was sterilely prepped and draped in the standard fashion using chlorhexidine skin prep. Local anesthesia was attained by infiltration with 1% lidocaine. A small dermatotomy was made. An 18 gauge trocar needle was then advanced through the skin, into the liver and positioned at the margin of the liver mass. Multiple 18 gauge core biopsies were then obtained coaxially using the bio Pince automated biopsy device. Biopsy specimens were placed in formalin and delivered to pathology for further analysis. As the introducer needle was removed, the biopsy tract was embolized with a Gel-Foam slurry. Post biopsy imaging demonstrates no evidence of immediate complication. The patient tolerated the procedure well. IMPRESSION: Successful  ultrasound-guided core biopsy of liver mass. Electronically Signed   By: Jacqulynn Cadet M.D.   On: 11/12/2021 15:18   IR KYPHO THORACIC WITH BONE BIOPSY  Result Date: 11/12/2021 CLINICAL DATA:  71 year old female with pathologic symptomatic T12 compression fracture. She presents for transpedicular bone biopsy, radiofrequency ablation and cement augmentation with balloon kyphoplasty. EXAM: FLUOROSCOPIC GUIDED T12 VERTEBRAL BODY BIOPSY RF ABLATION AND KYPHOPLASTY/CEMENT AUGMENTATION. COMPARISON:  None Available. MEDICATIONS: Ancef 2 g IV; The antibiotic was administered in an appropriate time interval prior to needle puncture of the skin. ANESTHESIA/SEDATION: Versed 3 mg IV; Fentanyl 150 mcg IV Moderate Sedation Time: 75 minutes; The patient was continuously monitored during the procedure by the interventional radiology nurse under my direct supervision. FLUOROSCOPY TIME:  Radiation exposure index: 167.5 mGy reference air kerma COMPLICATIONS: None immediate. TECHNIQUE: Informed written consent was obtained from the patient after a thorough discussion of the procedural risks, benefits and alternatives. All questions were addressed. Maximal Sterile Barrier Technique was utilized including caps, mask, sterile gowns, sterile gloves, sterile drape, hand hygiene and skin antiseptic. A timeout was performed prior to the initiation of the procedure. The patient was placed prone on the fluoroscopic table. The skin overlying the thoracic region was then prepped and draped in the usual sterile fashion. Maximal barrier sterile technique was utilized including caps, mask, sterile gowns, sterile gloves, sterile drape, hand hygiene and skin antiseptic. Intravenous Fentanyl and Versed were administered as conscious sedation during continuous cardiorespiratory monitoring by the radiology RN. The left pedicle at T12 was then infiltrated with 1% lidocaine followed by the advancement of a Kyphon trocar needle through the left  pedicle into the posterior one-third of the vertebral body. A biopsy specimen was then obtained. Subsequently, the osteo drill was advanced to the anterior third of the vertebral body. The osteo drill was retracted. Through the working cannula, a 15 mm OsteoCool RF ablation probe was inserted and positioned under fluoroscopic guidance. In similar fashion, the right T12 pedicle was infiltrated with 1% lidocaine. Utilizing a extra pedicular approach, a second Kyphon trocar needle was advanced into the posterior third of the vertebral body. A biopsy specimen was then obtained. Subsequently, the osteo drill was coaxially advanced to the anterior right third. The osteo drill was exchanged for a 15 mm OsteoCool RF ablation probe which was positioned under fluoroscopic guidance. With both OsteoCool ablation probes in place, the ablation was performed for 11 minutes. Attention was now paid towards the kyphoplasty portion of the procedure. Beginning at the T12 vertebral body level, a Kyphon inflatable bone tamp 15 x 2.5 was advanced through both working cannulas and positioned with the distal marker approximately 5  mm from the anterior aspect of the cortex. Appropriate positioning was confirmed on the AP projection. At this time, the balloon was expanded using contrast via a Kyphon inflation syringe device via micro tubing. Inflations were continued under direct fluoroscopic guidance. At this time, methylmethacrylate mixture was reconstituted in the Kyphon bone mixing device system. This was then loaded into the delivery mechanism, attached to Kyphon bone fillers. The balloons were deflated and removed followed by the instillation of methylmethacrylate mixture with excellent filling in the AP and lateral projections. The working cannulae and the bone filler were then retrieved and removed. Multiple spot radiographic images were obtained in various obliquities. Hemostasis was achieved with manual compression. The patient  tolerated the procedure well without immediate postprocedural complication. FINDINGS: Completion images demonstrate a technically excellent result with adequate cement filling of the T12 vertebral body on both the AP and lateral projections. No extravasation was noted in the disk spaces or posteriorly into the spinal canal. No epidural venous contamination was seen. IMPRESSION: Technically successful T12 vertebral body biopsy, ablation and cement augmentation using balloon kyphoplasty. PLAN: The patient will be seen for clinical follow-up at the interventional radiology clinic in 2-4 weeks. Signed, Criselda Peaches, MD [facility] Radiology Electronically Signed   By: Jacqulynn Cadet M.D.   On: 11/12/2021 15:57   ECHOCARDIOGRAM COMPLETE  Result Date: 11/09/2021    ECHOCARDIOGRAM REPORT   Patient Name:   Daisy [name] Date of Exam: 10/27/2021 Medical Rec #:  [id number]            Height:       67.0 in Accession #:    [id number]           Weight:       340.0 lb Date of Birth:  Oct 15, 1950            BSA:          2.534 m Patient Age:    35 years             BP:           110/48 mmHg Patient Gender: F                    HR:           103 bpm. Exam Location:  Inpatient Procedure: 2D Echo, Cardiac Doppler, Color Doppler and Intracardiac            Opacification Agent Indications:     CHF-acute diastolic  History:         Patient has prior history of Echocardiogram examinations, most                  recent 10/18/2019. CHF; Risk Factors:Hypertension, Current Smoker                  and Dyslipidemia. GERD. CKD.  Sonographer:     Clayton Lefort RDCS (AE) Referring Phys:  [id number] Orma Flaming Diagnosing Phys: Rex Kras DO  Sonographer Comments: Technically challenging study due to limited acoustic windows, Technically difficult study due to poor echo windows, suboptimal parasternal window, suboptimal apical window, suboptimal subcostal window and patient is morbidly  obese.  Image acquisition challenging due to patient body habitus. IMPRESSIONS  1. Technically difficult study.  2. Left ventricular ejection fraction, by estimation, is 55 to 60%. The left ventricle has normal function. The left ventricle has no regional wall motion abnormalities. Left ventricular diastolic parameters were normal.  3. Right ventricular systolic function is mildly reduced. The right ventricular size is normal.  4. Right atrial size was grossly normal.  5. The mitral valve is grossly normal. No evidence of mitral valve regurgitation. No evidence of mitral stenosis.  6. The aortic valve was not well visualized. Aortic valve regurgitation is not visualized. No aortic stenosis is present.  7. There is mild dilatation of the ascending aorta, measuring 38 mm.  8. The inferior vena cava is dilated in size with >50% respiratory variability, suggesting right atrial pressure of 8 mmHg. Comparison(s): A prior study was performed on 10/18/2019. LVEF 01-75%, diastolic parameters indeterminate, RV systolic function normal and size not well visualized, trivial MR, dialted IVC, estimated RAP 7mmHG. FINDINGS  Left Ventricle: Left ventricular ejection fraction, by estimation, is 55 to 60%. The left ventricle has normal function. The left ventricle has no regional wall motion abnormalities. Definity contrast agent was given IV to delineate the left ventricular  endocardial borders. The left ventricular internal cavity size was normal in size. There is no left ventricular hypertrophy. Left ventricular diastolic parameters were normal. Right Ventricle: The right ventricular size is normal. No increase in right ventricular wall thickness. Right ventricular systolic function is mildly reduced. Left Atrium: Left atrial size was normal in size. Right Atrium: Right atrial size was grossly normal. Pericardium: There is no evidence of pericardial effusion. Mitral Valve: The mitral valve is grossly normal. No evidence of  mitral valve regurgitation. No evidence of mitral valve stenosis. Tricuspid Valve: The tricuspid valve is grossly normal. Tricuspid valve regurgitation is trivial. No evidence of tricuspid stenosis. Aortic Valve: The aortic valve was not well visualized. Aortic valve regurgitation is not visualized. No aortic stenosis is present. Aortic valve mean gradient measures 3.0 mmHg. Aortic valve peak gradient measures 6.2 mmHg. Aortic valve area, by VTI measures 2.15 cm. Pulmonic Valve: The pulmonic valve was not well visualized. Pulmonic valve regurgitation is not visualized. Aorta: The aortic root is normal in size and structure. There is mild dilatation of the ascending aorta, measuring 38 mm. Venous: The inferior vena cava is dilated in size with greater than 50% respiratory variability, suggesting right atrial pressure of 8 mmHg. IAS/Shunts: The interatrial septum was not well visualized.  LEFT VENTRICLE PLAX 2D LVIDd:         3.60 cm   Diastology LVIDs:         2.70 cm   LV e' medial:    6.00 cm/s LV PW:         1.30 cm   LV E/e' medial:  9.0 LV IVS:        1.60 cm   LV e' lateral:   5.00 cm/s LVOT diam:     2.40 cm   LV E/e' lateral: 10.8 LV SV:         50 LV SV Index:   20 LVOT Area:     4.52 cm  IVC IVC diam: 2.70 cm LEFT ATRIUM           Index LA diam:      3.50 cm 1.38 cm/m LA Vol (A4C): 51.3 ml 20.25 ml/m  AORTIC VALVE AV Area (Vmax):    2.11 cm AV Area (Vmean):   2.33 cm AV Area (VTI):     2.15 cm AV Vmax:           125.00 cm/s AV Vmean:          83.800 cm/s AV VTI:  0.231 m AV Peak Grad:      6.2 mmHg AV Mean Grad:      3.0 mmHg LVOT Vmax:         58.20 cm/s LVOT Vmean:        43.100 cm/s LVOT VTI:          0.110 m LVOT/AV VTI ratio: 0.48  AORTA Ao Root diam: 3.50 cm Ao Asc diam:  3.80 cm MV E velocity: 54.00 cm/s MV A velocity: 80.00 cm/s  SHUNTS MV E/A ratio:  0.68        Systemic VTI:  0.11 m                            Systemic Diam: 2.40 cm Sunit Tolia DO Electronically signed by Rex Kras DO Signature Date/Time: 11/09/2021/3:40:39 PM    Final     ASSESSMENT: Lung mass with innumerable liver lesions as well as vertebral lesions highly suspicious for underlying malignancy.  PLAN:    Lung mass with innumerable liver lesions and vertebral lesions: CT scan results from Nov 06, 2021 reviewed independently and reported as above with central obstructing mass within the left lower lobe of the lung measuring at least 2.7 x 3.8 cm, innumerable hepatic lesions: Lesions.  Patient underwent kyphoplasty T12 lesion yesterday with improvement of her pain.  She also had liver biopsy yesterday as well.  Results are pending at time of dictation.  Patient will also require PET scan to complete staging work-up upon discharge.  CT scan of the head did not reveal any metastatic disease. History of breast cancer: ER/PR positive, stage I.  Patient had low risk Oncotype therefore did not require chemotherapy.  She completed 5 years of anastrozole approximately 1 month ago. Suspect this is a second primary and unrelated to her history of breast cancer.  CA 27-29 is within normal limits. Pain.  Improved.  Patient underwent kyphoplasty yesterday.  Continue narcotics as prescribed. Decreased performance status: Patient will likely need placement in rehab to improve her performance status in order to undergo chemotherapy. Renal insufficiency: Mild, monitor.  Patient's creatinine is 1.59.  Will follow.  Lloyd Huger, MD   11/13/2021 5:59 PM

## 2021-11-14 DIAGNOSIS — Z7189 Other specified counseling: Secondary | ICD-10-CM | POA: Diagnosis not present

## 2021-11-14 DIAGNOSIS — C3492 Malignant neoplasm of unspecified part of left bronchus or lung: Secondary | ICD-10-CM | POA: Diagnosis not present

## 2021-11-14 DIAGNOSIS — S22080K Wedge compression fracture of T11-T12 vertebra, subsequent encounter for fracture with nonunion: Secondary | ICD-10-CM | POA: Diagnosis not present

## 2021-11-14 DIAGNOSIS — D72829 Elevated white blood cell count, unspecified: Secondary | ICD-10-CM | POA: Diagnosis present

## 2021-11-14 DIAGNOSIS — Z515 Encounter for palliative care: Secondary | ICD-10-CM

## 2021-11-14 DIAGNOSIS — R197 Diarrhea, unspecified: Secondary | ICD-10-CM

## 2021-11-14 HISTORY — DX: Diarrhea, unspecified: R19.7

## 2021-11-14 LAB — BASIC METABOLIC PANEL
Anion gap: 8 (ref 5–15)
BUN: 28 mg/dL — ABNORMAL HIGH (ref 8–23)
CO2: 38 mmol/L — ABNORMAL HIGH (ref 22–32)
Calcium: 10.1 mg/dL (ref 8.9–10.3)
Chloride: 94 mmol/L — ABNORMAL LOW (ref 98–111)
Creatinine, Ser: 1.51 mg/dL — ABNORMAL HIGH (ref 0.44–1.00)
GFR, Estimated: 37 mL/min — ABNORMAL LOW (ref >60)
Glucose, Bld: 126 mg/dL — ABNORMAL HIGH (ref 70–99)
Potassium: 4.4 mmol/L (ref 3.5–5.1)
Sodium: 140 mmol/L (ref 135–145)

## 2021-11-14 LAB — URINALYSIS, ROUTINE W REFLEX MICROSCOPIC
Bilirubin Urine: NEGATIVE
Glucose, UA: NEGATIVE mg/dL
Ketones, ur: NEGATIVE mg/dL
Nitrite: NEGATIVE
Protein, ur: NEGATIVE mg/dL
Specific Gravity, Urine: 1.008 (ref 1.005–1.030)
pH: 6 (ref 5.0–8.0)

## 2021-11-14 LAB — CBC
HCT: 48.2 % — ABNORMAL HIGH (ref 36.0–46.0)
Hemoglobin: 14.8 g/dL (ref 12.0–15.0)
MCH: 29.9 pg (ref 26.0–34.0)
MCHC: 30.7 g/dL (ref 30.0–36.0)
MCV: 97.4 fL (ref 80.0–100.0)
Platelets: 156 10*3/uL (ref 150–400)
RBC: 4.95 MIL/uL (ref 3.87–5.11)
RDW: 12.9 % (ref 11.5–15.5)
WBC: 17.1 10*3/uL — ABNORMAL HIGH (ref 4.0–10.5)
nRBC: 0 % (ref 0.0–0.2)

## 2021-11-14 MED ORDER — SODIUM CHLORIDE 0.9 % IV SOLN
1.0000 g | INTRAVENOUS | Status: DC
  Administered 2021-11-14 – 2021-11-16 (×3): 1 g via INTRAVENOUS
  Filled 2021-11-14 (×2): qty 1
  Filled 2021-11-14: qty 10
  Filled 2021-11-14: qty 1

## 2021-11-14 NOTE — Assessment & Plan Note (Addendum)
Possibly reactive in setting of compression fracture and procedures including kyphoplasty liver biopsy.  6/4: White count improving since antibiotics started for UTI -- Follow blood culture, no growth to date -- Antibiotic changed to cefepime 6/4 due to Pseudomonas on urine culture

## 2021-11-14 NOTE — Assessment & Plan Note (Addendum)
C. difficile and GI panel were negative on 5/26.  Imodium as needed.

## 2021-11-14 NOTE — Progress Notes (Signed)
Physical Therapy Treatment Patient Details Name: Daisy [name] MRN: [id number] DOB: 10-22-1950 Today's Date: 11/14/2021   History of Present Illness Jara Feider is a 68yoF PMH: morbid obesity, OSA on BiPAP+O2, CRF O2 prn, PVD, CKD2, BrCA s/p lumpectomy and radiation therapy, dCHF, COPD. Pt was recently discharged from the hospital after treatment for acute on chronic hypoxic and hypercapnic respiratory failure secondary to CHF who presents to the emergency room for evaluation of abdominal pain mostly in the left lower quadrant associated with diarrhea. 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 Pt went to procedure on 5/30 with IR for RFA and vertebroplasty at T12.    PT Comments    Pt asleep in bed at entry, DTR and DIL in room for much of session. Author coordinated max analgesia prior to session to maximize potential for tolerating basic mobility. Pt is rousable easily, but is slightly more altered and impulsive compared to prior day, however pain levels are consistent throughout session. Pt educated on log roll technique again, able to participate in bed-level leg exercises and ROM, multiple variations of STS and SPT transfers with heavy emphasis on sequencing and safety. Pt has some impulsivity during gait training, but largely does well following cues for mobility. Pt able to AMB short distances with BRW quite quickly, maintains adequate saturations on 2L/min. O c/o dizziness and no desaturation is seen. Pt assisted to [facility] for voiding after AMB training. Pt agreeable to sit up in chair after session. Today's sitting goal is 1-2 hours. Family will be in room today and is prepared to assist pt with reposition in recliner should she want to take a little nap later.   Recommendations for follow up therapy are one component of a multi-disciplinary discharge planning process, led by the attending physician.  Recommendations may be updated  based on patient status, additional functional criteria and insurance authorization.  Follow Up Recommendations  Skilled nursing-short term rehab (<3 hours/day)     Assistance Recommended at Discharge Intermittent Supervision/Assistance  Patient can return home with the following Assistance with cooking/housework;Assist for transportation;Help with stairs or ramp for entrance;Two people to help with walking and/or transfers;Two people to help with bathing/dressing/bathroom   Equipment Recommendations       Recommendations for Other Services       Precautions / Restrictions Precautions Precautions: Fall;Back Spinal Brace: Thoracolumbosacral orthotic Restrictions Other Position/Activity Restrictions: TLSO on when OOB except for when showering, confirmed with Dr. Izora Ribas 11/08/21 that brace may be off in bed     Mobility  Bed Mobility Overal bed mobility: Needs Assistance Bed Mobility: Supine to Sit, Rolling, Sidelying to Sit Rolling: Min assist Sidelying to sit: HOB elevated, Min assist       General bed mobility comments: emphasized log roll technqiue again, laborious but per pt has better antalgic potential than other methods    Transfers   Equipment used:  (BRW) Transfers: Sit to/from Stand, Bed to chair/wheelchair/BSC Sit to Stand: Min guard, From elevated surface Stand pivot transfers: From elevated surface Step pivot transfers: Min guard, From elevated surface            Ambulation/Gait   Gait Distance (Feet): 8 Feet (32f twice, forward only) Assistive device:  (BRW)         General Gait Details: appears steady, safe use of BRW for most of AMB activity; pt a bitimpulsive at times, but easily corrected with multimodal cues   Stairs  Wheelchair Mobility    Modified Rankin (Stroke Patients Only)       Balance                                            Cognition Arousal/Alertness: Awake/alert Behavior During  Therapy: Impulsive, WFL for tasks assessed/performed, Flat affect Overall Cognitive Status: Impaired/Different from baseline Area of Impairment: Safety/judgement (seems a bit more impulsive today, suspect due to analgesia, unlike previous day)                         Safety/Judgement: Decreased awareness of safety, Decreased awareness of deficits              Exercises General Exercises - Lower Extremity Short Arc Quad: Both, 15 reps, Supine, Strengthening, AROM Heel Slides: AAROM, Both, 15 reps, Supine, Strengthening Hip ABduction/ADduction: AAROM, Both, 15 reps, Supine, Strengthening Mini-Sqauts: Strengthening, Both, Supine, 10 reps    General Comments        Pertinent Vitals/Pain Pain Assessment Pain Assessment: 0-10 Pain Score: 5  Pain Location: back (consistent throughout) Pain Intervention(s): Limited activity within patient's tolerance    Home Living                          Prior Function            PT Goals (current goals can now be found in the care plan section) Acute Rehab PT Goals Patient Stated Goal: optimize for return to home PT Goal Formulation: With patient Time For Goal Achievement: 11/27/21 Potential to Achieve Goals: Fair Progress towards PT goals: Progressing toward goals    Frequency    7X/week      PT Plan Current plan remains appropriate    Co-evaluation              AM-PAC PT "6 Clicks" Mobility   Outcome Measure  Help needed turning from your back to your side while in a flat bed without using bedrails?: A Lot Help needed moving from lying on your back to sitting on the side of a flat bed without using bedrails?: A Lot Help needed moving to and from a bed to a chair (including a wheelchair)?: A Lot Help needed standing up from a chair using your arms (e.g., wheelchair or bedside chair)?: A Lot Help needed to walk in hospital room?: A Lot Help needed climbing 3-5 steps with a railing? : Total 6 Click  Score: 11    End of Session Equipment Utilized During Treatment: Oxygen Activity Tolerance: Patient limited by pain Patient left: with family/visitor present;with nursing/sitter in room;in chair;with call bell/phone within reach Nurse Communication: Mobility status PT Visit Diagnosis: Difficulty in walking, not elsewhere classified (R26.2);Muscle weakness (generalized) (M62.81);Other abnormalities of gait and mobility (R26.89)     Time: 3559-7416 PT Time Calculation (min) (ACUTE ONLY): 35 min  Charges:  $Gait Training: 8-22 mins $Therapeutic Exercise: 8-22 mins                    3:04 PM, 11/14/21 Etta Grandchild, PT, DPT Physical Therapist - [facility]  [phone] ([place])     Willard C 11/14/2021, 2:57 PM

## 2021-11-14 NOTE — Progress Notes (Signed)
[facility] Liaison Note  Notified by [facility] of patient/family request of [facility] Pal[facility] services.  [facility] hospital liaison will follow patient for discharge disposition.   Please call with any questions/concerns.    Thank you for the opportunity to participate in this patient's care.   Daphene Calamity, MSW [facility] Liaison  [phone]

## 2021-11-14 NOTE — Consult Note (Signed)
[facility] Inpatient Consult  11/14/2021  Daisy [name] Aug 27, 1950 [id number]  [facility] CM)   Patient was reviewed for less than 30 days readmission and noted high risk score for unplanned readmission. Assessed for post hospital chronic care coordination needs. Per review, current recommendation is for SNF.  Patient's primary provider office offers embedded chronic care coordination team and program and is listed for the transition of care follow up and appointments.   Plan: Will continue to follow for progression and disposition plans.  Of note, [facility] Care Management services does not replace or interfere with any services that are arranged by inpatient case management or social work.   [name], MSN, RN [facility] Liaison Toll free office [phone]

## 2021-11-14 NOTE — Progress Notes (Signed)
Progress Note   Patient: Daisy [name] BTD:[id number] DOB: 1950/12/27 DOA: 11/05/2021     8 DOS: the patient was seen and examined on 11/14/2021   Brief hospital course: 71 year old female with past medical history of breast cancer, GERD, arthritis, chronic diastolic congestive heart failure, chronic kidney disease, depression, hyperlipidemia, morbid obesity, peripheral vascular disease presented to the hospital with back pain.  On imaging studies she was diagnosed with a T12 compression fracture.  She was also found to have a mass of the lung with liver metastases, adrenal metastases and osseous metastases.  The patient was seen in consultation by oncology, interventional radiology, neurosurgery and pulmonary.  Kyphoplasty delayed until insurance authorization was received.  Liver biopsy and kyphoplasty done on 11/12/2021.  Palliative care consulted 5/31 at request of patient's daughter.  Initially PT/OT were recommending home health, but due to progressive weakness, now recommend SNF.  TOC following.   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   Status post kyphoplasty by IR on 11/12/2021.   -- Resume aspirin and Pletal --resume Lovenox injections -- Brace when out of bed -- Pain control as needed  Leukocytosis Possibly reactive in setting of compression fracture and procedures including kyphoplasty liver biopsy.  6/1: WBC has been trending up over the past few days, today 17.1.  Patient has been afebrile and no clinical signs or symptoms of infection.  Treated earlier this admission with Flagyl for presumed postobstructive pneumonia by pulmonology. -- Blood culture and UA ordered -- Low threshold to start antibiotics  Mass of lower lobe of left lung Patient has a mass of the lung, liver, adrenals and osseous metastases.  Patient does have a history of breast cancer.  Liver biopsy done 5/30. -- Follow liver biopsy path --  Oncology consulted -- Palliative care consulted  Acute kidney injury superimposed on CKD ([place]) Acute kidney injury on CKD stage IIIa.  Creatinine improved from 1.48 down to 1.10 even with diuresis with torsemide.    6/1: Cr slightly improved at 1.51 from 1.59 --Reduced torsemide BID>>daily --Encourage PO hydration --Monitor BMP --Will hold torsemide if renal function worsens  Malignant neoplasm of upper-outer quadrant of left breast in female, estrogen receptor positive ([place]) Patient with a known history of left breast cancer status post lumpectomy and radiation treatment. Completed a 5 year course of Anastrazole  Diarrhea C. difficile and GI panel were negative on 5/26.  Imodium as needed.  Monitor volume status and electrolytes.  Morbid obesity with BMI of 50.0-59.9, adult (HCC) Body mass index is 53.45 kg/m. Complicates overall care and prognosis.  Recommend lifestyle modifications including physical activity and diet for weight loss and overall long-term health.    Obesity hypoventilation syndrome (HCC) Chronic 2 L of oxygen and BiPAP at night  Generalized anxiety disorder Continue fluoxetine  Essential hypertension Continue metoprolol  COPD with chronic bronchitis and emphysema (HCC) Stable and not acutely exacerbated Continue as needed bronchodilator therapy  Chronic diastolic heart failure (HCC) Stable and not acutely exacerbated.  --Continue metoprolol and torsemide.   --Torsemide reduced to once daily --Monitor volume status        Subjective: Patient awake resting in bed, daughter at bedside when seen on rounds today.  Patient reports overall feeling okay.  Reports ongoing cough but no worse than typical.  Denies fevers chills or feeling sick at all.  She had some mild dysuria that she attributed to PureWick.  Daughter reports patient has ongoing diarrhea each time she  uses the commode.  Physical Exam: Vitals:   11/13/21 2014 11/13/21 2129 11/14/21 0531  11/14/21 0806  BP: (!) 118/59  (!) 130/57 123/85  Pulse: (!) 101  91 (!) 101  Resp:   20 16  Temp: 98.4 F (36.9 C)  97.9 F (36.6 C) 98.1 F (36.7 C)  TempSrc: Oral     SpO2: 91% 92% 93% 92%  Weight:      Height:       General exam: awake, alert, no acute distress, obese HEENT: Moist mucous membranes, hearing grossly normal, wearing nasal cannula Respiratory system: Lungs clear bilaterally without wheezes or rhonchi, normal respiratory effort at rest on 2 L/min [place] O2 Cardiovascular system: normal S1/S2, RRR, no pedal edema.   Gastrointestinal system: Abdomen soft nontender, bowel sounds present Central nervous system: Alert and oriented x3, normal speech, grossly nonfocal exam Extremities: No lower extremity edema, moves all extremities Skin: dry, intact, normal temperature, no rashes seen on visualized skin Psychiatry: normal mood, congruent affect, judgement and insight appear normal   Data Reviewed:  Notable labs: Chloride 94, CO2 38, glucose 126, BUN 28, creatinine 1.51, GFR 37, WBC up slightly 17.1 from 16.7    Family Communication: Daughter and daughter spouse at bedside on rounds today  Disposition: Status is: Inpatient Remains inpatient appropriate because: Ongoing evaluation not appropriate for the outpatient setting.  Requires SNF placement due to profound generalized weakness   Planned Discharge Destination: Skilled nursing facility    Time spent: 35 minutes  Author: Ezekiel Slocumb, DO 11/14/2021 2:02 PM  For on call review [email or web address].

## 2021-11-14 NOTE — Progress Notes (Signed)
Occupational Therapy Treatment Patient Details Name: Daisy [name] MRN: [id number] DOB: Oct 15, 1950 Today's Date: 11/14/2021   History of present illness Daisy [name] is a 79yoF PMH: morbid obesity, OSA on BiPAP+O2, CRF O2 prn, PVD, CKD2, BrCA s/p lumpectomy and radiation therapy, dCHF, COPD. Pt was recently discharged from the hospital after treatment for acute on chronic hypoxic and hypercapnic respiratory failure secondary to CHF who presents to the emergency room for evaluation of abdominal pain mostly in the left lower quadrant associated with diarrhea. 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 Pt went to procedure on 5/30 with IR for RFA and vertebroplasty at T12.   OT comments  Chart reviewed, pt greeted in chair agreeable to OT tx session. Pt appears with decreased safety awareness and processing on this date. OT will continue to assess. Tx session targeted progressing improving ADL performance and activity tolerance. STS completed 3x with CGA, pt able to tolerate no more than 30 seconds in standing. MOD A required for UB/LB dressing. Pt is left in bedside chair, daughter present, NAD. Discharge recommendation remains appropriate. OT will continue to follow.    Recommendations for follow up therapy are one component of a multi-disciplinary discharge planning process, led by the attending physician.  Recommendations may be updated based on patient status, additional functional criteria and insurance authorization.    Follow Up Recommendations  Skilled nursing-short term rehab (<3 hours/day)    Assistance Recommended at Discharge Frequent or constant Supervision/Assistance  Patient can return home with the following  A little help with walking and/or transfers;A lot of help with bathing/dressing/bathroom;Assistance with cooking/housework;Assist for transportation;Help with stairs or ramp for entrance   Equipment  Recommendations  Other (comment) (per next venue of care)    Recommendations for Other Services      Precautions / Restrictions Precautions Precautions: Fall;Back Precaution Comments: watch O2 Required Braces or Orthoses: Spinal Brace (TSLO brace to be worn as needed for comfort per Dr. Laurence Ferrari, if brace is not helping no need to wear) Spinal Brace: Thoracolumbosacral orthotic Restrictions Weight Bearing Restrictions: No Other Position/Activity Restrictions: TLSO on when OOB except for when showering, confirmed with Dr. Izora Ribas 11/08/21 that brace may be off in bed       Mobility Bed Mobility               General bed mobility comments: NT pt in chair pre/post session    Transfers Overall transfer level: Needs assistance Equipment used:  (bari RW) Transfers: Sit to/from Stand Sit to Stand: Min guard (chair 3 attempts)           General transfer comment: standing for up to 30 seconds with RW before c/o fatigue and sitting down, fair safety awareness during mobility     Balance Overall balance assessment: Needs assistance Sitting-balance support: Feet supported, No upper extremity supported Sitting balance-Leahy Scale: Good     Standing balance support: Bilateral upper extremity supported, During functional activity, Reliant on assistive device for balance Standing balance-Leahy Scale: Fair                             ADL either performed or assessed with clinical judgement   ADL Overall ADL's : Needs assistance/impaired     Grooming: Set up;Sitting   Upper Body Bathing: Moderate assistance;Sitting   Lower Body Bathing: Moderate assistance;Sit to/from stand   Upper Body Dressing : Set up;Sitting  Toilet Transfer: Conservation officer, nature (2 wheels) Toilet Transfer Details (indicate cue type and reason): simulated Toileting- Clothing Manipulation and Hygiene: Moderate assistance              Extremity/Trunk Assessment               Vision       Perception     Praxis      Cognition Arousal/Alertness: Awake/alert Behavior During Therapy: Impulsive, WFL for tasks assessed/performed, Flat affect Overall Cognitive Status: Impaired/Different from baseline Area of Impairment: Safety/judgement, Attention, Following commands, Awareness, Problem solving                   Current Attention Level: Selective   Following Commands: Follows one step commands with increased time Safety/Judgement: Decreased awareness of safety, Decreased awareness of deficits Awareness: Intellectual Problem Solving: Slow processing          Exercises      Shoulder Instructions       General Comments      Pertinent Vitals/ Pain       Pain Assessment Pain Assessment: 0-10 Pain Score: 6  Pain Location: back Pain Descriptors / Indicators: Sore, Aching Pain Intervention(s): Limited activity within patient's tolerance, Repositioned, Monitored during session  Home Living                                          Prior Functioning/Environment              Frequency  Min 2X/week        Progress Toward Goals  OT Goals(current goals can now be found in the care plan section)  Progress towards OT goals: Progressing toward goals     Plan Discharge plan remains appropriate    Co-evaluation                 AM-PAC OT "6 Clicks" Daily Activity     Outcome Measure   Help from another person eating meals?: None Help from another person taking care of personal grooming?: A Little Help from another person toileting, which includes using toliet, bedpan, or urinal?: A Lot Help from another person bathing (including washing, rinsing, drying)?: A Lot Help from another person to put on and taking off regular upper body clothing?: A Little Help from another person to put on and taking off regular lower body clothing?: A Lot 6 Click Score: 16    End of Session Equipment Utilized During  Treatment: Rolling walker (2 wheels);Oxygen  OT Visit Diagnosis: Other abnormalities of gait and mobility (R26.89)   Activity Tolerance Patient limited by fatigue   Patient Left in chair;with call bell/phone within reach;with family/visitor present   Nurse Communication Mobility status        Time: 7159-5396 OT Time Calculation (min): 31 min  Charges: OT General Charges $OT Visit: 1 Visit OT Treatments $Self Care/Home Management : 8-22 mins $Therapeutic Activity: 8-22 mins  Shanon Payor, OTD OTR/L  11/14/21, 3:37 PM

## 2021-11-14 NOTE — Consult Note (Cosign Needed)
Consultation Note Date: 11/14/2021   Patient Name: Daisy [name]  DOB: 10-Sep-1950  MRN: [id number]  Age / Sex: 71 y.o., female  PCP: Lesleigh Noe, MD Referring Physician: Ezekiel Slocumb, DO  Reason for Consultation: Establishing goals of care  HPI/Patient Profile: 71 y.o. female  with past medical history of  breast cancer, GERD, arthritis, chronic diastolic congestive heart failure, chronic kidney disease, depression, hyperlipidemia, morbid obesity, and peripheral vascular disease  admitted on 11/05/2021 with back pain.  Found to have T12 compression fracture.  Then found to have massive lung with liver, adrenal and bone mets.  Patient had kyphoplasty done 5/30.  Liver biopsy done 5/30 pending results.  PMT consulted to discuss goals of care.  Clinical Assessment and Goals of Care: I have reviewed medical records including EPIC notes, labs and imaging, received report from Dr. Arbutus Ped, assessed the patient and then met with patient and daughter Daisy [name] to discuss diagnosis prognosis, [place], EOL wishes, disposition and options.  I introduced Palliative Medicine as specialized medical care for people living with serious illness. It focuses on providing relief from the symptoms and stress of a serious illness. The goal is to improve quality of life for both the patient and the family.  We discussed a brief life review of the patient.  Daisy [name] shares that patient lives with Daisy [name] and her wife, Daisy [name].  She shares patient is somewhat ambulatory with a walker but mostly sedentary.  She tells me patient's been declining over the past month with back pain and poor appetite.  She tells me of some intermittent confusion.  They tell me of patient's prior health history -her breast cancer diagnosed in 2018 and the treatment she received.  Daisy [name] tells me patient underwent radiation and it "took its toll".  They also tell me of patient's new  diagnosis of macular degeneration last month.  They tell me of hospital admission May 2021 due to respiratory failure and new diagnosis of CHF.  They tell me patient wears BiPAP at night.  They also tell me of patient's recent admission earlier this month due to pneumonia.   We discussed patient's current illness and what it means in the larger context of patient's on-going co-morbidities.  Natural disease trajectory and expectations at EOL were discussed.  We discussed patient's metastatic cancer.  We discussed her progressive weakness.  I attempted to elicit values and goals of care important to the patient.  Patient tells me her family is most important to her and she does not want to burden her family.  The difference between aggressive medical intervention and comfort care was considered in light of the patient's goals of care.  Patient is interested in continuing work-up and discussing options available to her.  Patient and daughter both share quality of life is important to them and they are unsure if they will pursue chemotherapy.  At 1 point conversation patient shares she would not want chemotherapy but then later states she would do it "if she had to".  We discussed considering quality life when making medical decisions and attempting to align medical decisions with goals.  Encouraged patient/family to consider DNR/DNI status understanding evidenced based poor outcomes in similar hospitalized patients, as the cause of the arrest is likely associated with chronic/terminal disease rather than a reversible acute cardio-pulmonary event.  Daughter agrees DNR is appropriate.  Patient seems to agree as well but is reluctant to make decision, request time to speak to other daughter before deciding.  Discussed with  patient and family the importance of continued conversation with family and the medical providers regarding overall plan of care and treatment options, ensuring decisions are within the  context of the patients values and GOCs.    Palliative Care services outpatient were explained and offered.  Patient and daughter agreeable to outpatient palliative follow-up.  We discussed her symptoms.  Patient reports that pain is well controlled with hydrocodone.  Questions and concerns were addressed. The family was encouraged to call with questions or concerns.  Primary Decision Maker PATIENT?  Some confusion during conversation Patient's daughters Daisy [name] and [name] have joint HCPOA and patient relies on them for medical decision making    SUMMARY OF RECOMMENDATIONS   Patient interested in pursuing further work-up but also notes she is unsure how she will move forward, she is unsure if she would agree to chemotherapy CODE STATUS discussed -seems they agreed DNR status is appropriate but they request time to discuss with patient's other daughter Patient and family agreeable to outpatient palliative follow-up Symptoms well controlled at this point with current regimen  Code Status/Advance Care Planning: Full code      Primary Diagnoses: Present on Admission:  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 The following aspects are pertinent.  Past Medical History:  Diagnosis Date   Acid reflux    Acute heart failure ([place]) 10/17/2019   AKI (acute kidney injury) ([place]) 10/17/2019   Arthritis    Breast cancer of upper-outer quadrant of right female breast ([place]) 11/19/2016   Chicken pox    Cholecystitis    Chronic diastolic heart failure ([place]) 01/04/2020   CKD (chronic kidney disease), stage III  ([place]) 01/04/2020   Depression    Dyspnea    History of radiation therapy 01/20/17-03/02/17   left breast was treated to 50.4 Gy in 18 fractions   HLD (hyperlipidemia) 01/04/2020   Hyperlipemia    Irregular heart rate    Malignant neoplasm of upper-outer quadrant of left breast in female, estrogen receptor positive ([place]) 09/30/2016   Morbid obesity with BMI of 50.0-59.9, adult ([place]) 01/04/2020   Obesity    Peripheral vascular disease ([place])    Pneumonia    Prediabetes 01/04/2020   Urinary tract infection    Urinary tract infection with hematuria    Social History   Socioeconomic History   Marital status: Single    Spouse name: Not on file   Number of children: 2   Years of education: high school   Highest education level: Not on file  Occupational History   Not on file  Tobacco Use   Smoking status: Some Days    Packs/day: 1.00    Years: 30.00    Pack years: 30.00    Types: E-cigarettes, Cigarettes    Last attempt to quit: 11/02/2019    Years since quitting: 2.0   Smokeless tobacco: Never  Vaping Use   Vaping Use: Never used  Substance and Sexual Activity   Alcohol use: No   Drug use: No   Sexual activity: Not Currently  Other Topics Concern   Not on file  Social History Narrative   07/24/20   From:  the area   Living: with [name] (daughter) and her daughter-in-law   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      Family: 2 Engineer, civil (consulting) and [name] - no grandchildren      Enjoys: Programmer, systems (lab, Barista), play cards      Exercise: not currently    Diet: limits fried foods, tries to follow heart healthy - baked and grilled chicken, limit canned products      Safety   Seat belts: Yes    Guns: Yes  and secure   Safe in relationships: Yes    Social Determinants of Radio broadcast assistant Strain: Not on file  Food Insecurity: Not on file  Transportation Needs: Not on file  Physical Activity: Not on file  Stress: Not on file  Social  Connections: Not on file   Family History  Problem Relation Age of Onset   Heart disease Mother    Lung cancer Father    Heart disease Father    Heart disease Brother    Scheduled Meds:  aspirin  81 mg Oral Daily   cholecalciferol  1,000 Units Oral Daily   cilostazol  50 mg Oral BID   enoxaparin (LOVENOX) injection  70 mg Subcutaneous Q24H   ezetimibe  10 mg Oral QHS   FLUoxetine  20 mg Oral Daily   metoprolol succinate  12.5 mg Oral Daily   pantoprazole  20 mg Oral Daily   potassium chloride SA  20 mEq Oral BID   sodium chloride flush  3 mL Intravenous Q12H   torsemide  20 mg Oral Daily   cyanocobalamin  1,000 mcg Oral Daily   Continuous Infusions:  sodium chloride     cefTRIAXone (ROCEPHIN)  IV     PRN Meds:.sodium chloride, acetaminophen, cyclobenzaprine, HYDROcodone-acetaminophen, ipratropium-albuterol, loperamide, LORazepam, ondansetron **OR** ondansetron (ZOFRAN) IV, sodium chloride flush Allergies  Allergen Reactions   Elemental Sulfur Hives   Levaquin [Levofloxacin] Other (See Comments)    Body aches   Statins    Review of Systems  Constitutional:  Positive for activity change and appetite change.  Neurological:  Positive for weakness.   Physical Exam Constitutional:      General: She is not in acute distress.    Appearance: She is ill-appearing.  Pulmonary:     Effort: Pulmonary effort is normal.  Skin:    General: Skin is warm and dry.  Neurological:     Mental Status: She is alert.     Comments: Intermittent confusion    Vital Signs: BP 123/85 (BP Location: Right Arm)   Pulse (!) 101   Temp 98.1 F (36.7 C)   Resp 16   Ht '5\' 7"'  (1.702 m)   Wt (!) 154.8 kg   SpO2 92%   BMI 53.45 kg/m  Pain Scale: 0-10 POSS *See Group Information*: 1-Acceptable,Awake and alert Pain Score: 7    SpO2: SpO2: 92 % O2 Device:SpO2: 92 % O2 Flow Rate: .O2 Flow Rate (L/min): 2 L/min  IO: Intake/output summary:  Intake/Output Summary (Last 24 hours) at 11/14/2021  1437 Last data filed at 11/14/2021 1412 Gross per 24 hour  Intake 360 ml  Output --  Net 360 ml    LBM: Last BM Date : 11/14/21 Baseline Weight: Weight: (!) 144.7 kg Most recent weight: Weight: (!) 154.8 kg     Palliative Assessment/Data: PPS 40%     *Please note that this is a verbal dictation therefore any spelling or grammatical errors  are due to the "[place] One" system interpretation.   Juel Burrow, DNP, AGNP-C Palliative Medicine Team [phone] Pager: [phone]

## 2021-11-15 ENCOUNTER — Encounter: Payer: Self-pay | Admitting: *Deleted

## 2021-11-15 ENCOUNTER — Other Ambulatory Visit: Payer: Self-pay | Admitting: Anatomic Pathology & Clinical Pathology

## 2021-11-15 DIAGNOSIS — S22080K Wedge compression fracture of T11-T12 vertebra, subsequent encounter for fracture with nonunion: Secondary | ICD-10-CM | POA: Diagnosis not present

## 2021-11-15 DIAGNOSIS — R918 Other nonspecific abnormal finding of lung field: Secondary | ICD-10-CM

## 2021-11-15 DIAGNOSIS — Z7189 Other specified counseling: Secondary | ICD-10-CM | POA: Diagnosis not present

## 2021-11-15 DIAGNOSIS — C50412 Malignant neoplasm of upper-outer quadrant of left female breast: Secondary | ICD-10-CM | POA: Diagnosis not present

## 2021-11-15 DIAGNOSIS — Z515 Encounter for palliative care: Secondary | ICD-10-CM | POA: Diagnosis not present

## 2021-11-15 LAB — BASIC METABOLIC PANEL
Anion gap: 10 (ref 5–15)
BUN: 25 mg/dL — ABNORMAL HIGH (ref 8–23)
CO2: 35 mmol/L — ABNORMAL HIGH (ref 22–32)
Calcium: 10 mg/dL (ref 8.9–10.3)
Chloride: 96 mmol/L — ABNORMAL LOW (ref 98–111)
Creatinine, Ser: 1.18 mg/dL — ABNORMAL HIGH (ref 0.44–1.00)
GFR, Estimated: 49 mL/min — ABNORMAL LOW (ref >60)
Glucose, Bld: 135 mg/dL — ABNORMAL HIGH (ref 70–99)
Potassium: 3.9 mmol/L (ref 3.5–5.1)
Sodium: 141 mmol/L (ref 135–145)

## 2021-11-15 LAB — CBC
HCT: 48.1 % — ABNORMAL HIGH (ref 36.0–46.0)
Hemoglobin: 15 g/dL (ref 12.0–15.0)
MCH: 30.1 pg (ref 26.0–34.0)
MCHC: 31.2 g/dL (ref 30.0–36.0)
MCV: 96.6 fL (ref 80.0–100.0)
Platelets: 126 10*3/uL — ABNORMAL LOW (ref 150–400)
RBC: 4.98 MIL/uL (ref 3.87–5.11)
RDW: 13.1 % (ref 11.5–15.5)
WBC: 18.1 10*3/uL — ABNORMAL HIGH (ref 4.0–10.5)
nRBC: 0.1 % (ref 0.0–0.2)

## 2021-11-15 LAB — SURGICAL PATHOLOGY

## 2021-11-15 NOTE — Progress Notes (Signed)
PT Cancellation Note  Patient Details Name: Daisy [name] MRN: [id number] DOB: Apr 29, 1951   Cancelled Treatment:    Reason Eval/Treat Not Completed: Fatigue/lethargy limiting ability to participate;Patient's level of consciousness (Chart reviewed- RN contacted regarding medication for antalgia prior to session. RN reports pt still sleeping, will get pain meds once awake. Will resume PT treatment at later date/time.)   Loraine Bhullar C 11/15/2021, 2:40 PM

## 2021-11-15 NOTE — Progress Notes (Signed)
Daily Progress Note   Patient Name: Daisy [name]       Date: 11/15/2021 DOB: 03-03-1951  Age: 71 y.o. MRN#: [id number] Attending Physician: Ezekiel Slocumb, DO Primary Care Physician: Lesleigh Noe, MD Admit Date: 11/05/2021  Reason for Consultation/Follow-up: Establishing goals of care  Subjective: Patient sleeping, does not wake to voice. Daughter [name] at bedside reports more confusion r/t UTI. Daughter feels pain well controlled on current regimen.   Length of Stay: 9  Current Medications: Scheduled Meds:   aspirin  81 mg Oral Daily   cholecalciferol  1,000 Units Oral Daily   cilostazol  50 mg Oral BID   enoxaparin (LOVENOX) injection  70 mg Subcutaneous Q24H   ezetimibe  10 mg Oral QHS   FLUoxetine  20 mg Oral Daily   metoprolol succinate  12.5 mg Oral Daily   pantoprazole  20 mg Oral Daily   potassium chloride SA  20 mEq Oral BID   sodium chloride flush  3 mL Intravenous Q12H   torsemide  20 mg Oral Daily   cyanocobalamin  1,000 mcg Oral Daily    Continuous Infusions:  sodium chloride     cefTRIAXone (ROCEPHIN)  IV Stopped (11/14/21 1523)    PRN Meds: sodium chloride, acetaminophen, cyclobenzaprine, HYDROcodone-acetaminophen, ipratropium-albuterol, loperamide, LORazepam, ondansetron **OR** ondansetron (ZOFRAN) IV, sodium chloride flush  Physical Exam Constitutional:      General: She is not in acute distress.    Appearance: She is ill-appearing.     Comments: Sleeping, does not wake to voice  Pulmonary:     Effort: Pulmonary effort is normal.  Skin:    General: Skin is warm and dry.            Vital Signs: BP (!) 148/80 (BP Location: Right Arm)   Pulse (!) 108   Temp 98 F (36.7 C) (Oral)   Resp 19   Ht 5\' 7"  (1.702 m)   Wt (!) 141.7 kg   SpO2 91%   BMI  48.93 kg/m  SpO2: SpO2: 91 % O2 Device: O2 Device: Nasal Cannula O2 Flow Rate: O2 Flow Rate (L/min): 2 L/min  Intake/output summary:  Intake/Output Summary (Last 24 hours) at 11/15/2021 1036 Last data filed at 11/14/2021 2359 Gross per 24 hour  Intake 280.05 ml  Output --  Net 280.05 ml   LBM: Last BM Date : 11/14/21 Baseline Weight: Weight: (!) 144.7 kg Most recent weight: Weight: (!) 141.7 kg       Palliative Assessment/Data: PPS 40%    Flowsheet Rows    Flowsheet Row Most Recent Value  Intake Tab   Referral Department Hospitalist  Unit at Time of Referral Med/Surg Unit  Palliative Care Primary Diagnosis Cancer  Date Notified 11/13/21  Palliative Care Type New Palliative care  Reason for referral Clarify Goals of Care  Date of Admission 11/05/21  Date first seen by Palliative Care 11/14/21  # of days Palliative referral response time 1 Day(s)  # of days IP prior to Palliative referral 8  Clinical Assessment   Psychosocial & Spiritual Assessment   Palliative Care Outcomes        Patient Active Problem List   Diagnosis Date Noted  Diarrhea 11/14/2021   Leukocytosis 11/14/2021   Metastatic primary lung cancer ([place]) 11/06/2021   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 11/06/2021   Mass of lower lobe of left lung 11/06/2021   Acute on chronic diastolic CHF (congestive heart failure) ([place]) 10/26/2021   Acute hypoxemic respiratory failure ([place]) 10/26/2021   possible pneumonia 10/26/2021   Pleural effusion on left 10/25/2021   Moderate episode of recurrent major depressive disorder ([place]) 12/24/2020   Generalized anxiety disorder 12/24/2020   Dysuria 03/26/2020   Chronic bilateral low back pain without sciatica 03/26/2020   Well woman exam without gynecological exam 03/26/2020   Chronic pain of both knees 03/26/2020   Edema 01/27/2020   Morbid obesity with BMI of 50.0-59.9, adult ([place]) 01/04/2020   Prediabetes 01/04/2020   HLD (hyperlipidemia)  01/04/2020   Chronic diastolic heart failure ([place]) 01/04/2020   COPD with chronic bronchitis and emphysema ([place]) 11/30/2019   Chronic respiratory failure with hypoxia and hypercapnia ([place]) 11/04/2019   Acute on chronic congestive heart failure ([place]) 10/17/2019   Acute kidney injury superimposed on CKD ([place]) 10/17/2019   Essential hypertension 10/17/2019   Malignant neoplasm of upper-outer quadrant of left breast in female, estrogen receptor positive ([place]) 09/30/2016   PAD (peripheral artery disease) ([place]) 04/04/2014   Obesity hypoventilation syndrome ([place]) 04/04/2014   GERD (gastroesophageal reflux disease) 04/04/2014    Palliative Care Assessment & Plan   HPI: 71 y.o. female  with past medical history of  breast cancer, GERD, arthritis, chronic diastolic congestive heart failure, chronic kidney disease, depression, hyperlipidemia, morbid obesity, and peripheral vascular disease  admitted on 11/05/2021 with back pain.  Found to have T12 compression fracture.  Then found to have massive lung with liver, adrenal and bone mets.  Patient had kyphoplasty done 5/30.  Liver biopsy done 5/30 pending results.  PMT consulted to discuss goals of care.  Assessment: Follow up today with patient and daughter. Daughter [name] at bedside today.  [name] reports more confusion; concern patient has UTI.  Dr. Arbutus Ped ordered urine culture this a.m.  White count continues to trend up. [name] and I reviewed conversation that was had between me, patient and patient's other daughter [name] yesterday.  [name] does not have any questions or concerns.  [name] does want to emphasize that she wants the patient to make decisions for herself regarding how to move forward with her care and at this point she does not feel the patient is able to do that related to UTI but she is hopeful for improvement in mental status in coming days. We also reviewed that patient has been referred to palliative care in the cancer center and in  the community to follow-up at SNF and so goals of care conversations will be ongoing.   We review symptoms, seems that pain is well controlled.  Recommendations/Plan: Ongoing goals of care discussions, patient more confused today ?UTI -unable to participate in goals of care conversations, daughter prefers we defer further conversations until patient is better able to participate Patient has been referred to palliative care follow-up within the cancer center and community-based palliative care  Code Status: Full code  Care plan was discussed with patient's daughter [name]  Thank you for allowing the Palliative Medicine Team to assist in the care of this patient.  *Please note that this is a verbal dictation therefore any spelling or grammatical errors are due to the "[employer]" system interpretation.  Juel Burrow, DNP, AGNP-C Palliative Medicine Team Team Phone # [phone]  Pager [phone]

## 2021-11-15 NOTE — Progress Notes (Signed)
Brain MRI approved Per [employer] Help Auth# [id number] valid 11/15/21-12/15/21 for MRI [phone]

## 2021-11-15 NOTE — TOC Progression Note (Signed)
Transition of Care [facility]) - Progression Note    Patient Details  Name: Daisy [name] MRN: [id number] Date of Birth: 21-Sep-1950  Transition of Care [facility]) CM/SW [place], RN Phone Number: 11/15/2021, 3:02 PM  Clinical Narrative:   Left message for daughters re: SNF placement, awaiting call back  TOC to follow.         Expected Discharge Plan and Services                                                 Social Determinants of Health (SDOH) Interventions    Readmission Risk Interventions    10/29/2021   10:10 AM  Readmission Risk Prevention Plan  Transportation Screening Complete  PCP or Specialist Appt within 5-7 Days Complete  Home Care Screening Complete  Medication Review (RN CM) Referral to Pharmacy

## 2021-11-15 NOTE — Progress Notes (Signed)
Progress Note   Patient: Daisy [name] Daisy [name] DOB: 1950-10-20 DOA: 11/05/2021     9 DOS: the patient was seen and examined on 11/15/2021   Brief hospital course: 71 year old female with past medical history of breast cancer, GERD, arthritis, chronic diastolic congestive heart failure, chronic kidney disease, depression, hyperlipidemia, morbid obesity, peripheral vascular disease presented to the hospital with back pain.  On imaging studies she was diagnosed with a T12 compression fracture.  She was also found to have a mass of the lung with liver metastases, adrenal metastases and osseous metastases.  The patient was seen in consultation by oncology, interventional radiology, neurosurgery and pulmonary.  Kyphoplasty delayed until insurance authorization was received.  Liver biopsy and kyphoplasty done on 11/12/2021.  Palliative care consulted 5/31 at request of patient's daughter.  Initially PT/OT were recommending home health, but due to progressive weakness, now recommend SNF.  TOC following.   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   Status post kyphoplasty by IR on 11/12/2021.   -- Resume aspirin and Pletal --resume Lovenox injections -- Brace when out of bed -- Pain control as needed  Leukocytosis Possibly reactive in setting of compression fracture and procedures including kyphoplasty liver biopsy.  6/1: WBC has been trending up over the past few days, today 17.1.  Patient has been afebrile and no clinical signs or symptoms of infection.  Treated earlier this admission with Flagyl for presumed postobstructive pneumonia by pulmonology. -- Blood culture and UA ordered -- Low threshold to start antibiotics  Mass of lower lobe of left lung Patient has a mass of the lung, liver, adrenals and osseous metastases.  Patient does have a history of breast cancer.  Liver biopsy done 5/30. -- Follow liver biopsy path --  Oncology consulted -- Palliative care consulted  UTI (urinary tract infection) Started Rocephin on evening of 11/14/2021 due to rising WBC and patient report of dysuria.  UA with moderate leukocytes, 21-50 WBCs and rare bacteria -- Continue Rocephin -- Follow urine culture  Acute kidney injury superimposed on CKD ([place]) Acute kidney injury on CKD stage IIIa.  Creatinine improved from 1.48 down to 1.10 even with diuresis with torsemide.    6/1: Cr slightly improved at 1.51 from 1.59 6/2: Creatinine improved 1.18 --Reduced torsemide BID>>daily --Encourage PO hydration --Monitor BMP --Will hold torsemide if renal function worsens  Malignant neoplasm of upper-outer quadrant of left breast in female, estrogen receptor positive (Daisy [name]) Patient with a known history of left breast cancer status post lumpectomy and radiation treatment. Completed a 5 year course of Anastrazole  Diarrhea C. difficile and GI panel were negative on 5/26.  Imodium as needed.  Monitor volume status and electrolytes.  Morbid obesity with BMI of 50.0-59.9, adult (HCC) Body mass index is 53.45 kg/m. Complicates overall care and prognosis.  Recommend lifestyle modifications including physical activity and diet for weight loss and overall long-term health.    Obesity hypoventilation syndrome (HCC) Chronic 2 L of oxygen and BiPAP at night  Generalized anxiety disorder Continue fluoxetine  Essential hypertension Continue metoprolol  COPD with chronic bronchitis and emphysema (HCC) Stable and not acutely exacerbated Continue as needed bronchodilator therapy  Chronic diastolic heart failure (HCC) Stable and not acutely exacerbated.  --Continue metoprolol and torsemide.   --Torsemide reduced to once daily --Monitor volume status        Subjective: Patient awake resting in bed, daughter Daisy [name] is at bedside and seen on rounds today.  Patient  reports feeling okay other than being fatigued.  She was given pain  medicine short while ago and appears to drift off to sleep periodically during the encounter.  Daughter reported patient having hot and cold spells earlier this morning.  No other acute complaints or kickbacks reported  Physical Exam: Vitals:   11/14/21 2018 11/15/21 0500 11/15/21 0559 11/15/21 0809  BP: 129/71  (!) 101/53 (!) 148/80  Pulse: (!) 101  (!) 107 (!) 108  Resp: 18  20 19   Temp: 98.1 F (36.7 C)  97.9 F (36.6 C) 98 F (36.7 C)  TempSrc: Oral  Oral Oral  SpO2: 91%  92% 91%  Weight:  (!) 141.7 kg    Height:       General exam: awake, alert, no acute distress, obese HEENT: Hearing grossly normal, clear conjunctiva, moist mucous membranes Respiratory system: Normal respiratory effort, on 2 L/min  O2 Cardiovascular system: Regular rate and rhythm, no significant peripheral edema.   Central nervous system: Alert and oriented x3, normal speech, grossly nonfocal exam Extremities: No lower extremity edema, moves all extremities Skin: dry, intact, no rashes seen on visualized skin Psychiatry: normal mood, congruent affect, judgement and insight appear normal   Data Reviewed:  Notable labs: Chloride 96, CO2 35, glucose 135, BUN 25, creatinine improved from 1.51 down to 1.18, GFR 49.  WBC up another point at 18.1, platelets down from 156 to 126    Urinalysis yesterday with moderate leukocytes, rare bacteria, 21-50 WBCs with no epi cells's  Family Communication: Daughter Daisy [name] at bedside on rounds today  Disposition: Status is: Inpatient Remains inpatient appropriate because: Ongoing evaluation not appropriate for the outpatient setting.  Requires SNF placement due to profound generalized weakness   Planned Discharge Destination: Skilled nursing facility    Time spent: 35 minutes  Author: Ezekiel Slocumb, DO 11/15/2021 4:39 PM  For on call review [email or web address].

## 2021-11-15 NOTE — Progress Notes (Signed)
Referral received from Dr. Grayland Ormond to help coordinate appts for patient once discharged from the hospital. Pt will need further workup with PET scan and brain MRI. Orders placed at this time and will initiate insurance authorization while awaiting discharge. Pt will need port placed prior to treatment and can be scheduled after discharge. Once pathology resulted, molecular studies will be ordered as well. Will continue to follow.

## 2021-11-15 NOTE — Assessment & Plan Note (Addendum)
Started Rocephin on evening of 11/14/2021 due to rising WBC and patient report of dysuria.  UA with moderate leukocytes, 21-50 WBCs and rare bacteria. Started on empiric Rocephin 6/4: urine culture growing Pseudomonas, changed antibiotic to cefepime 6/5: Urine culture susceptibilities returned, will complete treatment with single dose fosfomycin today.

## 2021-11-16 DIAGNOSIS — N3000 Acute cystitis without hematuria: Secondary | ICD-10-CM | POA: Diagnosis not present

## 2021-11-16 DIAGNOSIS — C3492 Malignant neoplasm of unspecified part of left bronchus or lung: Secondary | ICD-10-CM | POA: Diagnosis not present

## 2021-11-16 DIAGNOSIS — S22080K Wedge compression fracture of T11-T12 vertebra, subsequent encounter for fracture with nonunion: Secondary | ICD-10-CM | POA: Diagnosis not present

## 2021-11-16 LAB — BASIC METABOLIC PANEL
Anion gap: 9 (ref 5–15)
BUN: 24 mg/dL — ABNORMAL HIGH (ref 8–23)
CO2: 36 mmol/L — ABNORMAL HIGH (ref 22–32)
Calcium: 10 mg/dL (ref 8.9–10.3)
Chloride: 94 mmol/L — ABNORMAL LOW (ref 98–111)
Creatinine, Ser: 1.11 mg/dL — ABNORMAL HIGH (ref 0.44–1.00)
GFR, Estimated: 53 mL/min — ABNORMAL LOW (ref >60)
Glucose, Bld: 133 mg/dL — ABNORMAL HIGH (ref 70–99)
Potassium: 4.3 mmol/L (ref 3.5–5.1)
Sodium: 139 mmol/L (ref 135–145)

## 2021-11-16 LAB — CBC
HCT: 46.3 % — ABNORMAL HIGH (ref 36.0–46.0)
Hemoglobin: 14.6 g/dL (ref 12.0–15.0)
MCH: 30.8 pg (ref 26.0–34.0)
MCHC: 31.5 g/dL (ref 30.0–36.0)
MCV: 97.7 fL (ref 80.0–100.0)
Platelets: 129 10*3/uL — ABNORMAL LOW (ref 150–400)
RBC: 4.74 MIL/uL (ref 3.87–5.11)
RDW: 13 % (ref 11.5–15.5)
WBC: 16 10*3/uL — ABNORMAL HIGH (ref 4.0–10.5)
nRBC: 0 % (ref 0.0–0.2)

## 2021-11-16 NOTE — TOC Initial Note (Signed)
Transition of Care [facility]) - Initial/Assessment Note    Patient Details  Name: Daisy [name] MRN: [id number] Date of Birth: 1950/07/14  Transition of Care [facility]) CM/SW Contact:    Raina Mina, [place] Phone Number: 11/16/2021, 12:28 PM  Clinical Narrative: CSW spoke with patients daughter [name] who states she is patients healthcare power of attorney. [name] stated her mother has no choice and will need to go to SNF. [name] stated she prefers [facility] [employer] SNF's expect [employer]. Bed search started                   Expected Discharge Plan: [place] Barriers to Discharge: Continued Medical Work up   Patient Goals and CMS Choice Patient states their goals for this hospitalization and ongoing recovery are:: Return home      Expected Discharge Plan and Services Expected Discharge Plan: [place]                                              Prior Living Arrangements/Services   Lives with:: Adult Children Patient language and need for interpreter reviewed:: Yes Do you feel safe going back to the place where you live?: Yes      Need for Family Participation in Patient Care: Yes (Comment) Care giver support system in place?: Yes (comment)   Criminal Activity/Legal Involvement Pertinent to Current Situation/Hospitalization: No - Comment as needed  Activities of Daily Living Home Assistive Devices/Equipment: Bedside commode/3-in-1, Oxygen, Nebulizer, Walker (specify type) ADL Screening (condition at time of admission) Patient's cognitive ability adequate to safely complete daily activities?: Yes Is the patient deaf or have difficulty hearing?: No Does the patient have difficulty seeing, even when wearing glasses/contacts?: No Does the patient have difficulty concentrating, remembering, or making decisions?: No Patient able to express need for assistance with ADLs?: Yes Does the patient have difficulty  dressing or bathing?: No Independently performs ADLs?: No Communication: Independent Dressing (OT): Needs assistance Is this a change from baseline?: Pre-admission baseline Grooming: Independent Feeding: Independent Bathing: Independent Toileting: Needs assistance Is this a change from baseline?: Pre-admission baseline In/Out Bed: Needs assistance Is this a change from baseline?: Pre-admission baseline Walks in Home: Needs assistance Is this a change from baseline?: Pre-admission baseline Does the patient have difficulty walking or climbing stairs?: Yes Weakness of Legs: Both Weakness of Arms/Hands: None  Permission Sought/Granted Permission sought to share information with : Facility Art therapist granted to share information with : Yes, Release of Information Signed  Share Information with NAME: [name]     Permission granted to share info w Relationship: Daughter  Permission granted to share info w Contact Information: [phone]  Emotional Assessment Appearance:: Appears stated age     Orientation: : Oriented to Self, Oriented to Place Alcohol / Substance Use: Not Applicable Psych Involvement: No (comment)  Admission diagnosis:  Metastatic primary lung cancer ([place]) [C34.90] Thoracic compression fracture, closed, initial encounter ([place]) [S22.000A] Primary malignant neoplasm of left lung metastatic to other site [facility]) [C34.92] Diarrhea, unspecified type [R19.7] Patient Active Problem List   Diagnosis Date Noted   Diarrhea 11/14/2021   Leukocytosis 11/14/2021   Metastatic primary lung cancer ([place]) 11/06/2021   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 11/06/2021   Mass of lower lobe of left lung 11/06/2021   Acute on chronic diastolic CHF (congestive heart failure) (  [place]) 10/26/2021   Acute hypoxemic respiratory failure ([place]) 10/26/2021   possible pneumonia 10/26/2021   Pleural effusion on left 10/25/2021   Moderate episode of  recurrent major depressive disorder ([place]) 12/24/2020   Generalized anxiety disorder 12/24/2020   Dysuria 03/26/2020   Chronic bilateral low back pain without sciatica 03/26/2020   Well woman exam without gynecological exam 03/26/2020   Chronic pain of both knees 03/26/2020   Edema 01/27/2020   Morbid obesity with BMI of 50.0-59.9, adult ([place]) 01/04/2020   Prediabetes 01/04/2020   HLD (hyperlipidemia) 01/04/2020   Chronic diastolic heart failure ([place]) 01/04/2020   COPD with chronic bronchitis and emphysema ([place]) 11/30/2019   Chronic respiratory failure with hypoxia and hypercapnia ([place]) 11/04/2019   Acute on chronic congestive heart failure ([place]) 10/17/2019   Acute kidney injury superimposed on CKD ([place]) 10/17/2019   Essential hypertension 10/17/2019   UTI (urinary tract infection)    Malignant neoplasm of upper-outer quadrant of left breast in female, estrogen receptor positive ([place]) 09/30/2016   PAD (peripheral artery disease) ([place]) 04/04/2014   Obesity hypoventilation syndrome ([place]) 04/04/2014   GERD (gastroesophageal reflux disease) 04/04/2014   PCP:  Lesleigh Noe, MD Pharmacy:   [id number] [place], [place] - [address] [place] [address] Phone: [phone] Fax: [phone]  [facility] [address] [place] [address] Phone: [phone] Fax: [phone]     Social Determinants of Health (SDOH) Interventions    Readmission Risk Interventions    10/29/2021   10:10 AM  Readmission Risk Prevention Plan  Transportation Screening Complete  PCP or Specialist Appt within 5-7 Days Complete  Home Care Screening Complete  Medication Review (RN CM) Referral to Pharmacy

## 2021-11-16 NOTE — Progress Notes (Signed)
Progress Note   Patient: Daisy [name] TZG:[id number] DOB: 08-24-1950 DOA: 11/05/2021     10 DOS: the patient was seen and examined on 11/16/2021   Brief hospital course: 71 year old female with past medical history of breast cancer, GERD, arthritis, chronic diastolic congestive heart failure, chronic kidney disease, depression, hyperlipidemia, morbid obesity, peripheral vascular disease presented to the hospital with back pain.  On imaging studies she was diagnosed with a T12 compression fracture.  She was also found to have a mass of the lung with liver metastases, adrenal metastases and osseous metastases.  The patient was seen in consultation by oncology, interventional radiology, neurosurgery and pulmonary.  Kyphoplasty delayed until insurance authorization was received.  Liver biopsy and kyphoplasty done on 11/12/2021.  Palliative care consulted 5/31 at request of patient's daughter.  Initially PT/OT were recommending home health, but due to progressive weakness, now recommend SNF.  TOC following.   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   Status post kyphoplasty by IR on 11/12/2021.   -- Resume aspirin and Pletal --resume Lovenox injections -- Brace when out of bed -- Pain control as needed  Leukocytosis Possibly reactive in setting of compression fracture and procedures including kyphoplasty liver biopsy.  6/1: WBC has been trending up over the past few days, today 17.1.  Patient has been afebrile and no clinical signs or symptoms of infection.  Treated earlier this admission with Flagyl for presumed postobstructive pneumonia by pulmonology. -- Blood culture and UA ordered -- Low threshold to start antibiotics  Metastatic primary lung cancer, left [facility]) Patient has a mass of the lung, liver, adrenals and osseous metastases.  Patient does have a history of breast cancer. Follows with Dr. Grayland [name].   5/30 Liver and  T12 bone biopsies obtained.  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 -- Oncology consulted -- Palliative care consulted -- MRI brain pending -- PET scan as outpatient  UTI (urinary tract infection) Started Rocephin on evening of 11/14/2021 due to rising WBC and patient report of dysuria.  UA with moderate leukocytes, 21-50 WBCs and rare bacteria. -- Continue Rocephin -- Follow urine culture - growing GNR's  Leukocytosis is improving, still with waxing and waning confusion  Acute kidney injury superimposed on CKD ([place]) Acute kidney injury on CKD stage IIIa.  Creatinine improved from 1.48 down to 1.10 even with diuresis with torsemide.    6/1: Cr slightly improved at 1.51 from 1.59 6/2: Creatinine improved 1.18 --Reduced torsemide BID>>daily --Encourage PO hydration --Monitor BMP --Will hold torsemide if renal function worsens  Malignant neoplasm of upper-outer quadrant of left breast in female, estrogen receptor positive ([place]) Patient with a known history of left breast cancer status post lumpectomy and radiation treatment. Completed a 5 year course of Anastrazole  Diarrhea C. difficile and GI panel were negative on 5/26.  Imodium as needed.  Monitor volume status and electrolytes.  Morbid obesity with BMI of 50.0-59.9, adult (HCC) Body mass index is 53.45 kg/m. Complicates overall care and prognosis.  Recommend lifestyle modifications including physical activity and diet for weight loss and overall long-term health.    Obesity hypoventilation syndrome (HCC) Chronic 2 L of oxygen and BiPAP at night  Generalized anxiety disorder Continue fluoxetine  Essential hypertension Continue metoprolol  COPD with chronic bronchitis and emphysema (HCC) Stable and not acutely exacerbated Continue as needed bronchodilator therapy  Chronic diastolic heart failure (HCC) Stable and not acutely exacerbated.  --Continue metoprolol and  torsemide.   --Torsemide reduced to once daily --Monitor volume status        Subjective: Patient awake resting in bed when seen on rounds today.  Her daughters have not arrived yet this morning.  RN at bedside.  Discussed the results of her liver and bone biopsies showing metastatic primary lung cancer, confirming our suspicions.  Patient still has waxing and waning episodes of confusion per RN, daughters have continued reporting this is well.  She denies fevers chills or any other acute complaints.  Physical Exam: Vitals:   11/16/21 0811 11/16/21 1142 11/16/21 1149 11/16/21 1150  BP: 136/74 112/63    Pulse: (!) 104 (!) 112 (!) 114 (!) 108  Resp: 18 20    Temp: 98.1 F (36.7 C) 97.7 F (36.5 C)    TempSrc:      SpO2: 92% 92% 94% 96%  Weight:      Height:       General exam: awake, alert, no acute distress, obese HEENT: Hearing grossly normal, clear conjunctiva, moist mucous membranes Respiratory system: Normal respiratory effort, on 2 L/min [place] O2, lungs clear without wheezes rhonchi, left base diminished Cardiovascular system: Regular rate and rhythm, no significant peripheral edema.   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  Disposition: Status is: Inpatient Remains inpatient appropriate because: Ongoing evaluation not appropriate for the outpatient setting.  Requires SNF placement due to profound generalized weakness   Planned Discharge Destination: Skilled nursing facility    Time spent: 35 minutes  Author: Ezekiel Slocumb, DO 11/16/2021 2:00 PM  For on call review [email or web address].

## 2021-11-16 NOTE — NC FL2 (Signed)
[place] LEVEL OF CARE SCREENING TOOL     IDENTIFICATION  Patient Name: Daisy [name] Birthdate: [birth year] Sex: female Admission Date (Current Location): 11/05/2021  [place] and [place] Number:  Engineering geologist and Address:  [facility], [address] [place], [place] [address]      Provider Number: [id number]  Attending Physician Name and Address:  Ezekiel Slocumb, DO  Relative Name and Phone Number:  [name] (Daughter)   [phone]    Current Level of Care: Hospital Recommended Level of Care: [place] Prior Approval Number:    Date Approved/Denied:   PASRR Number: [id number]  Discharge Plan: SNF    Current Diagnoses: Patient Active Problem List   Diagnosis Date Noted   Diarrhea 11/14/2021   Leukocytosis 11/14/2021   Metastatic primary lung cancer ([place]) 11/06/2021   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 11/06/2021   Mass of lower lobe of left lung 11/06/2021   Acute on chronic diastolic CHF (congestive heart failure) ([place]) 10/26/2021   Acute hypoxemic respiratory failure ([place]) 10/26/2021   possible pneumonia 10/26/2021   Pleural effusion on left 10/25/2021   Moderate episode of recurrent major depressive disorder ([place]) 12/24/2020   Generalized anxiety disorder 12/24/2020   Dysuria 03/26/2020   Chronic bilateral low back pain without sciatica 03/26/2020   Well woman exam without gynecological exam 03/26/2020   Chronic pain of both knees 03/26/2020   Edema 01/27/2020   Morbid obesity with BMI of 50.0-59.9, adult ([place]) 01/04/2020   Prediabetes 01/04/2020   HLD (hyperlipidemia) 01/04/2020   Chronic diastolic heart failure ([place]) 01/04/2020   COPD with chronic bronchitis and emphysema ([place]) 11/30/2019   Chronic respiratory failure with hypoxia and hypercapnia ([place]) 11/04/2019   Acute on chronic congestive heart failure ([place]) 10/17/2019   Acute kidney injury superimposed  on CKD ([place]) 10/17/2019   Essential hypertension 10/17/2019   UTI (urinary tract infection)    Malignant neoplasm of upper-outer quadrant of left breast in female, estrogen receptor positive ([place]) 09/30/2016   PAD (peripheral artery disease) ([place]) 04/04/2014   Obesity hypoventilation syndrome ([place]) 04/04/2014   GERD (gastroesophageal reflux disease) 04/04/2014    Orientation RESPIRATION BLADDER Height & Weight     Self, Place  O2 (2 Liters of oxygen) Incontinent Weight: (!) 312 lb 6.3 oz (141.7 kg) Height:  5\' 7"  (170.2 cm)  BEHAVIORAL SYMPTOMS/MOOD NEUROLOGICAL BOWEL NUTRITION STATUS      Incontinent Diet (Low sodium/Heart healthy)  AMBULATORY STATUS COMMUNICATION OF NEEDS Skin   Extensive Assist Verbally Normal                       Personal Care Assistance Level of Assistance  Bathing, Feeding, Dressing Bathing Assistance: Maximum assistance Feeding assistance: Limited assistance Dressing Assistance: Maximum assistance     Functional Limitations Info  Sight, Hearing, Speech Sight Info: Impaired Hearing Info: Adequate Speech Info: Adequate    SPECIAL CARE FACTORS FREQUENCY  PT (By licensed PT), OT (By licensed OT)     PT Frequency: 7Xmin weekly OT Frequency: 2Xmin weekly            Contractures Contractures Info: Not present    Additional Factors Info  Code Status, Allergies Code Status Info: Full Allergies Info: Elemental Sulfur, Levaquin (levofloxacin), Statins,           Current Medications (11/16/2021):  This is the current hospital active medication list Current Facility-Administered Medications  Medication Dose Route Frequency Provider Last Rate Last  Admin   0.9 %  sodium chloride infusion  250 mL Intravenous PRN Agbata, Tochukwu, MD       acetaminophen (TYLENOL) tablet 650 mg  650 mg Oral Q4H PRN Agbata, Tochukwu, MD   650 mg at 11/13/21 1048   aspirin chewable tablet 81 mg  81 mg Oral Daily Nicole Kindred A, DO   81 mg at 11/16/21 1040    cefTRIAXone (ROCEPHIN) 1 g in sodium chloride 0.9 % 100 mL IVPB  1 g Intravenous Q24H Nicole Kindred A, DO 200 mL/hr at 11/15/21 1447 1 g at 11/15/21 1447   cholecalciferol (VITAMIN D3) tablet 1,000 Units  1,000 Units Oral Daily Agbata, Tochukwu, MD   1,000 Units at 11/16/21 1039   cilostazol (PLETAL) tablet 50 mg  50 mg Oral BID Nicole Kindred A, DO   50 mg at 11/16/21 1040   cyclobenzaprine (FLEXERIL) tablet 5 mg  5 mg Oral TID PRN Agbata, Tochukwu, MD   5 mg at 11/14/21 2027   enoxaparin (LOVENOX) injection 70 mg  70 mg Subcutaneous Q24H Rito Ehrlich A, RPH   70 mg at 11/16/21 1041   ezetimibe (ZETIA) tablet 10 mg  10 mg Oral QHS Wieting, Richard, MD   10 mg at 11/15/21 2155   FLUoxetine (PROZAC) capsule 20 mg  20 mg Oral Daily Agbata, Tochukwu, MD   20 mg at 11/16/21 1041   HYDROcodone-acetaminophen (NORCO/VICODIN) 5-325 MG per tablet 1 tablet  1 tablet Oral Q4H PRN Agbata, Tochukwu, MD   1 tablet at 11/15/21 1748   ipratropium-albuterol (DUONEB) 0.5-2.5 (3) MG/3ML nebulizer solution 3 mL  3 mL Nebulization Q6H PRN Agbata, Tochukwu, MD       loperamide (IMODIUM) capsule 2 mg  2 mg Oral PRN Nicole Kindred A, DO       LORazepam (ATIVAN) tablet 0.5 mg  0.5 mg Oral Q4H PRN Merlyn Lot, MD   0.5 mg at 11/06/21 1342   metoprolol succinate (TOPROL-XL) 24 hr tablet 12.5 mg  12.5 mg Oral Daily Agbata, Tochukwu, MD   12.5 mg at 11/16/21 1039   ondansetron (ZOFRAN) tablet 4 mg  4 mg Oral Q6H PRN Agbata, Tochukwu, MD       Or   ondansetron (ZOFRAN) injection 4 mg  4 mg Intravenous Q6H PRN Agbata, Tochukwu, MD       pantoprazole (PROTONIX) EC tablet 20 mg  20 mg Oral Daily Agbata, Tochukwu, MD   20 mg at 11/16/21 1040   potassium chloride SA (KLOR-CON M) CR tablet 20 mEq  20 mEq Oral BID Wieting, Richard, MD   20 mEq at 11/16/21 1039   sodium chloride flush (NS) 0.9 % injection 3 mL  3 mL Intravenous Q12H Agbata, Tochukwu, MD   3 mL at 11/16/21 1041   sodium chloride flush (NS) 0.9 % injection 3  mL  3 mL Intravenous PRN Agbata, Tochukwu, MD       torsemide (DEMADEX) tablet 20 mg  20 mg Oral Daily Loletha Grayer, MD   20 mg at 11/16/21 1041   vitamin B-12 (CYANOCOBALAMIN) tablet 1,000 mcg  1,000 mcg Oral Daily Agbata, Tochukwu, MD   1,000 mcg at 11/16/21 1040     Discharge Medications: Please see discharge summary for a list of discharge medications.  Relevant Imaging Results:  Relevant Lab Results:   Additional Information [id number]. Patient has Bipap machine at home and wear it at night.  Raina Mina, LCSWA

## 2021-11-16 NOTE — Progress Notes (Signed)
Physical Therapy Treatment Patient Details Name: Daisy [name] MRN: [id number] DOB: 07/06/1950 Today's Date: 11/16/2021   History of Present Illness Daisy [name] is a 18yoF PMH: morbid obesity, OSA on BiPAP+O2, CRF O2 prn, PVD, CKD2, BrCA s/p lumpectomy and radiation therapy, dCHF, COPD. Pt was recently discharged from the hospital after treatment for acute on chronic hypoxic and hypercapnic respiratory failure secondary to CHF who presents to the emergency room for evaluation of abdominal pain mostly in the left lower quadrant associated with diarrhea. 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 Pt went to procedure on 5/30 with IR for RFA and vertebroplasty at T12.    PT Comments    Pt was long sitting in bed with RN in room. She is alert but only oriented x 2. Clearly confused but is able to follow commands throughout. Pt was able to roll L to short sit EOB with min assist + a lot of vcs. Daughter arrived during session and continues to voice concerns about  cognitive state. She required a lot of assistance to properly place TLSO brace. Once brace was applied, pt was able to stand and ambulate in room. Poor gait posture throughout. Pt gets SOB however sao2 > 90n% on 2 L. She was sitting in recliner post session with call bell in reach and daughter at bedside. RN contacted MD about cognition concerns. PT continues to recommend DC to SNF until pt is safe and more independent.    Recommendations for follow up therapy are one component of a multi-disciplinary discharge planning process, led by the attending physician.  Recommendations may be updated based on patient status, additional functional criteria and insurance authorization.  Follow Up Recommendations  Skilled nursing-short term rehab (<3 hours/day)     Assistance Recommended at Discharge Set up Supervision/Assistance  Patient can return home with the following Assistance  with cooking/housework;Assist for transportation;Help with stairs or ramp for entrance;Two people to help with walking and/or transfers;Two people to help with bathing/dressing/bathroom   Equipment Recommendations  Other (comment) (Bariatric RW)       Precautions / Restrictions Precautions Precautions: Fall;Back Precaution Comments: watch O2 Required Braces or Orthoses: Spinal Brace Spinal Brace: Thoracolumbosacral orthotic Restrictions Weight Bearing Restrictions: No     Mobility  Bed Mobility Overal bed mobility: Needs Assistance Bed Mobility: Supine to Sit, Rolling, Sidelying to Sit Rolling: Min assist Sidelying to sit: Min guard (HOB elevated ~ 20degrees) Supine to sit: Min assist     General bed mobility comments: min assist to progress form supine to EOB short sit. VC+TCs for technqiue and sequencing    Transfers Overall transfer level: Needs assistance Equipment used: Rolling walker (2 wheels) Transfers: Sit to/from Stand Sit to Stand: Min guard    General transfer comment: Chief Strategy Officer assisted pt with proper placement of TLSO. ask daughter if she feels comfortable in proper placement of TLSO and she responds "yes". Pt is impulsive and needs vcs to slow down a little. Cognition limits pt more so than physical impairments.    Ambulation/Gait Ambulation/Gait assistance: Min guard Gait Distance (Feet): 50 Feet Assistive device: Rolling walker (2 wheels) Gait Pattern/deviations: Step-through pattern, Trunk flexed, Decreased step length - right, Decreased step length - left Gait velocity: decreased     General Gait Details: pt ambulated 2 lap in her room with very poor posture. vcs throughout for posture correction and to slow down. Sao2 > 90% on 2 L o2    Balance Overall balance  assessment: Needs assistance Sitting-balance support: Feet supported, No upper extremity supported Sitting balance-Leahy Scale: Good     Standing balance support: Bilateral upper extremity  supported, During functional activity, Reliant on assistive device for balance Standing balance-Leahy Scale: Fair Standing balance comment: reliant on UE support       Cognition Arousal/Alertness: Awake/alert Behavior During Therapy: Impulsive Overall Cognitive Status: Impaired/Different from baseline Area of Impairment: Safety/judgement, Attention, Following commands, Awareness, Problem solving      Current Attention Level: Selective   Following Commands: Follows one step commands with increased time Safety/Judgement: Decreased awareness of safety, Decreased awareness of deficits Awareness: Intellectual Problem Solving: Slow processing General Comments: Pt is alert and oriented x 2 but clearly confused. Throughout session, pt make off the wall remarks. Daughter entered room and voices concerns that cognition seems to be "getting worse." RN aware and messaged MD               Pertinent Vitals/Pain Pain Assessment Pain Assessment: No/denies pain Pain Score: 0-No pain Pain Location: back Pain Descriptors / Indicators: Sore, Aching Pain Intervention(s): Limited activity within patient's tolerance, Monitored during session, Premedicated before session, Ice applied     PT Goals (current goals can now be found in the care plan section) Acute Rehab PT Goals Patient Stated Goal: none stated Progress towards PT goals: Progressing toward goals    Frequency    7X/week      PT Plan Current plan remains appropriate       AM-PAC PT "6 Clicks" Mobility   Outcome Measure  Help needed turning from your back to your side while in a flat bed without using bedrails?: A Little Help needed moving from lying on your back to sitting on the side of a flat bed without using bedrails?: A Lot Help needed moving to and from a bed to a chair (including a wheelchair)?: A Lot Help needed standing up from a chair using your arms (e.g., wheelchair or bedside chair)?: A Lot Help needed to walk  in hospital room?: A Lot Help needed climbing 3-5 steps with a railing? : A Lot 6 Click Score: 13    End of Session Equipment Utilized During Treatment: Oxygen (2 L) Activity Tolerance: Patient tolerated treatment well;Other (comment) (limited by fatigue and cognition) Patient left: in chair;with call bell/phone within reach;with chair alarm set;with family/visitor present Nurse Communication: Mobility status PT Visit Diagnosis: Difficulty in walking, not elsewhere classified (R26.2);Muscle weakness (generalized) (M62.81);Other abnormalities of gait and mobility (R26.89)     Time: 3790-2409 PT Time Calculation (min) (ACUTE ONLY): 16 min  Charges:  $Therapeutic Activity: 8-22 mins                    Julaine Fusi PTA 11/16/21, 11:39 AM

## 2021-11-17 ENCOUNTER — Encounter: Payer: Self-pay | Admitting: Radiology

## 2021-11-17 ENCOUNTER — Inpatient Hospital Stay: Payer: Medicare PPO

## 2021-11-17 DIAGNOSIS — S22080K Wedge compression fracture of T11-T12 vertebra, subsequent encounter for fracture with nonunion: Secondary | ICD-10-CM | POA: Diagnosis not present

## 2021-11-17 DIAGNOSIS — G9341 Metabolic encephalopathy: Secondary | ICD-10-CM | POA: Clinically undetermined

## 2021-11-17 LAB — CBC
HCT: 44.3 % (ref 36.0–46.0)
Hemoglobin: 13.8 g/dL (ref 12.0–15.0)
MCH: 30.3 pg (ref 26.0–34.0)
MCHC: 31.2 g/dL (ref 30.0–36.0)
MCV: 97.4 fL (ref 80.0–100.0)
Platelets: 125 10*3/uL — ABNORMAL LOW (ref 150–400)
RBC: 4.55 MIL/uL (ref 3.87–5.11)
RDW: 13.2 % (ref 11.5–15.5)
WBC: 14.4 10*3/uL — ABNORMAL HIGH (ref 4.0–10.5)
nRBC: 0 % (ref 0.0–0.2)

## 2021-11-17 MED ORDER — SODIUM CHLORIDE 0.9 % IV SOLN
2.0000 g | Freq: Two times a day (BID) | INTRAVENOUS | Status: DC
  Administered 2021-11-17 – 2021-11-18 (×3): 2 g via INTRAVENOUS
  Filled 2021-11-17: qty 2
  Filled 2021-11-17 (×2): qty 12.5
  Filled 2021-11-17: qty 2

## 2021-11-17 MED ORDER — GADOBUTROL 1 MMOL/ML IV SOLN
10.0000 mL | Freq: Once | INTRAVENOUS | Status: AC | PRN
  Administered 2021-11-17: 10 mL via INTRAVENOUS

## 2021-11-17 NOTE — Progress Notes (Signed)
Progress Note   Patient: Daisy [name] GBT:[id number] DOB: 1951/03/15 DOA: 11/05/2021     11 DOS: the patient was seen and examined on 11/17/2021   Brief hospital course: 71 year old female with past medical history of breast cancer, GERD, arthritis, chronic diastolic congestive heart failure, chronic kidney disease, depression, hyperlipidemia, morbid obesity, peripheral vascular disease presented to the hospital with back pain.  On imaging studies she was diagnosed with a T12 compression fracture.  She was also found to have a mass of the lung with liver metastases, adrenal metastases and osseous metastases.  The patient was seen in consultation by oncology, interventional radiology, neurosurgery and pulmonary.  Kyphoplasty delayed until insurance authorization was received.  Liver biopsy and kyphoplasty done on 11/12/2021.  Palliative care consulted 5/31 at request of patient's daughter.  Initially PT/OT were recommending home health, but due to progressive weakness, now recommend SNF.  TOC following.   Hospital course complicated by UTI.  Started on Rocephin, cultures later grew Pseudomonas, changed antibiotic to Cefepime on 6/4.  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   Status post kyphoplasty by IR on 11/12/2021.   -- Resume aspirin and Pletal --resume Lovenox injections -- Brace when out of bed -- Pain control as needed  Leukocytosis Possibly reactive in setting of compression fracture and procedures including kyphoplasty liver biopsy.  6/4: White count improving since antibiotics started for UTI -- Follow blood culture, no growth to date -- Antibiotic changed to cefepime 6/4 due to Pseudomonas on urine culture  Metastatic primary lung cancer, left [facility]) Patient has a mass of the lung, liver, adrenals and osseous metastases.  Patient does have a history of breast cancer. Follows with Dr. Grayland Ormond.   5/30 Liver and  T12 bone biopsies obtained.  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 -- Oncology consulted -- Palliative care consulted -- MRI brain with without pending, ordering inpatient given patient's mental status changes -- PET scan as outpatient  UTI (urinary tract infection) Started Rocephin on evening of 11/14/2021 due to rising WBC and patient report of dysuria.  UA with moderate leukocytes, 21-50 WBCs and rare bacteria. 6/4: urine culture growing Pseudomonas -- Started on empiric Rocephin -- Change antibiotic to cefepime -- Follow culture for susceptibilities    Acute metabolic encephalopathy Suspect this is due to UTI.  Patient has waxing and waning episodes of confusion.  Daughters reports she is cognitively sharp at baseline, no memory issues. -- Treating UTI as outlined -- MRI brain ordered as outpatient for oncology staging, will get this done inpatient -- Delirium precautions  Acute kidney injury superimposed on CKD ([place]) Acute kidney injury on CKD stage IIIa.  Creatinine improved from 1.48 down to 1.10 even with diuresis with torsemide.   6/3: creatinine improved 1.11  --Reduced torsemide BID>>daily --Monitor volume status closely --Encourage PO hydration --Monitor BMP  Malignant neoplasm of upper-outer quadrant of left breast in female, estrogen receptor positive ([place]) Patient with a known history of left breast cancer status post lumpectomy and radiation treatment. Completed a 5 year course of Anastrazole  Diarrhea C. difficile and GI panel were negative on 5/26.  Imodium as needed.  Monitor volume status and electrolytes.  Morbid obesity with BMI of 50.0-59.9, adult (HCC) Body mass index is 53.45 kg/m. Complicates overall care and prognosis.  Recommend lifestyle modifications including physical activity and diet for weight loss and overall long-term health.    Obesity hypoventilation syndrome (HCC) Chronic 2  L of  oxygen and BiPAP at night  Generalized anxiety disorder Continue fluoxetine  Essential hypertension Continue metoprolol  COPD with chronic bronchitis and emphysema (HCC) Stable and not acutely exacerbated Continue as needed bronchodilator therapy  Chronic diastolic heart failure (HCC) Stable and not acutely exacerbated.  --Continue metoprolol and torsemide.   --Torsemide reduced to once daily --Monitor volume status        Subjective: Patient awake resting in bed, daughter [name] at bedside, daughter [name] joined on speaker phone during encounter.  We discussed urine culture and change in antibiotic this morning.  Patient reportedly feeling better, still does have bouts of confusion that are abnormal for her.  She otherwise reports feeling well overall.  Says her back hurts but overall pain fairly well controlled.  No other acute events reported.  Physical Exam: Vitals:   11/16/21 2027 11/17/21 0500 11/17/21 0517 11/17/21 0821  BP: 131/68  115/65 129/73  Pulse: (!) 101  100 (!) 108  Resp: 20  20 19   Temp: 97.9 F (36.6 C)  98.2 F (36.8 C) 97.9 F (36.6 C)  TempSrc: Oral  Oral Oral  SpO2: 93%  93% 93%  Weight:  (!) 140.7 kg    Height:       General exam: awake resting in bed, alert, no acute distress, obese HEENT: Hearing grossly normal, moist mucous membranes Respiratory system: Lungs clear bilaterally with diminished bases, normal respiratory effort Cardiovascular system: RRR, no peripheral edema.   Central nervous system: Grossly nonfocal exam, alert and oriented x3, normal speech Extremities: Moves all, no significant peripheral edema Skin: dry, intact, scattered patches of ecchymosis, no rashes seen on visualized skin Psychiatry: normal mood, congruent affect, judgement and insight appear normal   Data Reviewed:  Notable labs: White count improved to 14.4, platelets down just slightly 125 from 129    Urine culture growing Pseudomonas, susceptibilities still  pending   Family Communication: Daughter [name] at bedside today, daughter [name] on speaker phone during encounter  Disposition: Status is: Inpatient Remains inpatient appropriate because: Ongoing evaluation not appropriate for the outpatient setting.  Requires SNF placement due to profound generalized weakness   Planned Discharge Destination: Skilled nursing facility    Time spent: 35 minutes  Author: Ezekiel Slocumb, DO 11/17/2021 2:01 PM  For on call review [email or web address].

## 2021-11-17 NOTE — Progress Notes (Signed)
IV consult for new IV due to bloody dressing.  Secure chat with Alric Quan RN re need to change IV dressing prior to attempting new IV placement.

## 2021-11-17 NOTE — Progress Notes (Addendum)
PT Cancellation Note  Patient Details Name: Daisy [name] MRN: [id number] DOB: 20-Feb-1951   Cancelled Treatment:    Reason Eval/Treat Not Completed: Fatigue/lethargy limiting ability to participate (Chart reviewed, treatment attempt. Pt sleeping, asks to continue to rest, do PT later. Will attempt services again at later date/time.)  2:56 PM, 11/17/21 Etta Grandchild, PT, DPT Physical Therapist - [facility]  [phone] ([place])    Harris C 11/17/2021, 2:56 PM

## 2021-11-17 NOTE — Assessment & Plan Note (Addendum)
Suspect this is due to UTI.  Patient has waxing and waning episodes of confusion.  Daughters reports she is cognitively sharp at baseline, no memory issues. -- Treating UTI as outlined -- MRI brain ordered as outpatient for oncology staging, will get this done inpatient -- Delirium precautions

## 2021-11-18 ENCOUNTER — Encounter: Payer: Self-pay | Admitting: *Deleted

## 2021-11-18 DIAGNOSIS — Z7189 Other specified counseling: Secondary | ICD-10-CM | POA: Diagnosis not present

## 2021-11-18 DIAGNOSIS — C7951 Secondary malignant neoplasm of bone: Secondary | ICD-10-CM

## 2021-11-18 DIAGNOSIS — S22080K Wedge compression fracture of T11-T12 vertebra, subsequent encounter for fracture with nonunion: Secondary | ICD-10-CM | POA: Diagnosis not present

## 2021-11-18 DIAGNOSIS — Z515 Encounter for palliative care: Secondary | ICD-10-CM

## 2021-11-18 DIAGNOSIS — C7931 Secondary malignant neoplasm of brain: Secondary | ICD-10-CM

## 2021-11-18 DIAGNOSIS — Z66 Do not resuscitate: Secondary | ICD-10-CM | POA: Diagnosis not present

## 2021-11-18 LAB — CBC
HCT: 44.2 % (ref 36.0–46.0)
Hemoglobin: 13.6 g/dL (ref 12.0–15.0)
MCH: 30 pg (ref 26.0–34.0)
MCHC: 30.8 g/dL (ref 30.0–36.0)
MCV: 97.6 fL (ref 80.0–100.0)
Platelets: 141 10*3/uL — ABNORMAL LOW (ref 150–400)
RBC: 4.53 MIL/uL (ref 3.87–5.11)
RDW: 13.2 % (ref 11.5–15.5)
WBC: 13 10*3/uL — ABNORMAL HIGH (ref 4.0–10.5)
nRBC: 0.2 % (ref 0.0–0.2)

## 2021-11-18 LAB — BASIC METABOLIC PANEL
Anion gap: 10 (ref 5–15)
BUN: 25 mg/dL — ABNORMAL HIGH (ref 8–23)
CO2: 33 mmol/L — ABNORMAL HIGH (ref 22–32)
Calcium: 9.4 mg/dL (ref 8.9–10.3)
Chloride: 98 mmol/L (ref 98–111)
Creatinine, Ser: 1.34 mg/dL — ABNORMAL HIGH (ref 0.44–1.00)
GFR, Estimated: 42 mL/min — ABNORMAL LOW (ref >60)
Glucose, Bld: 111 mg/dL — ABNORMAL HIGH (ref 70–99)
Potassium: 3.8 mmol/L (ref 3.5–5.1)
Sodium: 141 mmol/L (ref 135–145)

## 2021-11-18 LAB — URINE CULTURE: Culture: >=100000 — AB

## 2021-11-18 LAB — MAGNESIUM: Magnesium: 2.2 mg/dL (ref 1.7–2.4)

## 2021-11-18 MED ORDER — MORPHINE SULFATE 10 MG/5ML PO SOLN: 2.5000 mg | ORAL | Status: DC | PRN

## 2021-11-18 MED ORDER — FOSFOMYCIN TROMETHAMINE 3 G PO PACK
3.0000 g | PACK | Freq: Once | ORAL | Status: AC
  Administered 2021-11-18: 3 g via ORAL
  Filled 2021-11-18: qty 3

## 2021-11-18 MED ORDER — DEXAMETHASONE SODIUM PHOSPHATE 10 MG/ML IJ SOLN
INTRAMUSCULAR | Status: AC
  Filled 2021-11-18: qty 1

## 2021-11-18 MED ORDER — DEXAMETHASONE SODIUM PHOSPHATE 10 MG/ML IJ SOLN
10.0000 mg | Freq: Once | INTRAMUSCULAR | Status: AC
  Administered 2021-11-18: 10 mg via INTRAVENOUS

## 2021-11-18 MED ORDER — LORAZEPAM 2 MG/ML IJ SOLN: 0.5000 mg | Freq: Four times a day (QID) | INTRAMUSCULAR | Status: DC | PRN

## 2021-11-18 MED ORDER — DEXAMETHASONE 4 MG PO TABS
2.0000 mg | ORAL_TABLET | Freq: Every day | ORAL | Status: DC
  Administered 2021-11-19: 2 mg via ORAL
  Filled 2021-11-18: qty 1

## 2021-11-18 MED ORDER — SENNA 8.6 MG PO TABS
1.0000 | ORAL_TABLET | Freq: Every day | ORAL | Status: DC
  Administered 2021-11-18: 8.6 mg via ORAL
  Filled 2021-11-18: qty 1

## 2021-11-18 MED ORDER — LORAZEPAM 1 MG PO TABS
1.0000 mg | ORAL_TABLET | ORAL | Status: DC | PRN
  Administered 2021-11-18 (×2): 1 mg via ORAL
  Filled 2021-11-18 (×2): qty 1

## 2021-11-18 NOTE — Progress Notes (Signed)
Occupational Therapy Treatment Patient Details Name: Daisy [name] MRN: [id number] DOB: [birth year] Today's Date: 11/18/2021   History of present illness Briona Korpela is a 74yoF PMH: morbid obesity, OSA on BiPAP+O2, CRF O2 prn, PVD, CKD2, BrCA s/p lumpectomy and radiation therapy, dCHF, COPD. Pt was recently discharged from the hospital after treatment for acute on chronic hypoxic and hypercapnic respiratory failure secondary to CHF who presents to the emergency room for evaluation of abdominal pain mostly in the left lower quadrant associated with diarrhea. 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 Pt went to procedure on 5/30 with IR for RFA and vertebroplasty at T12.   OT comments  Upon entering the room, pt supine in bed and having just finished getting cleaned up with assistance from nurse tech. Pt's family present in the room during session. Family having received news of multiple locations of MET's within their mother's body and trying to make decisions today. Family requesting therapy continues to offer services to patient while in hospital so she does not loose the strength she does currently have. [place] asked if she would like to attempt EOB which she declines but politely thanks me for asking. OT repositioning pt in bed for comfort with +2 assistance from family. Pt remains in bed with all needs within reach and call bell.    Recommendations for follow up therapy are one component of a multi-disciplinary discharge planning process, led by the attending physician.  Recommendations may be updated based on patient status, additional functional criteria and insurance authorization.    Follow Up Recommendations  Follow physician's recommendations for discharge plan and follow up therapies    Assistance Recommended at Discharge Frequent or constant Supervision/Assistance  Patient can return home with the following  A little help  with walking and/or transfers;A lot of help with bathing/dressing/bathroom;Assistance with cooking/housework;Assist for transportation;Help with stairs or ramp for entrance   Equipment Recommendations  Hospital bed       Precautions / Restrictions Precautions Precautions: Fall;Back Required Braces or Orthoses: Spinal Brace Spinal Brace: Thoracolumbosacral orthotic              ADL either performed or assessed with clinical judgement    Extremity/Trunk Assessment Upper Extremity Assessment Upper Extremity Assessment: Generalized weakness   Lower Extremity Assessment Lower Extremity Assessment: Generalized weakness        Vision Patient Visual Report: No change from baseline            Cognition Arousal/Alertness: Awake/alert   Overall Cognitive Status: Impaired/Different from baseline                                 General Comments: Pt does not speak much but is pleasant and answers questions in regards to her care during session.                   Pertinent Vitals/ Pain       Pain Assessment Pain Assessment: No/denies pain         Frequency  Min 2X/week        Progress Toward Goals  OT Goals(current goals can now be found in the care plan section)  Progress towards OT goals: Progressing toward goals  Acute Rehab OT Goals Patient Stated Goal: to decrease pain and go home OT Goal Formulation: With patient/family Time For Goal Achievement: 11/27/21 Potential to Achieve Goals: [place]  Frequency remains appropriate;Discharge plan needs to be updated       AM-PAC OT "6 Clicks" Daily Activity     Outcome Measure   Help from another person eating meals?: None Help from another person taking care of personal grooming?: A Lot Help from another person toileting, which includes using toliet, bedpan, or urinal?: Total Help from another person bathing (including washing, rinsing, drying)?: A Lot Help from another person to put on  and taking off regular upper body clothing?: A Little Help from another person to put on and taking off regular lower body clothing?: Total 6 Click Score: 13    End of Session Equipment Utilized During Treatment: Rolling walker (2 wheels);Oxygen  OT Visit Diagnosis: Other abnormalities of gait and mobility (R26.89)   Activity Tolerance Patient limited by fatigue   Patient Left in bed;with call bell/phone within reach;with nursing/sitter in room   Nurse Communication Mobility status        Time: 1610-9604 OT Time Calculation (min): 12 min  Charges: OT General Charges $OT Visit: 1 Visit OT Treatments $Therapeutic Activity: 8-22 mins  [name], MS, OTR/L , CBIS ascom [phone]  11/18/21, 4:05 PM

## 2021-11-18 NOTE — Progress Notes (Signed)
PT Cancellation Note  Patient Details Name: Daisy [name] MRN: [id number] DOB: Jan 01, 1951   Cancelled Treatment:     Therapist in this am, family at bedside. Family/pt currently awaiting updated information with decisions on POC to follow. Later documentation stated MRI revealed diffuse brain mets and family has chosen to d/c home on hospice. Will hold skilled at this time and discuss with primary PT.   Josie Dixon 11/18/2021, 11:55 AM

## 2021-11-18 NOTE — Progress Notes (Signed)
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    INTERVAL HISTORY: Patient more confused over the weekend prompting MRI of the brain which revealed innumerable brain metastasis.  Patient feels improved today.  She does not complain of pain today.  She continues to have a decreased performance status.  Family and patient are now agreeable to hospice.     REVIEW OF SYSTEMS:   Review of Systems  Constitutional:  Positive for malaise/fatigue. Negative for fever and weight loss.  Respiratory:  Positive for shortness of breath. Negative for cough and hemoptysis.   Cardiovascular: Negative.  Negative for chest pain and leg swelling.  Gastrointestinal: Negative.  Negative for abdominal pain.  Genitourinary: Negative.  Negative for dysuria.  Musculoskeletal: Negative.  Negative for back pain.  Skin: Negative.  Negative for rash.  Neurological:  Positive for weakness. Negative for focal weakness and headaches.  Psychiatric/Behavioral: Negative.  The patient is not nervous/anxious.    As per HPI. Otherwise, a complete review of systems is negative.  PAST MEDICAL HISTORY: Past Medical History:  Diagnosis Date   Acid reflux    Acute heart failure ([place]) 10/17/2019   AKI (acute kidney injury) ([place]) 10/17/2019   Arthritis    Breast cancer of upper-outer quadrant of right female breast ([place]) 11/19/2016   Chicken pox    Cholecystitis     Chronic diastolic heart failure ([place]) 01/04/2020   CKD (chronic kidney disease), stage III ([place]) 01/04/2020   Depression    Dyspnea    History of radiation therapy 01/20/17-03/02/17   left breast was treated to 50.4 Gy in 18 fractions   HLD (hyperlipidemia) 01/04/2020   Hyperlipemia    Irregular heart rate    Malignant neoplasm of upper-outer quadrant of left breast in female, estrogen receptor positive ([place]) 09/30/2016   Morbid obesity with BMI of 50.0-59.9, adult ([place]) 01/04/2020   Obesity    Peripheral vascular disease ([place])    Pneumonia    Prediabetes 01/04/2020   Urinary tract infection    Urinary tract infection with hematuria     PAST SURGICAL HISTORY: Past Surgical History:  Procedure Laterality Date   ABDOMINAL HYSTERECTOMY     BREAST LUMPECTOMY Left 11/19/2016   BREAST LUMPECTOMY WITH RADIOACTIVE SEED AND SENTINEL LYMPH NODE BIOPSY (Left)   BREAST LUMPECTOMY WITH RADIOACTIVE SEED AND SENTINEL LYMPH NODE BIOPSY Left 11/19/2016   Procedure: BREAST LUMPECTOMY WITH RADIOACTIVE SEED AND SENTINEL LYMPH NODE BIOPSY;  Surgeon: Rolm Bookbinder, MD;  Location: [facility];  Service: General;  Laterality: Left;   CESAREAN SECTION     CESAREAN SECTION     x 2   CHOLECYSTECTOMY  2007   IR BONE TUMOR(S)RF ABLATION  11/12/2021   IR KYPHO THORACIC WITH BONE BIOPSY  11/12/2021   IR US GUIDE BX ASP/DRAIN  11/12/2021   KNEE ARTHROSCOPY     bilateral   KNEE SURGERY Bilateral    LOWER EXTREMITY ANGIOGRAM N/A 04/04/2014   Procedure: LOWER EXTREMITY ANGIOGRAM;  Surgeon: Laverda Page, MD;  Location: [facility] CATH  LAB;  Service: Cardiovascular;  Laterality: N/A;   OVARIAN CYST REMOVAL     RE-EXCISION OF BREAST LUMPECTOMY Left 12/12/2016   Procedure: RE-EXCISION OF LEFT BREAST LUMPECTOMY;  Surgeon: Rolm Bookbinder, MD;  Location: [facility];  Service: General;  Laterality: Left;   ROTATOR CUFF REPAIR Right    TONSILLECTOMY     WRIST SURGERY Bilateral     FAMILY HISTORY: Family History  Problem Relation  Age of Onset   Heart disease Mother    Lung cancer Father    Heart disease Father    Heart disease Brother     ADVANCED DIRECTIVES (Y/N):  @ADVDIR @  HEALTH MAINTENANCE: Social History   Tobacco Use   Smoking status: Some Days    Packs/day: 1.00    Years: 30.00    Pack years: 30.00    Types: E-cigarettes, Cigarettes    Last attempt to quit: 11/02/2019    Years since quitting: 2.0   Smokeless tobacco: Never  Vaping Use   Vaping Use: Never used  Substance Use Topics   Alcohol use: No   Drug use: No     Colonoscopy:  PAP:  Bone density:  Lipid panel:  Allergies  Allergen Reactions   Elemental Sulfur Hives   Levaquin [Levofloxacin] Other (See Comments)    Body aches   Statins     Current Facility-Administered Medications  Medication Dose Route Frequency Provider Last Rate Last Admin   0.9 %  sodium chloride infusion  250 mL Intravenous PRN Agbata, Tochukwu, MD       acetaminophen (TYLENOL) tablet 650 mg  650 mg Oral Q4H PRN Agbata, Tochukwu, MD   650 mg at 11/18/21 0846   aspirin chewable tablet 81 mg  81 mg Oral Daily Nicole Kindred A, DO   81 mg at 11/18/21 0846   cholecalciferol (VITAMIN D3) tablet 1,000 Units  1,000 Units Oral Daily Agbata, Tochukwu, MD   1,000 Units at 11/18/21 0847   cilostazol (PLETAL) tablet 50 mg  50 mg Oral BID Nicole Kindred A, DO   50 mg at 11/18/21 0848   cyclobenzaprine (FLEXERIL) tablet 5 mg  5 mg Oral TID PRN Agbata, Tochukwu, MD   5 mg at 11/14/21 2027   [START ON 11/19/2021] dexamethasone (DECADRON) tablet 2 mg  2 mg Oral Daily Earlie Counts, NP       enoxaparin (LOVENOX) injection 70 mg  70 mg Subcutaneous Q24H Rito Ehrlich A, RPH   70 mg at 11/18/21 0850   FLUoxetine (PROZAC) capsule 20 mg  20 mg Oral Daily Agbata, Tochukwu, MD   20 mg at 11/18/21 0849   HYDROcodone-acetaminophen (NORCO/VICODIN) 5-325 MG per tablet 1 tablet  1 tablet Oral Q4H PRN Agbata, Tochukwu, MD   1 tablet at 11/16/21 1542   ipratropium-albuterol (DUONEB)  0.5-2.5 (3) MG/3ML nebulizer solution 3 mL  3 mL Nebulization Q6H PRN Agbata, Tochukwu, MD       loperamide (IMODIUM) capsule 2 mg  2 mg Oral PRN Nicole Kindred A, DO       LORazepam (ATIVAN) tablet 1 mg  1 mg Oral Q4H PRN Earlie Counts, NP       metoprolol succinate (TOPROL-XL) 24 hr tablet 12.5 mg  12.5 mg Oral Daily Agbata, Tochukwu, MD   12.5 mg at 11/18/21 0849   morphine 10 MG/5ML solution 2.5 mg  2.5 mg Oral Q2H PRN Earlie Counts, NP       ondansetron (ZOFRAN) tablet 4 mg  4 mg Oral Q6H PRN Agbata,  Tochukwu, MD       Or   ondansetron (ZOFRAN) injection 4 mg  4 mg Intravenous Q6H PRN Agbata, Tochukwu, MD       pantoprazole (PROTONIX) EC tablet 20 mg  20 mg Oral Daily Agbata, Tochukwu, MD   20 mg at 11/18/21 0847   potassium chloride SA (KLOR-CON M) CR tablet 20 mEq  20 mEq Oral BID Loletha Grayer, MD   20 mEq at 11/18/21 0850   senna (SENOKOT) tablet 8.6 mg  1 tablet Oral QHS Mariana Kaufman J, NP       sodium chloride flush (NS) 0.9 % injection 3 mL  3 mL Intravenous Q12H Agbata, Tochukwu, MD   3 mL at 11/18/21 0851   sodium chloride flush (NS) 0.9 % injection 3 mL  3 mL Intravenous PRN Agbata, Tochukwu, MD       torsemide (DEMADEX) tablet 20 mg  20 mg Oral Daily Loletha Grayer, MD   20 mg at 11/18/21 0846   vitamin B-12 (CYANOCOBALAMIN) tablet 1,000 mcg  1,000 mcg Oral Daily Agbata, Tochukwu, MD   1,000 mcg at 11/18/21 0849    OBJECTIVE: Vitals:   11/18/21 0459 11/18/21 1159  BP: 101/64 135/76  Pulse: (!) 110 (!) 108  Resp:    Temp: 97.8 F (36.6 C) 98.2 F (36.8 C)  SpO2: 91% 93%     Body mass index is 48.58 kg/m.    ECOG FS:4 - Bedbound  General: Well-developed, well-nourished, no acute distress. Eyes: Pink conjunctiva, anicteric sclera. HEENT: Normocephalic, moist mucous membranes. Lungs: No audible wheezing or coughing. Heart: Regular rate and rhythm. Abdomen: Soft, nontender, no obvious distention. Musculoskeletal: No edema, cyanosis, or clubbing. Neuro: Alert,  answering all questions appropriately. Cranial nerves grossly intact. Skin: No rashes or petechiae noted. Psych: Normal affect.  LAB RESULTS:  Lab Results  Component Value Date   NA 141 11/18/2021   K 3.8 11/18/2021   CL 98 11/18/2021   CO2 33 (H) 11/18/2021   GLUCOSE 111 (H) 11/18/2021   BUN 25 (H) 11/18/2021   CREATININE 1.34 (H) 11/18/2021   CALCIUM 9.4 11/18/2021   PROT 6.8 11/05/2021   ALBUMIN 3.4 (L) 11/05/2021   AST 62 (H) 11/05/2021   ALT 59 (H) 11/05/2021   ALKPHOS 70 11/05/2021   BILITOT 0.8 11/05/2021   GFRNONAA 42 (L) 11/18/2021   GFRAA 42 (L) 02/14/2020    Lab Results  Component Value Date   WBC 13.0 (H) 11/18/2021   NEUTROABS 5.2 06/04/2021   HGB 13.6 11/18/2021   HCT 44.2 11/18/2021   MCV 97.6 11/18/2021   PLT 141 (L) 11/18/2021     STUDIES: DG Chest 2 View  Result Date: 11/05/2021 CLINICAL DATA:  Productive cough, wheezing EXAM: CHEST - 2 VIEW COMPARISON:  10/25/2021 FINDINGS: Left basilar opacity persists, likely representing a lungs are otherwise clear. No pneumothorax. Cardiac size is mildly enlarged. Pulmonary vascularity is normal. No acute bone abnormality. Moderate left pleural effusion with associated left basilar atelectasis or infiltrate. IMPRESSION: Stable moderate left pleural effusion with associated left basilar atelectasis or infiltrate. Given its persistence, CT imaging with contrast would be helpful to exclude the presence of a chronic infiltrate within the left lung base. Electronically Signed   By: Fidela Salisbury M.D.   On: 11/05/2021 19:51   DG Chest 2 View  Result Date: 10/25/2021 CLINICAL DATA:  Shortness of breath, hypoxia, chest congestion EXAM: CHEST - 2 VIEW COMPARISON:  10/24/2019 FINDINGS: Enlargement of cardiac silhouette with pulmonary vascular congestion. Mediastinal  contours normal with atherosclerotic calcifications aorta. LEFT pleural effusion and basilar atelectasis versus consolidation. Remaining lungs clear. No  pneumothorax or acute osseous findings. IMPRESSION: Enlargement of cardiac silhouette with pulmonary vascular congestion. LEFT basilar opacity in consistent with LEFT pleural effusion and associated atelectasis versus consolidation LEFT lower lobe. Aortic Atherosclerosis (ICD10-I70.0). Electronically Signed   By: Lavonia Dana M.D.   On: 10/25/2021 15:00   DG Lumbar Spine 2-3 Views  Result Date: 10/26/2021 CLINICAL DATA:  Low back pain. EXAM: LUMBAR SPINE - 2-3 VIEW COMPARISON:  12/26/2008. FINDINGS: There is no evidence of acute lumbar spine fracture. Alignment is normal. Multilevel intervertebral disc space narrowing, degenerative endplate osteophyte formation, and facet arthropathy is noted. There is atherosclerotic calcification of the abdominal aorta. IMPRESSION: Moderate degenerative changes in the lumbar spine. No evidence of acute fracture. Electronically Signed   By: Brett Fairy M.D.   On: 10/26/2021 21:42   MR BRAIN W WO CONTRAST  Result Date: 11/18/2021 CLINICAL DATA:  Initial evaluation for small cell lung cancer, staging. EXAM: MRI HEAD WITHOUT AND WITH CONTRAST TECHNIQUE: Multiplanar, multiecho pulse sequences of the brain and surrounding structures were obtained without and with intravenous contrast. CONTRAST:  39mL GADAVIST GADOBUTROL 1 MMOL/ML IV SOLN COMPARISON:  Prior CT from 10/18/2019. FINDINGS: Brain: Examination moderately degraded by motion artifact. Cerebral volume within normal limits. Patchy and confluent T2/FLAIR hyperintensity involving the periventricular and deep white matter both cerebral hemispheres, most consistent with chronic small vessel ischemic disease, moderately advanced in nature. Small remote lacunar infarcts at the left caudate. There are innumerable scattered metastatic lesions seen throughout both cerebral hemispheres. For reference purposes, the largest lesion is seen at the anterior left temporal lobe in measures 1.7 x 2.2 cm (series 7, image 11). This lesion  demonstrates prominent susceptibility artifact, and could reflect parenchymal hemorrhage related to an underlying lesion. Mild localized edema without significant regional mass effect. Mild susceptibility artifacts seen about a few additional lesions elsewhere within the brain, suggesting blood products. The largest lesion within the right cerebral hemisphere is seen at the right frontal operculum and measures 9 mm (series 13, image 16). Asymmetric dural thickening and enhancement noted overlying the right cerebral convexity. There is at least 1 infratentorial lesion at the central cerebellum measuring 7 mm (series 12, image 6). No other significant regional mass effect or midline shift. No acute vascular territory infarct. No midline shift or hydrocephalus. No extra-axial fluid collection. Pituitary gland suprasellar region normal. Vascular: Major intracranial vascular flow voids are maintained. Skull and upper cervical spine: Craniocervical junction within normal limits. Innumerable scattered osseous metastatic lesions also noted throughout the calvarium and visualized skull base, most evident on diffusion-weighted sequences. No significant or visible extra osseous component seen on this motion degraded exam. Sinuses/Orbits: Globes and orbital soft tissues within normal limits. Paranasal sinuses are largely clear. No mastoid effusion. Other: None. IMPRESSION: 1. Innumerable intracranial metastatic lesions as above. Note made of a 2.2 cm lesion/area at the anterior left temporal lobe with associated susceptibility artifact and localized edema, suggesting associated hemorrhage and/or hemorrhagic metastasis. Correlation with dedicated noncontrast head CT could be performed for further evaluation as warranted. 2. Widespread osseous metastatic disease throughout the calvarium and visualized skull base. 3. Underlying moderately advanced chronic microvascular ischemic disease. Electronically Signed   By: Jeannine Boga M.D.   On: 11/18/2021 00:44   MR THORACIC SPINE W WO CONTRAST  Result Date: 11/06/2021 CLINICAL DATA:  Bone lesion, thoracic spine.  Compression fracture. EXAM: MRI THORACIC WITHOUT AND  WITH CONTRAST TECHNIQUE: Multiplanar and multiecho pulse sequences of the thoracic spine were obtained without and with intravenous contrast. CONTRAST:  37mL GADAVIST GADOBUTROL 1 MMOL/ML IV SOLN COMPARISON:  CT chest, abdomen, and pelvis 11/06/2021 FINDINGS: Alignment:  Trace anterolisthesis of T4 on T5. Vertebrae: Widespread T1 hypointense, enhancing lesions at every level in the thoracic spine involving the vertebral bodies and posterior elements. Pathologic T12 compression fracture with 30% vertebral body height loss. Mild bulging of the posterior cortex of the T12 vertebral body with suspected small volume ventral epidural tumor not resulting in significant spinal stenosis or spinal cord mass effect. Widespread bone lesions throughout the visualized portions of the cervical spine, lumbar spine, and pelvis. Cord:  Normal cord signal.  No abnormal intradural enhancement. Paraspinal and other soft tissues: Obstructing left lower lobe pulmonary mass, mediastinal lymphadenopathy, and bilateral adrenal masses as shown on today's earlier CT. Asymmetric left renal atrophy. Disc levels: Diffuse thoracic disc and facet degeneration. Central/paracentral disc protrusions from T5-6 to T10-11, largest at T5-6 and T8-9 where there is mild ventral cord flattening without significant generalized spinal stenosis. IMPRESSION: 1. Widespread osseous metastases throughout the spine and visualized pelvis. 2. Pathologic T12 compression fracture with 30% height loss and suspected small volume ventral epidural tumor. No significant spinal stenosis. 3. Diffuse thoracic disc and facet degeneration as above. Electronically Signed   By: Logan Bores M.D.   On: 11/06/2021 15:06   CT CHEST ABDOMEN PELVIS W CONTRAST  Result Date:  11/06/2021 CLINICAL DATA:  Sepsis, diarrhea EXAM: CT CHEST, ABDOMEN, AND PELVIS WITH CONTRAST TECHNIQUE: Multidetector CT imaging of the chest, abdomen and pelvis was performed following the standard protocol during bolus administration of intravenous contrast. RADIATION DOSE REDUCTION: This exam was performed according to the departmental dose-optimization program which includes automated exposure control, adjustment of the mA and/or kV according to patient size and/or use of iterative reconstruction technique. CONTRAST:  139mL OMNIPAQUE IOHEXOL 300 MG/ML  SOLN COMPARISON:  None Available. FINDINGS: CT CHEST FINDINGS Cardiovascular: Moderate coronary artery calcification. Left anterior descending coronary artery stenting has been performed. Global cardiac size within normal limits. No pericardial effusion. Central pulmonary arteries are enlarged in keeping with changes of pulmonary arterial hypertension. Moderate atherosclerotic calcification within the thoracic aorta. No aortic aneurysm. Mediastinum/Nodes: There is pathologic mediastinal adenopathy within the prevascular, right paratracheal, subcarinal, and aortopulmonary lymph node groups. Index lymph node measures 1.8 x 2.7 cm at axial image # 21/2 within the right paratracheal lymph node group. Visualized thyroid is unremarkable. Esophagus unremarkable. Lungs/Pleura: There is a a central pulmonary mass within the left lower lobe which is poorly delineated due to the adjacent collapsed lung, but results in obliteration of the left lower lobar pulmonary bronchus and narrowing of the left inferior pulmonary vein. This measures roughly 2.7 x 3.8 cm at axial image # 37/2. There is a suspected pleural metastasis within the left cardiophrenic angle seen at axial image # 43/2 and inferiorly at axial image # 49/2. Small left pleural effusion is present. Nodular pleural thickening anteriorly within the left costophrenic sulcus anteriorly. Left upper lobe and right lung  are clear. No pneumothorax. No pleural effusion on the right. Musculoskeletal: No acute bone abnormality. No lytic or blastic bone lesion. Surgical clips are seen within the left breast. CT ABDOMEN PELVIS FINDINGS Hepatobiliary: Innumerable hypoenhancing masses are seen throughout the liver in keeping with widespread hepatic metastases. Mild resultant hepatomegaly. Cholecystectomy has been performed. No intra or extrahepatic biliary ductal dilation. Pancreas: Unremarkable Spleen: Unremarkable Adrenals/Urinary Tract: Bilateral  adrenal masses are identified in keeping with bilateral adrenal metastases. The kidneys are normal in position. Moderate right and moderate to severe left renal cortical atrophy. No hydronephrosis. No intrarenal or ureteral calculi. No enhancing intrarenal masses. The bladder is unremarkable. Stomach/Bowel: Moderate sigmoid diverticulosis. The stomach, large bowel, and small bowel are otherwise unremarkable. Appendix normal. No free intraperitoneal gas or fluid. Vascular/Lymphatic: There is extensive aortoiliac atherosclerotic calcification. No aortic aneurysm. Pathologic periportal lymph node is seen measuring 18 mm in short axis diameter at axial image # 60/2. Reproductive: Status post hysterectomy. No adnexal masses. Other: No abdominal wall hernia. Musculoskeletal: No lytic or blastic bone lesion. There is a a acute to subacute superior endplate fracture of Z66 with approximately 10-20% loss of height. No retropulsion. IMPRESSION: Central obstructing mass within the left lower lobe, poorly delineated, most in keeping with a primary pulmonary malignancy with resultant obliteration of the left lower lobar pulmonary bronchus and narrowing of the left inferior pulmonary vein. Resultant collapse of the left lower lobe. Pathologic mediastinal and periportal adenopathy, left pleural metastases and probable associated malignant pleural effusion, widespread hepatic metastases, and bilateral adrenal  metastases. PET CT examination would be helpful to better delineate the primary mass and confirm the presence of pleural metastatic disease. Hepatic metastases should be easily amenable to ultrasound-guided biopsy for further evaluation. Moderate coronary artery calcification. Morphologic changes in keeping with pulmonary arterial hypertension. Moderate sigmoid diverticulosis. Acute to subacute superior endplate fracture of A63 with 10-20% loss of height. No retropulsion. This could be confirmed with MRI examination. Aortic Atherosclerosis (ICD10-I70.0). Electronically Signed   By: Fidela Salisbury M.D.   On: 11/06/2021 01:08   IR Bone Tumor(s)RF Ablation  Result Date: 11/12/2021 CLINICAL DATA:  71 year old female with pathologic symptomatic T12 compression fracture. She presents for transpedicular bone biopsy, radiofrequency ablation and cement augmentation with balloon kyphoplasty. EXAM: FLUOROSCOPIC GUIDED T12 VERTEBRAL BODY BIOPSY RF ABLATION AND KYPHOPLASTY/CEMENT AUGMENTATION. COMPARISON:  None Available. MEDICATIONS: Ancef 2 g IV; The antibiotic was administered in an appropriate time interval prior to needle puncture of the skin. ANESTHESIA/SEDATION: Versed 3 mg IV; Fentanyl 150 mcg IV Moderate Sedation Time: 75 minutes; The patient was continuously monitored during the procedure by the interventional radiology nurse under my direct supervision. FLUOROSCOPY TIME:  Radiation exposure index: 167.5 mGy reference air kerma COMPLICATIONS: None immediate. TECHNIQUE: Informed written consent was obtained from the patient after a thorough discussion of the procedural risks, benefits and alternatives. All questions were addressed. Maximal Sterile Barrier Technique was utilized including caps, mask, sterile gowns, sterile gloves, sterile drape, hand hygiene and skin antiseptic. A timeout was performed prior to the initiation of the procedure. The patient was placed prone on the fluoroscopic table. The skin overlying  the thoracic region was then prepped and draped in the usual sterile fashion. Maximal barrier sterile technique was utilized including caps, mask, sterile gowns, sterile gloves, sterile drape, hand hygiene and skin antiseptic. Intravenous Fentanyl and Versed were administered as conscious sedation during continuous cardiorespiratory monitoring by the radiology RN. The left pedicle at T12 was then infiltrated with 1% lidocaine followed by the advancement of a Kyphon trocar needle through the left pedicle into the posterior one-third of the vertebral body. A biopsy specimen was then obtained. Subsequently, the osteo drill was advanced to the anterior third of the vertebral body. The osteo drill was retracted. Through the working cannula, a 15 mm OsteoCool RF ablation probe was inserted and positioned under fluoroscopic guidance. In similar fashion, the right T12 pedicle was  infiltrated with 1% lidocaine. Utilizing a extra pedicular approach, a second Kyphon trocar needle was advanced into the posterior third of the vertebral body. A biopsy specimen was then obtained. Subsequently, the osteo drill was coaxially advanced to the anterior right third. The osteo drill was exchanged for a 15 mm OsteoCool RF ablation probe which was positioned under fluoroscopic guidance. With both OsteoCool ablation probes in place, the ablation was performed for 11 minutes. Attention was now paid towards the kyphoplasty portion of the procedure. Beginning at the T12 vertebral body level, a Kyphon inflatable bone tamp 15 x 2.5 was advanced through both working cannulas and positioned with the distal marker approximately 5 mm from the anterior aspect of the cortex. Appropriate positioning was confirmed on the AP projection. At this time, the balloon was expanded using contrast via a Kyphon inflation syringe device via micro tubing. Inflations were continued under direct fluoroscopic guidance. At this time, methylmethacrylate mixture was  reconstituted in the Kyphon bone mixing device system. This was then loaded into the delivery mechanism, attached to Kyphon bone fillers. The balloons were deflated and removed followed by the instillation of methylmethacrylate mixture with excellent filling in the AP and lateral projections. The working cannulae and the bone filler were then retrieved and removed. Multiple spot radiographic images were obtained in various obliquities. Hemostasis was achieved with manual compression. The patient tolerated the procedure well without immediate postprocedural complication. FINDINGS: Completion images demonstrate a technically excellent result with adequate cement filling of the T12 vertebral body on both the AP and lateral projections. No extravasation was noted in the disk spaces or posteriorly into the spinal canal. No epidural venous contamination was seen. IMPRESSION: Technically successful T12 vertebral body biopsy, ablation and cement augmentation using balloon kyphoplasty. PLAN: The patient will be seen for clinical follow-up at the interventional radiology clinic in 2-4 weeks. Signed, Criselda Peaches, MD [facility] Radiology Electronically Signed   By: Jacqulynn Cadet M.D.   On: 11/12/2021 15:57   IR US Guide Bx Asp/Drain  Result Date: 11/12/2021 INDICATION: 71 year old female with lung mass and advanced metastatic disease including multiple liver lesions. Findings are concerning for possible primary bronchogenic carcinoma with metastatic disease. Patient presents for ultrasound-guided core biopsy of liver mass to establish tissue diagnosis as well as osteo cool procedure of pathologic fracture at T12. EXAM: Ultrasound-guided core biopsy of liver mass MEDICATIONS: None. ANESTHESIA/SEDATION: Total sedation time reported in kyphoplasty procedure. FLUOROSCOPY TIME:  None. COMPLICATIONS: None immediate. PROCEDURE: Informed written consent was obtained from  the patient after a thorough discussion of the procedural risks, benefits and alternatives. All questions were addressed. Maximal Sterile Barrier Technique was utilized including caps, mask, sterile gowns, sterile gloves, sterile drape, hand hygiene and skin antiseptic. A timeout was performed prior to the initiation of the procedure. Ultrasound was used to interrogate the liver. Multiple hypoechoic masses are identified. A suitable mass was selected for biopsy. The skin was sterilely prepped and draped in the standard fashion using chlorhexidine skin prep. Local anesthesia was attained by infiltration with 1% lidocaine. A small dermatotomy was made. An 18 gauge trocar needle was then advanced through the skin, into the liver and positioned at the margin of the liver mass. Multiple 18 gauge core biopsies were then obtained coaxially using the bio Pince automated biopsy device. Biopsy specimens were placed in formalin and delivered to pathology for further analysis. As the introducer needle was removed, the biopsy tract was embolized with a Gel-Foam slurry. Post  biopsy imaging demonstrates no evidence of immediate complication. The patient tolerated the procedure well. IMPRESSION: Successful ultrasound-guided core biopsy of liver mass. Electronically Signed   By: Jacqulynn Cadet M.D.   On: 11/12/2021 15:18   IR KYPHO THORACIC WITH BONE BIOPSY  Result Date: 11/12/2021 CLINICAL DATA:  71 year old female with pathologic symptomatic T12 compression fracture. She presents for transpedicular bone biopsy, radiofrequency ablation and cement augmentation with balloon kyphoplasty. EXAM: FLUOROSCOPIC GUIDED T12 VERTEBRAL BODY BIOPSY RF ABLATION AND KYPHOPLASTY/CEMENT AUGMENTATION. COMPARISON:  None Available. MEDICATIONS: Ancef 2 g IV; The antibiotic was administered in an appropriate time interval prior to needle puncture of the skin. ANESTHESIA/SEDATION: Versed 3 mg IV; Fentanyl 150 mcg IV Moderate Sedation Time: 75  minutes; The patient was continuously monitored during the procedure by the interventional radiology nurse under my direct supervision. FLUOROSCOPY TIME:  Radiation exposure index: 167.5 mGy reference air kerma COMPLICATIONS: None immediate. TECHNIQUE: Informed written consent was obtained from the patient after a thorough discussion of the procedural risks, benefits and alternatives. All questions were addressed. Maximal Sterile Barrier Technique was utilized including caps, mask, sterile gowns, sterile gloves, sterile drape, hand hygiene and skin antiseptic. A timeout was performed prior to the initiation of the procedure. The patient was placed prone on the fluoroscopic table. The skin overlying the thoracic region was then prepped and draped in the usual sterile fashion. Maximal barrier sterile technique was utilized including caps, mask, sterile gowns, sterile gloves, sterile drape, hand hygiene and skin antiseptic. Intravenous Fentanyl and Versed were administered as conscious sedation during continuous cardiorespiratory monitoring by the radiology RN. The left pedicle at T12 was then infiltrated with 1% lidocaine followed by the advancement of a Kyphon trocar needle through the left pedicle into the posterior one-third of the vertebral body. A biopsy specimen was then obtained. Subsequently, the osteo drill was advanced to the anterior third of the vertebral body. The osteo drill was retracted. Through the working cannula, a 15 mm OsteoCool RF ablation probe was inserted and positioned under fluoroscopic guidance. In similar fashion, the right T12 pedicle was infiltrated with 1% lidocaine. Utilizing a extra pedicular approach, a second Kyphon trocar needle was advanced into the posterior third of the vertebral body. A biopsy specimen was then obtained. Subsequently, the osteo drill was coaxially advanced to the anterior right third. The osteo drill was exchanged for a 15 mm OsteoCool RF ablation probe which  was positioned under fluoroscopic guidance. With both OsteoCool ablation probes in place, the ablation was performed for 11 minutes. Attention was now paid towards the kyphoplasty portion of the procedure. Beginning at the T12 vertebral body level, a Kyphon inflatable bone tamp 15 x 2.5 was advanced through both working cannulas and positioned with the distal marker approximately 5 mm from the anterior aspect of the cortex. Appropriate positioning was confirmed on the AP projection. At this time, the balloon was expanded using contrast via a Kyphon inflation syringe device via micro tubing. Inflations were continued under direct fluoroscopic guidance. At this time, methylmethacrylate mixture was reconstituted in the Kyphon bone mixing device system. This was then loaded into the delivery mechanism, attached to Kyphon bone fillers. The balloons were deflated and removed followed by the instillation of methylmethacrylate mixture with excellent filling in the AP and lateral projections. The working cannulae and the bone filler were then retrieved and removed. Multiple spot radiographic images were obtained in various obliquities. Hemostasis was achieved with manual compression. The patient tolerated the procedure well without immediate postprocedural complication. FINDINGS:  Completion images demonstrate a technically excellent result with adequate cement filling of the T12 vertebral body on both the AP and lateral projections. No extravasation was noted in the disk spaces or posteriorly into the spinal canal. No epidural venous contamination was seen. IMPRESSION: Technically successful T12 vertebral body biopsy, ablation and cement augmentation using balloon kyphoplasty. PLAN: The patient will be seen for clinical follow-up at the interventional radiology clinic in 2-4 weeks. Signed, Criselda Peaches, MD [facility] Radiology Electronically Signed   By: Jacqulynn Cadet M.D.   On: 11/12/2021 15:57   ECHOCARDIOGRAM COMPLETE  Result Date: 11/09/2021    ECHOCARDIOGRAM REPORT   Patient Name:   KEONI HAVEY Date of Exam: 10/27/2021 Medical Rec #:  [id number]            Height:       67.0 in Accession #:    [id number]           Weight:       340.0 lb Date of Birth:  [birth year]            BSA:          2.534 m Patient Age:    71 years             BP:           110/48 mmHg Patient Gender: F                    HR:           103 bpm. Exam Location:  Inpatient Procedure: 2D Echo, Cardiac Doppler, Color Doppler and Intracardiac            Opacification Agent Indications:     CHF-acute diastolic  History:         Patient has prior history of Echocardiogram examinations, most                  recent 10/18/2019. CHF; Risk Factors:Hypertension, Current Smoker                  and Dyslipidemia. GERD. CKD.  Sonographer:     Clayton Lefort RDCS (AE) Referring Phys:  [id number] Orma Flaming Diagnosing Phys: Rex Kras DO  Sonographer Comments: Technically challenging study due to limited acoustic windows, Technically difficult study due to poor echo windows, suboptimal parasternal window, suboptimal apical window, suboptimal subcostal window and patient is morbidly obese.  Image acquisition challenging due to patient body habitus. IMPRESSIONS  1. Technically difficult study.  2. Left ventricular ejection fraction, by estimation, is 55 to 60%. The left ventricle has normal function. The left ventricle has no regional wall motion abnormalities. Left ventricular diastolic parameters were normal.  3. Right ventricular systolic function is mildly reduced. The right ventricular size is normal.  4. Right atrial size was grossly normal.  5. The mitral valve is grossly normal. No evidence of mitral valve regurgitation. No evidence of mitral stenosis.  6. The aortic valve was not well visualized. Aortic valve regurgitation is not visualized. No aortic stenosis is present.  7. There is mild  dilatation of the ascending aorta, measuring 38 mm.  8. The inferior vena cava is dilated in size with >50% respiratory variability, suggesting right atrial pressure of 8 mmHg. Comparison(s): A prior study was performed on 10/18/2019. LVEF 85-46%, diastolic parameters indeterminate, RV systolic function normal and size not well visualized, trivial MR, dialted IVC, estimated RAP 79mmHG. FINDINGS  Left Ventricle:  Left ventricular ejection fraction, by estimation, is 55 to 60%. The left ventricle has normal function. The left ventricle has no regional wall motion abnormalities. Definity contrast agent was given IV to delineate the left ventricular  endocardial borders. The left ventricular internal cavity size was normal in size. There is no left ventricular hypertrophy. Left ventricular diastolic parameters were normal. Right Ventricle: The right ventricular size is normal. No increase in right ventricular wall thickness. Right ventricular systolic function is mildly reduced. Left Atrium: Left atrial size was normal in size. Right Atrium: Right atrial size was grossly normal. Pericardium: There is no evidence of pericardial effusion. Mitral Valve: The mitral valve is grossly normal. No evidence of mitral valve regurgitation. No evidence of mitral valve stenosis. Tricuspid Valve: The tricuspid valve is grossly normal. Tricuspid valve regurgitation is trivial. No evidence of tricuspid stenosis. Aortic Valve: The aortic valve was not well visualized. Aortic valve regurgitation is not visualized. No aortic stenosis is present. Aortic valve mean gradient measures 3.0 mmHg. Aortic valve peak gradient measures 6.2 mmHg. Aortic valve area, by VTI measures 2.15 cm. Pulmonic Valve: The pulmonic valve was not well visualized. Pulmonic valve regurgitation is not visualized. Aorta: The aortic root is normal in size and structure. There is mild dilatation of the ascending aorta, measuring 38 mm. Venous: The inferior vena cava is  dilated in size with greater than 50% respiratory variability, suggesting right atrial pressure of 8 mmHg. IAS/Shunts: The interatrial septum was not well visualized.  LEFT VENTRICLE PLAX 2D LVIDd:         3.60 cm   Diastology LVIDs:         2.70 cm   LV e' medial:    6.00 cm/s LV PW:         1.30 cm   LV E/e' medial:  9.0 LV IVS:        1.60 cm   LV e' lateral:   5.00 cm/s LVOT diam:     2.40 cm   LV E/e' lateral: 10.8 LV SV:         50 LV SV Index:   20 LVOT Area:     4.52 cm  IVC IVC diam: 2.70 cm LEFT ATRIUM           Index LA diam:      3.50 cm 1.38 cm/m LA Vol (A4C): 51.3 ml 20.25 ml/m  AORTIC VALVE AV Area (Vmax):    2.11 cm AV Area (Vmean):   2.33 cm AV Area (VTI):     2.15 cm AV Vmax:           125.00 cm/s AV Vmean:          83.800 cm/s AV VTI:            0.231 m AV Peak Grad:      6.2 mmHg AV Mean Grad:      3.0 mmHg LVOT Vmax:         58.20 cm/s LVOT Vmean:        43.100 cm/s LVOT VTI:          0.110 m LVOT/AV VTI ratio: 0.48  AORTA Ao Root diam: 3.50 cm Ao Asc diam:  3.80 cm MV E velocity: 54.00 cm/s MV A velocity: 80.00 cm/s  SHUNTS MV E/A ratio:  0.68        Systemic VTI:  0.11 m  Systemic Diam: 2.40 cm Sunit Tolia DO Electronically signed by Rex Kras DO Signature Date/Time: 11/09/2021/3:40:39 PM    Final     ASSESSMENT: 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  Patient and her family agree potential treatments with chemotherapy would be detrimental and wished to be discharged home with hospice.  No further interventions are needed.  No follow-up is necessary in the cancer center.   History of breast cancer: ER/PR positive, stage I.  Patient had low risk Oncotype therefore did not require chemotherapy.  She  completed 5 years of anastrozole approximately 1 month ago. CA 27-29 is within normal limits. Pain.  Improved.  Patient underwent recent kyphoplasty.  Continue narcotics as prescribed. Decreased performance status: Hospice as above.   Renal insufficiency: Patient is enrolling in hospice, no further labs are necessary.  Will follow.  Lloyd Huger, MD   11/18/2021 2:00 PM

## 2021-11-18 NOTE — Progress Notes (Signed)
Patient is a yellow MEWS with an increase heart rate. Patient is stable, and confused. Just came from MRI and new IV placed. Patient encourage to keep oxygen on due to decreasing O2 sats. Will continue to monitor.

## 2021-11-18 NOTE — Progress Notes (Signed)
[facility]) Hospital Liaison Note   Received request from Transitions of Care Manager, Hattie Perch., RN, for hospice services at home after discharge. Chart and patient information under review by [facility] physician. Hospice eligibility pending.   Spoke with daughter(s) [name] & [name] to initiate education related to hospice philosophy, services, and team approach to care. Both verbalized understanding of information given. Per discussion, the plan is for patient to discharge home via [facility] once cleared to DC.    DME needs discussed. Patient has the following equipment in the home: Engineer, water with [place] 2/3 L Trilogy  Patient requests the following equipment for delivery: Bariatric Hospital Bed  [employer] incontinence product (AV nurse please provide) Bariatric Wheelchair Address verified and is correct in the chart. [name] is the family member to contact to arrange time of equipment delivery.    Please send signed and completed DNR home with patient/family. Please provide prescriptions at discharge as needed to ensure ongoing symptom management.    [facility] information and contact numbers given to family & above information shared with TOC.   Please call with any questions/concerns.    Thank you for the opportunity to participate in this patient's care.   Daphene Calamity, MSW [facility] Liaison  [phone]

## 2021-11-18 NOTE — Progress Notes (Signed)
Progress Note   Patient: Daisy [name] XFG:[id number] DOB: 15-Dec-1950 DOA: 11/05/2021     12 DOS: the patient was seen and examined on 11/18/2021   Brief hospital course: 71 year old female with past medical history of breast cancer, GERD, arthritis, chronic diastolic congestive heart failure, chronic kidney disease, depression, hyperlipidemia, morbid obesity, peripheral vascular disease presented to the hospital with back pain.  On imaging studies she was diagnosed with a T12 compression fracture.  She was also found to have a mass of the lung with liver metastases, adrenal metastases and osseous metastases.  The patient was seen in consultation by oncology, interventional radiology, neurosurgery and pulmonary.  Kyphoplasty delayed until insurance authorization was received.  Liver biopsy and kyphoplasty done on 11/12/2021.  Palliative care consulted 5/31 at request of patient's daughter.  Initially PT/OT were recommending home health, but due to progressive weakness, now recommend SNF.  TOC following.   Hospital course complicated by UTI.  Started on Rocephin, cultures later grew Pseudomonas, changed antibiotic to Cefepime on 6/4.  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  Status post kyphoplasty by IR on 11/12/2021.   -- Brace when out of bed -- Pain control as needed  Hospice care patient Decision made this morning 6/5 to pursue hospice care at home. --CODE STATUS changed DNR -- DME ordered: Hospital bed, wheelchair -- Hospice liaison notified  Leukocytosis Likely due to UTI versus possibly reactive in setting of compression fracture and procedures including kyphoplasty liver biopsy.  White count has improved on antibiotics for UTI.  Metastatic primary lung cancer, left [facility]) Patient has a mass of the lung, liver, adrenals and osseous metastases.  Patient does  have a history of breast cancer. Follows with Dr. Grayland Ormond.   5/30 Liver and T12 bone biopsies obtained.  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 -- Oncology consulted -- Palliative care consulted -- MRI brain with without pending, ordering inpatient given patient's mental status changes -- PET scan as outpatient  UTI (urinary tract infection) Started Rocephin on evening of 11/14/2021 due to rising WBC and patient report of dysuria.  UA with moderate leukocytes, 21-50 WBCs and rare bacteria. Started on empiric Rocephin  6/4: urine culture growing Pseudomonas, changed antibiotic to cefepime  6/5: Urine culture susceptibilities returned, will complete treatment with single dose fosfomycin today.    Acute metabolic encephalopathy Suspect this is due to UTI.  Patient has waxing and waning episodes of confusion.  Daughters reports she is cognitively sharp at baseline, no memory issues. -- Treating UTI as outlined -- MRI brain ordered as outpatient for oncology staging, will get this done inpatient -- Delirium precautions  Acute kidney injury superimposed on CKD ([place]) Acute kidney injury on CKD stage IIIa.  Creatinine improved from 1.48 down to 1.10 even with diuresis with torsemide.   6/3: creatinine improved 1.11  --Reduced torsemide BID>>daily --Monitor volume status closely --Encourage PO hydration --Monitor BMP  Malignant neoplasm of upper-outer quadrant of left breast in female, estrogen receptor positive ([place]) Patient with a known history of left breast cancer status post lumpectomy and radiation treatment. Completed a 5 year course of Anastrazole  Diarrhea C. difficile and GI panel were negative on 5/26.  Imodium as needed.  Monitor volume status and electrolytes.  Morbid obesity with BMI of 50.0-59.9, adult (HCC) Body mass index is 53.45 kg/m. Complicates overall care and prognosis.  Recommend lifestyle modifications  including physical activity and diet for weight loss and overall long-term health.    Obesity hypoventilation syndrome (HCC) Chronic 2 L of oxygen and BiPAP at night  Generalized anxiety disorder Continue fluoxetine  Essential hypertension Continue metoprolol  COPD with chronic bronchitis and emphysema (HCC) Stable and not acutely exacerbated Continue as needed bronchodilator therapy  Chronic diastolic heart failure (HCC) Stable and not acutely exacerbated.  --Continue metoprolol and torsemide.   --Torsemide reduced to once daily --Monitor volume status        Subjective: Patient seen on rounds with both daughters and her daughter-in-law present at bedside.  They had read the MRI brain results prior to my encounter.  Daughter Daisy [name] who is POA has discussed with her sister and they are in agreement about patient returning home instead of going to SNF and attempting rehab.  They want her to enjoy what time she has left and not put her through the stress of rehab and SNF stay.  They are agreeable to DNR CODE STATUS and request hospital bed and wheelchair prior to discharge home with hospice.  Patient remains fairly confused but denies any acute complaints at this time.  Denies back pain at the moment.  Physical Exam: Vitals:   11/18/21 0016 11/18/21 0050 11/18/21 0459 11/18/21 1159  BP: 128/71 106/75 101/64 135/76  Pulse: (!) 114 (!) 110 (!) 110 (!) 108  Resp: 19 18    Temp: 98.3 F (36.8 C) 98 F (36.7 C) 97.8 F (36.6 C) 98.2 F (36.8 C)  TempSrc:  Oral    SpO2: 91% 91% 91% 93%  Weight:      Height:       General exam: awake resting in bed, alert, no acute distress, obese, confused HEENT: Moist mucous members, eyes mostly closed Respiratory system: Normal respiratory effort, on 2 L/min [place] O2 Cardiovascular system: No peripheral edema, RRR Central nervous system: Limited exam but patient seems alert and oriented with normal speech, mildly confused Extremities:  Normal tone, no cyanosis, no peripheral edema Psychiatry: normal mood, congruent affect, judgement and insight appear normal   Data Reviewed:  Notable labs: Bicarb 33, glucose 111, BUN 25, creatinine 1.34, GFR 42, white count improved to 13.0 from 14.4, platelets improved 141 from 125      Family Communication: Both daughters and daughter in law at bedside on rounds this morning  Disposition: Status is: Inpatient Remains inpatient appropriate because: Requires DME at home prior to discharge home with hospice    Planned Discharge Destination: Home with hospice likely tomorrow pending DME delivery    Time spent: 35 minutes  Author: Ezekiel Slocumb, DO 11/18/2021 4:20 PM  For on call review [email or web address].

## 2021-11-18 NOTE — Progress Notes (Signed)
PET approved Auth# [id number] valid 11/15/21-12/15/21 for [id number]

## 2021-11-18 NOTE — Progress Notes (Signed)
Daily Progress Note   Patient Name: Daisy [name]       Date: 11/18/2021 DOB: 11-18-1950  Age: 71 y.o. MRN#: [id number] Attending Physician: Ezekiel Slocumb, DO Primary Care Physician: Lesleigh Noe, MD Admit Date: 11/05/2021  Reason for Consultation/Follow-up: Establishing goals of care  Subjective: Daisy [name] is awake and alert today. She is not oriented. She is alert to conversation, but her answers and statements are often tangential to conversation.  Her family is at bedside. They have been informed of Daisy [name]'s unfortunate MRI results showing diffuse brain metastasis.  Per their discussion with primary attending, they have decided to take Daisy [name] with hospice.  We discussed hospice philosophy and services.  Also discussed symptom management for pain, anxiety, agitation, shortness of breath.   Length of Stay: 12  Current Medications: Scheduled Meds:   aspirin  81 mg Oral Daily   cholecalciferol  1,000 Units Oral Daily   cilostazol  50 mg Oral BID   enoxaparin (LOVENOX) injection  70 mg Subcutaneous Q24H   ezetimibe  10 mg Oral QHS   FLUoxetine  20 mg Oral Daily   fosfomycin  3 g Oral Once   metoprolol succinate  12.5 mg Oral Daily   pantoprazole  20 mg Oral Daily   potassium chloride SA  20 mEq Oral BID   sodium chloride flush  3 mL Intravenous Q12H   torsemide  20 mg Oral Daily   cyanocobalamin  1,000 mcg Oral Daily    Continuous Infusions:  sodium chloride      PRN Meds: sodium chloride, acetaminophen, cyclobenzaprine, HYDROcodone-acetaminophen, ipratropium-albuterol, loperamide, LORazepam, ondansetron **OR** ondansetron (ZOFRAN) IV, sodium chloride flush  Physical Exam Constitutional:      General: She is not in acute distress. Pulmonary:     Effort: Pulmonary effort is  normal.  Skin:    General: Skin is warm and dry.  Psychiatric:     Comments: Pleasantly confused            Vital Signs: BP 101/64 (BP Location: Right Leg)   Pulse (!) 110   Temp 97.8 F (36.6 C)   Resp 18   Ht 5\' 7"  (1.702 m)   Wt (!) 140.7 kg   SpO2 91%   BMI 48.58 kg/m  SpO2: SpO2: 91 % O2 Device: O2 Device: Nasal Cannula O2 Flow Rate: O2 Flow Rate (L/min): 3 L/min  Intake/output summary:  Intake/Output Summary (Last 24 hours) at 11/18/2021 1133 Last data filed at 11/17/2021 1900 Gross per 24 hour  Intake 610.44 ml  Output --  Net 610.44 ml    LBM: Last BM Date : 11/17/21 Baseline Weight: Weight: (!) 144.7 kg Most recent weight: Weight: (!) 140.7 kg       Palliative Assessment/Data: PPS 40%    Flowsheet Rows    Flowsheet Row Most Recent Value  Intake Tab   Referral Department Hospitalist  Unit at Time of Referral Med/Surg Unit  Palliative Care Primary Diagnosis Cancer  Date Notified 11/13/21  Palliative Care Type New Palliative care  Reason for referral Clarify Goals of Care  Date of Admission 11/05/21  Date first seen by Palliative Care 11/14/21  # of days Palliative referral response time 1  Day(s)  # of days IP prior to Palliative referral 8  Clinical Assessment   Psychosocial & Spiritual Assessment   Palliative Care Outcomes        Patient Active Problem List   Diagnosis Date Noted   Acute metabolic encephalopathy 67/59/1638   Diarrhea 11/14/2021   Leukocytosis 11/14/2021   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 11/06/2021   Metastatic primary lung cancer, left ([place]) 11/06/2021   Acute on chronic diastolic CHF (congestive heart failure) ([place]) 10/26/2021   Acute hypoxemic respiratory failure ([place]) 10/26/2021   possible pneumonia 10/26/2021   Pleural effusion on left 10/25/2021   Moderate episode of recurrent major depressive disorder ([place]) 12/24/2020   Generalized anxiety disorder 12/24/2020   Dysuria 03/26/2020   Chronic  bilateral low back pain without sciatica 03/26/2020   Well woman exam without gynecological exam 03/26/2020   Chronic pain of both knees 03/26/2020   Edema 01/27/2020   Morbid obesity with BMI of 50.0-59.9, adult ([place]) 01/04/2020   Prediabetes 01/04/2020   HLD (hyperlipidemia) 01/04/2020   Chronic diastolic heart failure ([place]) 01/04/2020   COPD with chronic bronchitis and emphysema ([place]) 11/30/2019   Chronic respiratory failure with hypoxia and hypercapnia ([place]) 11/04/2019   Acute on chronic congestive heart failure ([place]) 10/17/2019   Acute kidney injury superimposed on CKD ([place]) 10/17/2019   Essential hypertension 10/17/2019   UTI (urinary tract infection)    Malignant neoplasm of upper-outer quadrant of left breast in female, estrogen receptor positive ([place]) 09/30/2016   PAD (peripheral artery disease) ([place]) 04/04/2014   Obesity hypoventilation syndrome ([place]) 04/04/2014   GERD (gastroesophageal reflux disease) 04/04/2014    Palliative Care Assessment & Plan   HPI: 71 y.o. female  with past medical history of  breast cancer, GERD, arthritis, chronic diastolic congestive heart failure, chronic kidney disease, depression, hyperlipidemia, morbid obesity, and peripheral vascular disease  admitted on 11/05/2021 with back pain.  Found to have T12 compression fracture.  Then found to have mass in lung with liver, adrenal and bone mets.  Patient had kyphoplasty done 5/30.  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 PMT consulted to discuss goals of care.  Assessment: -New metastatic cancer diagnosis- likely lung primary- mets to liver, bones, adrenal glands, brain  Recommendations/Plan: Plan for home with hospice, symptom management Symptom management-  Brain mets with edema, bone mets- dexamethasone 10mg  x1 now, the 2mg  po daily Pain, SOB-  morphine liquid 10mg /mL- 2.5 mg po q2hr as needed Anxiety, agitation- lorazepam 1mg  po q4hours prn  Code  Status: DNR  Care plan was discussed with patient's daughters [name] and [name]  Thank you for allowing the Palliative Medicine Team to assist in the care of this patient.  Mariana Kaufman, AGNP-C Palliative Medicine  Please call Palliative Medicine team phone with any questions [phone]. For individual providers please see [facility].

## 2021-11-18 NOTE — TOC Progression Note (Addendum)
Transition of Care [facility]) - Progression Note    Patient Details  Name: Daisy [name] MRN: [id number] Date of Birth: 1950-12-14  Transition of Care [facility]) CM/SW [place], RN Phone Number: 11/18/2021, 3:59 PM  Clinical Narrative:  RNCM in to see pateint, family at bedside  Agree to hospice, Lorayne Bender will be in to see patient.   06/05 addendum 1602 patient and family agreed to use [facility] as their hospice provider.  Expected Discharge Plan: [place] Barriers to Discharge: Continued Medical Work up  Expected Discharge Plan and Services Expected Discharge Plan: [place]                                               Social Determinants of Health (SDOH) Interventions    Readmission Risk Interventions    10/29/2021   10:10 AM  Readmission Risk Prevention Plan  Transportation Screening Complete  PCP or Specialist Appt within 5-7 Days Complete  Home Care Screening Complete  Medication Review (RN CM) Referral to Pharmacy

## 2021-11-18 NOTE — Assessment & Plan Note (Addendum)
Decision made this morning 6/5 to pursue hospice care at home. --CODE STATUS changed DNR -- DME ordered: Hospital bed, wheelchair -- Hospice on board to follow closely after discharge.

## 2021-11-18 NOTE — Care Management Important Message (Signed)
Important Message  Patient Details  Name: Daisy [name] MRN: [id number] Date of Birth: Jan 17, 1951   Medicare Important Message Given:  Other (see comment)  Patient is going to discharge home with Hospice. Out of respect for the patient and family no Important Message from [facility] given.    Juliann Pulse A Merin Borjon 11/18/2021, 2:53 PM

## 2021-11-19 ENCOUNTER — Encounter: Payer: Self-pay | Admitting: Family Medicine

## 2021-11-19 ENCOUNTER — Telehealth: Payer: Self-pay | Admitting: *Deleted

## 2021-11-19 DIAGNOSIS — R918 Other nonspecific abnormal finding of lung field: Secondary | ICD-10-CM | POA: Diagnosis not present

## 2021-11-19 DIAGNOSIS — S22080K Wedge compression fracture of T11-T12 vertebra, subsequent encounter for fracture with nonunion: Secondary | ICD-10-CM | POA: Diagnosis not present

## 2021-11-19 DIAGNOSIS — Z515 Encounter for palliative care: Secondary | ICD-10-CM | POA: Diagnosis not present

## 2021-11-19 DIAGNOSIS — C7931 Secondary malignant neoplasm of brain: Secondary | ICD-10-CM | POA: Diagnosis not present

## 2021-11-19 DIAGNOSIS — S22000A Wedge compression fracture of unspecified thoracic vertebra, initial encounter for closed fracture: Secondary | ICD-10-CM

## 2021-11-19 DIAGNOSIS — C3492 Malignant neoplasm of unspecified part of left bronchus or lung: Secondary | ICD-10-CM | POA: Diagnosis not present

## 2021-11-19 LAB — CULTURE, BLOOD (SINGLE)
Culture: NO GROWTH
Special Requests: ADEQUATE

## 2021-11-19 MED ORDER — MORPHINE SULFATE 20 MG/5ML PO SOLN: 2.5000 mg | ORAL | 0 refills | Status: AC | PRN

## 2021-11-19 MED ORDER — LOPERAMIDE HCL 2 MG PO CAPS: 2.0000 mg | ORAL_CAPSULE | ORAL | 0 refills | Status: AC | PRN

## 2021-11-19 MED ORDER — HYDROCODONE-ACETAMINOPHEN 5-325 MG PO TABS
1.0000 | ORAL_TABLET | ORAL | 0 refills | Status: AC | PRN
Start: 2021-11-19 — End: ?

## 2021-11-19 MED ORDER — TORSEMIDE 20 MG PO TABS: 20.0000 mg | ORAL_TABLET | Freq: Every day | ORAL | Status: DC

## 2021-11-19 MED ORDER — FLUOXETINE HCL 20 MG PO CAPS: 20.0000 mg | ORAL_CAPSULE | Freq: Every day | ORAL | Status: DC

## 2021-11-19 MED ORDER — TORSEMIDE 10 MG PO TABS: 20.0000 mg | ORAL_TABLET | Freq: Every day | ORAL | 0 refills | Status: AC | PRN

## 2021-11-19 MED ORDER — LORAZEPAM 1 MG PO TABS: 1.0000 mg | ORAL_TABLET | ORAL | 0 refills | Status: AC | PRN

## 2021-11-19 MED ORDER — CYCLOBENZAPRINE HCL 5 MG PO TABS: 5.0000 mg | ORAL_TABLET | Freq: Three times a day (TID) | ORAL | 0 refills | Status: AC | PRN

## 2021-11-19 MED ORDER — ONDANSETRON HCL 4 MG PO TABS: 4.0000 mg | ORAL_TABLET | Freq: Four times a day (QID) | ORAL | 0 refills | Status: AC | PRN

## 2021-11-19 MED ORDER — DEXAMETHASONE 2 MG PO TABS: 2.0000 mg | ORAL_TABLET | Freq: Every day | ORAL | 0 refills | Status: AC

## 2021-11-19 MED ORDER — MORPHINE SULFATE 10 MG/5ML PO SOLN: 2.5000 mg | ORAL | 0 refills | Status: DC | PRN

## 2021-11-19 NOTE — TOC Progression Note (Signed)
Transition of Care [facility]) - Progression Note    Patient Details  Name: Daisy [name] MRN: [id number] Date of Birth: [birth year]  Transition of Care [facility] CM/SW [place], RN Phone Number: 11/19/2021, 10:50 AM  Clinical Narrative:   Patient going to daughter's home with hospice.  Awaiting delivery of equipment and then patient will transfer home.    Expected Discharge Plan: [place] Barriers to Discharge: Continued Medical Work up  Expected Discharge Plan and Services Expected Discharge Plan: [place]                                               Social Determinants of Health (SDOH) Interventions    Readmission Risk Interventions    10/29/2021   10:10 AM  Readmission Risk Prevention Plan  Transportation Screening Complete  PCP or Specialist Appt within 5-7 Days Complete  Home Care Screening Complete  Medication Review (RN CM) Referral to Pharmacy

## 2021-11-19 NOTE — Progress Notes (Signed)
PT REFUSED CPAP for HS. Placed on 4L [place] with bed elevated, stable at this time.

## 2021-11-19 NOTE — Telephone Encounter (Signed)
Call returned to [facility] and informed that Dr Grayland Ormond agrees to serve as attending and patient is appropriate

## 2021-11-19 NOTE — Telephone Encounter (Signed)
Call from hospice asking if Dr Grayland Ormond will serve as hospice attending, sign orders and verify that patient is hospice appropriate Please advise

## 2021-11-19 NOTE — Progress Notes (Addendum)
PT Cancellation Note  Patient Details Name: Daisy [name] MRN: [id number] DOB: 1950/12/30   Cancelled Treatment:    Reason Eval/Treat Not Completed: Fatigue/lethargy limiting ability to participate;Patient's level of consciousness (Chart reviewed, RN consulted. Treatment attempted. P tmore confused and sleepy today, currently sleeping. Family/RN questioning whether related to meds.) Family is agreeable for therapy services to continue to be utilized ot maximize pt's independence with basic mobility, postural strength in hopes to maximize quality of life and decrease caregiver burden upon DC. Plan remains for DC to home with hospice services.   Evaline Waltman C 11/19/2021, 1:48 PM

## 2021-11-19 NOTE — TOC Progression Note (Signed)
Transition of Care [facility]) - Progression Note    Patient Details  Name: Daisy [name] MRN: [id number] Date of Birth: 18-Nov-1950  Transition of Care [facility]) CM/SW [place], RN Phone Number: 11/19/2021, 1:36 PM  Clinical Narrative:   Patient discharging today at 46, Shania from [place] confirmed delivery of DME at patient's home and has contacted EMS for a pickup at 1545.    Expected Discharge Plan: Skilled Nursing Facility Barriers to Discharge: Continued Medical Work up  Expected Discharge Plan and Services Expected Discharge Plan: [place]         Expected Discharge Date: 11/19/21                                     Social Determinants of Health (SDOH) Interventions    Readmission Risk Interventions    10/29/2021   10:10 AM  Readmission Risk Prevention Plan  Transportation Screening Complete  PCP or Specialist Appt within 5-7 Days Complete  Home Care Screening Complete  Medication Review (RN CM) Referral to Pharmacy

## 2021-11-19 NOTE — Discharge Summary (Signed)
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 Active Problems:   Hospice care patient   UTI (urinary tract infection)   Metastatic primary lung cancer, left (HCC)   Leukocytosis   Malignant neoplasm of upper-outer quadrant of left breast in female, estrogen receptor positive ([place])   Acute metabolic encephalopathy   Obesity hypoventilation syndrome (HCC)   Morbid obesity with BMI of 50.0-59.9, adult (HCC)   Generalized anxiety disorder   Essential hypertension   COPD with chronic bronchitis and emphysema (HCC)   Chronic diastolic heart failure (HCC)   DNR (do not resuscitate)  Resolved Problems:   Acute kidney injury superimposed on CKD [facility])   Diarrhea  Hospital Course: 71 year old female with past medical history of breast cancer, GERD, arthritis, chronic diastolic congestive heart failure, chronic kidney disease, depression, hyperlipidemia, morbid obesity, peripheral vascular disease presented to the hospital with back pain.  On imaging studies she was diagnosed with a T12 compression fracture.  She was also found to have a mass of the lung with liver metastases, adrenal metastases and osseous metastases.  The patient was seen in consultation by oncology, interventional radiology, neurosurgery and pulmonary.  Kyphoplasty delayed until insurance authorization was received.  Liver biopsy and kyphoplasty done on 11/12/2021.  Palliative care consulted 5/31 at request of patient's daughter.  Initially PT/OT were recommending home health, but due to progressive weakness, now recommend SNF.  TOC following.   Hospital course  complicated by UTI.  Started on Rocephin, cultures later grew Pseudomonas, changed antibiotic to Cefepime on 6/4.  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  Status post kyphoplasty by IR on 11/12/2021.   -- Brace when out of bed -- Pain control as needed  Hospice care patient Decision made this morning 6/5 to pursue hospice care at home. --CODE STATUS changed DNR -- DME ordered: Hospital bed, wheelchair -- Hospice on board to follow closely after discharge.  Leukocytosis Likely due to UTI versus possibly reactive in setting of compression fracture and procedures including kyphoplasty liver biopsy.  White count improved w antibiotics for UTI.  Metastatic primary lung cancer, left [facility]) Patient has a mass of the lung, liver, adrenals and osseous metastases.  Patient does have a history of breast cancer. Follows with Dr. Grayland Ormond.   5/30 Liver and T12 bone biopsies obtained.  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  UA with moderate leukocytes, 21-50 WBCs and rare bacteria. Started on empiric Rocephin 6/4: urine culture growing Pseudomonas, changed antibiotic to cefepime 6/5: Urine culture susceptibilities returned, will complete treatment with single dose fosfomycin today.    Acute metabolic encephalopathy Suspect this is due to UTI.  Patient has waxing and waning episodes of confusion.  Daughters reports she is cognitively sharp at baseline, no memory issues. -- Treated for UTI as outlined --  MRI brain showed metastatic disease. Patient has had slowly  increasing confusion over past several days.  Malignant neoplasm of upper-outer quadrant of left breast in female, estrogen receptor positive ([place]) Patient with a known history of left breast cancer status post lumpectomy and radiation treatment. Completed a 5 year course of Anastrazole  Acute kidney injury superimposed on CKD (HCC)-resolved as of 11/19/2021 Acute kidney injury on CKD stage IIIa.  Creatinine improved from 1.48 down to 1.10 even with diuresis with torsemide.    --Reduced torsemide BID>>daily --Will continue torsemide daily PRN for swelling at discharge after discussing with family  Morbid obesity with BMI of 50.0-59.9, adult ([place]) Body mass index is 53.45 kg/m. Complicates overall care and prognosis.  Recommend lifestyle modifications including physical activity and diet for weight loss and overall long-term health.    Obesity hypoventilation syndrome (HCC) Chronic 2 L of oxygen and BiPAP at night  Diarrhea-resolved as of 11/19/2021 C. difficile and GI panel were negative on 5/26.  Imodium as needed.  Generalized anxiety disorder Continue fluoxetine at this time per pt and family  Essential hypertension Stop metoprolol  COPD with chronic bronchitis and emphysema ([place]) Stable and not acutely exacerbated Continue as needed bronchodilator therapy  DNR (do not resuscitate) DNR form signed and in chart.  Chronic diastolic heart failure (HCC) Stable and not acutely exacerbated.  --Stopped metoprolol per discussion w family --Torsemide daily PRN at d/c          Consultants: Oncology, Palliative Care, IR Procedures performed: Kyphoplasty   Disposition: Hospice care  Diet recommendation:  Regular diet   DISCHARGE MEDICATION: Allergies as of 11/19/2021       Reactions   Elemental Sulfur Hives   Levaquin [levofloxacin] Other (See Comments)   Body aches   Statins         Medication List     STOP taking these medications    aspirin 81 MG chewable  tablet   cholecalciferol 25 MCG (1000 UNIT) tablet Commonly known as: VITAMIN D3   cilostazol 50 MG tablet Commonly known as: PLETAL   cyanocobalamin 1000 MCG tablet   dapagliflozin propanediol 5 MG Tabs tablet Commonly known as: FARXIGA   Klor-Con M20 20 MEQ tablet Generic drug: potassium chloride SA   metoprolol succinate 25 MG 24 hr tablet Commonly known as: TOPROL-XL   Nexlizet 180-10 MG Tabs Generic drug: Bempedoic Acid-Ezetimibe   predniSONE 20 MG tablet Commonly known as: DELTASONE       TAKE these medications    acetaminophen 325 MG tablet Commonly known as: TYLENOL Take 2 tablets (650 mg total) by mouth every 4 (four) hours as needed for headache or mild pain.   albuterol 108 (90 Base) MCG/ACT inhaler Commonly known as: VENTOLIN HFA TAKE 2 PUFFS BY MOUTH EVERY 6 HOURS AS NEEDED FOR WHEEZE OR SHORTNESS OF BREATH What changed: See the new instructions.   cyclobenzaprine 5 MG tablet Commonly known as: FLEXERIL Take 1 tablet (5 mg total) by mouth 3 (three) times daily as needed for muscle spasms.   dexamethasone 2 MG tablet Commonly known as: DECADRON Take 1 tablet (2 mg total) by mouth daily. Start taking on: November 20, 2021   FLUoxetine 20 MG capsule Commonly known as: PROZAC TAKE 1 CAPSULE BY MOUTH EVERY DAY What changed: how much to take   HYDROcodone-acetaminophen 5-325 MG tablet Commonly known as: NORCO/VICODIN Take 1 tablet by mouth every 4 (four) hours as needed for moderate pain or severe pain.   loperamide 2 MG capsule  Commonly known as: IMODIUM Take 1 capsule (2 mg total) by mouth as needed for diarrhea or loose stools.   LORazepam 1 MG tablet Commonly known as: ATIVAN Take 1 tablet (1 mg total) by mouth every 4 (four) hours as needed for anxiety or sleep (for anxiety or agitation).   morphine 20 MG/5ML solution Take 0.6-2.5 mLs (2.4-10 mg total) by mouth every 2 (two) hours as needed (moderate-severe pain or shortness of breath).    ondansetron 4 MG tablet Commonly known as: ZOFRAN Take 1 tablet (4 mg total) by mouth every 6 (six) hours as needed for nausea.   pantoprazole 20 MG tablet Commonly known as: PROTONIX Take 1 tablet (20 mg total) by mouth daily.   torsemide 10 MG tablet Commonly known as: DEMADEX Take 2 tablets (20 mg total) by mouth daily as needed (swelling or shortness of breath). What changed:  when to take this reasons to take this               Durable Medical Equipment  (From admission, onward)           Start     Ordered   11/19/21 0813  For home use only DME standard manual wheelchair with seat cushion  Once       Comments: Bariatric size wheelchair.  Patient suffers from metastatic lung cancer result in generalized weakness which impairs their ability to perform daily activities like bathing, dressing, grooming in the home.  A cane, crutch, or walker will not resolve issue with performing activities of daily living. A wheelchair will allow patient to safely perform daily activities. Patient can safely propel the wheelchair in the home or has a caregiver who can provide assistance. Length of need 6 months. Accessories: elevating leg rests (ELRs), wheel locks, extensions and anti-tippers.   11/19/21 [id number]   11/19/21 0813  For home use only DME Hospital bed  Once       Comments: Bariatric hospital bed needed  Question Answer Comment  Length of Need 6 Months   Patient has (list medical condition): Metastatic lung cancer   The above medical condition requires: Patient requires the ability to reposition frequently   Bed type Semi-electric      11/19/21 0812            Follow-up Information     Criselda Peaches, MD Follow up.   Specialties: Interventional Radiology, Radiology Contact information: [address] [place] [address] [phone]                Discharge Exam: Filed Weights   11/15/21 0500 11/17/21 0500 11/19/21 0500  Weight:  (!) 141.7 kg (!) 140.7 kg 136 kg   General exam: awake, alert, no acute distress, obese, confused HEENT: moist mucus membranes, hearing grossly normal  Respiratory system: normal respiratory effort. Cardiovascular system: radial pulse 2+ RRR, no pedal edema.   Gastrointestinal system: soft, NT, ND Central nervous system: A&O x self. no gross focal neurologic deficits, normal speech Extremities: no edema, normal tone Psychiatry: normal mood, congruent affect, abnormal judgement and insight, confused   Condition at discharge: stable  The results of significant diagnostics from this hospitalization (including imaging, microbiology, ancillary and laboratory) are listed below for reference.   Imaging Studies: DG Chest 2 View  Result Date: 11/05/2021 CLINICAL DATA:  Productive cough, wheezing EXAM: CHEST - 2 VIEW COMPARISON:  10/25/2021 FINDINGS: Left basilar opacity persists, likely representing a lungs are otherwise clear. No pneumothorax. Cardiac size is  mildly enlarged. Pulmonary vascularity is normal. No acute bone abnormality. Moderate left pleural effusion with associated left basilar atelectasis or infiltrate. IMPRESSION: Stable moderate left pleural effusion with associated left basilar atelectasis or infiltrate. Given its persistence, CT imaging with contrast would be helpful to exclude the presence of a chronic infiltrate within the left lung base. Electronically Signed   By: Fidela Salisbury M.D.   On: 11/05/2021 19:51   DG Chest 2 View  Result Date: 10/25/2021 CLINICAL DATA:  Shortness of breath, hypoxia, chest congestion EXAM: CHEST - 2 VIEW COMPARISON:  10/24/2019 FINDINGS: Enlargement of cardiac silhouette with pulmonary vascular congestion. Mediastinal contours normal with atherosclerotic calcifications aorta. LEFT pleural effusion and basilar atelectasis versus consolidation. Remaining lungs clear. No pneumothorax or acute osseous findings. IMPRESSION: Enlargement of cardiac  silhouette with pulmonary vascular congestion. LEFT basilar opacity in consistent with LEFT pleural effusion and associated atelectasis versus consolidation LEFT lower lobe. Aortic Atherosclerosis (ICD10-I70.0). Electronically Signed   By: Lavonia Dana M.D.   On: 10/25/2021 15:00   DG Lumbar Spine 2-3 Views  Result Date: 10/26/2021 CLINICAL DATA:  Low back pain. EXAM: LUMBAR SPINE - 2-3 VIEW COMPARISON:  12/26/2008. FINDINGS: There is no evidence of acute lumbar spine fracture. Alignment is normal. Multilevel intervertebral disc space narrowing, degenerative endplate osteophyte formation, and facet arthropathy is noted. There is atherosclerotic calcification of the abdominal aorta. IMPRESSION: Moderate degenerative changes in the lumbar spine. No evidence of acute fracture. Electronically Signed   By: Brett Fairy M.D.   On: 10/26/2021 21:42   MR BRAIN W WO CONTRAST  Result Date: 11/18/2021 CLINICAL DATA:  Initial evaluation for small cell lung cancer, staging. EXAM: MRI HEAD WITHOUT AND WITH CONTRAST TECHNIQUE: Multiplanar, multiecho pulse sequences of the brain and surrounding structures were obtained without and with intravenous contrast. CONTRAST:  45mL GADAVIST GADOBUTROL 1 MMOL/ML IV SOLN COMPARISON:  Prior CT from 10/18/2019. FINDINGS: Brain: Examination moderately degraded by motion artifact. Cerebral volume within normal limits. Patchy and confluent T2/FLAIR hyperintensity involving the periventricular and deep white matter both cerebral hemispheres, most consistent with chronic small vessel ischemic disease, moderately advanced in nature. Small remote lacunar infarcts at the left caudate. There are innumerable scattered metastatic lesions seen throughout both cerebral hemispheres. For reference purposes, the largest lesion is seen at the anterior left temporal lobe in measures 1.7 x 2.2 cm (series 7, image 11). This lesion demonstrates prominent susceptibility artifact, and could reflect parenchymal  hemorrhage related to an underlying lesion. Mild localized edema without significant regional mass effect. Mild susceptibility artifacts seen about a few additional lesions elsewhere within the brain, suggesting blood products. The largest lesion within the right cerebral hemisphere is seen at the right frontal operculum and measures 9 mm (series 13, image 16). Asymmetric dural thickening and enhancement noted overlying the right cerebral convexity. There is at least 1 infratentorial lesion at the central cerebellum measuring 7 mm (series 12, image 6). No other significant regional mass effect or midline shift. No acute vascular territory infarct. No midline shift or hydrocephalus. No extra-axial fluid collection. Pituitary gland suprasellar region normal. Vascular: Major intracranial vascular flow voids are maintained. Skull and upper cervical spine: Craniocervical junction within normal limits. Innumerable scattered osseous metastatic lesions also noted throughout the calvarium and visualized skull base, most evident on diffusion-weighted sequences. No significant or visible extra osseous component seen on this motion degraded exam. Sinuses/Orbits: Globes and orbital soft tissues within normal limits. Paranasal sinuses are largely clear. No mastoid effusion. Other: None. IMPRESSION: 1.  Innumerable intracranial metastatic lesions as above. Note made of a 2.2 cm lesion/area at the anterior left temporal lobe with associated susceptibility artifact and localized edema, suggesting associated hemorrhage and/or hemorrhagic metastasis. Correlation with dedicated noncontrast head CT could be performed for further evaluation as warranted. 2. Widespread osseous metastatic disease throughout the calvarium and visualized skull base. 3. Underlying moderately advanced chronic microvascular ischemic disease. Electronically Signed   By: Jeannine Boga M.D.   On: 11/18/2021 00:44   MR THORACIC SPINE W WO CONTRAST  Result  Date: 11/06/2021 CLINICAL DATA:  Bone lesion, thoracic spine.  Compression fracture. EXAM: MRI THORACIC WITHOUT AND WITH CONTRAST TECHNIQUE: Multiplanar and multiecho pulse sequences of the thoracic spine were obtained without and with intravenous contrast. CONTRAST:  38mL GADAVIST GADOBUTROL 1 MMOL/ML IV SOLN COMPARISON:  CT chest, abdomen, and pelvis 11/06/2021 FINDINGS: Alignment:  Trace anterolisthesis of T4 on T5. Vertebrae: Widespread T1 hypointense, enhancing lesions at every level in the thoracic spine involving the vertebral bodies and posterior elements. Pathologic T12 compression fracture with 30% vertebral body height loss. Mild bulging of the posterior cortex of the T12 vertebral body with suspected small volume ventral epidural tumor not resulting in significant spinal stenosis or spinal cord mass effect. Widespread bone lesions throughout the visualized portions of the cervical spine, lumbar spine, and pelvis. Cord:  Normal cord signal.  No abnormal intradural enhancement. Paraspinal and other soft tissues: Obstructing left lower lobe pulmonary mass, mediastinal lymphadenopathy, and bilateral adrenal masses as shown on today's earlier CT. Asymmetric left renal atrophy. Disc levels: Diffuse thoracic disc and facet degeneration. Central/paracentral disc protrusions from T5-6 to T10-11, largest at T5-6 and T8-9 where there is mild ventral cord flattening without significant generalized spinal stenosis. IMPRESSION: 1. Widespread osseous metastases throughout the spine and visualized pelvis. 2. Pathologic T12 compression fracture with 30% height loss and suspected small volume ventral epidural tumor. No significant spinal stenosis. 3. Diffuse thoracic disc and facet degeneration as above. Electronically Signed   By: Logan Bores M.D.   On: 11/06/2021 15:06   CT CHEST ABDOMEN PELVIS W CONTRAST  Result Date: 11/06/2021 CLINICAL DATA:  Sepsis, diarrhea EXAM: CT CHEST, ABDOMEN, AND PELVIS WITH CONTRAST  TECHNIQUE: Multidetector CT imaging of the chest, abdomen and pelvis was performed following the standard protocol during bolus administration of intravenous contrast. RADIATION DOSE REDUCTION: This exam was performed according to the departmental dose-optimization program which includes automated exposure control, adjustment of the mA and/or kV according to patient size and/or use of iterative reconstruction technique. CONTRAST:  168mL OMNIPAQUE IOHEXOL 300 MG/ML  SOLN COMPARISON:  None Available. FINDINGS: CT CHEST FINDINGS Cardiovascular: Moderate coronary artery calcification. Left anterior descending coronary artery stenting has been performed. Global cardiac size within normal limits. No pericardial effusion. Central pulmonary arteries are enlarged in keeping with changes of pulmonary arterial hypertension. Moderate atherosclerotic calcification within the thoracic aorta. No aortic aneurysm. Mediastinum/Nodes: There is pathologic mediastinal adenopathy within the prevascular, right paratracheal, subcarinal, and aortopulmonary lymph node groups. Index lymph node measures 1.8 x 2.7 cm at axial image # 21/2 within the right paratracheal lymph node group. Visualized thyroid is unremarkable. Esophagus unremarkable. Lungs/Pleura: There is a a central pulmonary mass within the left lower lobe which is poorly delineated due to the adjacent collapsed lung, but results in obliteration of the left lower lobar pulmonary bronchus and narrowing of the left inferior pulmonary vein. This measures roughly 2.7 x 3.8 cm at axial image # 37/2. There is a suspected pleural metastasis within the  left cardiophrenic angle seen at axial image # 43/2 and inferiorly at axial image # 49/2. Small left pleural effusion is present. Nodular pleural thickening anteriorly within the left costophrenic sulcus anteriorly. Left upper lobe and right lung are clear. No pneumothorax. No pleural effusion on the right. Musculoskeletal: No acute bone  abnormality. No lytic or blastic bone lesion. Surgical clips are seen within the left breast. CT ABDOMEN PELVIS FINDINGS Hepatobiliary: Innumerable hypoenhancing masses are seen throughout the liver in keeping with widespread hepatic metastases. Mild resultant hepatomegaly. Cholecystectomy has been performed. No intra or extrahepatic biliary ductal dilation. Pancreas: Unremarkable Spleen: Unremarkable Adrenals/Urinary Tract: Bilateral adrenal masses are identified in keeping with bilateral adrenal metastases. The kidneys are normal in position. Moderate right and moderate to severe left renal cortical atrophy. No hydronephrosis. No intrarenal or ureteral calculi. No enhancing intrarenal masses. The bladder is unremarkable. Stomach/Bowel: Moderate sigmoid diverticulosis. The stomach, large bowel, and small bowel are otherwise unremarkable. Appendix normal. No free intraperitoneal gas or fluid. Vascular/Lymphatic: There is extensive aortoiliac atherosclerotic calcification. No aortic aneurysm. Pathologic periportal lymph node is seen measuring 18 mm in short axis diameter at axial image # 60/2. Reproductive: Status post hysterectomy. No adnexal masses. Other: No abdominal wall hernia. Musculoskeletal: No lytic or blastic bone lesion. There is a a acute to subacute superior endplate fracture of G99 with approximately 10-20% loss of height. No retropulsion. IMPRESSION: Central obstructing mass within the left lower lobe, poorly delineated, most in keeping with a primary pulmonary malignancy with resultant obliteration of the left lower lobar pulmonary bronchus and narrowing of the left inferior pulmonary vein. Resultant collapse of the left lower lobe. 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 PET CT examination would be helpful to better delineate the primary mass and  confirm the presence of pleural metastatic disease. Hepatic metastases should be easily amenable to ultrasound-guided biopsy for further evaluation. Moderate coronary artery calcification. Morphologic changes in keeping with pulmonary arterial hypertension. Moderate sigmoid diverticulosis. Acute to subacute superior endplate fracture of M42 with 10-20% loss of height. No retropulsion. This could be confirmed with MRI examination. Aortic Atherosclerosis (ICD10-I70.0). Electronically Signed   By: Fidela Salisbury M.D.   On: 11/06/2021 01:08   IR Bone Tumor(s)RF Ablation  Result Date: 11/12/2021 CLINICAL DATA:  71 year old female with pathologic symptomatic T12 compression fracture. She presents for transpedicular bone biopsy, radiofrequency ablation and cement augmentation with balloon kyphoplasty. EXAM: FLUOROSCOPIC GUIDED T12 VERTEBRAL BODY BIOPSY RF ABLATION AND KYPHOPLASTY/CEMENT AUGMENTATION. COMPARISON:  None Available. MEDICATIONS: Ancef 2 g IV; The antibiotic was administered in an appropriate time interval prior to needle puncture of the skin. ANESTHESIA/SEDATION: Versed 3 mg IV; Fentanyl 150 mcg IV Moderate Sedation Time: 75 minutes; The patient was continuously monitored during the procedure by the interventional radiology nurse under my direct supervision. FLUOROSCOPY TIME:  Radiation exposure index: 167.5 mGy reference air kerma COMPLICATIONS: None immediate. TECHNIQUE: Informed written consent was obtained from the patient after a thorough discussion of the procedural risks, benefits and alternatives. All questions were addressed. Maximal Sterile Barrier Technique was utilized including caps, mask, sterile gowns, sterile gloves, sterile drape, hand hygiene and skin antiseptic. A timeout was performed prior to the initiation of the procedure. The patient was placed prone on the fluoroscopic table. The skin overlying the thoracic region was then prepped and draped in the usual sterile fashion. Maximal  barrier sterile technique was utilized including caps, mask, sterile gowns, sterile  gloves, sterile drape, hand hygiene and skin antiseptic. Intravenous Fentanyl and Versed were administered as conscious sedation during continuous cardiorespiratory monitoring by the radiology RN. The left pedicle at T12 was then infiltrated with 1% lidocaine followed by the advancement of a Kyphon trocar needle through the left pedicle into the posterior one-third of the vertebral body. A biopsy specimen was then obtained. Subsequently, the osteo drill was advanced to the anterior third of the vertebral body. The osteo drill was retracted. Through the working cannula, a 15 mm OsteoCool RF ablation probe was inserted and positioned under fluoroscopic guidance. In similar fashion, the right T12 pedicle was infiltrated with 1% lidocaine. Utilizing a extra pedicular approach, a second Kyphon trocar needle was advanced into the posterior third of the vertebral body. A biopsy specimen was then obtained. Subsequently, the osteo drill was coaxially advanced to the anterior right third. The osteo drill was exchanged for a 15 mm OsteoCool RF ablation probe which was positioned under fluoroscopic guidance. With both OsteoCool ablation probes in place, the ablation was performed for 11 minutes. Attention was now paid towards the kyphoplasty portion of the procedure. Beginning at the T12 vertebral body level, a Kyphon inflatable bone tamp 15 x 2.5 was advanced through both working cannulas and positioned with the distal marker approximately 5 mm from the anterior aspect of the cortex. Appropriate positioning was confirmed on the AP projection. At this time, the balloon was expanded using contrast via a Kyphon inflation syringe device via micro tubing. Inflations were continued under direct fluoroscopic guidance. At this time, methylmethacrylate mixture was reconstituted in the Kyphon bone mixing device system. This was then loaded into the  delivery mechanism, attached to Kyphon bone fillers. The balloons were deflated and removed followed by the instillation of methylmethacrylate mixture with excellent filling in the AP and lateral projections. The working cannulae and the bone filler were then retrieved and removed. Multiple spot radiographic images were obtained in various obliquities. Hemostasis was achieved with manual compression. The patient tolerated the procedure well without immediate postprocedural complication. FINDINGS: Completion images demonstrate a technically excellent result with adequate cement filling of the T12 vertebral body on both the AP and lateral projections. No extravasation was noted in the disk spaces or posteriorly into the spinal canal. No epidural venous contamination was seen. IMPRESSION: Technically successful T12 vertebral body biopsy, ablation and cement augmentation using balloon kyphoplasty. PLAN: The patient will be seen for clinical follow-up at the interventional radiology clinic in 2-4 weeks. Signed, Criselda Peaches, MD [facility] Rad[facility] Electronically Signed   By: Jacqulynn Cadet M.D.   On: 11/12/2021 15:57   IR US Guide Bx Asp/Drain  Result Date: 11/12/2021 INDICATION: 71 year old female with lung mass and advanced metastatic disease including multiple liver lesions. Findings are concerning for possible primary bronchogenic carcinoma with metastatic disease. Patient presents for ultrasound-guided core biopsy of liver mass to establish tissue diagnosis as well as osteo cool procedure of pathologic fracture at T12. EXAM: Ultrasound-guided core biopsy of liver mass MEDICATIONS: None. ANESTHESIA/SEDATION: Total sedation time reported in kyphoplasty procedure. FLUOROSCOPY TIME:  None. COMPLICATIONS: None immediate. PROCEDURE: Informed written consent was obtained from the patient after a thorough discussion of the procedural risks, benefits and  alternatives. All questions were addressed. Maximal Sterile Barrier Technique was utilized including caps, mask, sterile gowns, sterile gloves, sterile drape, hand hygiene and skin antiseptic. A timeout was performed prior to the initiation of the procedure. Ultrasound was used to interrogate the liver. Multiple  hypoechoic masses are identified. A suitable mass was selected for biopsy. The skin was sterilely prepped and draped in the standard fashion using chlorhexidine skin prep. Local anesthesia was attained by infiltration with 1% lidocaine. A small dermatotomy was made. An 18 gauge trocar needle was then advanced through the skin, into the liver and positioned at the margin of the liver mass. Multiple 18 gauge core biopsies were then obtained coaxially using the bio Pince automated biopsy device. Biopsy specimens were placed in formalin and delivered to pathology for further analysis. As the introducer needle was removed, the biopsy tract was embolized with a Gel-Foam slurry. Post biopsy imaging demonstrates no evidence of immediate complication. The patient tolerated the procedure well. IMPRESSION: Successful ultrasound-guided core biopsy of liver mass. Electronically Signed   By: Jacqulynn Cadet M.D.   On: 11/12/2021 15:18   IR KYPHO THORACIC WITH BONE BIOPSY  Result Date: 11/12/2021 CLINICAL DATA:  71 year old female with pathologic symptomatic T12 compression fracture. She presents for transpedicular bone biopsy, radiofrequency ablation and cement augmentation with balloon kyphoplasty. EXAM: FLUOROSCOPIC GUIDED T12 VERTEBRAL BODY BIOPSY RF ABLATION AND KYPHOPLASTY/CEMENT AUGMENTATION. COMPARISON:  None Available. MEDICATIONS: Ancef 2 g IV; The antibiotic was administered in an appropriate time interval prior to needle puncture of the skin. ANESTHESIA/SEDATION: Versed 3 mg IV; Fentanyl 150 mcg IV Moderate Sedation Time: 75 minutes; The patient was continuously monitored during the procedure by the  interventional radiology nurse under my direct supervision. FLUOROSCOPY TIME:  Radiation exposure index: 167.5 mGy reference air kerma COMPLICATIONS: None immediate. TECHNIQUE: Informed written consent was obtained from the patient after a thorough discussion of the procedural risks, benefits and alternatives. All questions were addressed. Maximal Sterile Barrier Technique was utilized including caps, mask, sterile gowns, sterile gloves, sterile drape, hand hygiene and skin antiseptic. A timeout was performed prior to the initiation of the procedure. The patient was placed prone on the fluoroscopic table. The skin overlying the thoracic region was then prepped and draped in the usual sterile fashion. Maximal barrier sterile technique was utilized including caps, mask, sterile gowns, sterile gloves, sterile drape, hand hygiene and skin antiseptic. Intravenous Fentanyl and Versed were administered as conscious sedation during continuous cardiorespiratory monitoring by the radiology RN. The left pedicle at T12 was then infiltrated with 1% lidocaine followed by the advancement of a Kyphon trocar needle through the left pedicle into the posterior one-third of the vertebral body. A biopsy specimen was then obtained. Subsequently, the osteo drill was advanced to the anterior third of the vertebral body. The osteo drill was retracted. Through the working cannula, a 15 mm OsteoCool RF ablation probe was inserted and positioned under fluoroscopic guidance. In similar fashion, the right T12 pedicle was infiltrated with 1% lidocaine. Utilizing a extra pedicular approach, a second Kyphon trocar needle was advanced into the posterior third of the vertebral body. A biopsy specimen was then obtained. Subsequently, the osteo drill was coaxially advanced to the anterior right third. The osteo drill was exchanged for a 15 mm OsteoCool RF ablation probe which was positioned under fluoroscopic guidance. With both OsteoCool ablation  probes in place, the ablation was performed for 11 minutes. Attention was now paid towards the kyphoplasty portion of the procedure. Beginning at the T12 vertebral body level, a Kyphon inflatable bone tamp 15 x 2.5 was advanced through both working cannulas and positioned with the distal marker approximately 5 mm from the anterior aspect of the cortex. Appropriate positioning was confirmed on the AP projection. At this time, the  balloon was expanded using contrast via a Kyphon inflation syringe device via micro tubing. Inflations were continued under direct fluoroscopic guidance. At this time, methylmethacrylate mixture was reconstituted in the Kyphon bone mixing device system. This was then loaded into the delivery mechanism, attached to Kyphon bone fillers. The balloons were deflated and removed followed by the instillation of methylmethacrylate mixture with excellent filling in the AP and lateral projections. The working cannulae and the bone filler were then retrieved and removed. Multiple spot radiographic images were obtained in various obliquities. Hemostasis was achieved with manual compression. The patient tolerated the procedure well without immediate postprocedural complication. FINDINGS: Completion images demonstrate a technically excellent result with adequate cement filling of the T12 vertebral body on both the AP and lateral projections. No extravasation was noted in the disk spaces or posteriorly into the spinal canal. No epidural venous contamination was seen. IMPRESSION: Technically successful T12 vertebral body biopsy, ablation and cement augmentation using balloon kyphoplasty. PLAN: The patient will be seen for clinical follow-up at the interventional radiology clinic in 2-4 weeks. Signed, Criselda Peaches, MD [facility] Radiology Electronically Signed   By: Jacqulynn Cadet M.D.   On: 11/12/2021 15:57   ECHOCARDIOGRAM COMPLETE  Result  Date: 11/09/2021    ECHOCARDIOGRAM REPORT   Patient Name:   Daisy [name] Date of Exam: 10/27/2021 Medical Rec #:  [id number]            Height:       67.0 in Accession #:    [id number]           Weight:       340.0 lb Date of Birth:  08-18-1950            BSA:          2.534 m Patient Age:    37 years             BP:           110/48 mmHg Patient Gender: F                    HR:           103 bpm. Exam Location:  Inpatient Procedure: 2D Echo, Cardiac Doppler, Color Doppler and Intracardiac            Opacification Agent Indications:     CHF-acute diastolic  History:         Patient has prior history of Echocardiogram examinations, most                  recent 10/18/2019. CHF; Risk Factors:Hypertension, Current Smoker                  and Dyslipidemia. GERD. CKD.  Sonographer:     Clayton Lefort RDCS (AE) Referring Phys:  [id number] Orma Flaming Diagnosing Phys: Rex Kras DO  Sonographer Comments: Technically challenging study due to limited acoustic windows, Technically difficult study due to poor echo windows, suboptimal parasternal window, suboptimal apical window, suboptimal subcostal window and patient is morbidly obese.  Image acquisition challenging due to patient body habitus. IMPRESSIONS  1. Technically difficult study.  2. Left ventricular ejection fraction, by estimation, is 55 to 60%. The left ventricle has normal function. The left ventricle has no regional wall motion abnormalities. Left ventricular diastolic parameters were normal.  3. Right ventricular systolic function is mildly reduced. The right ventricular size is normal.  4. Right atrial size was grossly  normal.  5. The mitral valve is grossly normal. No evidence of mitral valve regurgitation. No evidence of mitral stenosis.  6. The aortic valve was not well visualized. Aortic valve regurgitation is not visualized. No aortic stenosis is present.  7. There is mild dilatation of the ascending aorta, measuring 38 mm.  8. The inferior vena cava is  dilated in size with >50% respiratory variability, suggesting right atrial pressure of 8 mmHg. Comparison(s): A prior study was performed on 10/18/2019. LVEF 41-66%, diastolic parameters indeterminate, RV systolic function normal and size not well visualized, trivial MR, dialted IVC, estimated RAP 53mmHG. FINDINGS  Left Ventricle: Left ventricular ejection fraction, by estimation, is 55 to 60%. The left ventricle has normal function. The left ventricle has no regional wall motion abnormalities. Definity contrast agent was given IV to delineate the left ventricular  endocardial borders. The left ventricular internal cavity size was normal in size. There is no left ventricular hypertrophy. Left ventricular diastolic parameters were normal. Right Ventricle: The right ventricular size is normal. No increase in right ventricular wall thickness. Right ventricular systolic function is mildly reduced. Left Atrium: Left atrial size was normal in size. Right Atrium: Right atrial size was grossly normal. Pericardium: There is no evidence of pericardial effusion. Mitral Valve: The mitral valve is grossly normal. No evidence of mitral valve regurgitation. No evidence of mitral valve stenosis. Tricuspid Valve: The tricuspid valve is grossly normal. Tricuspid valve regurgitation is trivial. No evidence of tricuspid stenosis. Aortic Valve: The aortic valve was not well visualized. Aortic valve regurgitation is not visualized. No aortic stenosis is present. Aortic valve mean gradient measures 3.0 mmHg. Aortic valve peak gradient measures 6.2 mmHg. Aortic valve area, by VTI measures 2.15 cm. Pulmonic Valve: The pulmonic valve was not well visualized. Pulmonic valve regurgitation is not visualized. Aorta: The aortic root is normal in size and structure. There is mild dilatation of the ascending aorta, measuring 38 mm. Venous: The inferior vena cava is dilated in size with greater than 50% respiratory variability, suggesting right  atrial pressure of 8 mmHg. IAS/Shunts: The interatrial septum was not well visualized.  LEFT VENTRICLE PLAX 2D LVIDd:         3.60 cm   Diastology LVIDs:         2.70 cm   LV e' medial:    6.00 cm/s LV PW:         1.30 cm   LV E/e' medial:  9.0 LV IVS:        1.60 cm   LV e' lateral:   5.00 cm/s LVOT diam:     2.40 cm   LV E/e' lateral: 10.8 LV SV:         50 LV SV Index:   20 LVOT Area:     4.52 cm  IVC IVC diam: 2.70 cm LEFT ATRIUM           Index LA diam:      3.50 cm 1.38 cm/m LA Vol (A4C): 51.3 ml 20.25 ml/m  AORTIC VALVE AV Area (Vmax):    2.11 cm AV Area (Vmean):   2.33 cm AV Area (VTI):     2.15 cm AV Vmax:           125.00 cm/s AV Vmean:          83.800 cm/s AV VTI:            0.231 m AV Peak Grad:      6.2 mmHg AV Mean Grad:  3.0 mmHg LVOT Vmax:         58.20 cm/s LVOT Vmean:        43.100 cm/s LVOT VTI:          0.110 m LVOT/AV VTI ratio: 0.48  AORTA Ao Root diam: 3.50 cm Ao Asc diam:  3.80 cm MV E velocity: 54.00 cm/s MV A velocity: 80.00 cm/s  SHUNTS MV E/A ratio:  0.68        Systemic VTI:  0.11 m                            Systemic Diam: 2.40 cm Sunit Tolia DO Electronically signed by Rex Kras DO Signature Date/Time: 11/09/2021/3:40:39 PM    Final     Microbiology: Results for orders placed or performed during the hospital encounter of 11/05/21  MRSA Next Gen by PCR, Nasal     Status: None   Collection Time: 11/07/21  9:26 AM   Specimen: Nasal Mucosa; Nasal Swab  Result Value Ref Range Status   MRSA by PCR Next Gen NOT DETECTED NOT DETECTED Final    Comment: (NOTE) The GeneXpert MRSA Assay (FDA approved for NASAL specimens only), is one component of a comprehensive MRSA colonization surveillance program. It is not intended to diagnose MRSA infection nor to guide or monitor treatment for MRSA infections. Test performance is not FDA approved in patients less than 64 years old. Performed at [facility], [place], [place] [address]   Respiratory (~20  pathogens) panel by PCR     Status: None   Collection Time: 11/07/21  6:51 PM   Specimen: Nasopharyngeal Swab; Respiratory  Result Value Ref Range Status   Adenovirus NOT DETECTED NOT DETECTED Final   Coronavirus 229E NOT DETECTED NOT DETECTED Final    Comment: (NOTE) The Coronavirus on the Respiratory Panel, DOES NOT test for the novel  Coronavirus (2019 nCoV)    Coronavirus HKU1 NOT DETECTED NOT DETECTED Final   Coronavirus NL63 NOT DETECTED NOT DETECTED Final   Coronavirus OC43 NOT DETECTED NOT DETECTED Final   Metapneumovirus NOT DETECTED NOT DETECTED Final   Rhinovirus / Enterovirus NOT DETECTED NOT DETECTED Final   Influenza A NOT DETECTED NOT DETECTED Final   Influenza B NOT DETECTED NOT DETECTED Final   Parainfluenza Virus 1 NOT DETECTED NOT DETECTED Final   Parainfluenza Virus 2 NOT DETECTED NOT DETECTED Final   Parainfluenza Virus 3 NOT DETECTED NOT DETECTED Final   Parainfluenza Virus 4 NOT DETECTED NOT DETECTED Final   Respiratory Syncytial Virus NOT DETECTED NOT DETECTED Final   Bordetella pertussis NOT DETECTED NOT DETECTED Final   Bordetella Parapertussis NOT DETECTED NOT DETECTED Final   Chlamydophila pneumoniae NOT DETECTED NOT DETECTED Final   Mycoplasma pneumoniae NOT DETECTED NOT DETECTED Final    Comment: Performed at [facility], [place]. [address], [place], [place] [address]  Gastrointestinal Panel by PCR , Stool     Status: None   Collection Time: 11/08/21  1:02 AM   Specimen: Stool  Result Value Ref Range Status   Campylobacter species NOT DETECTED NOT DETECTED Final   Plesimonas shigelloides NOT DETECTED NOT DETECTED Final   Salmonella species NOT DETECTED NOT DETECTED Final   Yersinia enterocolitica NOT DETECTED NOT DETECTED Final   Vibrio species NOT DETECTED NOT DETECTED Final   Vibrio cholerae NOT DETECTED NOT DETECTED Final   Enteroaggregative E coli (EAEC) NOT DETECTED NOT DETECTED Final  Enteropathogenic E coli (EPEC) NOT DETECTED NOT  DETECTED Final   Enterotoxigenic E coli (ETEC) NOT DETECTED NOT DETECTED Final   Shiga like toxin producing E coli (STEC) NOT DETECTED NOT DETECTED Final   Shigella/Enteroinvasive E coli (EIEC) NOT DETECTED NOT DETECTED Final   Cryptosporidium NOT DETECTED NOT DETECTED Final   Cyclospora cayetanensis NOT DETECTED NOT DETECTED Final   Entamoeba histolytica NOT DETECTED NOT DETECTED Final   Giardia lamblia NOT DETECTED NOT DETECTED Final   Adenovirus F40/41 NOT DETECTED NOT DETECTED Final   Astrovirus NOT DETECTED NOT DETECTED Final   Norovirus GI/GII NOT DETECTED NOT DETECTED Final   Rotavirus A NOT DETECTED NOT DETECTED Final   Sapovirus (I, II, IV, and V) NOT DETECTED NOT DETECTED Final    Comment: Performed at [facility], [address] [place], [place] [address]  C Difficile Quick Screen w PCR reflex     Status: None   Collection Time: 11/08/21  1:03 AM   Specimen: STOOL  Result Value Ref Range Status   C Diff antigen NEGATIVE NEGATIVE Final   C Diff toxin NEGATIVE NEGATIVE Final   C Diff interpretation No C. difficile detected.  Final    Comment: Performed at [facility], [address] [place], [place] [address]  Culture, blood (single) w Reflex to ID Panel     Status: None   Collection Time: 11/14/21 10:01 AM   Specimen: BLOOD  Result Value Ref Range Status   Specimen Description BLOOD BLOOD RIGHT FOREARM  Final   Special Requests   Final    BOTTLES DRAWN AEROBIC AND ANAEROBIC Blood Culture adequate volume   Culture   Final    NO GROWTH 5 DAYS Performed at [facility], [address] [place], [place] [address]    Report Status 11/19/2021 FINAL  Final  Urine Culture     Status: Abnormal   Collection Time: 11/14/21  1:30 PM   Specimen: Urine, Clean Catch  Result Value Ref Range Status   Specimen Description   Final    URINE, CLEAN CATCH Performed at [facility], [address] [place], [place] [address]    Special Requests    Final    NONE Performed at [facility], [address] [place], [place] [address]    Culture (A)  Final    >=100,000 COLONIES/mL PSEUDOMONAS AERUGINOSA 40,000 COLONIES/mL ENTEROCOCCUS FAECALIS    Report Status 11/18/2021 FINAL  Final   Organism ID, Bacteria PSEUDOMONAS AERUGINOSA (A)  Final   Organism ID, Bacteria ENTEROCOCCUS FAECALIS (A)  Final      Susceptibility   Enterococcus faecalis - MIC*    AMPICILLIN <=2 SENSITIVE Sensitive     NITROFURANTOIN <=16 SENSITIVE Sensitive     VANCOMYCIN 2 SENSITIVE Sensitive     * 40,000 COLONIES/mL ENTEROCOCCUS FAECALIS   Pseudomonas aeruginosa - MIC*    CEFTAZIDIME 4 SENSITIVE Sensitive     CIPROFLOXACIN INTERMEDIATE Intermediate     GENTAMICIN 2 SENSITIVE Sensitive     IMIPENEM 2 SENSITIVE Sensitive     PIP/TAZO <=4 SENSITIVE Sensitive     CEFEPIME 2 SENSITIVE Sensitive     * >=100,000 COLONIES/mL PSEUDOMONAS AERUGINOSA    Labs: CBC: Recent Labs  Lab 11/14/21 0537 11/15/21 0534 11/16/21 0418 11/17/21 0528 11/18/21 0349  WBC 17.1* 18.1* 16.0* 14.4* 13.0*  HGB 14.8 15.0 14.6 13.8 13.6  HCT 48.2* 48.1* 46.3* 44.3 44.2  MCV 97.4 96.6 97.7 97.4 97.6  PLT 156 126* 129* 125* 141*  Basic Metabolic Panel: Recent Labs  Lab 11/13/21 0535 11/14/21 0537 11/15/21 0534 11/16/21 0418 11/18/21 0349  NA 140 140 141 139 141  K 4.2 4.4 3.9 4.3 3.8  CL 90* 94* 96* 94* 98  CO2 38* 38* 35* 36* 33*  GLUCOSE 117* 126* 135* 133* 111*  BUN 27* 28* 25* 24* 25*  CREATININE 1.59* 1.51* 1.18* 1.11* 1.34*  CALCIUM 10.2 10.1 10.0 10.0 9.4  MG  --   --   --   --  2.2   Liver Function Tests: No results for input(s): AST, ALT, ALKPHOS, BILITOT, PROT, ALBUMIN in the last 168 hours. CBG: No results for input(s): GLUCAP in the last 168 hours.  Discharge time spent: greater than 30 minutes.  Signed: Ezekiel Slocumb, DO [facility] 11/19/2021

## 2021-11-19 NOTE — Progress Notes (Signed)
Patient being discharged home with Hospice. IV removed. Went over discharge instructions and medications with patients daughters. They agreed they understood and all questions were answered. Patient will be transported home via EMS.

## 2021-11-19 NOTE — Progress Notes (Signed)
Daily Progress Note   Patient Name: Daisy [name]       Date: 11/19/2021 DOB: 08-13-1950  Age: 71 y.o. MRN#: [id number] Attending Physician: Ezekiel Slocumb, DO Primary Care Physician: Lesleigh Noe, MD Admit Date: 11/05/2021  Reason for Consultation/Follow-up: Establishing goals of care  Subjective: [name] is awake and alert - eating. She continues to be confused. Family is working with hospice to get patient home- waiting on equipment delivery. Some agitation this morning, otherwise no complaints.  Family discussed incontinence- that she is aware when she needs to go- but unable to control it. We discussed placing a foley- but ultimately decided against it due to patient just recovering from a UTI and the burden of cleaning her is not too great at this point. We discussed a foley may be helpful in the future if she begins to retain urine or if the burden of cleaning her after urinating is too difficult or causing skin damage.   Length of Stay: 13  Current Medications: Scheduled Meds:   dexamethasone  2 mg Oral Daily   [START ON 11/20/2021] FLUoxetine  20 mg Oral Daily   pantoprazole  20 mg Oral Daily   senna  1 tablet Oral QHS   sodium chloride flush  3 mL Intravenous Q12H   [START ON 11/20/2021] torsemide  20 mg Oral Daily    Continuous Infusions:  sodium chloride      PRN Meds: sodium chloride, acetaminophen, cyclobenzaprine, HYDROcodone-acetaminophen, ipratropium-albuterol, loperamide, LORazepam, LORazepam, morphine, ondansetron **OR** ondansetron (ZOFRAN) IV, sodium chloride flush  Physical Exam Constitutional:      General: She is not in acute distress. Pulmonary:     Effort: Pulmonary effort is normal.  Skin:    General: Skin is warm and dry.  Psychiatric:     Comments:  Pleasantly confused            Vital Signs: BP (!) 157/83 (BP Location: Right Arm)   Pulse (!) 102   Temp 98.3 F (36.8 C)   Resp 20   Ht 5\' 7"  (1.702 m)   Wt 136 kg   SpO2 93%   BMI 46.96 kg/m  SpO2: SpO2: 93 % O2 Device: O2 Device: Nasal Cannula O2 Flow Rate: O2 Flow Rate (L/min): 4 L/min  Intake/output summary:  Intake/Output Summary (Last 24 hours) at 11/19/2021 1343 Last data filed at 11/18/2021 1800 Gross per 24 hour  Intake 200 ml  Output --  Net 200 ml    LBM: Last BM Date : 11/18/21 Baseline Weight: Weight: (!) 144.7 kg Most recent weight: Weight: 136 kg       Palliative Assessment/Data: PPS 30%    Flowsheet Rows    Flowsheet Row Most Recent Value  Intake Tab   Referral Department Hospitalist  Unit at Time of Referral Med/Surg Unit  Palliative Care Primary Diagnosis Cancer  Date Notified 11/13/21  Palliative Care Type New Palliative care  Reason for referral Clarify Goals of Care  Date of Admission 11/05/21  Date first seen by Palliative Care 11/14/21  # of days Palliative referral response time 1 Day(s)  # of days IP prior to Palliative referral 8  Clinical Assessment   Psychosocial & Spiritual Assessment  Palliative Care Outcomes        Patient Active Problem List   Diagnosis Date Noted   Hospice care patient 11/18/2021   DNR (do not resuscitate) 36/46/8032   Acute metabolic encephalopathy 06/08/8249   Leukocytosis 11/14/2021   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 11/06/2021   Metastatic primary lung cancer, left ([place]) 11/06/2021   Acute on chronic diastolic CHF (congestive heart failure) ([place]) 10/26/2021   Acute hypoxemic respiratory failure ([place]) 10/26/2021   possible pneumonia 10/26/2021   Pleural effusion on left 10/25/2021   Moderate episode of recurrent major depressive disorder ([place]) 12/24/2020   Generalized anxiety disorder 12/24/2020   Dysuria 03/26/2020   Chronic bilateral low back pain without sciatica 03/26/2020    Well woman exam without gynecological exam 03/26/2020   Chronic pain of both knees 03/26/2020   Edema 01/27/2020   Morbid obesity with BMI of 50.0-59.9, adult ([place]) 01/04/2020   Prediabetes 01/04/2020   HLD (hyperlipidemia) 01/04/2020   Chronic diastolic heart failure ([place]) 01/04/2020   COPD with chronic bronchitis and emphysema ([place]) 11/30/2019   Chronic respiratory failure with hypoxia and hypercapnia ([place]) 11/04/2019   Acute on chronic congestive heart failure ([place]) 10/17/2019   Essential hypertension 10/17/2019   UTI (urinary tract infection)    Malignant neoplasm of upper-outer quadrant of left breast in female, estrogen receptor positive ([place]) 09/30/2016   PAD (peripheral artery disease) ([place]) 04/04/2014   Obesity hypoventilation syndrome ([place]) 04/04/2014   GERD (gastroesophageal reflux disease) 04/04/2014    Palliative Care Assessment & Plan   HPI: 71 y.o. female  with past medical history of  breast cancer, GERD, arthritis, chronic diastolic congestive heart failure, chronic kidney disease, depression, hyperlipidemia, morbid obesity, and peripheral vascular disease  admitted on 11/05/2021 with back pain.  Found to have T12 compression fracture.  Then found to have mass in lung with liver, adrenal and bone mets.  Patient had kyphoplasty done 5/30.  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 PMT consulted to discuss goals of care.  Assessment: -New metastatic cancer diagnosis- likely lung primary- mets to liver, bones, adrenal glands, brain  Recommendations/Plan: Plan for home with hospice, symptom management Symptom management-  Brain mets with edema, bone mets- dexamethasone 2mg  po daily Pain, SOB-  morphine liquid 10mg /mL- 2.5 mg po q2hr as needed Anxiety, agitation- lorazepam 1mg  po q4hours prn  Code Status: DNR  Care plan was discussed with patient's daughters [name] and [name]  Thank you for allowing the Palliative  Medicine Team to assist in the care of this patient.  Mariana Kaufman, AGNP-C Palliative Medicine  Please call Palliative Medicine team phone with any questions [phone]. For individual providers please see AMION.

## 2021-11-19 NOTE — Assessment & Plan Note (Signed)
DNR form signed and in chart.

## 2021-11-19 NOTE — Progress Notes (Addendum)
[facility])   Plan is for patient to discharge today once DME arrives. As of 11:10a, DME has not arrived.  Addendum: 1:13p Per daughter/Jennifer, DME set to arrive @1 :30p. [place] IDT notified of above information.   Addendum: 1:33p  Transport has been arranged for 3:45p with [facility] by MSW to allot time for RN/Debra P. to complete DC tasks. Patient to return to:  [address]  [place] [address]   If applicable, please send signed and completed DNR with patient/family upon discharge. Please provide prescriptions at discharge as needed to ensure ongoing symptom management and a transport packet.   [facility] information and contact numbers given to family and above information shared with TOC.    Please call with any questions/concerns.    Thank you for the opportunity to participate in this patient's care   Daphene Calamity, MSW [facility] Liaison  [phone]

## 2021-11-21 ENCOUNTER — Other Ambulatory Visit: Payer: Medicare PPO

## 2021-11-21 NOTE — Progress Notes (Signed)
Case not discussed, patient has opted for Hospice Care.  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

## 2021-11-22 ENCOUNTER — Other Ambulatory Visit: Payer: Self-pay | Admitting: *Deleted

## 2021-11-22 MED ORDER — MORPHINE SULFATE (CONCENTRATE) 20 MG/ML PO SOLN: 5.0000 mg | ORAL | 0 refills | Status: AC | PRN

## 2021-11-22 NOTE — Telephone Encounter (Signed)
Call from [place] asking for change on MS to 20 mg/ml as patient is decline rapidly and will be unable to swallow 5 ml of medicine.

## 2021-12-05 ENCOUNTER — Other Ambulatory Visit: Payer: Self-pay | Admitting: Family Medicine

## 2021-12-05 DIAGNOSIS — F331 Major depressive disorder, recurrent, moderate: Secondary | ICD-10-CM

## 2021-12-05 DIAGNOSIS — F411 Generalized anxiety disorder: Secondary | ICD-10-CM

## 2021-12-14 DEATH — deceased

## 2021-12-30 ENCOUNTER — Other Ambulatory Visit: Payer: Medicare PPO

## 2021-12-30 ENCOUNTER — Ambulatory Visit: Payer: Medicare PPO | Admitting: Hematology and Oncology

## 2021-12-31 ENCOUNTER — Ambulatory Visit: Payer: Medicare PPO | Admitting: Hematology and Oncology

## 2021-12-31 ENCOUNTER — Other Ambulatory Visit: Payer: Medicare PPO

## 2022-01-07 ENCOUNTER — Other Ambulatory Visit: Payer: Medicare PPO

## 2022-01-07 ENCOUNTER — Ambulatory Visit: Payer: Medicare PPO | Admitting: Hematology and Oncology

## 2022-03-18 ENCOUNTER — Ambulatory Visit: Payer: Medicare PPO

## 2022-10-03 IMAGING — MR MR HEAD WO/W CM
12 series · 48 of 48 positions shown · IV contrast (gadavist)
Comparison: Prior CT from 10/18/2019.

CLINICAL DATA: Initial evaluation for small cell lung cancer,
staging.

EXAM:
MRI HEAD WITHOUT AND WITH CONTRAST
TECHNIQUE: Multiplanar, multiecho pulse sequences of the brain and surrounding
structures were obtained without and with intravenous contrast.
CONTRAST:  10mL GADAVIST GADOBUTROL 1 MMOL/ML IV SOLN

[Series 2: ax dwi_tracew · axial · 3.0mm · 1.31mm/px · z∈[-70,+84]mm · 6 of 48 slices shown]
[im 1/48]
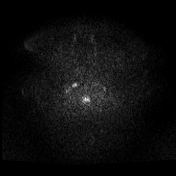
[im 10/48]
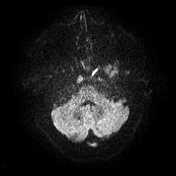
[im 19/48]
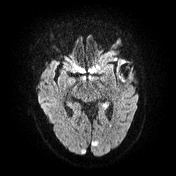
[im 29/48]
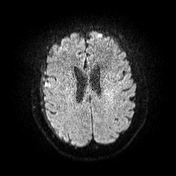
[im 38/48]
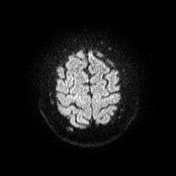
[im 48/48]
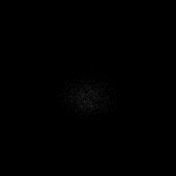

[Series 3: ax dwi_adc · axial · 3.0mm · 1.31mm/px · z∈[-70,+84]mm · 6 of 48 slices shown]
[im 1/48]
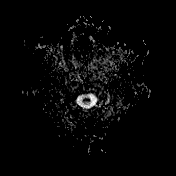
[im 10/48]
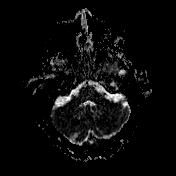
[im 19/48]
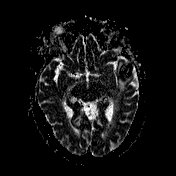
[im 29/48]
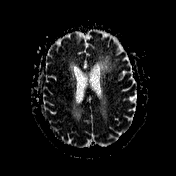
[im 38/48]
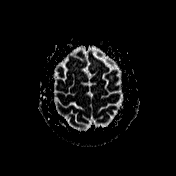
[im 48/48]
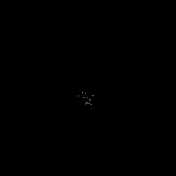

[Series 4: cor dwi_tracew · coronal · 5.0mm · 1.31mm/px · 5 of 38 slices shown]
[im 1/38]
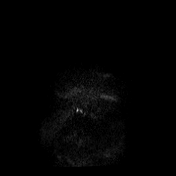
[im 10/38]
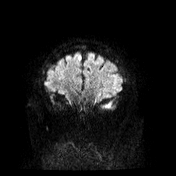
[im 19/38]
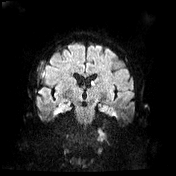
[im 28/38]
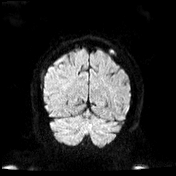
[im 38/38]
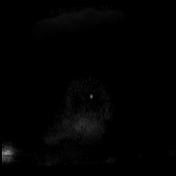

[Series 5: cor dwi_adc · coronal · 5.0mm · 1.31mm/px · 5 of 38 slices shown]
[im 1/38]
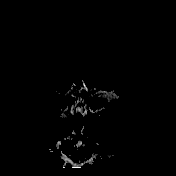
[im 10/38]
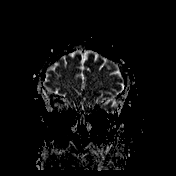
[im 19/38]
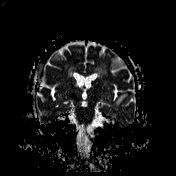
[im 28/38]
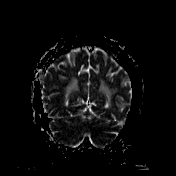
[im 38/38]
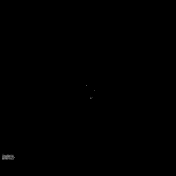

[Series 6: T1 · sagittal · 5.0mm · 0.94mm/px · 3 of 23 slices shown (1 of 2)]
[im 1/23]
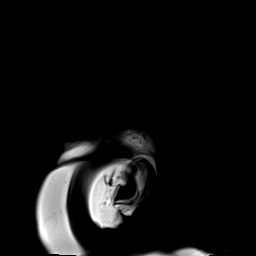
[im 12/23]
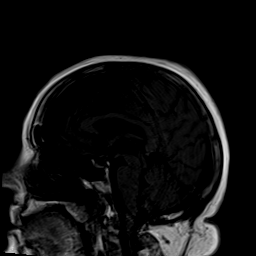
[im 23/23]
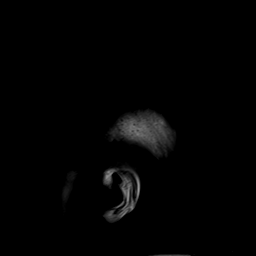

[Series 7: T2 · axial · 5.0mm · 0.45mm/px · z∈[-73,+88]mm · 3 of 28 slices shown]
[im 1/28]
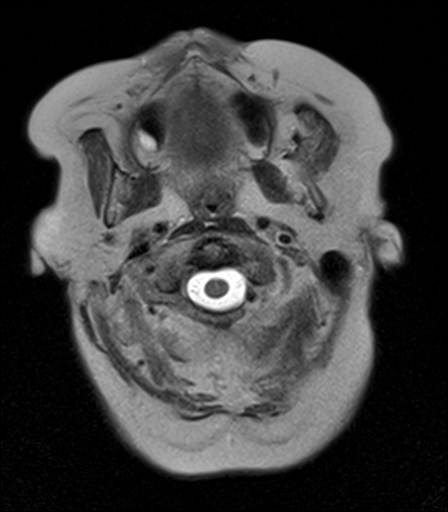
[im 14/28]
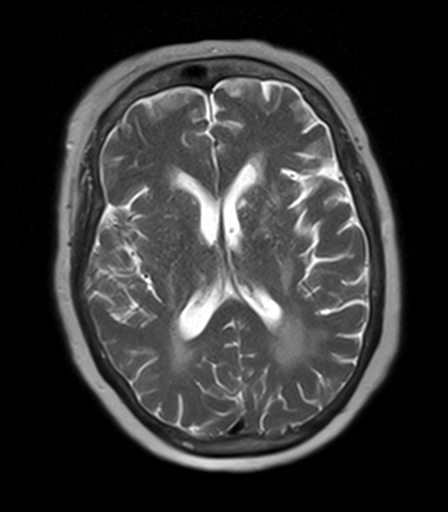
[im 28/28]
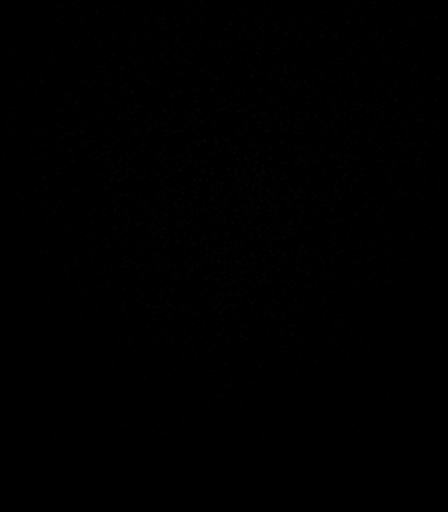

[Series 8: T2-star · axial · 5.0mm · 0.45mm/px · z∈[-73,+88]mm · 3 of 28 slices shown]
[im 1/28]
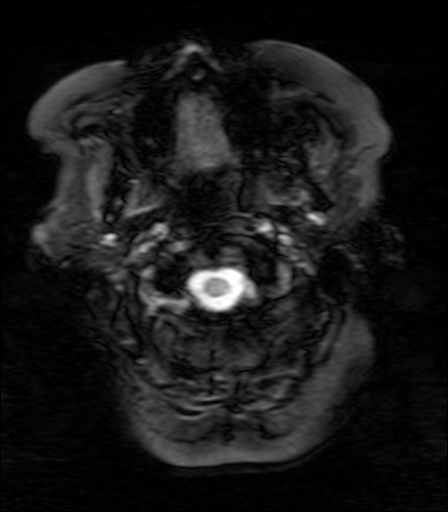
[im 14/28]
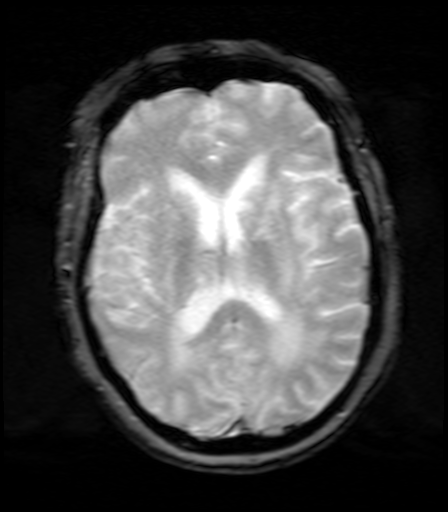
[im 28/28]
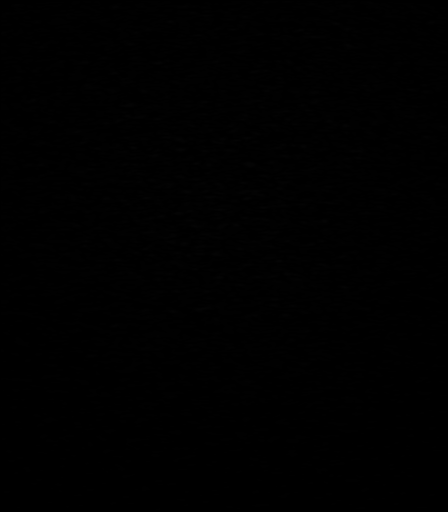

[Series 9: FLAIR · axial · 5.0mm · 1.20mm/px · z∈[-73,+88]mm · 3 of 28 slices shown]
[im 1/28]
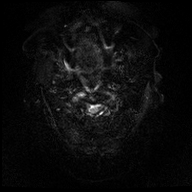
[im 14/28]
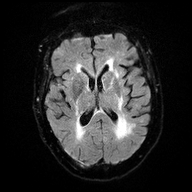
[im 28/28]
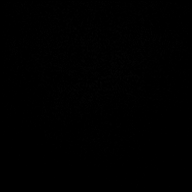

[Series 10: T1 · axial · 5.0mm · 0.90mm/px · z∈[-73,+88]mm · 3 of 28 slices shown (2 of 2)]
[im 1/28]
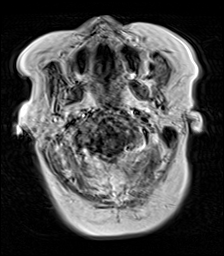
[im 14/28]
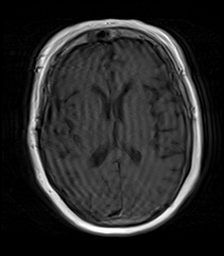
[im 28/28]
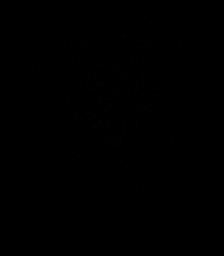

[Series 11: T2 post-contrast · coronal · 5.0mm · 0.45mm/px · 4 of 30 slices shown]
[im 1/30]
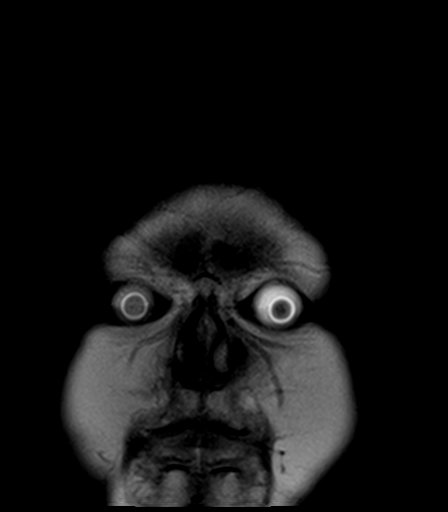
[im 10/30]
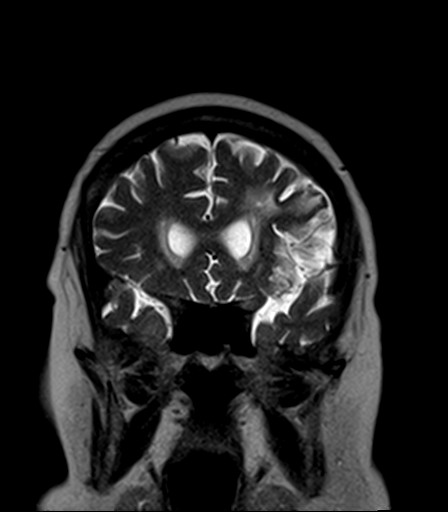
[im 20/30]
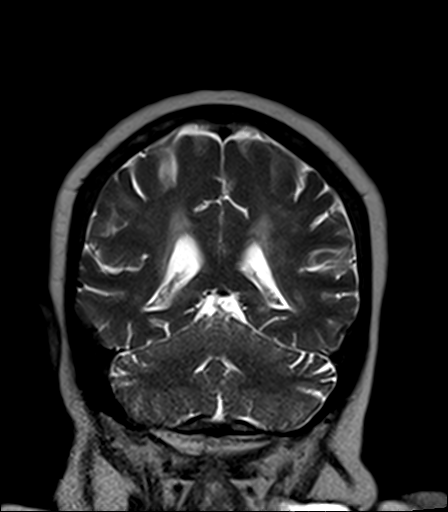
[im 30/30]
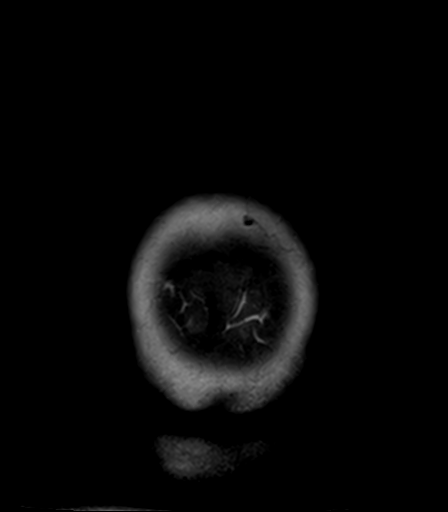

[Series 12: T1 post-contrast · axial · 5.0mm · 0.90mm/px · z∈[-73,+88]mm · 3 of 28 slices shown (1 of 2)]
[im 1/28]
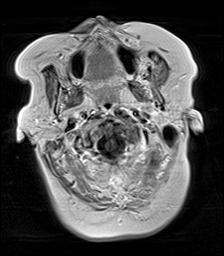
[im 14/28]
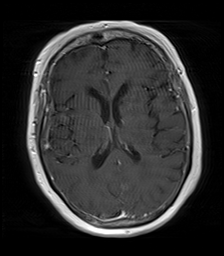
[im 28/28]
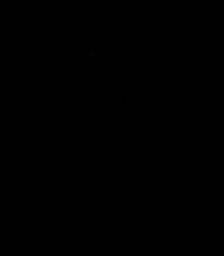

[Series 13: T1 post-contrast · coronal · 5.0mm · 0.90mm/px · 4 of 30 slices shown (2 of 2)]
[im 1/30]
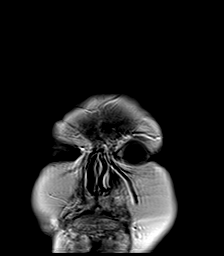
[im 10/30]
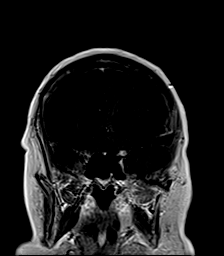
[im 20/30]
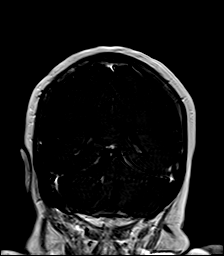
[im 30/30]
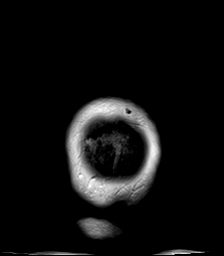

[48 of 48 positions shown; findings below may reference images not displayed]

FINDINGS: Brain: Examination moderately degraded by motion artifact.

Cerebral volume within normal limits. Patchy and confluent T2/FLAIR
hyperintensity involving the periventricular and deep white matter
both cerebral hemispheres, most consistent with chronic small vessel
ischemic disease, moderately advanced in nature. Small remote
lacunar infarcts at the left caudate.

There are innumerable scattered metastatic lesions seen throughout
both cerebral hemispheres. For reference purposes, the largest
lesion is seen at the anterior left temporal lobe in measures 1.7 x
2.2 cm (series 7, image 11). This lesion demonstrates prominent
susceptibility artifact, and could reflect parenchymal hemorrhage
related to an underlying lesion. Mild localized edema without
significant regional mass effect. Mild susceptibility artifacts seen
about a few additional lesions elsewhere within the brain,
suggesting blood products. The largest lesion within the right
cerebral hemisphere is seen at the right frontal operculum and
measures 9 mm (series 13, image 16). Asymmetric dural thickening and
enhancement noted overlying the right cerebral convexity. There is
at least 1 infratentorial lesion at the central cerebellum measuring
7 mm (series 12, image 6). No other significant regional mass effect
or midline shift.

No acute vascular territory infarct. No midline shift or
hydrocephalus. No extra-axial fluid collection. Pituitary gland
suprasellar region normal.

Vascular: Major intracranial vascular flow voids are maintained.

Skull and upper cervical spine: Craniocervical junction within
normal limits. Innumerable scattered osseous metastatic lesions also
noted throughout the calvarium and visualized skull base, most
evident on diffusion-weighted sequences. No significant or visible
extra osseous component seen on this motion degraded exam.

Sinuses/Orbits: Globes and orbital soft tissues within normal
limits. Paranasal sinuses are largely clear. No mastoid effusion.

Other: None.
IMPRESSION: 1. Innumerable intracranial metastatic lesions as above. Note made
of a 2.2 cm lesion/area at the anterior left temporal lobe with
associated susceptibility artifact and localized edema, suggesting
associated hemorrhage and/or hemorrhagic metastasis. Correlation
with dedicated noncontrast head CT could be performed for further
evaluation as warranted.
2. Widespread osseous metastatic disease throughout the calvarium
and visualized skull base.
3. Underlying moderately advanced chronic microvascular ischemic
disease.
